# Patient Record
Sex: Female | Born: 1935 | Race: White | Hispanic: No | Smoking: Former smoker
Health system: Southern US, Community
[De-identification: ages and names within clinical notes are randomized; demographics above are authoritative.]

## PROBLEM LIST (undated history)

## (undated) DIAGNOSIS — B029 Zoster without complications: Secondary | ICD-10-CM

## (undated) DIAGNOSIS — M1990 Unspecified osteoarthritis, unspecified site: Secondary | ICD-10-CM

## (undated) DIAGNOSIS — J984 Other disorders of lung: Secondary | ICD-10-CM

## (undated) DIAGNOSIS — C50919 Malignant neoplasm of unspecified site of unspecified female breast: Secondary | ICD-10-CM

## (undated) DIAGNOSIS — S129XXA Fracture of neck, unspecified, initial encounter: Secondary | ICD-10-CM

## (undated) DIAGNOSIS — IMO0002 Reserved for concepts with insufficient information to code with codable children: Secondary | ICD-10-CM

## (undated) DIAGNOSIS — R351 Nocturia: Secondary | ICD-10-CM

## (undated) DIAGNOSIS — I10 Essential (primary) hypertension: Secondary | ICD-10-CM

## (undated) DIAGNOSIS — I739 Peripheral vascular disease, unspecified: Secondary | ICD-10-CM

## (undated) DIAGNOSIS — R32 Unspecified urinary incontinence: Secondary | ICD-10-CM

## (undated) DIAGNOSIS — J189 Pneumonia, unspecified organism: Secondary | ICD-10-CM

## (undated) DIAGNOSIS — I82402 Acute embolism and thrombosis of unspecified deep veins of left lower extremity: Secondary | ICD-10-CM

## (undated) DIAGNOSIS — R350 Frequency of micturition: Secondary | ICD-10-CM

## (undated) DIAGNOSIS — C189 Malignant neoplasm of colon, unspecified: Secondary | ICD-10-CM

## (undated) DIAGNOSIS — M8580 Other specified disorders of bone density and structure, unspecified site: Secondary | ICD-10-CM

## (undated) DIAGNOSIS — C73 Malignant neoplasm of thyroid gland: Secondary | ICD-10-CM

## (undated) DIAGNOSIS — E785 Hyperlipidemia, unspecified: Secondary | ICD-10-CM

## (undated) DIAGNOSIS — G629 Polyneuropathy, unspecified: Secondary | ICD-10-CM

## (undated) DIAGNOSIS — E039 Hypothyroidism, unspecified: Secondary | ICD-10-CM

## (undated) HISTORY — DX: Malignant neoplasm of thyroid gland: C73

## (undated) HISTORY — DX: Unspecified osteoarthritis, unspecified site: M19.90

## (undated) HISTORY — DX: Hyperlipidemia, unspecified: E78.5

## (undated) HISTORY — DX: Other specified disorders of bone density and structure, unspecified site: M85.80

## (undated) HISTORY — PX: OTHER SURGICAL HISTORY: SHX169

## (undated) HISTORY — PX: THYROIDECTOMY: SHX17

## (undated) HISTORY — DX: Malignant neoplasm of colon, unspecified: C18.9

## (undated) HISTORY — DX: Zoster without complications: B02.9

## (undated) HISTORY — DX: Polyneuropathy, unspecified: G62.9

## (undated) HISTORY — PX: BREAST ENHANCEMENT SURGERY: SHX7

## (undated) HISTORY — DX: Reserved for concepts with insufficient information to code with codable children: IMO0002

## (undated) HISTORY — DX: Acute embolism and thrombosis of unspecified deep veins of left lower extremity: I82.402

## (undated) HISTORY — PX: BREAST SURGERY: SHX581

## (undated) HISTORY — PX: CATARACT EXTRACTION: SUR2

---

## 1964-11-05 DIAGNOSIS — C73 Malignant neoplasm of thyroid gland: Secondary | ICD-10-CM

## 1964-11-05 HISTORY — DX: Malignant neoplasm of thyroid gland: C73

## 1966-11-05 HISTORY — PX: TOTAL ABDOMINAL HYSTERECTOMY W/ BILATERAL SALPINGOOPHORECTOMY: SHX83

## 1975-11-06 DIAGNOSIS — C189 Malignant neoplasm of colon, unspecified: Secondary | ICD-10-CM

## 1975-11-06 HISTORY — PX: COLON RESECTION: SHX5231

## 1975-11-06 HISTORY — DX: Malignant neoplasm of colon, unspecified: C18.9

## 1993-11-05 HISTORY — PX: CERVICAL LAMINECTOMY: SHX94

## 1998-03-07 ENCOUNTER — Ambulatory Visit (HOSPITAL_BASED_OUTPATIENT_CLINIC_OR_DEPARTMENT_OTHER): Admission: RE | Admit: 1998-03-07 | Discharge: 1998-03-07 | Payer: Self-pay | Admitting: General Surgery

## 1998-10-19 ENCOUNTER — Other Ambulatory Visit: Admission: RE | Admit: 1998-10-19 | Discharge: 1998-10-19 | Payer: Self-pay | Admitting: *Deleted

## 1998-11-03 ENCOUNTER — Ambulatory Visit (HOSPITAL_COMMUNITY): Admission: RE | Admit: 1998-11-03 | Discharge: 1998-11-03 | Payer: Self-pay | Admitting: *Deleted

## 1999-03-13 ENCOUNTER — Ambulatory Visit (HOSPITAL_COMMUNITY): Admission: RE | Admit: 1999-03-13 | Discharge: 1999-03-13 | Payer: Self-pay | Admitting: Gastroenterology

## 1999-05-25 ENCOUNTER — Other Ambulatory Visit: Admission: RE | Admit: 1999-05-25 | Discharge: 1999-05-25 | Payer: Self-pay

## 1999-06-28 ENCOUNTER — Ambulatory Visit (HOSPITAL_COMMUNITY): Admission: RE | Admit: 1999-06-28 | Discharge: 1999-06-28 | Payer: Self-pay | Admitting: *Deleted

## 1999-10-26 ENCOUNTER — Other Ambulatory Visit: Admission: RE | Admit: 1999-10-26 | Discharge: 1999-10-26 | Payer: Self-pay | Admitting: *Deleted

## 1999-11-16 ENCOUNTER — Ambulatory Visit (HOSPITAL_COMMUNITY): Admission: RE | Admit: 1999-11-16 | Discharge: 1999-11-16 | Payer: Self-pay | Admitting: *Deleted

## 2000-12-10 ENCOUNTER — Encounter: Payer: Self-pay | Admitting: Internal Medicine

## 2000-12-10 ENCOUNTER — Ambulatory Visit (HOSPITAL_COMMUNITY): Admission: RE | Admit: 2000-12-10 | Discharge: 2000-12-10 | Payer: Self-pay | Admitting: Internal Medicine

## 2001-04-01 ENCOUNTER — Ambulatory Visit (HOSPITAL_COMMUNITY): Admission: RE | Admit: 2001-04-01 | Discharge: 2001-04-01 | Payer: Self-pay | Admitting: Gastroenterology

## 2002-05-21 ENCOUNTER — Ambulatory Visit (HOSPITAL_COMMUNITY): Admission: RE | Admit: 2002-05-21 | Discharge: 2002-05-21 | Payer: Self-pay | Admitting: Internal Medicine

## 2002-05-21 ENCOUNTER — Encounter: Payer: Self-pay | Admitting: Internal Medicine

## 2004-04-13 ENCOUNTER — Ambulatory Visit (HOSPITAL_COMMUNITY): Admission: RE | Admit: 2004-04-13 | Discharge: 2004-04-13 | Payer: Self-pay | Admitting: Internal Medicine

## 2005-04-16 ENCOUNTER — Ambulatory Visit (HOSPITAL_COMMUNITY): Admission: RE | Admit: 2005-04-16 | Discharge: 2005-04-16 | Payer: Self-pay | Admitting: Internal Medicine

## 2005-04-23 ENCOUNTER — Encounter: Admission: RE | Admit: 2005-04-23 | Discharge: 2005-04-23 | Payer: Self-pay | Admitting: Internal Medicine

## 2006-04-25 ENCOUNTER — Ambulatory Visit (HOSPITAL_COMMUNITY): Admission: RE | Admit: 2006-04-25 | Discharge: 2006-04-25 | Payer: Self-pay | Admitting: Internal Medicine

## 2007-01-06 ENCOUNTER — Ambulatory Visit (HOSPITAL_COMMUNITY): Admission: RE | Admit: 2007-01-06 | Discharge: 2007-01-06 | Payer: Self-pay | Admitting: Internal Medicine

## 2009-10-20 ENCOUNTER — Emergency Department (HOSPITAL_COMMUNITY): Admission: EM | Admit: 2009-10-20 | Discharge: 2009-10-20 | Payer: Self-pay | Admitting: Emergency Medicine

## 2010-07-19 ENCOUNTER — Encounter: Admission: RE | Admit: 2010-07-19 | Discharge: 2010-07-19 | Payer: Self-pay | Admitting: Internal Medicine

## 2010-11-26 ENCOUNTER — Encounter: Payer: Self-pay | Admitting: Internal Medicine

## 2011-01-18 ENCOUNTER — Emergency Department (HOSPITAL_COMMUNITY): Payer: Medicare Other

## 2011-01-18 ENCOUNTER — Emergency Department (HOSPITAL_COMMUNITY)
Admission: EM | Admit: 2011-01-18 | Discharge: 2011-01-18 | Disposition: A | Discharge status: Home / Self Care | Payer: Medicare Other | Attending: Emergency Medicine | Admitting: Emergency Medicine

## 2011-01-18 DIAGNOSIS — M25559 Pain in unspecified hip: Secondary | ICD-10-CM | POA: Insufficient documentation

## 2011-01-18 DIAGNOSIS — S42209B Unspecified fracture of upper end of unspecified humerus, initial encounter for open fracture: Secondary | ICD-10-CM | POA: Insufficient documentation

## 2011-01-18 DIAGNOSIS — E860 Dehydration: Secondary | ICD-10-CM | POA: Insufficient documentation

## 2011-01-18 DIAGNOSIS — I10 Essential (primary) hypertension: Secondary | ICD-10-CM | POA: Insufficient documentation

## 2011-01-18 DIAGNOSIS — W010XXA Fall on same level from slipping, tripping and stumbling without subsequent striking against object, initial encounter: Secondary | ICD-10-CM | POA: Insufficient documentation

## 2011-01-18 DIAGNOSIS — S9000XA Contusion of unspecified ankle, initial encounter: Secondary | ICD-10-CM | POA: Insufficient documentation

## 2011-01-18 DIAGNOSIS — E7800 Pure hypercholesterolemia, unspecified: Secondary | ICD-10-CM | POA: Insufficient documentation

## 2011-01-18 DIAGNOSIS — Y92009 Unspecified place in unspecified non-institutional (private) residence as the place of occurrence of the external cause: Secondary | ICD-10-CM | POA: Insufficient documentation

## 2011-01-18 LAB — CK: Total CK: 79 U/L (ref 7–177)

## 2011-01-18 LAB — BASIC METABOLIC PANEL
CO2: 25 mEq/L (ref 19–32)
Calcium: 9 mg/dL (ref 8.4–10.5)
GFR calc Af Amer: 58 mL/min — ABNORMAL LOW (ref >60)
Glucose, Bld: 135 mg/dL — ABNORMAL HIGH (ref 70–99)

## 2011-01-18 LAB — DIFFERENTIAL
Eosinophils Absolute: 0 10*3/uL (ref 0.0–0.7)
Monocytes Relative: 13 % — ABNORMAL HIGH (ref 3–12)
Neutrophils Relative %: 69 % (ref 43–77)

## 2011-01-18 LAB — URINALYSIS, ROUTINE W REFLEX MICROSCOPIC
Hgb urine dipstick: NEGATIVE
Protein, ur: 30 mg/dL — AB
Specific Gravity, Urine: 1.025 (ref 1.005–1.030)

## 2011-01-18 LAB — URINE MICROSCOPIC-ADD ON

## 2011-01-18 LAB — CBC
MCH: 29.8 pg (ref 26.0–34.0)
MCV: 88.9 fL (ref 78.0–100.0)
Platelets: 324 10*3/uL (ref 150–400)

## 2011-03-23 NOTE — Procedures (Signed)
[place]  Patient:    Cassandra Walker, Cassandra Walker                      MRN: [id number] Proc. Date: 04/01/01 Adm. Date:  [id number] Attending:  Orland Mustard CC:         Marinus Maw, M.D.   Procedure Report  PROCEDURE PERFORMED:  Colonoscopy.  ENDOSCOPIST:  Llana Aliment. Randa Evens, M.D.  MEDICATIONS USED:  Fentanyl 40 mcg, Versed 4 mg IV.  INSTRUMENT:  Olympus pediatric video colonoscope.  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  She has also had adenomatous polyps.  She was due for a three-year follow-up next year but had a complete physical exam with stool positive for blood.  For this reason, colonoscopy was performed.  DESCRIPTION OF PROCEDURE:  The procedure had been explained to the patient and consent obtained.  With the patient in the left lateral decubitus position, the Olympus video colonoscope was inserted and advanced under direct visualization.  The patient had an anastomosis in the high rectum that was somewhat a side-to-side anastomosis and took some time to pass.  Once we were able pass this, we were able to advance easily to the cecum.  The ileocecal valve and appendiceal orifice were seen.  The scope was withdrawn.  The cecum, ascending colon, hepatic flexure, transverse colon, splenic flexure, descending and sigmoid colon were seen well upon withdrawal.  No polyps or other lesions were seen.  The anastomosis was normal.  The rectum was free of polyps or other lesions.  The patient tolerated the procedure well. Maintained on low flow oxygen and pulse oximeter throughout the procedure with no obvious problem.  ASSESSMENT: 1. Heme positive stool. 2. No evidence of further polyps.  PLAN:  Will recommend repeating in three years.  Will see back in the office in three months and will check her stool. DD:  04/01/01 TD:  04/01/01 Job:  [id number]

## 2011-06-28 ENCOUNTER — Other Ambulatory Visit: Payer: Self-pay | Admitting: Internal Medicine

## 2011-06-28 DIAGNOSIS — R9389 Abnormal findings on diagnostic imaging of other specified body structures: Secondary | ICD-10-CM

## 2011-07-02 ENCOUNTER — Ambulatory Visit
Admission: RE | Admit: 2011-07-02 | Discharge: 2011-07-02 | Disposition: A | Discharge status: Home / Self Care | Payer: Medicare Other | Source: Ambulatory Visit | Attending: Internal Medicine | Admitting: Internal Medicine

## 2011-07-02 DIAGNOSIS — R9389 Abnormal findings on diagnostic imaging of other specified body structures: Secondary | ICD-10-CM

## 2011-07-02 MED ORDER — IOHEXOL 300 MG/ML  SOLN: 75.0000 mL | Freq: Once | INTRAMUSCULAR | Status: AC | PRN

## 2011-07-23 ENCOUNTER — Encounter: Payer: Self-pay | Admitting: Pulmonary Disease

## 2011-07-24 ENCOUNTER — Encounter: Payer: Self-pay | Admitting: Pulmonary Disease

## 2011-07-24 ENCOUNTER — Ambulatory Visit (INDEPENDENT_AMBULATORY_CARE_PROVIDER_SITE_OTHER): Payer: Medicare Other | Admitting: Pulmonary Disease

## 2011-07-24 VITALS — BP 140/72 | HR 69 | Temp 98.0°F | Ht 65.5 in | Wt 161.6 lb

## 2011-07-24 DIAGNOSIS — R911 Solitary pulmonary nodule: Secondary | ICD-10-CM

## 2011-07-24 DIAGNOSIS — J984 Other disorders of lung: Secondary | ICD-10-CM

## 2011-07-24 DIAGNOSIS — R918 Other nonspecific abnormal finding of lung field: Secondary | ICD-10-CM | POA: Insufficient documentation

## 2011-07-24 NOTE — Assessment & Plan Note (Signed)
The patient has a groundglass density in the superior segment of the right lower lobe that has slightly enlarged from last year.  It is unclear whether this is an inflammatory focus, possible infection such as MAC, or whether this may be a slow-growing malignancy.  She did smoke one pack per day for 20 years.  At this point, I think she needs a PET scan for better evaluation, followed by bronchoscopy.  I have discussed all of this with the patient, and she is agreeable to the plan outlined.

## 2011-07-24 NOTE — Patient Instructions (Signed)
Will schedule for PET scan, and will call with the results After the PET, will arrange for bronchoscopy to further evaluate the abnormality in your lung.

## 2011-07-24 NOTE — Progress Notes (Signed)
  Subjective:    Patient ID: Cassandra Walker, female    DOB: Oct 10, 1932, 77 y.o.   MRN: [id number]  HPI The pt is a 77y/o female who I have been asked to see for an abnormal ct chest.  She had a ct chest in 2011 which showed an ill-defined area of groundglass opacity along the superior segment of the right lower lobe bronchus.  The patient had a bronchitis at that time, and once this resolved did not feel that she needed a followup.  She has had a followup scan this year, but it appears the area in question has grown slightly.  There are no other new findings.  The patient denies any cough or congestion at this time, no chest pain, no anorexia or weight loss, and no hemoptysis.  She has no history of TB exposure, and has had a negative PPD in the past.  She has never lived in [place] or [place].  She does have a history of colon cancer with resection in 1977, as well as a thyroidectomy for thyroid cancer in 1968.  She tells me there has been no evidence for recurrence over this time.  She also tells me that her health maintenance is completely up to date.   Review of Systems  Constitutional: Negative for fever and unexpected weight change.  HENT: Negative for ear pain, nosebleeds, congestion, sore throat, rhinorrhea, sneezing, trouble swallowing, dental problem, postnasal drip and sinus pressure.   Eyes: Negative for redness and itching.  Respiratory: Positive for shortness of breath. Negative for cough, chest tightness and wheezing.   Cardiovascular: Negative for palpitations and leg swelling.  Gastrointestinal: Negative for nausea and vomiting.  Genitourinary: Negative for dysuria.  Musculoskeletal: Negative for joint swelling.  Skin: Negative for rash.  Neurological: Negative for headaches.  Hematological: Does not bruise/bleed easily.  Psychiatric/Behavioral: Negative for dysphoric mood. The patient is not nervous/anxious.        Objective:   Physical Exam Constitutional:  Well  developed, no acute distress  HENT:  Nares patent without discharge  Oropharynx without exudate, palate and uvula are normal  Eyes:  Perrla, eomi, no scleral icterus  Neck:  No JVD, no TMG  Cardiovascular:  Normal rate, regular rhythm, no rubs or gallops.  No murmurs        Intact distal pulses  Pulmonary :  Normal breath sounds, no stridor or respiratory distress   No rales, rhonchi, or wheezing  Abdominal:  Soft, nondistended, bowel sounds present.  No tenderness noted.   Musculoskeletal:  No lower extremity edema noted.  Lymph Nodes:  No cervical lymphadenopathy noted  Skin:  No cyanosis noted  Neurologic:  Alert, appropriate, moves all 4 extremities without obvious deficit.         Assessment & Plan:

## 2011-07-30 ENCOUNTER — Encounter (HOSPITAL_COMMUNITY)
Admission: RE | Admit: 2011-07-30 | Discharge: 2011-07-30 | Disposition: A | Discharge status: Home / Self Care | Payer: Medicare Other | Source: Ambulatory Visit | Attending: Pulmonary Disease | Admitting: Pulmonary Disease

## 2011-07-30 DIAGNOSIS — R911 Solitary pulmonary nodule: Secondary | ICD-10-CM

## 2011-07-30 DIAGNOSIS — J984 Other disorders of lung: Secondary | ICD-10-CM | POA: Insufficient documentation

## 2011-07-30 MED ORDER — FLUDEOXYGLUCOSE F - 18 (FDG) INJECTION
18.3000 | Freq: Once | INTRAVENOUS | Status: AC | PRN
  Administered 2011-07-30: 18.3 via INTRAVENOUS

## 2011-08-07 ENCOUNTER — Telehealth: Payer: Self-pay | Admitting: Pulmonary Disease

## 2011-08-07 NOTE — Telephone Encounter (Signed)
Called and spoke with Cassandra Walker at [employer].  Pt scheduled for bronch 08/14/11 at 7:30 am.    Called and spoke with pt.  Pt aware of appt date and time of bronch, to arrive at 6:15am at [facility] outpt admitting, NPO after midnight, no aleve 1 day prior to procedure, and will need someone with her to drive her home.  Pt verbalized understanding and denied any questions.    Also mailed her the bronch paper with all this info in it.

## 2011-08-07 NOTE — Telephone Encounter (Signed)
Megan, please arrange bronch for this pt for next week.  Let me know. Thanks.

## 2011-08-14 ENCOUNTER — Other Ambulatory Visit (HOSPITAL_COMMUNITY): Payer: Self-pay | Admitting: Pulmonary Disease

## 2011-08-14 ENCOUNTER — Other Ambulatory Visit: Payer: Self-pay | Admitting: Pulmonary Disease

## 2011-08-14 ENCOUNTER — Ambulatory Visit (HOSPITAL_COMMUNITY)
Admission: RE | Admit: 2011-08-14 | Discharge: 2011-08-14 | Disposition: A | Discharge status: Home / Self Care | Payer: Medicare Other | Source: Ambulatory Visit | Attending: Pulmonary Disease | Admitting: Pulmonary Disease

## 2011-08-14 DIAGNOSIS — R9389 Abnormal findings on diagnostic imaging of other specified body structures: Secondary | ICD-10-CM

## 2011-08-14 DIAGNOSIS — R222 Localized swelling, mass and lump, trunk: Secondary | ICD-10-CM | POA: Insufficient documentation

## 2011-08-14 DIAGNOSIS — J984 Other disorders of lung: Secondary | ICD-10-CM | POA: Insufficient documentation

## 2011-08-15 ENCOUNTER — Other Ambulatory Visit: Payer: Self-pay | Admitting: Pulmonary Disease

## 2011-08-15 DIAGNOSIS — R911 Solitary pulmonary nodule: Secondary | ICD-10-CM

## 2011-08-16 LAB — CULTURE, RESPIRATORY W GRAM STAIN

## 2011-08-20 NOTE — Op Note (Signed)
  [name], [name] NO.:  [id number]  MEDICAL RECORD NO.:  [id number]  LOCATION:  RESP                         FACILITY:  [facility]  PHYSICIAN:  Barbaraann Share, MD,FCCPDATE OF BIRTH:  Aug 13, 1932  DATE OF PROCEDURE:  08/14/2011 DATE OF DISCHARGE:                              OPERATIVE REPORT   PROCEDURE:  Flexible fiberoptic bronchoscopy with video.  INDICATION FOR THE PROCEDURE:  Right lower lobe nodular mass and also right upper lobe abnormality of unknown origin.  OPERATOR:  Barbaraann Share, MD,FCCP  ANESTHESIA:  Versed 7 mg IV, Demerol 50 mg IV, topical 1% lidocaine on vocal cords and airways during the procedure.  DESCRIPTION:  After obtaining informed consent and under close cardiopulmonary monitoring, the above preop anesthesia was given.  The fiberoptic scope was passed to the right naris and into the posterior pharynx where there was no lesions or other abnormalities seen.  The vocal cords appeared to be within normal limits and moved bilaterally on phonation.  The scope was then passed into the trachea where it was examined along its entire length down to the level of the carina, all of which appeared normal.  The left tracheobronchial tree was examined to the subsegmental level with no endobronchial abnormality being found. Attention was then paid to the right tracheobronchial tree where the right upper lobe, right middle lobe, and right lower lobe bronchi all appeared patent and without abnormality.  Attention was then paid to the superior segment of the right lower lobe where bronchial brushes, bronchial alveolar lavage, as well as transbronchial biopsies were done with good material being obtained.  The scope was then passed into the right upper lobe bronchus and bronchial brushes were taken from the apical segment of the right upper lobe without complications.  The patient did well with the procedure with no complications noted.  Chest x-ray  was pending at time of dictation to rule out pneumothorax post transbronchial lung biopsies.     Barbaraann Share, MD,FCCP     [facility]/MEDQ  D:  08/14/2011  T:  08/14/2011  Job:  [id number]  Electronically Signed by Marcelyn Bruins MDFCCP on 08/20/2011 01:21:54 PM

## 2011-09-10 LAB — FUNGUS CULTURE W SMEAR

## 2012-01-17 ENCOUNTER — Other Ambulatory Visit (HOSPITAL_COMMUNITY): Payer: Self-pay | Admitting: Internal Medicine

## 2012-01-17 ENCOUNTER — Ambulatory Visit (HOSPITAL_COMMUNITY)
Admission: RE | Admit: 2012-01-17 | Discharge: 2012-01-17 | Disposition: A | Discharge status: Home / Self Care | Payer: Medicare Other | Source: Ambulatory Visit | Attending: Internal Medicine | Admitting: Internal Medicine

## 2012-01-17 DIAGNOSIS — M25559 Pain in unspecified hip: Secondary | ICD-10-CM

## 2012-01-17 DIAGNOSIS — M545 Low back pain, unspecified: Secondary | ICD-10-CM | POA: Insufficient documentation

## 2012-01-17 DIAGNOSIS — M2550 Pain in unspecified joint: Secondary | ICD-10-CM | POA: Insufficient documentation

## 2012-08-12 ENCOUNTER — Other Ambulatory Visit (HOSPITAL_COMMUNITY): Payer: Self-pay | Admitting: Internal Medicine

## 2012-08-12 ENCOUNTER — Ambulatory Visit (HOSPITAL_COMMUNITY)
Admission: RE | Admit: 2012-08-12 | Discharge: 2012-08-12 | Disposition: A | Discharge status: Home / Self Care | Payer: Medicare Other | Source: Ambulatory Visit | Attending: Internal Medicine | Admitting: Internal Medicine

## 2012-08-12 DIAGNOSIS — R071 Chest pain on breathing: Secondary | ICD-10-CM

## 2012-08-12 DIAGNOSIS — I10 Essential (primary) hypertension: Secondary | ICD-10-CM | POA: Insufficient documentation

## 2012-08-18 ENCOUNTER — Telehealth: Payer: Self-pay | Admitting: Pulmonary Disease

## 2012-08-18 NOTE — Telephone Encounter (Signed)
Called # listed and off is closed. Will call back in the AM

## 2012-08-19 NOTE — Telephone Encounter (Signed)
I spoke with Cassandra Walker and advised her we have not seen pt since 08/2011 after her bronch. Nothing further was needed

## 2012-10-16 ENCOUNTER — Ambulatory Visit
Admission: RE | Admit: 2012-10-16 | Discharge: 2012-10-16 | Disposition: A | Discharge status: Home / Self Care | Payer: Medicare Other | Source: Ambulatory Visit | Attending: Internal Medicine | Admitting: Internal Medicine

## 2012-10-16 ENCOUNTER — Other Ambulatory Visit: Payer: Self-pay | Admitting: Internal Medicine

## 2012-10-16 DIAGNOSIS — M542 Cervicalgia: Secondary | ICD-10-CM

## 2013-03-07 ENCOUNTER — Emergency Department (HOSPITAL_COMMUNITY): Payer: Medicare Other

## 2013-03-07 ENCOUNTER — Encounter (HOSPITAL_COMMUNITY): Payer: Self-pay | Admitting: Anesthesiology

## 2013-03-07 ENCOUNTER — Inpatient Hospital Stay (HOSPITAL_COMMUNITY)
Admission: EM | Admit: 2013-03-07 | Discharge: 2013-03-17 | DRG: 030 | Disposition: A | Discharge status: Rehabilitation Facility | Payer: Medicare Other | Attending: General Surgery | Admitting: General Surgery

## 2013-03-07 ENCOUNTER — Encounter (HOSPITAL_COMMUNITY): Admission: EM | Disposition: A | Discharge status: Rehabilitation Facility | Payer: Self-pay | Source: Home / Self Care

## 2013-03-07 ENCOUNTER — Emergency Department (HOSPITAL_COMMUNITY): Payer: Medicare Other | Admitting: Anesthesiology

## 2013-03-07 ENCOUNTER — Inpatient Hospital Stay (HOSPITAL_COMMUNITY): Payer: Medicare Other

## 2013-03-07 ENCOUNTER — Encounter (HOSPITAL_COMMUNITY): Payer: Self-pay

## 2013-03-07 DIAGNOSIS — G8254 Quadriplegia, C5-C7 incomplete: Secondary | ICD-10-CM | POA: Diagnosis present

## 2013-03-07 DIAGNOSIS — I10 Essential (primary) hypertension: Secondary | ICD-10-CM | POA: Diagnosis present

## 2013-03-07 DIAGNOSIS — Y999 Unspecified external cause status: Secondary | ICD-10-CM

## 2013-03-07 DIAGNOSIS — IMO0002 Reserved for concepts with insufficient information to code with codable children: Principal | ICD-10-CM | POA: Diagnosis present

## 2013-03-07 DIAGNOSIS — S129XXA Fracture of neck, unspecified, initial encounter: Secondary | ICD-10-CM

## 2013-03-07 DIAGNOSIS — S12500A Unspecified displaced fracture of sixth cervical vertebra, initial encounter for closed fracture: Secondary | ICD-10-CM

## 2013-03-07 DIAGNOSIS — E039 Hypothyroidism, unspecified: Secondary | ICD-10-CM | POA: Diagnosis present

## 2013-03-07 DIAGNOSIS — R339 Retention of urine, unspecified: Secondary | ICD-10-CM | POA: Diagnosis present

## 2013-03-07 DIAGNOSIS — S14109A Unspecified injury at unspecified level of cervical spinal cord, initial encounter: Secondary | ICD-10-CM

## 2013-03-07 DIAGNOSIS — E876 Hypokalemia: Secondary | ICD-10-CM | POA: Diagnosis present

## 2013-03-07 DIAGNOSIS — S14121A Central cord syndrome at C1 level of cervical spinal cord, initial encounter: Secondary | ICD-10-CM | POA: Diagnosis present

## 2013-03-07 HISTORY — PX: POSTERIOR CERVICAL FUSION/FORAMINOTOMY: SHX5038

## 2013-03-07 HISTORY — DX: Peripheral vascular disease, unspecified: I73.9

## 2013-03-07 LAB — COMPREHENSIVE METABOLIC PANEL
Albumin: 3.8 g/dL (ref 3.5–5.2)
Albumin: 3.2 g/dL — ABNORMAL LOW (ref 3.5–5.2)
Alkaline Phosphatase: 61 U/L (ref 39–117)
BUN: 29 mg/dL — ABNORMAL HIGH (ref 6–23)
BUN: 25 mg/dL — ABNORMAL HIGH (ref 6–23)
Calcium: 9.1 mg/dL (ref 8.4–10.5)
GFR calc Af Amer: 42 mL/min — ABNORMAL LOW (ref >90)
Glucose, Bld: 130 mg/dL — ABNORMAL HIGH (ref 70–99)
Potassium: 3.8 mEq/L (ref 3.5–5.1)
Sodium: 137 mEq/L (ref 135–145)
Total Protein: 6.9 g/dL (ref 6.0–8.3)
Total Protein: 6.2 g/dL (ref 6.0–8.3)

## 2013-03-07 LAB — CBC WITH DIFFERENTIAL/PLATELET
Basophils Absolute: 0 10*3/uL (ref 0.0–0.1)
Basophils Relative: 0 % (ref 0–1)
Eosinophils Absolute: 0 10*3/uL (ref 0.0–0.7)
HCT: 35.5 % — ABNORMAL LOW (ref 36.0–46.0)
Hemoglobin: 12.4 g/dL (ref 12.0–15.0)
MCH: 29.2 pg (ref 26.0–34.0)
MCHC: 34.9 g/dL (ref 30.0–36.0)
Monocytes Absolute: 1.4 10*3/uL — ABNORMAL HIGH (ref 0.1–1.0)
Neutro Abs: 16.6 10*3/uL — ABNORMAL HIGH (ref 1.7–7.7)
Neutrophils Relative %: 82 % — ABNORMAL HIGH (ref 43–77)
RDW: 14.4 % (ref 11.5–15.5)

## 2013-03-07 LAB — CBC
MCHC: 34.5 g/dL (ref 30.0–36.0)
RDW: 14.5 % (ref 11.5–15.5)

## 2013-03-07 LAB — MRSA PCR SCREENING: MRSA by PCR: NEGATIVE

## 2013-03-07 LAB — PROTIME-INR: Prothrombin Time: 13 seconds (ref 11.6–15.2)

## 2013-03-07 SURGERY — POSTERIOR CERVICAL FUSION/FORAMINOTOMY LEVEL 1
Anesthesia: General | Site: Neck | Wound class: Clean

## 2013-03-07 MED ORDER — ONDANSETRON HCL 4 MG/2ML IJ SOLN
4.0000 mg | Freq: Four times a day (QID) | INTRAMUSCULAR | Status: DC | PRN
  Administered 2013-03-07 – 2013-03-11 (×9): 4 mg via INTRAVENOUS
  Filled 2013-03-07 (×9): qty 2

## 2013-03-07 MED ORDER — SODIUM CHLORIDE 0.9 % IV SOLN
INTRAVENOUS | Status: AC
  Filled 2013-03-07: qty 500

## 2013-03-07 MED ORDER — PANTOPRAZOLE SODIUM 40 MG PO TBEC
40.0000 mg | DELAYED_RELEASE_TABLET | Freq: Every day | ORAL | Status: DC
  Administered 2013-03-11 – 2013-03-15 (×5): 40 mg via ORAL
  Filled 2013-03-07 (×5): qty 1

## 2013-03-07 MED ORDER — PANTOPRAZOLE SODIUM 40 MG IV SOLR
40.0000 mg | Freq: Every day | INTRAVENOUS | Status: DC
  Administered 2013-03-08 – 2013-03-10 (×3): 40 mg via INTRAVENOUS
  Filled 2013-03-07 (×6): qty 40

## 2013-03-07 MED ORDER — BIOTENE DRY MOUTH MT LIQD
15.0000 mL | Freq: Two times a day (BID) | OROMUCOSAL | Status: DC
  Administered 2013-03-08 – 2013-03-12 (×8): 15 mL via OROMUCOSAL

## 2013-03-07 MED ORDER — LIDOCAINE-EPINEPHRINE 1 %-1:100000 IJ SOLN
INTRAMUSCULAR | Status: DC | PRN
  Administered 2013-03-07: 10 mL via INTRADERMAL

## 2013-03-07 MED ORDER — POTASSIUM CHLORIDE IN NACL 20-0.9 MEQ/L-% IV SOLN
INTRAVENOUS | Status: DC
  Administered 2013-03-07 – 2013-03-10 (×5): via INTRAVENOUS
  Administered 2013-03-11: 1000 mL via INTRAVENOUS
  Administered 2013-03-12 – 2013-03-14 (×3): via INTRAVENOUS
  Filled 2013-03-07 (×14): qty 1000

## 2013-03-07 MED ORDER — LACTATED RINGERS IV SOLN
INTRAVENOUS | Status: DC | PRN
  Administered 2013-03-07 (×2): via INTRAVENOUS

## 2013-03-07 MED ORDER — EPHEDRINE SULFATE 50 MG/ML IJ SOLN
INTRAMUSCULAR | Status: DC | PRN
  Administered 2013-03-07: 10 mg via INTRAVENOUS

## 2013-03-07 MED ORDER — VECURONIUM BROMIDE 10 MG IV SOLR
INTRAVENOUS | Status: DC | PRN
  Administered 2013-03-07: 3 mg via INTRAVENOUS
  Administered 2013-03-07: 10 mg via INTRAVENOUS
  Administered 2013-03-07: 1 mg via INTRAVENOUS

## 2013-03-07 MED ORDER — HYDROMORPHONE HCL PF 1 MG/ML IJ SOLN
0.2500 mg | INTRAMUSCULAR | Status: DC | PRN
  Administered 2013-03-07 (×3): 0.5 mg via INTRAVENOUS

## 2013-03-07 MED ORDER — ONDANSETRON HCL 4 MG/2ML IJ SOLN
INTRAMUSCULAR | Status: DC | PRN
  Administered 2013-03-07: 4 mg via INTRAVENOUS

## 2013-03-07 MED ORDER — SUCCINYLCHOLINE CHLORIDE 20 MG/ML IJ SOLN
INTRAMUSCULAR | Status: DC | PRN
  Administered 2013-03-07: 100 mg via INTRAVENOUS

## 2013-03-07 MED ORDER — HYDROMORPHONE HCL PF 1 MG/ML IJ SOLN
INTRAMUSCULAR | Status: AC
  Filled 2013-03-07: qty 1

## 2013-03-07 MED ORDER — ONDANSETRON HCL 4 MG/2ML IJ SOLN: 4.0000 mg | Freq: Four times a day (QID) | INTRAMUSCULAR | Status: DC | PRN

## 2013-03-07 MED ORDER — SODIUM CHLORIDE 0.9 % IR SOLN
Status: DC | PRN
  Administered 2013-03-07: 18:00:00

## 2013-03-07 MED ORDER — LEVOTHYROXINE SODIUM 100 MCG IV SOLR
50.0000 ug | Freq: Every day | INTRAVENOUS | Status: DC
  Administered 2013-03-08 – 2013-03-09 (×2): 50 ug via INTRAVENOUS
  Filled 2013-03-07 (×4): qty 5

## 2013-03-07 MED ORDER — CEFAZOLIN SODIUM-DEXTROSE 2-3 GM-% IV SOLR
INTRAVENOUS | Status: AC
  Administered 2013-03-07: 2 g via INTRAVENOUS
  Filled 2013-03-07: qty 50

## 2013-03-07 MED ORDER — GLYCOPYRROLATE 0.2 MG/ML IJ SOLN
INTRAMUSCULAR | Status: DC | PRN
  Administered 2013-03-07: 0.6 mg via INTRAVENOUS

## 2013-03-07 MED ORDER — CEFAZOLIN SODIUM 1-5 GM-% IV SOLN
1.0000 g | Freq: Three times a day (TID) | INTRAVENOUS | Status: DC
  Administered 2013-03-07 – 2013-03-12 (×14): 1 g via INTRAVENOUS
  Filled 2013-03-07 (×16): qty 50

## 2013-03-07 MED ORDER — OXYCODONE HCL 5 MG PO TABS: 5.0000 mg | ORAL_TABLET | Freq: Once | ORAL | Status: DC | PRN

## 2013-03-07 MED ORDER — DEXAMETHASONE SODIUM PHOSPHATE 4 MG/ML IJ SOLN
INTRAMUSCULAR | Status: DC | PRN
  Administered 2013-03-07: 10 mg via INTRAVENOUS

## 2013-03-07 MED ORDER — LIDOCAINE HCL (CARDIAC) 20 MG/ML IV SOLN
INTRAVENOUS | Status: DC | PRN
  Administered 2013-03-07: 80 mg via INTRAVENOUS

## 2013-03-07 MED ORDER — 0.9 % SODIUM CHLORIDE (POUR BTL) OPTIME
TOPICAL | Status: DC | PRN
  Administered 2013-03-07: 1000 mL

## 2013-03-07 MED ORDER — THROMBIN 20000 UNITS EX KIT
PACK | CUTANEOUS | Status: DC | PRN
  Administered 2013-03-07: 18:00:00 via TOPICAL

## 2013-03-07 MED ORDER — ONDANSETRON HCL 4 MG PO TABS
4.0000 mg | ORAL_TABLET | Freq: Four times a day (QID) | ORAL | Status: DC | PRN
  Administered 2013-03-17: 4 mg via ORAL
  Filled 2013-03-07: qty 1

## 2013-03-07 MED ORDER — PNEUMOCOCCAL VAC POLYVALENT 25 MCG/0.5ML IJ INJ
0.5000 mL | INJECTION | INTRAMUSCULAR | Status: AC
  Filled 2013-03-07: qty 0.5

## 2013-03-07 MED ORDER — MORPHINE SULFATE 2 MG/ML IJ SOLN
1.0000 mg | INTRAMUSCULAR | Status: DC | PRN
  Administered 2013-03-09 – 2013-03-11 (×4): 1 mg via INTRAVENOUS
  Filled 2013-03-07 (×5): qty 1

## 2013-03-07 MED ORDER — MORPHINE SULFATE 4 MG/ML IJ SOLN
4.0000 mg | INTRAMUSCULAR | Status: DC | PRN
  Administered 2013-03-12 – 2013-03-13 (×6): 4 mg via INTRAVENOUS
  Filled 2013-03-07 (×6): qty 1

## 2013-03-07 MED ORDER — PROPOFOL 10 MG/ML IV BOLUS
INTRAVENOUS | Status: DC | PRN
  Administered 2013-03-07: 150 mg via INTRAVENOUS

## 2013-03-07 MED ORDER — THROMBIN 5000 UNITS EX SOLR
OROMUCOSAL | Status: DC | PRN
  Administered 2013-03-07: 19:00:00 via TOPICAL

## 2013-03-07 MED ORDER — MORPHINE SULFATE 2 MG/ML IJ SOLN
2.0000 mg | INTRAMUSCULAR | Status: DC | PRN
  Administered 2013-03-07 – 2013-03-14 (×9): 2 mg via INTRAVENOUS
  Filled 2013-03-07 (×8): qty 1

## 2013-03-07 MED ORDER — FENTANYL CITRATE 0.05 MG/ML IJ SOLN
INTRAMUSCULAR | Status: DC | PRN
  Administered 2013-03-07: 150 ug via INTRAVENOUS
  Administered 2013-03-07: 100 ug via INTRAVENOUS

## 2013-03-07 MED ORDER — DEXAMETHASONE SODIUM PHOSPHATE 4 MG/ML IJ SOLN
8.0000 mg | Freq: Four times a day (QID) | INTRAMUSCULAR | Status: DC
  Administered 2013-03-07 – 2013-03-08 (×2): 8 mg via INTRAVENOUS
  Filled 2013-03-07 (×4): qty 2

## 2013-03-07 MED ORDER — METOPROLOL TARTRATE 1 MG/ML IV SOLN
5.0000 mg | Freq: Three times a day (TID) | INTRAVENOUS | Status: DC
  Administered 2013-03-07 – 2013-03-11 (×10): 5 mg via INTRAVENOUS
  Filled 2013-03-07 (×18): qty 5

## 2013-03-07 MED ORDER — OXYCODONE HCL 5 MG/5ML PO SOLN: 5.0000 mg | Freq: Once | ORAL | Status: DC | PRN

## 2013-03-07 MED ORDER — BACITRACIN 50000 UNITS IM SOLR
INTRAMUSCULAR | Status: AC
  Filled 2013-03-07: qty 1

## 2013-03-07 MED ORDER — METOPROLOL TARTRATE 1 MG/ML IV SOLN
5.0000 mg | Freq: Four times a day (QID) | INTRAVENOUS | Status: DC | PRN
  Administered 2013-03-09: 5 mg via INTRAVENOUS
  Filled 2013-03-07: qty 5

## 2013-03-07 MED ORDER — DEXAMETHASONE SODIUM PHOSPHATE 10 MG/ML IJ SOLN
10.0000 mg | Freq: Once | INTRAMUSCULAR | Status: AC
  Administered 2013-03-07: 10 mg via INTRAVENOUS
  Filled 2013-03-07: qty 1

## 2013-03-07 MED ORDER — LACTATED RINGERS IV SOLN
INTRAVENOUS | Status: DC | PRN
  Administered 2013-03-07 (×2): via INTRAVENOUS

## 2013-03-07 MED ORDER — NEOSTIGMINE METHYLSULFATE 1 MG/ML IJ SOLN
INTRAMUSCULAR | Status: DC | PRN
  Administered 2013-03-07: 4 mg via INTRAVENOUS

## 2013-03-07 MED ORDER — HYDROMORPHONE HCL PF 1 MG/ML IJ SOLN
0.5000 mg | Freq: Once | INTRAMUSCULAR | Status: AC
  Administered 2013-03-07: 0.5 mg via INTRAVENOUS
  Filled 2013-03-07: qty 1

## 2013-03-07 SURGICAL SUPPLY — 77 items
ADH SKN CLS APL DERMABOND .7 (GAUZE/BANDAGES/DRESSINGS) ×1
ADH SKN CLS LQ APL DERMABOND (GAUZE/BANDAGES/DRESSINGS) ×1
APL SKNCLS STERI-STRIP NONHPOA (GAUZE/BANDAGES/DRESSINGS) ×1
APL SRG 60D 8 XTD TIP BNDBL (TIP) ×1
BAG DECANTER FOR FLEXI CONT (MISCELLANEOUS) ×2 IMPLANT
BENZOIN TINCTURE PRP APPL 2/3 (GAUZE/BANDAGES/DRESSINGS) ×2 IMPLANT
BLADE SURG 11 STRL SS (BLADE) IMPLANT
BLADE SURG ROTATE 9660 (MISCELLANEOUS) ×2 IMPLANT
BUR MATCHSTICK NEURO 3.0 LAGG (BURR) ×2 IMPLANT
CANISTER SUCTION 2500CC (MISCELLANEOUS) ×2 IMPLANT
CAP ELLIPSE LOCKING (Cap) ×16 IMPLANT
CLOTH BEACON ORANGE TIMEOUT ST (SAFETY) ×2 IMPLANT
CONT SPEC 4OZ CLIKSEAL STRL BL (MISCELLANEOUS) ×2 IMPLANT
DECANTER SPIKE VIAL GLASS SM (MISCELLANEOUS) ×2 IMPLANT
DERMABOND ADHESIVE PROPEN (GAUZE/BANDAGES/DRESSINGS) ×1
DERMABOND ADVANCED (GAUZE/BANDAGES/DRESSINGS) ×1
DERMABOND ADVANCED .7 DNX12 (GAUZE/BANDAGES/DRESSINGS) ×1 IMPLANT
DERMABOND ADVANCED .7 DNX6 (GAUZE/BANDAGES/DRESSINGS) IMPLANT
DRAPE C-ARM 42X72 X-RAY (DRAPES) ×4 IMPLANT
DRAPE LAPAROTOMY 100X72 PEDS (DRAPES) ×2 IMPLANT
DRAPE MICROSCOPE ZEISS OPMI (DRAPES) IMPLANT
DRAPE POUCH INSTRU U-SHP 10X18 (DRAPES) ×2 IMPLANT
DRAPE SURG 17X23 STRL (DRAPES) ×6 IMPLANT
DRSG OPSITE 4X5.5 SM (GAUZE/BANDAGES/DRESSINGS) ×4 IMPLANT
DRSG OPSITE POSTOP 4X6 (GAUZE/BANDAGES/DRESSINGS) ×2 IMPLANT
DURAPREP 26ML APPLICATOR (WOUND CARE) ×2 IMPLANT
DURASEAL APPLICATOR TIP (TIP) ×2 IMPLANT
DURASEAL SPINE SEALANT 3ML (MISCELLANEOUS) ×1 IMPLANT
ELECT REM PT RETURN 9FT ADLT (ELECTROSURGICAL) ×2
ELECTRODE REM PT RTRN 9FT ADLT (ELECTROSURGICAL) ×1 IMPLANT
EVACUATOR 1/8 PVC DRAIN (DRAIN) ×1 IMPLANT
EVACUATOR 3/16  PVC DRAIN (DRAIN)
EVACUATOR 3/16 PVC DRAIN (DRAIN) IMPLANT
GAUZE SPONGE 4X4 16PLY XRAY LF (GAUZE/BANDAGES/DRESSINGS) IMPLANT
GLOVE BIO SURGEON STRL SZ 6.5 (GLOVE) ×1 IMPLANT
GLOVE BIO SURGEON STRL SZ7 (GLOVE) ×4 IMPLANT
GLOVE BIO SURGEON STRL SZ8 (GLOVE) ×3 IMPLANT
GLOVE BIOGEL PI IND STRL 7.5 (GLOVE) IMPLANT
GLOVE BIOGEL PI INDICATOR 7.5 (GLOVE) ×1
GLOVE EXAM NITRILE LRG STRL (GLOVE) IMPLANT
GLOVE EXAM NITRILE MD LF STRL (GLOVE) ×1 IMPLANT
GLOVE EXAM NITRILE XL STR (GLOVE) IMPLANT
GLOVE EXAM NITRILE XS STR PU (GLOVE) IMPLANT
GLOVE INDICATOR 8.5 STRL (GLOVE) ×4 IMPLANT
GOWN BRE IMP SLV AUR LG STRL (GOWN DISPOSABLE) ×4 IMPLANT
GOWN BRE IMP SLV AUR XL STRL (GOWN DISPOSABLE) ×4 IMPLANT
GOWN STRL REIN 2XL LVL4 (GOWN DISPOSABLE) IMPLANT
KIT BASIN OR (CUSTOM PROCEDURE TRAY) ×2 IMPLANT
KIT ROOM TURNOVER OR (KITS) ×2 IMPLANT
MARKER SKIN DUAL TIP RULER LAB (MISCELLANEOUS) ×2 IMPLANT
NDL HYPO 25X1 1.5 SAFETY (NEEDLE) ×1 IMPLANT
NEEDLE HYPO 25X1 1.5 SAFETY (NEEDLE) ×2 IMPLANT
NEEDLE SPNL 20GX3.5 QUINCKE YW (NEEDLE) ×2 IMPLANT
NS IRRIG 1000ML POUR BTL (IV SOLUTION) ×2 IMPLANT
PACK LAMINECTOMY NEURO (CUSTOM PROCEDURE TRAY) ×2 IMPLANT
PAD ARMBOARD 7.5X6 YLW CONV (MISCELLANEOUS) ×6 IMPLANT
PIN MAYFIELD SKULL DISP (PIN) ×2 IMPLANT
PUTTY BONE DBX 2.5 MIS (Bone Implant) ×1 IMPLANT
ROD 3.5X240MM (Rod) ×1 IMPLANT
RUBBERBAND STERILE (MISCELLANEOUS) IMPLANT
SCREW 3.5X12MM (Screw) ×12 IMPLANT
SCREW 3.5X16 (Screw) ×4 IMPLANT
SPONGE GAUZE 4X4 12PLY (GAUZE/BANDAGES/DRESSINGS) ×2 IMPLANT
SPONGE LAP 4X18 X RAY DECT (DISPOSABLE) IMPLANT
SPONGE SURGIFOAM ABS GEL 100 (HEMOSTASIS) ×2 IMPLANT
STRIP CLOSURE SKIN 1/2X4 (GAUZE/BANDAGES/DRESSINGS) ×4 IMPLANT
SUT ETHILON 4 0 PS 2 18 (SUTURE) IMPLANT
SUT VIC AB 0 CT1 18XCR BRD8 (SUTURE) ×1 IMPLANT
SUT VIC AB 0 CT1 8-18 (SUTURE) ×4
SUT VIC AB 2-0 CT1 18 (SUTURE) ×2 IMPLANT
SUT VICRYL 4-0 PS2 18IN ABS (SUTURE) ×2 IMPLANT
SYR 20ML ECCENTRIC (SYRINGE) ×2 IMPLANT
TAPE STRIPS DRAPE STRL (GAUZE/BANDAGES/DRESSINGS) ×1 IMPLANT
TOWEL OR 17X24 6PK STRL BLUE (TOWEL DISPOSABLE) ×2 IMPLANT
TOWEL OR 17X26 10 PK STRL BLUE (TOWEL DISPOSABLE) ×2 IMPLANT
TRAY FOLEY CATH 14FRSI W/METER (CATHETERS) ×1 IMPLANT
WATER STERILE IRR 1000ML POUR (IV SOLUTION) ×2 IMPLANT

## 2013-03-07 NOTE — Anesthesia Postprocedure Evaluation (Signed)
Anesthesia Post Note  Patient: Cassandra Walker  Procedure(s) Performed: Procedure(s) (LRB): Cervical four -seven POSTERIOR CERVICAL FUSION (N/A)  Anesthesia type: General  Patient location: PACU  Post pain: Pain level controlled and Adequate analgesia  Post assessment: Post-op Vital signs reviewed, Patient's Cardiovascular Status Stable, Respiratory Function Stable, Patent Airway and Pain level controlled  Last Vitals:  Filed Vitals:   03/07/13 2214  BP:   Pulse:   Temp: 36.9 C  Resp:     Post vital signs: Reviewed and stable  Level of consciousness: awake, alert  and oriented  Complications: No apparent anesthesia complications

## 2013-03-07 NOTE — Anesthesia Preprocedure Evaluation (Addendum)
Anesthesia Evaluation  Patient identified by MRN, date of birth, ID band Patient awake    Reviewed: Allergy & Precautions, H&P , NPO status , Patient's Chart, lab work & pertinent test results, reviewed documented beta blocker date and time   Airway Mallampati: III  Neck ROM: limited  Mouth opening: Limited Mouth Opening Comment: In c-collar Dental  (+) Partial Upper, Partial Lower and Dental Advisory Given   Pulmonary          Cardiovascular hypertension, Pt. on medications and Pt. on home beta blockers     Neuro/Psych  Neuromuscular disease    GI/Hepatic GERD-  ,  Endo/Other    Renal/GU      Musculoskeletal  (+) Arthritis -, Osteoarthritis,    Abdominal   Peds  Hematology   Anesthesia Other Findings Atenolol taken this AM  Reproductive/Obstetrics                         Anesthesia Physical Anesthesia Plan  ASA: III and emergent  Anesthesia Plan: General   Post-op Pain Management:    Induction: Intravenous  Airway Management Planned: Oral ETT  Additional Equipment:   Intra-op Plan:   Post-operative Plan: Extubation in OR  Informed Consent: I have reviewed the patients History and Physical, chart, labs and discussed the procedure including the risks, benefits and alternatives for the proposed anesthesia with the patient or authorized representative who has indicated his/her understanding and acceptance.     Plan Discussed with: CRNA, Anesthesiologist and Surgeon  Anesthesia Plan Comments:         Anesthesia Quick Evaluation

## 2013-03-07 NOTE — ED Notes (Signed)
WUJ:[id number]<JY> Expected date:03/07/13<BR> Expected time:12:36 PM<BR> Means of arrival:Ambulance<BR> Comments:<BR> Riding lawn mow accident, shoulder pain
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
## 2013-03-07 NOTE — H&P (Signed)
Cassandra Walker is an 79 y.o. female.   Chief Complaint: Incomplete C7 quadriplegic HPI: Patient is a 79 year old female was on a riding lawnmower and was launched over the handlebars striking her head and neck initially had difficulty moving was brought to the emergency department was evaluated and noted be significantly weaker lower extremities as well as and patchy throughout her upper extremities CT scan of her neck revealed a fracture dislocation at C6-7 patient was sent for an MRI scanner and neurosurgery was consult. Currently the patient complains of severe neck pain which shocking sensations going up and down her spine with numbness in her hands and numbness and weakness in her legs she denies any significant headache or nausea or vomiting. She's having some shoulder pain hip pain but no abdominal pain.  Past Medical History  Diagnosis Date  . Osteoarthritis   . Hyperlipidemia   . GERD (gastroesophageal reflux disease)   . Peripheral neuropathy   . Osteopenia   . DDD (degenerative disc disease)   . Shingles   . Colon cancer 1977  . Thyroid cancer 1966    Past Surgical History  Procedure Laterality Date  . Thyroidectomy    . Total abdominal hysterectomy w/ bilateral salpingoophorectomy  1968  . Colon resection  1977  . Cervical laminectomy  1995  . Cataract extraction      bilateral  . Breast enhancement surgery      silicone    Family History  Problem Relation Age of Onset  . Lung cancer Father    Social History:  reports that she quit smoking about 40 years ago. She does not have any smokeless tobacco history on file. Her alcohol and drug histories are not on file.  Allergies:  Allergies  Allergen Reactions  . Codeine      (Not in a hospital admission)  Results for orders placed during the hospital encounter of 03/07/13 (from the past 48 hour(s))  CBC WITH DIFFERENTIAL     Status: Abnormal   Collection Time    03/07/13  2:21 PM      Result Value Range   WBC  20.2 (*) 4.0 - 10.5 K/uL   RBC 4.24  3.87 - 5.11 MIL/uL   Hemoglobin 12.4  12.0 - 15.0 g/dL   HCT 16.1 (*) 09.6 - 04.5 %   MCV 83.7  78.0 - 100.0 fL   MCH 29.2  26.0 - 34.0 pg   MCHC 34.9  30.0 - 36.0 g/dL   RDW 40.9  81.1 - 91.4 %   Platelets 278  150 - 400 K/uL   Neutrophils Relative 82 (*) 43 - 77 %   Lymphocytes Relative 11 (*) 12 - 46 %   Monocytes Relative 7  3 - 12 %   Eosinophils Relative 0  0 - 5 %   Basophils Relative 0  0 - 1 %   Neutro Abs 16.6 (*) 1.7 - 7.7 K/uL   Lymphs Abs 2.2  0.7 - 4.0 K/uL   Monocytes Absolute 1.4 (*) 0.1 - 1.0 K/uL   Eosinophils Absolute 0.0  0.0 - 0.7 K/uL   Basophils Absolute 0.0  0.0 - 0.1 K/uL   Smear Review MORPHOLOGY UNREMARKABLE    COMPREHENSIVE METABOLIC PANEL     Status: Abnormal   Collection Time    03/07/13  2:21 PM      Result Value Range   Sodium 137  135 - 145 mEq/L   Potassium 3.6  3.5 - 5.1 mEq/L  Chloride 100  96 - 112 mEq/L   CO2 25  19 - 32 mEq/L   Glucose, Bld 130 (*) 70 - 99 mg/dL   BUN 29 (*) 6 - 23 mg/dL   Creatinine, Ser 4.54 (*) 0.50 - 1.10 mg/dL   Calcium 9.1  8.4 - 09.8 mg/dL   Total Protein 6.9  6.0 - 8.3 g/dL   Albumin 3.8  3.5 - 5.2 g/dL   AST 28  0 - 37 U/L   ALT 18  0 - 35 U/L   Alkaline Phosphatase 70  39 - 117 U/L   Total Bilirubin 0.3  0.3 - 1.2 mg/dL   GFR calc non Af Amer 36 (*) >90 mL/min   GFR calc Af Amer 42 (*) >90 mL/min   Comment:            The eGFR has been calculated     using the CKD EPI equation.     This calculation has not been     validated in all clinical     situations.     eGFR's persistently     <90 mL/min signify     possible Chronic Kidney Disease.   Ct Head Wo Contrast  03/07/2013  *RADIOLOGY REPORT*  Clinical Data: Injury post fall  CT HEAD WITHOUT CONTRAST,CT CERVICAL SPINE WITHOUT CONTRAST  Technique:  Contiguous axial images were obtained from the base of the skull through the vertex without contrast.,Technique: Multidetector CT imaging of the cervical spine was  performed. Multiplanar CT image reconstructions were also generated.  Comparison: 10/16/12  Findings: No skull fracture is noted.  Stable cerebral atrophy. Stable periventricular and subcortical chronic white matter disease.  Ventricular size is stable from prior exam.  No acute infarction.  No mass lesion is noted on this unenhanced scan.  No intracranial hemorrhage, mass effect or midline shift.  IMPRESSION: No acute intracranial abnormality.  CT cervical spine without IV contrast  Comparison exam:  10/16/2012  Findings:  There is diffuse osteopenia.  Degenerative changes are noted C1-C2 articulation.  Cystic degenerative changes are noted within the C2 odontoid.  Again noted disc space flattening with large anterior osteophytes at C4-C5 level.  There is mild displaced fracture spinous process of C5 vertebral body.  There is a displaced oblique fracture of the C6 vertebral body.  Prevertebral soft tissue swelling is noted at this level.  There is about 6.5 mm separation with anterolisthesis of the superior anterior aspect of the C6 vertebral body.  This is best seen in sagittal image 24.  There is  stenosis of the spinal canal at this level.  No definite intraspinal hematoma. Correlation with MRI is recommended as clinically warranted. Anterior osteophytes and disc space flattening noted at C7T1 level.  There is no pneumothorax in visualized lung apices.  Bilateral apical scarring.  Impression: 1.There is mild displaced fracture spinous process of C5 vertebral body.  There is a displaced oblique fracture of the C6 vertebral body.  Prevertebral soft tissue swelling is noted at this level. There is about 6.5 mm separation with anterolisthesis of the superior anterior aspect of the C6 vertebral body.  This is best seen in sagittal image 24.  There is  stenosis of the spinal canal at this level.  No definite intraspinal hematoma.  Correlation with MRI is recommended as clinically warranted.  Anterior osteophytes and  disc space flattening noted at C7T1 level.  Critical findings discussed with Dr. Hyacinth Meeker.   Original Report Authenticated By: Natasha Mead, M.D.  Ct Cervical Spine Wo Contrast  03/07/2013  *RADIOLOGY REPORT*  Clinical Data: Injury post fall  CT HEAD WITHOUT CONTRAST,CT CERVICAL SPINE WITHOUT CONTRAST  Technique:  Contiguous axial images were obtained from the base of the skull through the vertex without contrast.,Technique: Multidetector CT imaging of the cervical spine was performed. Multiplanar CT image reconstructions were also generated.  Comparison: 10/16/12  Findings: No skull fracture is noted.  Stable cerebral atrophy. Stable periventricular and subcortical chronic white matter disease.  Ventricular size is stable from prior exam.  No acute infarction.  No mass lesion is noted on this unenhanced scan.  No intracranial hemorrhage, mass effect or midline shift.  IMPRESSION: No acute intracranial abnormality.  CT cervical spine without IV contrast  Comparison exam:  10/16/2012  Findings:  There is diffuse osteopenia.  Degenerative changes are noted C1-C2 articulation.  Cystic degenerative changes are noted within the C2 odontoid.  Again noted disc space flattening with large anterior osteophytes at C4-C5 level.  There is mild displaced fracture spinous process of C5 vertebral body.  There is a displaced oblique fracture of the C6 vertebral body.  Prevertebral soft tissue swelling is noted at this level.  There is about 6.5 mm separation with anterolisthesis of the superior anterior aspect of the C6 vertebral body.  This is best seen in sagittal image 24.  There is  stenosis of the spinal canal at this level.  No definite intraspinal hematoma. Correlation with MRI is recommended as clinically warranted. Anterior osteophytes and disc space flattening noted at C7T1 level.  There is no pneumothorax in visualized lung apices.  Bilateral apical scarring.  Impression: 1.There is mild displaced fracture spinous process  of C5 vertebral body.  There is a displaced oblique fracture of the C6 vertebral body.  Prevertebral soft tissue swelling is noted at this level. There is about 6.5 mm separation with anterolisthesis of the superior anterior aspect of the C6 vertebral body.  This is best seen in sagittal image 24.  There is  stenosis of the spinal canal at this level.  No definite intraspinal hematoma.  Correlation with MRI is recommended as clinically warranted.  Anterior osteophytes and disc space flattening noted at C7T1 level.  Critical findings discussed with Dr. Hyacinth Meeker.   Original Report Authenticated By: Natasha Mead, M.D.     Review of Systems  Constitutional: Negative.   HENT: Positive for neck pain.   Eyes: Negative.   Respiratory: Negative.   Cardiovascular: Negative.   Gastrointestinal: Negative.   Genitourinary: Negative.   Musculoskeletal: Positive for joint pain.  Skin: Negative.   Neurological: Positive for tingling, sensory change, focal weakness and headaches.  Endo/Heme/Allergies: Negative.   Psychiatric/Behavioral: Negative.     Blood pressure 165/68, pulse 75, temperature 97.6 F (36.4 C), temperature source Oral, resp. rate 10, SpO2 99.00%. Physical Exam  Constitutional: She is oriented to person, place, and time. She appears well-developed and well-nourished.  HENT:  Head: Normocephalic.  Neck: Normal range of motion.  Cardiovascular: Normal rate.   Respiratory: Effort normal.  GI: Soft.  Musculoskeletal: Normal range of motion.  Neurological: She is alert and oriented to person, place, and time. A sensory deficit is present. She exhibits abnormal muscle tone. GCS eye subscore is 4. GCS verbal subscore is 5. GCS motor subscore is 6.  Reflex Scores:      Tricep reflexes are 0 on the right side and 0 on the left side.      Bicep reflexes are 1+ on the right side  and 1+ on the left side.      Brachioradialis reflexes are 0 on the right side and 0 on the left side.      Patellar  reflexes are 0 on the right side and 0 on the left side.      Achilles reflexes are 0 on the right side and 0 on the left side. The patient is an incomplete C7 quadriplegic. ME strength on the right is 4+ out of 5 in her deltoids and biceps triceps is 4 minus out of 5 grip wrist flexion extension are all 4-4+ out of 5 in the left upper cavity deltoid and biceps are 4+ out of 5 triceps is 3/5 grip is 1-2/5 wrist flexion extension are also 1-2/5 lower Cordelia Pen strength is 0/5 in her lower extremities with some light touch but significant decreased sensation throughout her hands and her legs. Her neck is markedly tender to palpation  Skin: Skin is warm.     Assessment/Plan 79 year old female with a fracture sedation C6-7 with a 4050% subluxation with an incomplete spinal cord injury virtually complete motor loss of her lower extremities with some preserved sensation significantly weak left hand and distal upper extremities and right CT scan shows marked spinal cord compression from the subluxation. MRI scan is pending will transported emergently to tone OR after the MRI scan for cervical decompression and fusion would favor a posterior cervical decompression and fusion due to her malalignment will drill her facets driving a vector alignment pending the results of her MRI scan. I have extensively gone over the risks and benefits of the MRI scan transferred back and forth to the OR into the OR and the surgery with the patient's family they understand and agree to proceed forward.  Hlee Fringer P 03/07/2013, 3:55 PM

## 2013-03-07 NOTE — ED Notes (Signed)
C collar applied by MD Hyacinth Meeker

## 2013-03-07 NOTE — ED Notes (Signed)
Patient being prepared for surgery at [facility].

## 2013-03-07 NOTE — ED Notes (Signed)
Patient sent to [facility] from [facility] MRI. Face Sheet and [facility] Sheet printed. EMTALA form completed per MD Hyacinth Meeker.

## 2013-03-07 NOTE — ED Provider Notes (Signed)
History     CSN: [id number]  Arrival date & time 03/07/13  1252   First MD Initiated Contact with Patient 03/07/13 1259      Chief Complaint  Patient presents with  . Shoulder Pain    (Consider location/radiation/quality/duration/timing/severity/associated sxs/prior treatment) HPI Comments: 79 year old female who presents after riding on her riding lawnmower, fell over the front of the lawnmower landing on the back of her neck. This was acute in onset, the neck pain is persistent, severe, associated with weakness of her left upper extremity and right lower extremity. She is unable to ambulate, denies significant head injury or headache and has no loss of consciousness, blurred vision, nausea or vomiting. She required EMS transport, no immobilization given  Patient is a 80 y.o. female presenting with shoulder pain. The history is provided by the patient and the EMS personnel.  Shoulder Pain    Past Medical History  Diagnosis Date  . Osteoarthritis   . Hyperlipidemia   . GERD (gastroesophageal reflux disease)   . Peripheral neuropathy   . Osteopenia   . DDD (degenerative disc disease)   . Shingles   . Colon cancer 1977  . Thyroid cancer 1966    Past Surgical History  Procedure Laterality Date  . Thyroidectomy    . Total abdominal hysterectomy w/ bilateral salpingoophorectomy  1968  . Colon resection  1977  . Cervical laminectomy  1995  . Cataract extraction      bilateral  . Breast enhancement surgery      silicone    Family History  Problem Relation Age of Onset  . Lung cancer Father     History  Substance Use Topics  . Smoking status: Former Smoker -- 1.00 packs/day for 20 years    Quit date: 11/05/1972  . Smokeless tobacco: Not on file  . Alcohol Use: Not on file    OB History   Grav Para Term Preterm Abortions TAB SAB Ect Mult Living                  Review of Systems  All other systems reviewed and are negative.    Allergies  Codeine  Home  Medications   Current Outpatient Rx  Name  Route  Sig  Dispense  Refill  . ALPRAZolam (XANAX) 1 MG tablet   Oral   Take 1 mg by mouth at bedtime as needed for sleep.         Marland Kitchen atenolol (TENORMIN) 100 MG tablet   Oral   Take 100 mg by mouth daily.         Marland Kitchen atorvastatin (LIPITOR) 10 MG tablet   Oral   Take 5 mg by mouth daily.           Marland Kitchen levothyroxine (SYNTHROID, LEVOTHROID) 75 MCG tablet   Oral   Take 75 mcg by mouth daily.           Marland Kitchen lisinopril-hydrochlorothiazide (PRINZIDE,ZESTORETIC) 20-12.5 MG per tablet   Oral   Take 2 tablets by mouth daily.         . meloxicam (MOBIC) 15 MG tablet   Oral   Take 15 mg by mouth daily.         . Naproxen Sodium (ALEVE) 220 MG CAPS   Oral   Take by mouth as needed.             BP 165/68  Pulse 75  Temp(Src) 97.6 F (36.4 C) (Oral)  Resp 10  SpO2 99%  Physical  Exam  Nursing note and vitals reviewed. Constitutional: She appears well-developed and well-nourished.  Uncomfortable appearing  HENT:  Head: Normocephalic and atraumatic.  Mouth/Throat: Oropharynx is clear and moist. No oropharyngeal exudate.  no facial tenderness, deformity, malocclusion or hemotympanum.  no battle's sign or racoon eyes.   Eyes: Conjunctivae and EOM are normal. Pupils are equal, round, and reactive to light. Right eye exhibits no discharge. Left eye exhibits no discharge. No scleral icterus.  Neck: No JVD present. No thyromegaly present.  Cardiovascular: Normal rate, regular rhythm, normal heart sounds and intact distal pulses.  Exam reveals no gallop and no friction rub.   No murmur heard. Pulmonary/Chest: Effort normal and breath sounds normal. No respiratory distress. She has no wheezes. She has no rales.  Abdominal: Soft. Bowel sounds are normal. She exhibits no distension and no mass. There is no tenderness.  Musculoskeletal: She exhibits no edema and no tenderness.  No limb deformities, left upper extremity with missing index  finger related to prior trauma many years ago. No pain with range of motion of any of the major joints except for her left shoulder. She is able to internally and externally rotate but with abduction there is significant pain in the left shoulder. No obvious deformity, no tenderness over the clavicles, no tenderness of the pelvis, normal range of motion of the bilateral lower extremities with passive range of motion. Active range of motion limited, see neuro exam below  Lymphadenopathy:    She has no cervical adenopathy.  Neurological: She is alert. Coordination normal.  Speech is clear, memory is intact, cranial nerves III through XII appear intact, right upper extremity with normal strength and sensation to light-touch and pinprick, normal coordination. Left lower extremity with normal sensation to light touch and pinprick, able to straight leg raise, normal strength at the hips knees and ankles.  Left upper extremity with weakness of the elbow extensors, weakness of the grip, normal strength at the biceps. Right lower extremity with weakness of the right lower extremity at the ankle knee including extensors and flexors at the knee, not able to straight leg raise secondary to weakness. Sensation to light touch and pinprick preserved on the bilateral lower and upper extremities.  Skin: Skin is warm and dry. No rash noted. No erythema.  Psychiatric: She has a normal mood and affect. Her behavior is normal.    ED Course  Procedures (including critical care time)  Labs Reviewed  CBC WITH DIFFERENTIAL - Abnormal; Notable for the following:    WBC 20.2 (*)    HCT 35.5 (*)    Neutrophils Relative 82 (*)    Lymphocytes Relative 11 (*)    Neutro Abs 16.6 (*)    Monocytes Absolute 1.4 (*)    All other components within normal limits  COMPREHENSIVE METABOLIC PANEL - Abnormal; Notable for the following:    Glucose, Bld 130 (*)    BUN 29 (*)    Creatinine, Ser 1.36 (*)    GFR calc non Af Amer 36 (*)     GFR calc Af Amer 42 (*)    All other components within normal limits   Ct Head Wo Contrast  03/07/2013  *RADIOLOGY REPORT*  Clinical Data: Injury post fall  CT HEAD WITHOUT CONTRAST,CT CERVICAL SPINE WITHOUT CONTRAST  Technique:  Contiguous axial images were obtained from the base of the skull through the vertex without contrast.,Technique: Multidetector CT imaging of the cervical spine was performed. Multiplanar CT image reconstructions were also generated.  Comparison:  10/16/12  Findings: No skull fracture is noted.  Stable cerebral atrophy. Stable periventricular and subcortical chronic white matter disease.  Ventricular size is stable from prior exam.  No acute infarction.  No mass lesion is noted on this unenhanced scan.  No intracranial hemorrhage, mass effect or midline shift.  IMPRESSION: No acute intracranial abnormality.  CT cervical spine without IV contrast  Comparison exam:  10/16/2012  Findings:  There is diffuse osteopenia.  Degenerative changes are noted C1-C2 articulation.  Cystic degenerative changes are noted within the C2 odontoid.  Again noted disc space flattening with large anterior osteophytes at C4-C5 level.  There is mild displaced fracture spinous process of C5 vertebral body.  There is a displaced oblique fracture of the C6 vertebral body.  Prevertebral soft tissue swelling is noted at this level.  There is about 6.5 mm separation with anterolisthesis of the superior anterior aspect of the C6 vertebral body.  This is best seen in sagittal image 24.  There is  stenosis of the spinal canal at this level.  No definite intraspinal hematoma. Correlation with MRI is recommended as clinically warranted. Anterior osteophytes and disc space flattening noted at C7T1 level.  There is no pneumothorax in visualized lung apices.  Bilateral apical scarring.  Impression: 1.There is mild displaced fracture spinous process of C5 vertebral body.  There is a displaced oblique fracture of the C6  vertebral body.  Prevertebral soft tissue swelling is noted at this level. There is about 6.5 mm separation with anterolisthesis of the superior anterior aspect of the C6 vertebral body.  This is best seen in sagittal image 24.  There is  stenosis of the spinal canal at this level.  No definite intraspinal hematoma.  Correlation with MRI is recommended as clinically warranted.  Anterior osteophytes and disc space flattening noted at C7T1 level.  Critical findings discussed with Dr. Hyacinth Meeker.   Original Report Authenticated By: Natasha Mead, M.D.    Ct Cervical Spine Wo Contrast  03/07/2013  *RADIOLOGY REPORT*  Clinical Data: Injury post fall  CT HEAD WITHOUT CONTRAST,CT CERVICAL SPINE WITHOUT CONTRAST  Technique:  Contiguous axial images were obtained from the base of the skull through the vertex without contrast.,Technique: Multidetector CT imaging of the cervical spine was performed. Multiplanar CT image reconstructions were also generated.  Comparison: 10/16/12  Findings: No skull fracture is noted.  Stable cerebral atrophy. Stable periventricular and subcortical chronic white matter disease.  Ventricular size is stable from prior exam.  No acute infarction.  No mass lesion is noted on this unenhanced scan.  No intracranial hemorrhage, mass effect or midline shift.  IMPRESSION: No acute intracranial abnormality.  CT cervical spine without IV contrast  Comparison exam:  10/16/2012  Findings:  There is diffuse osteopenia.  Degenerative changes are noted C1-C2 articulation.  Cystic degenerative changes are noted within the C2 odontoid.  Again noted disc space flattening with large anterior osteophytes at C4-C5 level.  There is mild displaced fracture spinous process of C5 vertebral body.  There is a displaced oblique fracture of the C6 vertebral body.  Prevertebral soft tissue swelling is noted at this level.  There is about 6.5 mm separation with anterolisthesis of the superior anterior aspect of the C6 vertebral  body.  This is best seen in sagittal image 24.  There is  stenosis of the spinal canal at this level.  No definite intraspinal hematoma. Correlation with MRI is recommended as clinically warranted. Anterior osteophytes and disc space flattening noted at C7T1  level.  There is no pneumothorax in visualized lung apices.  Bilateral apical scarring.  Impression: 1.There is mild displaced fracture spinous process of C5 vertebral body.  There is a displaced oblique fracture of the C6 vertebral body.  Prevertebral soft tissue swelling is noted at this level. There is about 6.5 mm separation with anterolisthesis of the superior anterior aspect of the C6 vertebral body.  This is best seen in sagittal image 24.  There is  stenosis of the spinal canal at this level.  No definite intraspinal hematoma.  Correlation with MRI is recommended as clinically warranted.  Anterior osteophytes and disc space flattening noted at C7T1 level.  Critical findings discussed with Dr. Hyacinth Meeker.   Original Report Authenticated By: Natasha Mead, M.D.      1. C6 cervical fracture, closed, initial encounter   2. Cervical spinal cord injury, initial encounter       MDM  The patient has a significantly abnormal neurologic exam, this could all be answered by cervical fracture with spinal cord injury, CT scan ordered stat, labs, CT scan of the head, no other signs of trauma other than the left shoulder, plain films of the shoulder. Vital signs show mild hypertension, otherwise unremarkable., Pain medications ordered, cervical collar placed immediately on arrival, spinal precautions maintained.  The patient is at high risk for spinal cord injury given her osteopenia, likely flexion injury of the neck and focal neurologic deficits. Spinal precautions maintained, cervical collar maintained, no improvement in the patient's symptoms over the course of her workup but has developed hyperesthesia is of her left upper extremity. Her right lower extremity  remains extremely weak.  Discussed with Dr. Wynetta Emery after the CT scan showed spinal fracture of C6. Stat MRI ordered, Dr. Wynetta Emery will see in the emergency department, anticipate surgery.  Labs show significant leukocytosis, No anemia, CMP pending at this time.  Intravenous steroids ordered, pain medications ordered for pain control, n.p.o., critical care delivered for this significant and serious spinal cord injury.  CRITICAL CARE Performed by: Vida Roller   Total critical care time: 35  Critical care time was exclusive of separately billable procedures and treating other patients.  Critical care was necessary to treat or prevent imminent or life-threatening deterioration.  Critical care was time spent personally by me on the following activities: development of treatment plan with patient and/or surrogate as well as nursing, discussions with consultants, evaluation of patient's response to treatment, examination of patient, obtaining history from patient or surrogate, ordering and performing treatments and interventions, ordering and review of laboratory studies, ordering and review of radiographic studies, pulse oximetry and re-evaluation of patient's condition.  Meds given in ED:  Medications  HYDROmorphone (DILAUDID) injection 0.5 mg (0.5 mg Intravenous Given 03/07/13 1453)  dexamethasone (DECADRON) injection 10 mg (10 mg Intravenous Given 03/07/13 1502)          Vida Roller, MD 03/07/13 [id number]

## 2013-03-07 NOTE — Anesthesia Procedure Notes (Addendum)
Procedure Name: Intubation Date/Time: 03/07/2013 4:59 PM Performed by: Alanda Amass A Pre-anesthesia Checklist: Patient identified, Timeout performed, Emergency Drugs available, Suction available and Patient being monitored Patient Re-evaluated:Patient Re-evaluated prior to inductionOxygen Delivery Method: Circle system utilized Preoxygenation: Pre-oxygenation with 100% oxygen Intubation Type: IV induction and Rapid sequence Grade View: Grade I Tube type: Oral Tube size: 7.5 mm Number of attempts: 1 Airway Equipment and Method: Video-laryngoscopy Placement Confirmation: ETT inserted through vocal cords under direct vision,  breath sounds checked- equal and bilateral and positive ETCO2 Secured at: 21 cm Tube secured with: Tape Dental Injury: Teeth and Oropharynx as per pre-operative assessment  Comments: C-collar on for induction, intubation.  Head and neck neutral throughout. Laryngoscopy by Dr. Chaney Malling

## 2013-03-07 NOTE — Op Note (Signed)
Preoperative diagnosis: Fracture dislocation C6-7 with incomplete C7 quadriplegia  Postoperative diagnosis: Same  Procedure: Posterior cervical decompression C4-5, C5-6, C6-7  #2 open reduction spinal deformity C6-7  #3 lateral mass fixation C4, C5, C6 with pedicle screws in C7  #4 placement of a beam Hemovac drain  #5 posterior lateral fusion with autograft mixed with DBX from C4-C7  Surgeon: Jillyn Hidden Brylin Stopper  Anesthesia: Gen.  EBL: Minimal  History of present illness: Patient is a very pleasant 79 year old female who presents after sustaining an injury where she was thrown off the front of a riding lawnmower eyes she was brought by EMS and brought to the emergency noted to be quadriplegic with sensation in her lower extremities no minimal to no movement and weakness in her upper x-rays. Workup a CT scan subsequent MRI scan showed a fracture dislocation C6-7 with jumped facets bilaterally with diffuse facet joints at C5-C6 and C7-T1 by a level that was not fused at C4-5 but then fused at C3-4 and C2-3 so because C4-5 was a floating level I recommended carrying her fusion up to include C4. And because C7-T1 was also fused I felt like we could put pedicle screws and C7 now be strong enough inferior hole for the construct. The patient was emergently transferred over to [facility] cone and reamer was inserted through the OR induced under general anesthesia and positioned prone in neutral position in pins the back of her neck was shaved prepped and draped in routine sterile fashion a midline incision was made after infiltration of 10 cc lidocaine with epi and Bovie light cautery was used to take down the subcutaneous tissues and subperiosteal dissections care lamina of C4, C5, C6, C7, and T1. Extensive paraspinal and ligamentous disruption was immediate identified upon opening up the incision marked muscular hematoma was dissected free the C6 spinous processes at the fracture in the jumped facets at C6 and were  immediately identified sector after adequate exposure been achieved the facet complexes C7 were drilled down and then using a Kerrison initial laminotomy was begun on the super as of the C7 lamina and then the C6 lamina facet complex was then reduced overlaid top of it and drilled down facet joints at C7 restoring her anatomic alignment. At this point complete central decompression was performed with removal of the C6-C5 and part of the C4 spinous process and central decompression was completed during the initial workup with MRI scan was thought there may be some dorsal hematoma superior the fracture dislocation and so I extended the laminotomy up search for this however there was no hematoma in right to the fracture sedation he wasn't hematoma inferiorly retied degrees of this as I extended the laminotomy down to include C7 however there is no I removed all compressive hematoma there is a small rim of noncompressive hematoma that left behind T1 and distal. Radical foraminotomies were carried out in the C7 nerve root and it was a medially identified and a evulsion of the left C7 nerve root partial with a dural tear in the axilla of the C7 nerve root had been carried out by fracture dislocation. These foraminotomies are carried out decompress both C7 nerve roots the left C7 nerve root was markedly compressed prior to the foraminotomy. The pedicles were dissected free at C7. At this point is taken to screw placement using the inferomedial quadrant with a trajectory out the superolateral quadrant pilot holes were drilled and lateral masses at C4-5 and 6 these were then drilled cannulated probed tapped  the cortex and 612 mm lateral mass screws were inserted the lateral masses all screws excellent purchase then done my light apron and that would under fluoroscopy and direct visualization of the pedicles of C7 these pedicle screws were placed to 60 mm screws were inserted under direct visualization as well as with  fluoroscopic assistance the screws also excellent purchase then the wound scope see her get meticulous hemostasis was maintained aggressive decortication was carried out within the facet complexes at C4-5 C5-6 and C6-7 correction not C6-7 at the foraminotomies been achieved this is packed with Gelfoam but it is facet complexes packed autograft mixed with DBX and then this was also packed laterally along the lateral masses rods were then placed top tightness tightened down at all levels the construct was solid meticulous hemostasis was maintained a subdural seal was overlaid top of the axillary dural rent and then Gelfoam more DuraSeal and more Gelfoam and anemia displacing the contralateral side and the wounds closed in layers with her with interrupted Vicryl running 4 septic or benzoin Steri-Strips and Dermabond. Patient was then sent to recovery room and was being evaluated by the trauma service. At the end of case on it counts sponge counts were correct.

## 2013-03-07 NOTE — ED Notes (Signed)
Patient out cutting grass, got caught under swing fell off lawnmower and hit a tree root. C/O pain in left scapula area.

## 2013-03-07 NOTE — ED Notes (Signed)
Patient transported to MRI 

## 2013-03-07 NOTE — Transfer of Care (Signed)
Immediate Anesthesia Transfer of Care Note  Patient: Cassandra Walker  Procedure(s) Performed: Procedure(s): Cervical four -seven POSTERIOR CERVICAL FUSION (N/A)  Patient Location: PACU  Anesthesia Type:General  Level of Consciousness: awake, sedated and patient cooperative  Airway & Oxygen Therapy: Patient Spontanous Breathing and Patient connected to nasal cannula oxygen  Post-op Assessment: Report given to PACU RN and Post -op Vital signs reviewed and stable  Post vital signs: Reviewed and stable  Complications: No apparent anesthesia complications

## 2013-03-07 NOTE — ED Notes (Signed)
MRI called to ensure that patient be log rolled

## 2013-03-07 NOTE — ED Notes (Addendum)
Has pain in left side of neck that radiates to left arm. Unable to fully assess due to pain. Able to move from elbow down but unable to move shoulder

## 2013-03-08 ENCOUNTER — Inpatient Hospital Stay (HOSPITAL_COMMUNITY): Payer: Medicare Other

## 2013-03-08 LAB — CBC
Hemoglobin: 10.8 g/dL — ABNORMAL LOW (ref 12.0–15.0)
RBC: 3.77 MIL/uL — ABNORMAL LOW (ref 3.87–5.11)

## 2013-03-08 LAB — COMPREHENSIVE METABOLIC PANEL
ALT: 17 U/L (ref 0–35)
Alkaline Phosphatase: 58 U/L (ref 39–117)
CO2: 26 mEq/L (ref 19–32)
GFR calc Af Amer: 51 mL/min — ABNORMAL LOW (ref >90)
GFR calc non Af Amer: 44 mL/min — ABNORMAL LOW (ref >90)
Glucose, Bld: 191 mg/dL — ABNORMAL HIGH (ref 70–99)
Potassium: 4.1 mEq/L (ref 3.5–5.1)
Sodium: 140 mEq/L (ref 135–145)

## 2013-03-08 MED ORDER — DEXAMETHASONE SODIUM PHOSPHATE 4 MG/ML IJ SOLN
4.0000 mg | Freq: Four times a day (QID) | INTRAMUSCULAR | Status: AC
  Administered 2013-03-08 (×2): 4 mg via INTRAVENOUS

## 2013-03-08 MED ORDER — DIAZEPAM 5 MG/ML IJ SOLN
2.5000 mg | Freq: Four times a day (QID) | INTRAMUSCULAR | Status: DC | PRN
  Administered 2013-03-08 – 2013-03-09 (×2): 2.5 mg via INTRAVENOUS
  Filled 2013-03-08 (×2): qty 2

## 2013-03-08 NOTE — Progress Notes (Signed)
Patient ID: Cassandra Walker, female   DOB: 09/02/1932, 79 y.o.   MRN: [id number] 1 Day Post-Op  Subjective: Pt c/o left hip pain, abd a little sore, like gas pain, denies other complaints  Objective: Vital signs in last 24 hours: Temp:  [97.6 F (36.4 C)-98.5 F (36.9 C)] 98.1 F (36.7 C) (05/04 0800) Pulse Rate:  [63-85] 85 (05/04 1100) Resp:  [9-20] 13 (05/04 1100) BP: (134-182)/(48-121) 149/63 mmHg (05/04 1100) SpO2:  [93 %-100 %] 98 % (05/04 1100) Weight:  [166 lb 14.2 oz (75.7 kg)-171 lb 4.8 oz (77.7 kg)] 171 lb 4.8 oz (77.7 kg) (05/04 0402)    Intake/Output from previous day: 05/03 0701 - 05/04 0700 In: 3525 [I.V.:3475; IV Piggyback:50] Out: 1405 [Urine:955; Drains:150; Blood:300] Intake/Output this shift:    PE: Abd: soft, mildly tender, +BS Ext: moving right arm, left flaccid General: good spirits, NAD  Lab Results:   Recent Labs  03/07/13 2115 03/08/13 0447  WBC 12.3* 14.2*  HGB 11.1* 10.8*  HCT 32.2* 31.2*  PLT 208 236   BMET  Recent Labs  03/07/13 2115 03/08/13 0447  NA 137 140  K 3.8 4.1  CL 101 102  CO2 25 26  GLUCOSE 194* 191*  BUN 25* 25*  CREATININE 1.19* 1.16*  CALCIUM 8.5 8.5   PT/INR  Recent Labs  03/07/13 2115  LABPROT 13.0  INR 0.99   CMP     Component Value Date/Time   NA 140 03/08/2013 0447   K 4.1 03/08/2013 0447   CL 102 03/08/2013 0447   CO2 26 03/08/2013 0447   GLUCOSE 191* 03/08/2013 0447   BUN 25* 03/08/2013 0447   CREATININE 1.16* 03/08/2013 0447   CALCIUM 8.5 03/08/2013 0447   PROT 6.2 03/08/2013 0447   ALBUMIN 3.2* 03/08/2013 0447   AST 34 03/08/2013 0447   ALT 17 03/08/2013 0447   ALKPHOS 58 03/08/2013 0447   BILITOT 0.3 03/08/2013 0447   GFRNONAA 44* 03/08/2013 0447   GFRAA 51* 03/08/2013 0447   Lipase  No results found for this basename: lipase       Studies/Results: Dg Cervical Spine 2-3 Views  03/07/2013  *RADIOLOGY REPORT*  Clinical Data: Fracture.  Trauma.  CERVICAL SPINE - 2-3 VIEW,DG C-ARM GT 120 MIN  Fluoroscopic time:   24 seconds.  Comparison: MR same date.  Findings: Three intraoperative C-arm views submitted for review after surgery.  Posterior fusion and C4-C7.  C7 fracture is are poorly delineated secondary to patient's overlying shoulders.  Surgical sponges in place.  IMPRESSION: Fusion C4-C7. This can be evaluated on follow-up as present exam is limited by patient's shoulders.   Original Report Authenticated By: Lacy Duverney, M.D.    Ct Head Wo Contrast  03/07/2013  *RADIOLOGY REPORT*  Clinical Data: Injury post fall  CT HEAD WITHOUT CONTRAST,CT CERVICAL SPINE WITHOUT CONTRAST  Technique:  Contiguous axial images were obtained from the base of the skull through the vertex without contrast.,Technique: Multidetector CT imaging of the cervical spine was performed. Multiplanar CT image reconstructions were also generated.  Comparison: 10/16/12  Findings: No skull fracture is noted.  Stable cerebral atrophy. Stable periventricular and subcortical chronic Jabarie Pop matter disease.  Ventricular size is stable from prior exam.  No acute infarction.  No mass lesion is noted on this unenhanced scan.  No intracranial hemorrhage, mass effect or midline shift.  IMPRESSION: No acute intracranial abnormality.  CT cervical spine without IV contrast  Comparison exam:  10/16/2012  Findings:  There is diffuse osteopenia.  Degenerative changes are noted C1-C2 articulation.  Cystic degenerative changes are noted within the C2 odontoid.  Again noted disc space flattening with large anterior osteophytes at C4-C5 level.  There is mild displaced fracture spinous process of C5 vertebral body.  There is a displaced oblique fracture of the C6 vertebral body.  Prevertebral soft tissue swelling is noted at this level.  There is about 6.5 mm separation with anterolisthesis of the superior anterior aspect of the C6 vertebral body.  This is best seen in sagittal image 24.  There is  stenosis of the spinal canal at this level.  No definite intraspinal  hematoma. Correlation with MRI is recommended as clinically warranted. Anterior osteophytes and disc space flattening noted at C7T1 level.  There is no pneumothorax in visualized lung apices.  Bilateral apical scarring.  Impression: 1.There is mild displaced fracture spinous process of C5 vertebral body.  There is a displaced oblique fracture of the C6 vertebral body.  Prevertebral soft tissue swelling is noted at this level. There is about 6.5 mm separation with anterolisthesis of the superior anterior aspect of the C6 vertebral body.  This is best seen in sagittal image 24.  There is  stenosis of the spinal canal at this level.  No definite intraspinal hematoma.  Correlation with MRI is recommended as clinically warranted.  Anterior osteophytes and disc space flattening noted at C7T1 level.  Critical findings discussed with Dr. Hyacinth Meeker.   Original Report Authenticated By: Natasha Mead, M.D.    Ct Cervical Spine Wo Contrast  03/07/2013  *RADIOLOGY REPORT*  Clinical Data: Injury post fall  CT HEAD WITHOUT CONTRAST,CT CERVICAL SPINE WITHOUT CONTRAST  Technique:  Contiguous axial images were obtained from the base of the skull through the vertex without contrast.,Technique: Multidetector CT imaging of the cervical spine was performed. Multiplanar CT image reconstructions were also generated.  Comparison: 10/16/12  Findings: No skull fracture is noted.  Stable cerebral atrophy. Stable periventricular and subcortical chronic Faryal Marxen matter disease.  Ventricular size is stable from prior exam.  No acute infarction.  No mass lesion is noted on this unenhanced scan.  No intracranial hemorrhage, mass effect or midline shift.  IMPRESSION: No acute intracranial abnormality.  CT cervical spine without IV contrast  Comparison exam:  10/16/2012  Findings:  There is diffuse osteopenia.  Degenerative changes are noted C1-C2 articulation.  Cystic degenerative changes are noted within the C2 odontoid.  Again noted disc space flattening  with large anterior osteophytes at C4-C5 level.  There is mild displaced fracture spinous process of C5 vertebral body.  There is a displaced oblique fracture of the C6 vertebral body.  Prevertebral soft tissue swelling is noted at this level.  There is about 6.5 mm separation with anterolisthesis of the superior anterior aspect of the C6 vertebral body.  This is best seen in sagittal image 24.  There is  stenosis of the spinal canal at this level.  No definite intraspinal hematoma. Correlation with MRI is recommended as clinically warranted. Anterior osteophytes and disc space flattening noted at C7T1 level.  There is no pneumothorax in visualized lung apices.  Bilateral apical scarring.  Impression: 1.There is mild displaced fracture spinous process of C5 vertebral body.  There is a displaced oblique fracture of the C6 vertebral body.  Prevertebral soft tissue swelling is noted at this level. There is about 6.5 mm separation with anterolisthesis of the superior anterior aspect of the C6 vertebral body.  This is best seen in sagittal image 24.  There is  stenosis of the spinal canal at this level.  No definite intraspinal hematoma.  Correlation with MRI is recommended as clinically warranted.  Anterior osteophytes and disc space flattening noted at C7T1 level.  Critical findings discussed with Dr. Hyacinth Meeker.   Original Report Authenticated By: Natasha Mead, M.D.    Mr Cervical Spine Wo Contrast  03/07/2013  *RADIOLOGY REPORT*  Clinical Data: Fall from [employer].  Fracture.  MRI CERVICAL SPINE WITHOUT CONTRAST  Technique:  Multiplanar and multiecho pulse sequences of the cervical spine, to include the craniocervical junction and cervicothoracic junction, were obtained according to standard protocol without intravenous contrast.  Comparison: 03/07/2013 CT.  Findings: C2-C4 fusion.  C5-6 fusion.  Fusion of the posterior elements of C7-T1.  Anterior wedge compression fracture of the C7 vertebral body with 60% loss of  height.  Fracture extends towards the pedicle region and may extend into the right C7 pedicle.  Bilateral C6-7 facet fractures with jumped (anterior displaced) C6 facet with respect to the C7 facet.  Relative retrolisthesis of the compressed to C7 vertebral body secondary to the anterior slip of C6.  The cord is compressed.  Within the compressed cord there is increased signal suggesting edema.  Small amount of cord hemorrhage not excluded.  Circumferential epidural hematoma greater on the left extends from C1-T4.  This causes narrowing of the thecal sac and slight impression upon the cord.  Loss of visualized cerebrospinal fluid/cord narrowing most prominent C4 through C7 and greater on the left.  Flow voids within the vertebral arteries indicating patency.  Prevertebral hematoma extends from upper C5 to the lower T2 level.  Paraspinal edema/hemorrhage consistent with soft tissue injury extends from the occiput to the T3 level.  Interspinous ligament injury may be present.  Cystic changes within the dens.  This appears chronic.  Transverse ligament hypertrophy.  Baseline narrowed canal at the cervical medullary junction to which epidural hematoma further contributes.  IMPRESSION: Anterior wedge compression fracture of the C7 vertebral body with 60% loss of height.  Fracture extends towards the pedicle region and may extend into the right C7 pedicle.  Bilateral C6-7 facet fractures with jumped (anterior displaced) C6 facet with respect to the C7 facet.  Relative retrolisthesis of the compressed to C7 vertebral body secondary to the anterior slip of C6.  The cord is compressed.  Within the compressed cord there is increased signal suggesting edema.  Small amount of cord hemorrhage not excluded.  Circumferential epidural hematoma greater on the left extends from C1-T4.  This causes narrowing of the thecal sac and slight impression upon the cord.  Loss of visualized cerebrospinal fluid/cord narrowing most prominent C4  through C7 and greater on the left.  Prevertebral hematoma extends from upper C5 to the lower T2 level.  C2-C4 fusion.  C5-6 fusion.  Fusion of the posterior elements of C7-T1.  Paraspinal edema/hemorrhage consistent with soft tissue injury extends from the occiput to the T3 level.  Interspinous ligament injury may be present.  Cystic changes within the dens.  This appears chronic.  Transverse ligament hypertrophy.  Baseline narrowed canal at the cervical medullary junction to which epidural hematoma further contributes.  Images reviewed with Dr. Wynetta Emery 03/07/2013 for 4:05 p.m.   Original Report Authenticated By: Lacy Duverney, M.D.    Dg Chest Port 1 View  03/07/2013  *RADIOLOGY REPORT*  Clinical Data: Mower accident with cervical spine fracture and paraplegia.  PORTABLE CHEST - 1 VIEW  Comparison: 08/12/2012.  Findings: Stable borderline enlarged cardiac  silhouette.  Increased density at the left lateral lung base.  Increased prominence of the pulmonary vasculature and interstitial markings.  The previously suspected right upper lung nodule is not currently visualized. Bilateral calcified breast implant capsules are again noted. Cervical spine fixation hardware.  Thoracic spine degenerative changes.  IMPRESSION:  1.  Interval pneumonia, atelectasis and/or pleural fluid at the left lateral lung base. 2.  Interval pulmonary vascular congestion and mild interstitial pulmonary edema.   Original Report Authenticated By: Beckie Salts, M.D.    Dg C-arm Gt 120 Min  03/07/2013  *RADIOLOGY REPORT*  Clinical Data: Fracture.  Trauma.  CERVICAL SPINE - 2-3 VIEW,DG C-ARM GT 120 MIN  Fluoroscopic time:  24 seconds.  Comparison: MR same date.  Findings: Three intraoperative C-arm views submitted for review after surgery.  Posterior fusion and C4-C7.  C7 fracture is are poorly delineated secondary to patient's overlying shoulders.  Surgical sponges in place.  IMPRESSION: Fusion C4-C7. This can be evaluated on follow-up as present  exam is limited by patient's shoulders.   Original Report Authenticated By: Lacy Duverney, M.D.     Anti-infectives: Anti-infectives   Start     Dose/Rate Route Frequency Ordered Stop   03/07/13 2359  ceFAZolin (ANCEF) IVPB 1 g/50 mL premix     1 g 100 mL/hr over 30 Minutes Intravenous Every 8 hours 03/07/13 2245     03/07/13 1736  bacitracin 50,000 Units in sodium chloride irrigation 0.9 % 500 mL irrigation  Status:  Discontinued       As needed 03/07/13 1809 03/07/13 2026   03/07/13 1658  bacitracin 78295 UNITS injection    Comments:  AYDELETTE, JAMIE: cabinet override      03/07/13 1658 03/08/13 0514   03/07/13 1642  ceFAZolin (ANCEF) 2-3 GM-% IVPB SOLR    Comments:  FRIEDMAN, SCOTT: cabinet override      03/07/13 1642 03/07/13 1728       Assessment/Plan Fall mowing lawn C6-7 fracture dislocation with 40-50% subluxation: incomplete quadraplegic, should be able to start diet today, PT/OT ordered, left hip film due to pain  FEN: clears and advance as tolerated VTE: SCDs Dispo: will likely need rehab/snf placement   LOS: 1 day    Tikita Mabee 03/08/2013
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
## 2013-03-08 NOTE — Progress Notes (Signed)
Orthopedic Tech Progress Note Patient Details:  Cassandra Walker 1932-07-29 [id number]  Patient ID: Cassandra Walker, female   DOB: 11/10/1931, 79 y.o.   MRN: [id number]   Cassandra Walker 03/08/2013, 1:53 PM CALLED BIO-TECH.

## 2013-03-08 NOTE — Progress Notes (Signed)
Patient is stable.  Not interested in eating.  No nausea or vomiting.  Limited activity per NSurgery. This patient has been seen and I agree with the findings and treatment plan.  Marta Lamas. Gae Bon, MD, FACS [phone] (pager) [phone] (direct pager) Trauma Surgeon

## 2013-03-09 ENCOUNTER — Inpatient Hospital Stay (HOSPITAL_COMMUNITY): Payer: Medicare Other

## 2013-03-09 ENCOUNTER — Encounter (HOSPITAL_COMMUNITY): Payer: Self-pay | Admitting: Neurosurgery

## 2013-03-09 DIAGNOSIS — R7309 Other abnormal glucose: Secondary | ICD-10-CM

## 2013-03-09 LAB — GLUCOSE, CAPILLARY
Glucose-Capillary: 139 mg/dL — ABNORMAL HIGH (ref 70–99)
Glucose-Capillary: 122 mg/dL — ABNORMAL HIGH (ref 70–99)

## 2013-03-09 MED ORDER — INSULIN ASPART 100 UNIT/ML ~~LOC~~ SOLN
0.0000 [IU] | Freq: Three times a day (TID) | SUBCUTANEOUS | Status: DC
  Administered 2013-03-09 – 2013-03-10 (×4): 2 [IU] via SUBCUTANEOUS
  Administered 2013-03-10: 3 [IU] via SUBCUTANEOUS
  Administered 2013-03-11: 2 [IU] via SUBCUTANEOUS
  Administered 2013-03-11: 3 [IU] via SUBCUTANEOUS
  Administered 2013-03-12: 1 [IU] via SUBCUTANEOUS

## 2013-03-09 MED ORDER — TIZANIDINE HCL 4 MG PO TABS
4.0000 mg | ORAL_TABLET | Freq: Three times a day (TID) | ORAL | Status: DC | PRN
  Filled 2013-03-09: qty 1

## 2013-03-09 MED ORDER — INSULIN ASPART 100 UNIT/ML ~~LOC~~ SOLN: 0.0000 [IU] | Freq: Every day | SUBCUTANEOUS | Status: DC

## 2013-03-09 MED ORDER — OXYCODONE HCL 5 MG PO TABS
5.0000 mg | ORAL_TABLET | ORAL | Status: DC | PRN
  Administered 2013-03-09 – 2013-03-10 (×2): 5 mg via ORAL
  Administered 2013-03-11 (×2): 10 mg via ORAL
  Administered 2013-03-12: 5 mg via ORAL
  Administered 2013-03-12 – 2013-03-13 (×5): 10 mg via ORAL
  Administered 2013-03-14 (×3): 5 mg via ORAL
  Administered 2013-03-15 (×2): 10 mg via ORAL
  Administered 2013-03-15: 5 mg via ORAL
  Administered 2013-03-15 – 2013-03-16 (×3): 10 mg via ORAL
  Filled 2013-03-09 (×2): qty 2
  Filled 2013-03-09: qty 1
  Filled 2013-03-09: qty 2
  Filled 2013-03-09: qty 1
  Filled 2013-03-09 (×3): qty 2
  Filled 2013-03-09: qty 1
  Filled 2013-03-09 (×4): qty 2
  Filled 2013-03-09 (×2): qty 1
  Filled 2013-03-09: qty 2
  Filled 2013-03-09 (×2): qty 1
  Filled 2013-03-09 (×3): qty 2

## 2013-03-09 MED ORDER — LEVOTHYROXINE SODIUM 100 MCG IV SOLR
37.5000 ug | Freq: Every day | INTRAVENOUS | Status: DC
  Administered 2013-03-10 – 2013-03-12 (×3): 37.5 ug via INTRAVENOUS
  Filled 2013-03-09 (×4): qty 5
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
## 2013-03-09 NOTE — Progress Notes (Addendum)
Patient ID: Cassandra Walker, female   DOB: Sep 19, 1932, 79 y.o.   MRN: [id number] 2 Days Post-Op  Subjective: MM spasms LLE, some nausea yesterday  Objective: Vital signs in last 24 hours: Temp:  [98.6 F (37 C)-99.6 F (37.6 C)] 98.8 F (37.1 C) (05/05 0354) Pulse Rate:  [66-98] 90 (05/05 0700) Resp:  [9-22] 19 (05/05 0700) BP: (140-164)/(51-121) 157/69 mmHg (05/05 0700) SpO2:  [93 %-100 %] 98 % (05/05 0700) Weight:  [76.2 kg (167 lb 15.9 oz)] 76.2 kg (167 lb 15.9 oz) (05/05 0400) Last BM Date: 03/06/13  Intake/Output from previous day: 05/04 0701 - 05/05 0700 In: 2450 [P.O.:500; I.V.:1800; IV Piggyback:150] Out: 2020 [Urine:1695; Drains:325] Intake/Output this shift:    General appearance: alert and cooperative Neck: collar in place Resp: clear to auscultation bilaterally Chest wall: no tenderness Cardio: regular rate and rhythm GI: soft, NT, +BS, mild dist Neuro: RUE prox 4+/5, grip 3/5, LUE porx 4/5. grip 1/5, RLE prox and dist 4+/5. LLE prox 4/5, no dorsiflexion  Lab Results: CBC   Recent Labs  03/07/13 2115 03/08/13 0447  WBC 12.3* 14.2*  HGB 11.1* 10.8*  HCT 32.2* 31.2*  PLT 208 236   BMET  Recent Labs  03/07/13 2115 03/08/13 0447  NA 137 140  K 3.8 4.1  CL 101 102  CO2 25 26  GLUCOSE 194* 191*  BUN 25* 25*  CREATININE 1.19* 1.16*  CALCIUM 8.5 8.5   PT/INR  Recent Labs  03/07/13 2115  LABPROT 13.0  INR 0.99   ABG No results found for this basename: PHART, PCO2, PO2, HCO3,  in the last 72 hours  Studies/Results: Dg Cervical Spine 2-3 Views  03/07/2013  *RADIOLOGY REPORT*  Clinical Data: Fracture.  Trauma.  CERVICAL SPINE - 2-3 VIEW,DG C-ARM GT 120 MIN  Fluoroscopic time:  24 seconds.  Comparison: MR same date.  Findings: Three intraoperative C-arm views submitted for review after surgery.  Posterior fusion and C4-C7.  C7 fracture is are poorly delineated secondary to patient's overlying shoulders.  Surgical sponges in place.  IMPRESSION:  Fusion C4-C7. This can be evaluated on follow-up as present exam is limited by patient's shoulders.   Original Report Authenticated By: Lacy Duverney, M.D.    Ct Head Wo Contrast  03/07/2013  *RADIOLOGY REPORT*  Clinical Data: Injury post fall  CT HEAD WITHOUT CONTRAST,CT CERVICAL SPINE WITHOUT CONTRAST  Technique:  Contiguous axial images were obtained from the base of the skull through the vertex without contrast.,Technique: Multidetector CT imaging of the cervical spine was performed. Multiplanar CT image reconstructions were also generated.  Comparison: 10/16/12  Findings: No skull fracture is noted.  Stable cerebral atrophy. Stable periventricular and subcortical chronic white matter disease.  Ventricular size is stable from prior exam.  No acute infarction.  No mass lesion is noted on this unenhanced scan.  No intracranial hemorrhage, mass effect or midline shift.  IMPRESSION: No acute intracranial abnormality.  CT cervical spine without IV contrast  Comparison exam:  10/16/2012  Findings:  There is diffuse osteopenia.  Degenerative changes are noted C1-C2 articulation.  Cystic degenerative changes are noted within the C2 odontoid.  Again noted disc space flattening with large anterior osteophytes at C4-C5 level.  There is mild displaced fracture spinous process of C5 vertebral body.  There is a displaced oblique fracture of the C6 vertebral body.  Prevertebral soft tissue swelling is noted at this level.  There is about 6.5 mm separation with anterolisthesis of the superior anterior aspect of the  C6 vertebral body.  This is best seen in sagittal image 24.  There is  stenosis of the spinal canal at this level.  No definite intraspinal hematoma. Correlation with MRI is recommended as clinically warranted. Anterior osteophytes and disc space flattening noted at C7T1 level.  There is no pneumothorax in visualized lung apices.  Bilateral apical scarring.  Impression: 1.There is mild displaced fracture spinous  process of C5 vertebral body.  There is a displaced oblique fracture of the C6 vertebral body.  Prevertebral soft tissue swelling is noted at this level. There is about 6.5 mm separation with anterolisthesis of the superior anterior aspect of the C6 vertebral body.  This is best seen in sagittal image 24.  There is  stenosis of the spinal canal at this level.  No definite intraspinal hematoma.  Correlation with MRI is recommended as clinically warranted.  Anterior osteophytes and disc space flattening noted at C7T1 level.  Critical findings discussed with Dr. Hyacinth Meeker.   Original Report Authenticated By: Natasha Mead, M.D.    Ct Cervical Spine Wo Contrast  03/07/2013  *RADIOLOGY REPORT*  Clinical Data: Injury post fall  CT HEAD WITHOUT CONTRAST,CT CERVICAL SPINE WITHOUT CONTRAST  Technique:  Contiguous axial images were obtained from the base of the skull through the vertex without contrast.,Technique: Multidetector CT imaging of the cervical spine was performed. Multiplanar CT image reconstructions were also generated.  Comparison: 10/16/12  Findings: No skull fracture is noted.  Stable cerebral atrophy. Stable periventricular and subcortical chronic white matter disease.  Ventricular size is stable from prior exam.  No acute infarction.  No mass lesion is noted on this unenhanced scan.  No intracranial hemorrhage, mass effect or midline shift.  IMPRESSION: No acute intracranial abnormality.  CT cervical spine without IV contrast  Comparison exam:  10/16/2012  Findings:  There is diffuse osteopenia.  Degenerative changes are noted C1-C2 articulation.  Cystic degenerative changes are noted within the C2 odontoid.  Again noted disc space flattening with large anterior osteophytes at C4-C5 level.  There is mild displaced fracture spinous process of C5 vertebral body.  There is a displaced oblique fracture of the C6 vertebral body.  Prevertebral soft tissue swelling is noted at this level.  There is about 6.5 mm  separation with anterolisthesis of the superior anterior aspect of the C6 vertebral body.  This is best seen in sagittal image 24.  There is  stenosis of the spinal canal at this level.  No definite intraspinal hematoma. Correlation with MRI is recommended as clinically warranted. Anterior osteophytes and disc space flattening noted at C7T1 level.  There is no pneumothorax in visualized lung apices.  Bilateral apical scarring.  Impression: 1.There is mild displaced fracture spinous process of C5 vertebral body.  There is a displaced oblique fracture of the C6 vertebral body.  Prevertebral soft tissue swelling is noted at this level. There is about 6.5 mm separation with anterolisthesis of the superior anterior aspect of the C6 vertebral body.  This is best seen in sagittal image 24.  There is  stenosis of the spinal canal at this level.  No definite intraspinal hematoma.  Correlation with MRI is recommended as clinically warranted.  Anterior osteophytes and disc space flattening noted at C7T1 level.  Critical findings discussed with Dr. Hyacinth Meeker.   Original Report Authenticated By: Natasha Mead, M.D.    Mr Cervical Spine Wo Contrast  03/07/2013  *RADIOLOGY REPORT*  Clinical Data: Fall from [employer].  Fracture.  MRI CERVICAL SPINE WITHOUT  CONTRAST  Technique:  Multiplanar and multiecho pulse sequences of the cervical spine, to include the craniocervical junction and cervicothoracic junction, were obtained according to standard protocol without intravenous contrast.  Comparison: 03/07/2013 CT.  Findings: C2-C4 fusion.  C5-6 fusion.  Fusion of the posterior elements of C7-T1.  Anterior wedge compression fracture of the C7 vertebral body with 60% loss of height.  Fracture extends towards the pedicle region and may extend into the right C7 pedicle.  Bilateral C6-7 facet fractures with jumped (anterior displaced) C6 facet with respect to the C7 facet.  Relative retrolisthesis of the compressed to C7 vertebral body  secondary to the anterior slip of C6.  The cord is compressed.  Within the compressed cord there is increased signal suggesting edema.  Small amount of cord hemorrhage not excluded.  Circumferential epidural hematoma greater on the left extends from C1-T4.  This causes narrowing of the thecal sac and slight impression upon the cord.  Loss of visualized cerebrospinal fluid/cord narrowing most prominent C4 through C7 and greater on the left.  Flow voids within the vertebral arteries indicating patency.  Prevertebral hematoma extends from upper C5 to the lower T2 level.  Paraspinal edema/hemorrhage consistent with soft tissue injury extends from the occiput to the T3 level.  Interspinous ligament injury may be present.  Cystic changes within the dens.  This appears chronic.  Transverse ligament hypertrophy.  Baseline narrowed canal at the cervical medullary junction to which epidural hematoma further contributes.  IMPRESSION: Anterior wedge compression fracture of the C7 vertebral body with 60% loss of height.  Fracture extends towards the pedicle region and may extend into the right C7 pedicle.  Bilateral C6-7 facet fractures with jumped (anterior displaced) C6 facet with respect to the C7 facet.  Relative retrolisthesis of the compressed to C7 vertebral body secondary to the anterior slip of C6.  The cord is compressed.  Within the compressed cord there is increased signal suggesting edema.  Small amount of cord hemorrhage not excluded.  Circumferential epidural hematoma greater on the left extends from C1-T4.  This causes narrowing of the thecal sac and slight impression upon the cord.  Loss of visualized cerebrospinal fluid/cord narrowing most prominent C4 through C7 and greater on the left.  Prevertebral hematoma extends from upper C5 to the lower T2 level.  C2-C4 fusion.  C5-6 fusion.  Fusion of the posterior elements of C7-T1.  Paraspinal edema/hemorrhage consistent with soft tissue injury extends from the  occiput to the T3 level.  Interspinous ligament injury may be present.  Cystic changes within the dens.  This appears chronic.  Transverse ligament hypertrophy.  Baseline narrowed canal at the cervical medullary junction to which epidural hematoma further contributes.  Images reviewed with Dr. Wynetta Emery 03/07/2013 for 4:05 p.m.   Original Report Authenticated By: Lacy Duverney, M.D.    Dg Chest Port 1 View  03/07/2013  *RADIOLOGY REPORT*  Clinical Data: Mower accident with cervical spine fracture and paraplegia.  PORTABLE CHEST - 1 VIEW  Comparison: 08/12/2012.  Findings: Stable borderline enlarged cardiac silhouette.  Increased density at the left lateral lung base.  Increased prominence of the pulmonary vasculature and interstitial markings.  The previously suspected right upper lung nodule is not currently visualized. Bilateral calcified breast implant capsules are again noted. Cervical spine fixation hardware.  Thoracic spine degenerative changes.  IMPRESSION:  1.  Interval pneumonia, atelectasis and/or pleural fluid at the left lateral lung base. 2.  Interval pulmonary vascular congestion and mild interstitial pulmonary edema.  Original Report Authenticated By: Beckie Salts, M.D.    Dg Hip Portable 1 View Left  03/08/2013  *RADIOLOGY REPORT*  Clinical Data: Left hip pain  PORTABLE LEFT HIP - 1 VIEW  Comparison: 01/17/2012  Findings: Single portable view of the left hip submitted.  No gross fracture or subluxation.  Degenerative changes are noted with diffuse narrowing of joint space.  Superior acetabular spurring.  IMPRESSION: No gross fracture or subluxation.  Degenerative changes with diffuse narrowing of joint space.  Superior acetabular spurring.   Original Report Authenticated By: Natasha Mead, M.D.    Dg C-arm Gt 120 Min  03/07/2013  *RADIOLOGY REPORT*  Clinical Data: Fracture.  Trauma.  CERVICAL SPINE - 2-3 VIEW,DG C-ARM GT 120 MIN  Fluoroscopic time:  24 seconds.  Comparison: MR same date.  Findings: Three  intraoperative C-arm views submitted for review after surgery.  Posterior fusion and C4-C7.  C7 fracture is are poorly delineated secondary to patient's overlying shoulders.  Surgical sponges in place.  IMPRESSION: Fusion C4-C7. This can be evaluated on follow-up as present exam is limited by patient's shoulders.   Original Report Authenticated By: Lacy Duverney, M.D.     Anti-infectives: Anti-infectives   Start     Dose/Rate Route Frequency Ordered Stop   03/07/13 2359  ceFAZolin (ANCEF) IVPB 1 g/50 mL premix     1 g 100 mL/hr over 30 Minutes Intravenous Every 8 hours 03/07/13 2245     03/07/13 1736  bacitracin 50,000 Units in sodium chloride irrigation 0.9 % 500 mL irrigation  Status:  Discontinued       As needed 03/07/13 1809 03/07/13 2026   03/07/13 1658  bacitracin 16109 UNITS injection    Comments:  AYDELETTE, JAMIE: cabinet override      03/07/13 1658 03/08/13 0514   03/07/13 1642  ceFAZolin (ANCEF) 2-3 GM-% IVPB SOLR    Comments:  FRIEDMAN, SCOTT: cabinet override      03/07/13 1642 03/07/13 1728      Assessment/Plan: s/p Procedure(s): Cervical four -seven POSTERIOR CERVICAL FUSION Fall from lawnmower C5,6 fx with cord injury - S/P post fusion, collar, brace and mobilization per Dr. Wynetta Emery FEN - continue clears until nausea better VTE - PAS, check with NS re lovenox Hyperglycemia - start SSI  LOS: 2 days    Violeta Gelinas, MD, MPH, FACS Pager: [phone]  03/09/2013
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
## 2013-03-09 NOTE — Progress Notes (Signed)
UR completed 

## 2013-03-10 DIAGNOSIS — I10 Essential (primary) hypertension: Secondary | ICD-10-CM

## 2013-03-10 DIAGNOSIS — G8254 Quadriplegia, C5-C7 incomplete: Secondary | ICD-10-CM | POA: Diagnosis present

## 2013-03-10 LAB — BASIC METABOLIC PANEL
BUN: 24 mg/dL — ABNORMAL HIGH (ref 6–23)
GFR calc Af Amer: 74 mL/min — ABNORMAL LOW (ref >90)
GFR calc non Af Amer: 64 mL/min — ABNORMAL LOW (ref >90)
Potassium: 3.8 mEq/L (ref 3.5–5.1)
Sodium: 139 mEq/L (ref 135–145)

## 2013-03-10 LAB — GLUCOSE, CAPILLARY: Glucose-Capillary: 156 mg/dL — ABNORMAL HIGH (ref 70–99)

## 2013-03-10 MED ORDER — METOPROLOL TARTRATE 1 MG/ML IV SOLN
5.0000 mg | INTRAVENOUS | Status: DC | PRN
  Administered 2013-03-11 (×2): 5 mg via INTRAVENOUS
  Filled 2013-03-10 (×2): qty 5

## 2013-03-10 MED ORDER — IPRATROPIUM-ALBUTEROL 20-100 MCG/ACT IN AERS: 2.0000 | INHALATION_SPRAY | Freq: Four times a day (QID) | RESPIRATORY_TRACT | Status: DC | PRN

## 2013-03-10 MED ORDER — PROCHLORPERAZINE EDISYLATE 5 MG/ML IJ SOLN
10.0000 mg | Freq: Four times a day (QID) | INTRAMUSCULAR | Status: DC | PRN
  Administered 2013-03-10 – 2013-03-12 (×4): 10 mg via INTRAVENOUS
  Filled 2013-03-10 (×4): qty 2

## 2013-03-10 MED ORDER — CLONIDINE HCL 0.1 MG PO TABS
0.1000 mg | ORAL_TABLET | Freq: Every day | ORAL | Status: DC
  Administered 2013-03-10 – 2013-03-15 (×6): 0.1 mg via ORAL
  Filled 2013-03-10 (×7): qty 1

## 2013-03-10 MED ORDER — BOOST / RESOURCE BREEZE PO LIQD
1.0000 | Freq: Three times a day (TID) | ORAL | Status: DC
  Administered 2013-03-10 – 2013-03-17 (×13): 1 via ORAL
  Filled 2013-03-10 (×3): qty 1
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
## 2013-03-10 NOTE — Progress Notes (Signed)
Subjective: Patient reports Doing well spasms but otherwise pains well-controlled  Objective: Vital signs in last 24 hours: Temp:  [98.3 F (36.8 C)-99.4 F (37.4 C)] 99.4 F (37.4 C) (05/06 0406) Pulse Rate:  [67-91] 75 (05/06 0900) Resp:  [10-24] 18 (05/06 0900) BP: (157-181)/(61-83) 176/73 mmHg (05/06 0900) SpO2:  [96 %-99 %] 98 % (05/06 0900)  Intake/Output from previous day: 05/05 0701 - 05/06 0700 In: 1062.1 [P.O.:220; I.V.:692.1; IV Piggyback:150] Out: 1690 [Urine:1690] Intake/Output this shift: Total I/O In: 700 [I.V.:650; IV Piggyback:50] Out: 25 [Urine:25]  Neurologically stable from yesterday still shows significant signs of improvement the left looks to be function left hand and grip still very weak  Lab Results:  Recent Labs  03/07/13 2115 03/08/13 0447  WBC 12.3* 14.2*  HGB 11.1* 10.8*  HCT 32.2* 31.2*  PLT 208 236   BMET  Recent Labs  03/08/13 0447 03/10/13 0540  NA 140 139  K 4.1 3.8  CL 102 104  CO2 26 28  GLUCOSE 191* 139*  BUN 25* 24*  CREATININE 1.16* 0.85  CALCIUM 8.5 8.1*    Studies/Results: Dg Cervical Spine 1 View  03/09/2013  *RADIOLOGY REPORT*  Clinical Data: Cervical fracture  DG CERVICAL SPINE - 1 VIEW  Comparison: 03/07/2013  Findings: Two portable cross-table radiographs are submitted. Posterior fixation hardware C4 to C7 as previously seen.  The lower cervical spine and cervicothoracic junction are not well visualized.  IMPRESSION:  Stable postoperative changes, with limited visualization of the lower cervical spine.   Original Report Authenticated By: D. Andria Rhein, MD    Dg Cerv Spine 1v Clearing  03/09/2013  *RADIOLOGY REPORT*  Clinical Data: Posterior cervical fusion.  LIMITED CERVICAL SPINE FOR TRAUMA CLEARING - 1 VIEW  Comparison: Intraoperative views of the cervical spine earlier this same day.  Findings: Single lateral view of the cervical spine is provided. Image demonstrates posterior pedicle screws and stabilization  bars from C4-C7.  No fracture or subluxation is identified.  IMPRESSION: C4-7 posterior fusion.   Original Report Authenticated By: Holley Dexter, M.D.    Dg Hip Portable 1 View Left  03/08/2013  *RADIOLOGY REPORT*  Clinical Data: Left hip pain  PORTABLE LEFT HIP - 1 VIEW  Comparison: 01/17/2012  Findings: Single portable view of the left hip submitted.  No gross fracture or subluxation.  Degenerative changes are noted with diffuse narrowing of joint space.  Superior acetabular spurring.  IMPRESSION: No gross fracture or subluxation.  Degenerative changes with diffuse narrowing of joint space.  Superior acetabular spurring.   Original Report Authenticated By: Natasha Mead, M.D.     Assessment/Plan: Postop day 3 from cervical stabilization procedure patient in good neurologic recovery her x-ray shows stable alignment okay to slowly mobilize the patient in her brace  LOS: 3 days     Judi Jaffe P 03/10/2013, 10:07 AM

## 2013-03-11 DIAGNOSIS — IMO0002 Reserved for concepts with insufficient information to code with codable children: Secondary | ICD-10-CM

## 2013-03-11 LAB — BASIC METABOLIC PANEL WITH GFR
BUN: 22 mg/dL (ref 6–23)
CO2: 27 meq/L (ref 19–32)
Calcium: 7.8 mg/dL — ABNORMAL LOW (ref 8.4–10.5)
Chloride: 100 meq/L (ref 96–112)
Creatinine, Ser: 0.79 mg/dL (ref 0.50–1.10)
GFR calc Af Amer: >90 mL/min
GFR calc non Af Amer: 78 mL/min — ABNORMAL LOW
Glucose, Bld: 131 mg/dL — ABNORMAL HIGH (ref 70–99)
Potassium: 3.4 meq/L — ABNORMAL LOW (ref 3.5–5.1)
Sodium: 136 meq/L (ref 135–145)

## 2013-03-11 LAB — GLUCOSE, CAPILLARY

## 2013-03-11 MED ORDER — METOPROLOL TARTRATE 1 MG/ML IV SOLN
5.0000 mg | Freq: Three times a day (TID) | INTRAVENOUS | Status: DC
  Administered 2013-03-11 – 2013-03-12 (×2): 5 mg via INTRAVENOUS
  Filled 2013-03-11 (×3): qty 5

## 2013-03-11 MED ORDER — LISINOPRIL 20 MG PO TABS
20.0000 mg | ORAL_TABLET | Freq: Every day | ORAL | Status: DC
  Filled 2013-03-11: qty 1

## 2013-03-11 MED ORDER — HYDROCHLOROTHIAZIDE 25 MG PO TABS
25.0000 mg | ORAL_TABLET | Freq: Every day | ORAL | Status: DC
  Administered 2013-03-11 – 2013-03-17 (×7): 25 mg via ORAL
  Filled 2013-03-11 (×7): qty 1

## 2013-03-11 MED ORDER — LISINOPRIL 40 MG PO TABS
40.0000 mg | ORAL_TABLET | Freq: Every day | ORAL | Status: DC
  Administered 2013-03-11 – 2013-03-17 (×7): 40 mg via ORAL
  Filled 2013-03-11 (×7): qty 1

## 2013-03-11 MED ORDER — LISINOPRIL-HYDROCHLOROTHIAZIDE 20-12.5 MG PO TABS: 1.0000 | ORAL_TABLET | Freq: Every day | ORAL | Status: DC

## 2013-03-11 MED ORDER — HYDROCHLOROTHIAZIDE 12.5 MG PO CAPS
12.5000 mg | ORAL_CAPSULE | Freq: Every day | ORAL | Status: DC
  Filled 2013-03-11: qty 1

## 2013-03-11 NOTE — Progress Notes (Signed)
Not sure of the etiology of her nausea, but she is not getting lots of pain medication.  Will continue current management and try to start therapies.  This patient has been seen and I agree with the findings and treatment plan.  Marta Lamas. Gae Bon, MD, FACS [phone] (pager) [phone] (direct pager) Trauma Surgeon

## 2013-03-11 NOTE — Progress Notes (Signed)
Patient ID: Cassandra Walker, female   DOB: 1932/10/28, 79 y.o.   MRN: [id number]   LOS: 4 days   Subjective: Pt still feeling quite nauseated, antiemetics are helping.  Unsure of flatus, no bm.  States she was unable to void yesterday, in and out cath.  Denies headaches, vision changes, chest pains or palpitations.  Has not been out of bed yet.  Pain is under good control according to the patient.  Objective: Vital signs in last 24 hours: Temp:  [98.4 F (36.9 C)-99.7 F (37.6 C)] 98.7 F (37.1 C) (05/07 0802) Pulse Rate:  [61-97] 80 (05/07 0820) Resp:  [10-25] 15 (05/07 0820) BP: (159-202)/(56-154) 192/77 mmHg (05/07 0820) SpO2:  [96 %-99 %] 96 % (05/07 0820) Last BM Date: 03/06/13  I/O last 3 completed shifts: In: 1450 [I.V.:1250; IV Piggyback:200] Out: 3025 [Urine:3025] Total I/O In: 700 [I.V.:650; IV Piggyback:50] Out: -     Lab Results:  CBC No results found for this basename: WBC, HGB, HCT, PLT,  in the last 72 hours BMET  Recent Labs  03/10/13 0540 03/11/13 0430  NA 139 136  K 3.8 3.4*  CL 104 100  CO2 28 27  GLUCOSE 139* 131*  BUN 24* 22  CREATININE 0.85 0.79  CALCIUM 8.1* 7.8*    Imaging: Dg Cervical Spine 1 View  03/09/2013  *RADIOLOGY REPORT*  Clinical Data: Cervical fracture  DG CERVICAL SPINE - 1 VIEW  Comparison: 03/07/2013  Findings: Two portable cross-table radiographs are submitted. Posterior fixation hardware C4 to C7 as previously seen.  The lower cervical spine and cervicothoracic junction are not well visualized.  IMPRESSION:  Stable postoperative changes, with limited visualization of the lower cervical spine.   Original Report Authenticated By: D. Andria Rhein, MD    Dg Cerv Spine 1v Clearing  03/09/2013  *RADIOLOGY REPORT*  Clinical Data: Posterior cervical fusion.  LIMITED CERVICAL SPINE FOR TRAUMA CLEARING - 1 VIEW  Comparison: Intraoperative views of the cervical spine earlier this same day.  Findings: Single lateral view of the cervical spine  is provided. Image demonstrates posterior pedicle screws and stabilization bars from C4-C7.  No fracture or subluxation is identified.  IMPRESSION: C4-7 posterior fusion.   Original Report Authenticated By: Holley Dexter, M.D.    Physical exam: General appearance: alert and cooperative  Neck: cto  Resp: clear to auscultation bilaterally  Cardio: S1S2 regular rate and rhythm, no murmurs, gallops or rubs.  No edema.  +2 bilateral pedal pulses. GI: soft, active BS, NT.  No masses, hernias or organomegaly. Neuro: L grip 1/5, L proximals 3/5R grip and proximals 4/5, RLE prox and distal 4/5, LLE no dorsiflexion, proximal and distal 4/5   Patient Active Problem List   Diagnosis Date Noted  . Fall 03/10/2013  . Fracture of C5 vertebra, closed 03/10/2013  . C6 cervical fracture 03/10/2013  . Incomplete quadriplegia at C5-6 level 03/10/2013  . HTN (hypertension) 03/10/2013  . Hyperglycemia 03/10/2013  . Pulmonary nodule 07/24/2011   Assessment/Plan:  s/p Procedure(s):  Cervical four -seven POSTERIOR CERVICAL FUSION  Fall from lawnmower  C5,6 fx with cord injury - S/P post fusion, collar, brace and mobilization per Dr. Wynetta Emery  FEN - continue clears until nausea better. Good bowel sounds.  Continue with antiemetics.  VTE - PAS, lovenox POD 5 per Dr. Wynetta Emery.  PT/OT ordered, mobilize slowly. Hyperglycemia - SSI  HTN - remains elevated.  Resume lisinopril/hctz.  Repeat bmp in am Transfer to 4N when OK with NS   Jeannelle Wiens  Rupa Lagan, ANP-BC Pager: [phone] General Trauma PA Pager: [phone]   03/11/2013

## 2013-03-11 NOTE — Consult Note (Addendum)
Physical Medicine and Rehabilitation Consult  Reason for Consult: Incomplete quadriplegia Referring Physician:  Trauma MD.   HPI: Cassandra Walker is a 79 y.o. female was on a riding lawnmower, put it in drive instead of reverse and ended up hitting a swing set and was launched over the handlebars striking her head and neck.  Initially had difficulty moving was admitted on 03/07/13. She was  noted be significantly weaker lower extremities as well as and patchy weakness throughout her upper extremities. She  had complaints of severe neck pain with shocking sensations in back and numbness in BUE/BLE.  MRI C-spine revealed anterior wedge compression fracture of C7 vertebral body, bilateral C6-C7 facet fractures with jumped C6 facet, relative retrolisthesis of compressed C7 with cord compression and edema. She was taken to OR emergently for ORIF C6-C7 with mass fixation /fusion C4-C7 by Dr. Wynetta Emery and has been on bedrest. To be fitted for thoracic brace with cervical. She  has had improvement in strength but LE spasms noted. She has had problems with nausea and constipation therefore continues on clears. PT/OT evaluations pending.    Has not been out of bed yet. Has received cervical-thoracic orthosis Review of Systems  Constitutional: Negative.   HENT: Positive for neck pain.   Eyes: Negative.   Respiratory: Negative.   Cardiovascular: Negative.   Gastrointestinal: Negative.   Genitourinary: Negative.   Skin: Negative.   Neurological: Positive for tingling.  Endo/Heme/Allergies: Negative.   Psychiatric/Behavioral: Negative.    Past Medical History  Diagnosis Date  . Osteoarthritis   . Hyperlipidemia   . GERD (gastroesophageal reflux disease)   . Peripheral neuropathy   . Osteopenia   . DDD (degenerative disc disease)   . Shingles   . Colon cancer 1977  . Thyroid cancer 1966  . Peripheral vascular disease    Past Surgical History  Procedure Laterality Date  . Thyroidectomy    . Total  abdominal hysterectomy w/ bilateral salpingoophorectomy  1968  . Colon resection  1977  . Cervical laminectomy  1995  . Cataract extraction      bilateral  . Breast enhancement surgery      silicone  . Breast surgery    . Posterior cervical fusion/foraminotomy N/A 03/07/2013    Procedure: Cervical four -seven POSTERIOR CERVICAL FUSION;  Surgeon: Mariam Dollar, MD;  Location: [facility];  Service: Neurosurgery;  Laterality: N/A;   Family History  Problem Relation Age of Onset  . Lung cancer Father    Social History:  reports that she quit smoking about 40 years ago. She does not have any smokeless tobacco history on file. Her alcohol and drug histories are not on file.   Allergies  Allergen Reactions  . Codeine    Medications Prior to Admission  Medication Sig Dispense Refill  . ALPRAZolam (XANAX) 1 MG tablet Take 1 mg by mouth at bedtime as needed for sleep.      Marland Kitchen atenolol (TENORMIN) 100 MG tablet Take 100 mg by mouth daily.      Marland Kitchen atorvastatin (LIPITOR) 10 MG tablet Take 5 mg by mouth daily.        Marland Kitchen levothyroxine (SYNTHROID, LEVOTHROID) 75 MCG tablet Take 75 mcg by mouth daily.        Marland Kitchen lisinopril-hydrochlorothiazide (PRINZIDE,ZESTORETIC) 20-12.5 MG per tablet Take 2 tablets by mouth daily.      . meloxicam (MOBIC) 15 MG tablet Take 15 mg by mouth daily.      . Naproxen Sodium (ALEVE) 220 MG  CAPS Take by mouth as needed.          Home:    Functional History:   Functional Status:  Mobility:          ADL:    Cognition: Cognition Orientation Level: Oriented X4    Blood pressure 192/77, pulse 80, temperature 98.7 F (37.1 C), temperature source Oral, resp. rate 15, height 5\' 5"  (1.651 m), weight 76.2 kg (167 lb 15.9 oz), SpO2 96.00%. Physical Exam  Nursing note and vitals reviewed. Neuro: Absent sensation to pinprick in bilateral feet and bilateral hands with the exception of the right fifth digit. Intact to light touch and proprioception in both upper and lower  limbs Motor strength is 3 minus in the right deltoid, biceps, triceps, grip, hip flexor, knee extensors, ankle dorsiflexor plantar flexor 2 minus strength in the left deltoid, biceps, triceps, grip, hip flexor, knee extensors, ankle dorsiflexor and plantar flexor Lungs are clear to auscultation Heart regular rate and rhythm no rubs murmurs or extra sounds Abdomen mildly distended nontender to palpation hostile sounds Extremities no clubbing cyanosis or edema Neck with a cervical thoracic orthosis properly adjusted  Results for orders placed during the hospital encounter of 03/07/13 (from the past 24 hour(s))  GLUCOSE, CAPILLARY     Status: Abnormal   Collection Time    03/10/13 12:03 PM      Result Value Range   Glucose-Capillary 130 (*) 70 - 99 mg/dL  GLUCOSE, CAPILLARY     Status: Abnormal   Collection Time    03/10/13  4:54 PM      Result Value Range   Glucose-Capillary 138 (*) 70 - 99 mg/dL  GLUCOSE, CAPILLARY     Status: Abnormal   Collection Time    03/10/13  9:53 PM      Result Value Range   Glucose-Capillary 125 (*) 70 - 99 mg/dL  BASIC METABOLIC PANEL     Status: Abnormal   Collection Time    03/11/13  4:30 AM      Result Value Range   Sodium 136  135 - 145 mEq/L   Potassium 3.4 (*) 3.5 - 5.1 mEq/L   Chloride 100  96 - 112 mEq/L   CO2 27  19 - 32 mEq/L   Glucose, Bld 131 (*) 70 - 99 mg/dL   BUN 22  6 - 23 mg/dL   Creatinine, Ser 6.57  0.50 - 1.10 mg/dL   Calcium 7.8 (*) 8.4 - 10.5 mg/dL   GFR calc non Af Amer 78 (*) >90 mL/min   GFR calc Af Amer >90  >90 mL/min  GLUCOSE, CAPILLARY     Status: Abnormal   Collection Time    03/11/13  8:00 AM      Result Value Range   Glucose-Capillary 117 (*) 70 - 99 mg/dL   Dg Cervical Spine 1 View  03/09/2013  *RADIOLOGY REPORT*  Clinical Data: Cervical fracture  DG CERVICAL SPINE - 1 VIEW  Comparison: 03/07/2013  Findings: Two portable cross-table radiographs are submitted. Posterior fixation hardware C4 to C7 as previously  seen.  The lower cervical spine and cervicothoracic junction are not well visualized.  IMPRESSION:  Stable postoperative changes, with limited visualization of the lower cervical spine.   Original Report Authenticated By: D. Andria Rhein, MD    Dg Cerv Spine 1v Clearing  03/09/2013  *RADIOLOGY REPORT*  Clinical Data: Posterior cervical fusion.  LIMITED CERVICAL SPINE FOR TRAUMA CLEARING - 1 VIEW  Comparison: Intraoperative views of the cervical  spine earlier this same day.  Findings: Single lateral view of the cervical spine is provided. Image demonstrates posterior pedicle screws and stabilization bars from C4-C7.  No fracture or subluxation is identified.  IMPRESSION: C4-7 posterior fusion.   Original Report Authenticated By: Holley Dexter, M.D.     Assessment/Plan: Diagnosis: Incomplete tetraplegia secondary to spinal cord injury 1. Does the need for close, 24 hr/day medical supervision in concert with the patient's rehab needs make it unreasonable for this patient to be served in a less intensive setting? Potentially 2. Co-Morbidities requiring supervision/potential complications: Neurogenic bowel and bladder, hypertension, pain 3. Due to bladder management, bowel management, safety, skin/wound care, disease management, medication administration, pain management and patient education, does the patient require 24 hr/day rehab nursing? Potentially 4. Does the patient require coordinated care of a physician, rehab nurse, PT (1-2 hrs/day, 5 days/week) and OT (1-2 hrs/day, 5 days/week) to address physical and functional deficits in the context of the above medical diagnosis(es)? Potentially Addressing deficits in the following areas: balance, endurance, locomotion, strength, transferring, bowel/bladder control, bathing, dressing, feeding, grooming and toileting 5. Can the patient actively participate in an intensive therapy program of at least 3 hrs of therapy per day at least 5 days per week?  Potentially 6. The potential for patient to make measurable gains while on inpatient rehab is good 7. Anticipated functional outcomes upon discharge from inpatient rehab are Min assist wheelchair level mobility with PT, Min assist wheelchair level ADLs with OT, Not applicable with SLP. 8. Estimated rehab length of stay to reach the above functional goals is: 3 weeks 9. Does the patient have adequate social supports to accommodate these discharge functional goals? Potentially 10. Anticipated D/C setting: Home 11. Anticipated post D/C treatments: HH therapy 12. Overall Rehab/Functional Prognosis: good  RECOMMENDATIONS: This patient's condition is appropriate for continued rehabilitative care in the following setting: Will be appropriate for CIR when she is able to tolerate PT and OT out of bed in acute care Patient has agreed to participate in recommended program. Yes Note that insurance prior authorization may be required for reimbursement for recommended care.  Comment:    03/11/2013

## 2013-03-11 NOTE — Progress Notes (Signed)
Subjective: Patient reports Overall she's doing okay still has a spot her neck it hurts as they elevator sole take out very slowly still feels significant improvement in her arms and legs  Objective: Vital signs in last 24 hours: Temp:  [98.4 F (36.9 C)-99.7 F (37.6 C)] 98.7 F (37.1 C) (05/07 0802) Pulse Rate:  [61-97] 66 (05/07 0600) Resp:  [14-25] 22 (05/07 0600) BP: (159-189)/(56-154) 189/63 mmHg (05/07 0600) SpO2:  [96 %-99 %] 99 % (05/07 0600)  Intake/Output from previous day: 05/06 0701 - 05/07 0700 In: 1300 [I.V.:1150; IV Piggyback:150] Out: 2275 [Urine:2275] Intake/Output this shift:    Stable from yesterday incision clean and dry still significant lower extremity improvement from preop with persistent left grip weakness  Lab Results: No results found for this basename: WBC, HGB, HCT, PLT,  in the last 72 hours BMET  Recent Labs  03/10/13 0540 03/11/13 0430  NA 139 136  K 3.8 3.4*  CL 104 100  CO2 28 27  GLUCOSE 139* 131*  BUN 24* 22  CREATININE 0.85 0.79  CALCIUM 8.1* 7.8*    Studies/Results: Dg Cervical Spine 1 View  03/09/2013  *RADIOLOGY REPORT*  Clinical Data: Cervical fracture  DG CERVICAL SPINE - 1 VIEW  Comparison: 03/07/2013  Findings: Two portable cross-table radiographs are submitted. Posterior fixation hardware C4 to C7 as previously seen.  The lower cervical spine and cervicothoracic junction are not well visualized.  IMPRESSION:  Stable postoperative changes, with limited visualization of the lower cervical spine.   Original Report Authenticated By: D. Andria Rhein, MD    Dg Cerv Spine 1v Clearing  03/09/2013  *RADIOLOGY REPORT*  Clinical Data: Posterior cervical fusion.  LIMITED CERVICAL SPINE FOR TRAUMA CLEARING - 1 VIEW  Comparison: Intraoperative views of the cervical spine earlier this same day.  Findings: Single lateral view of the cervical spine is provided. Image demonstrates posterior pedicle screws and stabilization bars from C4-C7.  No  fracture or subluxation is identified.  IMPRESSION: C4-7 posterior fusion.   Original Report Authenticated By: Holley Dexter, M.D.     Assessment/Plan: Progress mobilization but take it slowly patient's having additional pain and she sitting up which is normal and expected however I want the patient's pain to be a guide, temperature I do not want to force her out of bed and she's feeling significant increased pain with mobilization.  LOS: 4 days     Treasure Ingrum P 03/11/2013, 8:20 AM

## 2013-03-12 DIAGNOSIS — E876 Hypokalemia: Secondary | ICD-10-CM

## 2013-03-12 LAB — CBC
Hemoglobin: 10.5 g/dL — ABNORMAL LOW (ref 12.0–15.0)
MCH: 28.2 pg (ref 26.0–34.0)
MCV: 81.2 fL (ref 78.0–100.0)
RBC: 3.72 MIL/uL — ABNORMAL LOW (ref 3.87–5.11)

## 2013-03-12 LAB — BASIC METABOLIC PANEL
CO2: 28 mEq/L (ref 19–32)
Calcium: 7.9 mg/dL — ABNORMAL LOW (ref 8.4–10.5)
Creatinine, Ser: 0.77 mg/dL (ref 0.50–1.10)
Glucose, Bld: 122 mg/dL — ABNORMAL HIGH (ref 70–99)

## 2013-03-12 LAB — GLUCOSE, CAPILLARY
Glucose-Capillary: 116 mg/dL — ABNORMAL HIGH (ref 70–99)
Glucose-Capillary: 131 mg/dL — ABNORMAL HIGH (ref 70–99)

## 2013-03-12 MED ORDER — POLYETHYLENE GLYCOL 3350 17 G PO PACK
17.0000 g | PACK | Freq: Two times a day (BID) | ORAL | Status: DC
  Administered 2013-03-12 – 2013-03-17 (×11): 17 g via ORAL
  Filled 2013-03-12 (×12): qty 1

## 2013-03-12 MED ORDER — BETHANECHOL CHLORIDE 25 MG PO TABS
25.0000 mg | ORAL_TABLET | Freq: Four times a day (QID) | ORAL | Status: DC
  Administered 2013-03-12 – 2013-03-16 (×20): 25 mg via ORAL
  Filled 2013-03-12 (×24): qty 1

## 2013-03-12 MED ORDER — ATENOLOL 100 MG PO TABS
100.0000 mg | ORAL_TABLET | Freq: Every day | ORAL | Status: DC
  Administered 2013-03-12 – 2013-03-17 (×6): 100 mg via ORAL
  Filled 2013-03-12 (×6): qty 1

## 2013-03-12 MED ORDER — LEVOTHYROXINE SODIUM 75 MCG PO TABS
75.0000 ug | ORAL_TABLET | Freq: Every day | ORAL | Status: DC
  Administered 2013-03-13 – 2013-03-17 (×5): 75 ug via ORAL
  Filled 2013-03-12 (×7): qty 1

## 2013-03-12 MED ORDER — POTASSIUM CHLORIDE CRYS ER 20 MEQ PO TBCR
40.0000 meq | EXTENDED_RELEASE_TABLET | Freq: Two times a day (BID) | ORAL | Status: AC
  Administered 2013-03-12 (×2): 40 meq via ORAL
  Filled 2013-03-12 (×2): qty 2

## 2013-03-12 MED ORDER — BISACODYL 10 MG RE SUPP: 10.0000 mg | Freq: Every day | RECTAL | Status: DC | PRN

## 2013-03-12 NOTE — Evaluation (Signed)
Occupational Therapy Evaluation Patient Details Name: Cassandra Walker MRN: [id number] DOB: Dec 28, 1931 Today's Date: 03/12/2013 Time: 9562-1308 OT Time Calculation (min): 25 min  OT Assessment / Plan / Recommendation Clinical Impression  79 yo female admitted C7 cord compression s/p fusion with Dr Wynetta Emery. pt now incomplete quadriplegic that could benefit from skilled OT acutely. Recommend CIR for d/c planning    OT Assessment  Patient needs continued OT Services    Follow Up Recommendations  CIR    Barriers to Discharge  (lives alone)    Geophysical data processor (measurements OT);Wheelchair cushion (measurements OT)    Recommendations for Other Services Rehab consult  Frequency  Min 3X/week    Precautions / Restrictions Precautions Precautions: Cervical;Back Required Braces or Orthoses: Other Brace/Splint Other Brace/Splint: cervical with thoracic back brace Restrictions Weight Bearing Restrictions: No   Pertinent Vitals/Pain 8 out 10 pain    ADL  Eating/Feeding: Set up Where Assessed - Eating/Feeding: Bed level Grooming: Wash/dry face;Set up Where Assessed - Grooming: Supported sitting Lower Body Dressing: +1 Total assistance Where Assessed - Lower Body Dressing: Supine, head of bed flat Transfers/Ambulation Related to ADLs: not appropriate at this time- did complete scoot transfer toward [facility] but not assisting with LOB  ADL Comments: Pt tolerated supine <>Sit eob with incr pain at EOB at this time to 8 out 10 premedicated. pt demonstrates BIL UE AROM at EOB and posterior LOB to the left side. Pt unable to support core    OT Diagnosis: Generalized weakness;Cognitive deficits;Acute pain;Paresis  OT Problem List: Decreased strength;Decreased range of motion;Decreased activity tolerance;Impaired balance (sitting and/or standing);Decreased coordination;Decreased cognition;Decreased safety awareness;Decreased knowledge of use of DME or AE;Decreased knowledge of  precautions;Pain;Impaired UE functional use;Obesity;Impaired sensation OT Treatment Interventions: Self-care/ADL training;DME and/or AE instruction;Neuromuscular education;Therapeutic exercise;Therapeutic activities;Cognitive remediation/compensation;Patient/family education;Balance training   OT Goals Acute Rehab OT Goals OT Goal Formulation: With patient Time For Goal Achievement: 03/26/13 Potential to Achieve Goals: Good ADL Goals Pt Will Perform Grooming: with set-up;Sitting, chair;with adaptive equipment ADL Goal: Grooming - Progress: Goal set today Pt Will Perform Upper Body Bathing: with set-up;Sit to stand in shower;Sitting, chair;with adaptive equipment ADL Goal: Upper Body Bathing - Progress: Goal set today Pt Will Perform Upper Body Dressing: with min assist;Sitting, chair;Supported ADL Goal: Location manager Dressing - Progress: Goal set today Pt Will Transfer to Toilet: with 2+ total assist;Stand pivot transfer;3-in-1 (30%R) ADL Goal: Toilet Transfer - Progress: Goal set today Pt Will Perform Toileting - Clothing Manipulation: with max assist;Sitting on 3-in-1 or toilet ADL Goal: Toileting - Clothing Manipulation - Progress: Goal set today Pt Will Perform Toileting - Hygiene: with max assist;Sitting on 3-in-1 or toilet ADL Goal: Toileting - Hygiene - Progress: Goal set today Miscellaneous OT Goals Miscellaneous OT Goal #1: Pt will complete bed mobility Mod (A) as precursor to adls OT Goal: Miscellaneous Goal #1 - Progress: Goal set today  Visit Information  Last OT Received On: 03/12/13 Assistance Needed: +2 PT/OT Co-Evaluation/Treatment: Yes    Subjective Data  Subjective: "several of them said they could help" Patient Stated Goal: to return home soon   Prior Functioning     Home Living Lives With: Alone Available Help at Discharge: Family Type of Home: House Home Access: Stairs to enter [employer] of Steps: 4 Entrance Stairs-Rails: None Home Layout:  One level Bathroom Shower/Tub: Tub/shower unit;Door Bathroom Toilet: Standard Home Adaptive Equipment: Walker - rolling Additional Comments: states family can potentially work to assist at home as needed  Prior Function Level of  Independence: Independent Able to Take Stairs?: Yes Driving: Yes Vocation: Retired Musician: No difficulties Dominant Hand: Right         Vision/Perception Vision - History Baseline Vision: Wears glasses all the time Patient Visual Report: No change from baseline   Cognition  Cognition Arousal/Alertness: Awake/alert Behavior During Therapy: WFL for tasks assessed/performed Overall Cognitive Status: Impaired/Different from baseline Area of Impairment: Memory;Awareness Memory: Decreased recall of precautions Awareness: Emergent General Comments: Pt states "oh I was hoping I was about ready to go home"- Pt is unable to tolerate sitting or standing but was hoping for d/c home soon.    Extremity/Trunk Assessment Right Upper Extremity Assessment RUE ROM/Strength/Tone: Deficits RUE ROM/Strength/Tone Deficits: MMT 3+ out 5  shoulder flexion WFL for precautions RUE Sensation: Deficits RUE Sensation Deficits: numbness RUE Coordination:  (gross grasp 3 out 5 to hold styfoam cup) Left Upper Extremity Assessment LUE ROM/Strength/Tone: Deficits;Due to precautions LUE ROM/Strength/Tone Deficits: MMT 3 out 5  shoulder flexion WFL for precautions LUE Sensation: Deficits LUE Sensation Deficits: numbness, missing 2nd digit from previous injury LUE Coordination: Deficits LUE Coordination Deficits: 2 out 5 grasp Right Lower Extremity Assessment RLE ROM/Strength/Tone: Deficits RLE ROM/Strength/Tone Deficits: AAROM WFL, strength hip flexion 3-/5, knee extension 3+/5, ankle dorsiflexion at least 3/5 RLE Coordination: Deficits RLE Coordination Deficits: absent to light touch Left Lower Extremity Assessment LLE ROM/Strength/Tone: Deficits LLE  ROM/Strength/Tone Deficits: AAROM WFL, stiff throughout; strength 1-2/5 throughout LLE Coordination: Deficits LLE Coordination Deficits: absent to light touch     Mobility Bed Mobility Bed Mobility: Rolling Left;Left Sidelying to Sit Rolling Left: 1: +2 Total assist;With rail Rolling Left: Patient Percentage: 20% Left Sidelying to Sit: 1: +2 Total assist Left Sidelying to Sit: Patient Percentage: 0% Details for Bed Mobility Assistance: assisted to reach for rail with right UE, assisted feet off bed and lifted heavy trunk wtih CTO in place in supine already Transfers Details for Transfer Assistance: scooting on edge of bed up to head of the bed with assist for leaning anteriorly and cues to push through feet (pt unable today,) to assist with scooting     Exercise General Exercises - Lower Extremity Ankle Circles/Pumps: AAROM;Both;5 reps;Supine Heel Slides: AAROM;Both;5 reps;Supine   Balance Balance Balance Assessed: Yes Static Sitting Balance Static Sitting - Balance Support: Feet supported;Bilateral upper extremity supported Static Sitting - Level of Assistance: 2: Max assist;4: Min assist Static Sitting - Comment/# of Minutes: max assist initially at edge of bed about 5 minute with assist to get UE's in weight bearing position and cues for anterior weight shift, unable to use abdominals to come forward with cues; after scooting up to head of bed and anterior weight shift able to sit with min assist.   End of Session OT - End of Session Activity Tolerance: Patient tolerated treatment well;Patient limited by pain Patient left: in bed;with call bell/phone within reach;with family/visitor present Nurse Communication: Mobility status;Precautions;Need for lift equipment  GO     Lucile Shutters 03/12/2013, 2:05 PM Pager: [phone]

## 2013-03-12 NOTE — Progress Notes (Signed)
Subjective: Patient reports She's doing better improved sensation improved movement less pain  Objective: Vital signs in last 24 hours: Temp:  [98.3 F (36.8 C)-99.3 F (37.4 C)] 99.3 F (37.4 C) (05/08 0400) Pulse Rate:  [62-91] 63 (05/08 0700) Resp:  [10-26] 20 (05/08 0700) BP: (162-202)/(58-104) 172/65 mmHg (05/08 0700) SpO2:  [96 %-99 %] 98 % (05/08 0700)  Intake/Output from previous day: 05/07 0701 - 05/08 0700 In: 2190 [P.O.:240; I.V.:1800; IV Piggyback:150] Out: 1575 [Urine:1575] Intake/Output this shift:    6 strength is stable with 4+ out of 5 right looks every 4 minus out of 5 left lower extremity reduction was also 4+ out of 5 left upper to me is weak in her grade 1-2/5  Lab Results:  Recent Labs  03/12/13 0344  WBC 14.5*  HGB 10.5*  HCT 30.2*  PLT 206   BMET  Recent Labs  03/11/13 0430 03/12/13 0344  NA 136 136  K 3.4* 3.1*  CL 100 98  CO2 27 28  GLUCOSE 131* 122*  BUN 22 24*  CREATININE 0.79 0.77  CALCIUM 7.8* 7.9*    Studies/Results: No results found.  Assessment/Plan: Continue with physical occupational therapy mobilization ad lib. however go slowly with regard to her pain if she feels significant increased pain with mobilization will either bedrest or move more slowly.  LOS: 5 days     Latrenda Irani P 03/12/2013, 7:33 AM

## 2013-03-12 NOTE — Progress Notes (Signed)
Rehab admissions - Evaluated for possible admission.  I met with patient.  I left booklets about inpatient rehab with her.  I then called the grandaughter, [name].  Grandaughter tells me that family will provide 24 hr supervision and care after a potential inpatient rehab stay.  I will need authorization from [facility] prior to an inpatient rehab stay.  Will await PT and OT tolerance and then will seek insurance authorization.  Call me for questions.  #[phone]

## 2013-03-12 NOTE — Progress Notes (Signed)
Patient ID: Cassandra Walker, female   DOB: 07/02/1932, 79 y.o.   MRN: [id number]   LOS: 5 days   Subjective: Pt states she has not had any further nausea since receiving compazine early this morning.  Diet advanced to full liquid.  Tolerating oral medication.  No bm +flatus.  Had urinary retention, foley was re-inserted.  Objective: Vital signs in last 24 hours: Temp:  [98.3 F (36.8 C)-99.3 F (37.4 C)] 98.7 F (37.1 C) (05/08 0825) Pulse Rate:  [62-91] 77 (05/08 0715) Resp:  [15-26] 21 (05/08 0715) BP: (162-197)/(58-104) 172/65 mmHg (05/08 0700) SpO2:  [96 %-99 %] 97 % (05/08 0715) Last BM Date: 03/06/13  I/O last 3 completed shifts: In: 2240 [P.O.:240; I.V.:1800; IV Piggyback:200] Out: 2475 [Urine:2475]   Antibiotics Given (last 72 hours)   Date/Time Action Medication Dose Rate   03/09/13 1550 Given   ceFAZolin (ANCEF) IVPB 1 g/50 mL premix 1 g 100 mL/hr   03/10/13 0026 Given   ceFAZolin (ANCEF) IVPB 1 g/50 mL premix 1 g 100 mL/hr   03/10/13 0752 Given   ceFAZolin (ANCEF) IVPB 1 g/50 mL premix 1 g 100 mL/hr   03/10/13 1542 Given   ceFAZolin (ANCEF) IVPB 1 g/50 mL premix 1 g 100 mL/hr   03/11/13 0003 Given   ceFAZolin (ANCEF) IVPB 1 g/50 mL premix 1 g 100 mL/hr   03/11/13 4098 Given   ceFAZolin (ANCEF) IVPB 1 g/50 mL premix 1 g 100 mL/hr   03/11/13 1521 Given   ceFAZolin (ANCEF) IVPB 1 g/50 mL premix 1 g 100 mL/hr   03/12/13 0038 Given   ceFAZolin (ANCEF) IVPB 1 g/50 mL premix 1 g 100 mL/hr   03/12/13 1191 Given   ceFAZolin (ANCEF) IVPB 1 g/50 mL premix 1 g 100 mL/hr      Lab Results:  CBC  Recent Labs  03/12/13 0344  WBC 14.5*  HGB 10.5*  HCT 30.2*  PLT 206   BMET  Recent Labs  03/11/13 0430 03/12/13 0344  NA 136 136  K 3.4* 3.1*  CL 100 98  CO2 27 28  GLUCOSE 131* 122*  BUN 22 24*  CREATININE 0.79 0.77  CALCIUM 7.8* 7.9*     Physical exam:  General appearance: alert and cooperative  Neck: cto  Resp: clear to auscultation bilaterally   Cardio: S1S2 regular rate and rhythm, no murmurs, gallops or rubs. No edema. +2 bilateral pedal pulses.  GI: soft, active BS, NT. No masses, hernias or organomegaly.  Neuro: L grip 0/5,R 4/5, BLE push and pulls are strong and equal   Patient Active Problem List   Diagnosis Date Noted  . Fall 03/10/2013  . Fracture of C5 vertebra, closed 03/10/2013  . C6 cervical fracture 03/10/2013  . Incomplete quadriplegia at C5-6 level 03/10/2013  . HTN (hypertension) 03/10/2013  . Hyperglycemia 03/10/2013  . Pulmonary nodule 07/24/2011    Assessment/Plan:  s/p Procedure(s):  Cervical four -seven POSTERIOR CERVICAL FUSION  Fall from lawnmower  C5,6 fx with cord injury - S/P post fusion, collar, brace and mobilization per Dr. Wynetta Emery.  According to nursing pt to remain in ICU for supervision per NUS until pt mobilizes. FEN -advance to full liquid.  IVF until intake is adequate. Nausea: etiology unclear.  +BS, abdomen is soft and non-tender so ileus or obstruction is unlikely.  Start miralax once daily.  PRN compazine. VTE - PAS, lovenox POD 5 per Dr. Wynetta Emery. PT/OT ordered, pt did not do much yesterday due to pain.  Hyperglycemia -  SSI  HTN - resume atenolol and stop iv metoprolol. Hypothyroidism: resume oral home medication Hypokalemia: supplement x2, check Mg and repeat BMP in AM Transfer to 4N when OK with NS DIspo: CIR evaluated 5/7 Urinary retention: continue with foley, then voiding trial.  Start urecholine.  Ashok Norris, ANP-BC Pager: [phone] General Trauma PA Pager: [phone]   03/12/2013
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
## 2013-03-12 NOTE — Progress Notes (Signed)
Patient examined and I agree with the assessment and plan Add urecholine for urinary retention Violeta Gelinas, MD, MPH, FACS Pager: [phone]  03/12/2013 10:47 AM

## 2013-03-12 NOTE — Progress Notes (Signed)
Rehab Admissions Coordinator Note:  Patient was screened by Clois Dupes for appropriateness for an Inpatient Acute Rehab Consult. Dr. Wynn Banker assessed pt 5/7 and Roderic Palau, RN will be following this pt.  [phone]  Clois Dupes 03/12/2013, 2:05 PM  I can be reached at [phone].

## 2013-03-13 DIAGNOSIS — Z7401 Bed confinement status: Secondary | ICD-10-CM

## 2013-03-13 DIAGNOSIS — R339 Retention of urine, unspecified: Secondary | ICD-10-CM

## 2013-03-13 LAB — GLUCOSE, CAPILLARY
Glucose-Capillary: 98 mg/dL (ref 70–99)
Glucose-Capillary: 105 mg/dL — ABNORMAL HIGH (ref 70–99)

## 2013-03-13 LAB — BASIC METABOLIC PANEL
BUN: 23 mg/dL (ref 6–23)
CO2: 30 mEq/L (ref 19–32)
Glucose, Bld: 106 mg/dL — ABNORMAL HIGH (ref 70–99)
Potassium: 4.2 mEq/L (ref 3.5–5.1)
Sodium: 136 mEq/L (ref 135–145)

## 2013-03-13 MED ORDER — DOCUSATE SODIUM 100 MG PO CAPS
100.0000 mg | ORAL_CAPSULE | Freq: Two times a day (BID) | ORAL | Status: DC
  Administered 2013-03-13 – 2013-03-17 (×9): 100 mg via ORAL
  Filled 2013-03-13 (×9): qty 1

## 2013-03-13 MED ORDER — BISACODYL 10 MG RE SUPP
10.0000 mg | Freq: Every day | RECTAL | Status: DC | PRN
  Filled 2013-03-13: qty 1

## 2013-03-13 NOTE — Clinical Social Work Note (Signed)
Clinical Social Worker continuing to follow patient for support and discharge planning needs.  CSW met with patient at bedside who is hopeful for inpatient rehab placement at discharge.  Patient states that her granddaughter is helping her make her discharge arrangements.  CSW inquired about current substance use.  Patient states that she has been sober for about 50 years after several years of alcoholism in her 89's.  Patient is very proud of her accomplishment and very willing to share her story.  SBIRT complete.  No resources needed at this time.  CSW remains available for support as needed.  Macario Golds, [place] [phone]

## 2013-03-13 NOTE — Progress Notes (Signed)
VASCULAR LAB PRELIMINARY  PRELIMINARY  PRELIMINARY  PRELIMINARY  Bilateral lower extremity venous duplex  completed.    Preliminary report:  Bilateral:  No evidence of DVT, superficial thrombosis, or Baker's Cyst.    Brittnay Pigman, RVT 03/13/2013, 3:40 PM

## 2013-03-13 NOTE — Progress Notes (Signed)
I wrote a separate note. Transferred to floor This patient has been seen and I agree with the findings and treatment plan.  Marta Lamas. Gae Bon, MD, FACS [phone] (pager) [phone] (direct pager) Trauma Surgeon
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
## 2013-03-13 NOTE — Progress Notes (Signed)
Trauma Service Note  Subjective: Patient is talkative and pleasant.  Still very weak.  Still has foley  Objective: Vital signs in last 24 hours: Temp:  [97.9 F (36.6 C)-98.7 F (37.1 C)] 98 F (36.7 C) (05/09 0351) Pulse Rate:  [52-73] 53 (05/09 0700) Resp:  [15-23] 15 (05/09 0700) BP: (122-188)/(40-82) 157/54 mmHg (05/09 0700) SpO2:  [92 %-100 %] 100 % (05/09 0700) Last BM Date: 03/06/13  Intake/Output from previous day: 05/08 0701 - 05/09 0700 In: 1520 [P.O.:320; I.V.:1200] Out: 1045 [Urine:1045] Intake/Output this shift:    General: No acute distress  Lungs: Clear  Abd: Benign  Extremities: No DVT signs or symptoms.  Will get surveillance DVT scans  Neuro: Intact, still significantly more weak on the left than the right.  Can squeeze on the right, and bend the knee on the right  Lab Results: CBC   Recent Labs  03/12/13 0344  WBC 14.5*  HGB 10.5*  HCT 30.2*  PLT 206   BMET  Recent Labs  03/12/13 0344 03/13/13 0340  NA 136 136  K 3.1* 4.2  CL 98 99  CO2 28 30  GLUCOSE 122* 106*  BUN 24* 23  CREATININE 0.77 0.77  CALCIUM 7.9* 7.6*   PT/INR No results found for this basename: LABPROT, INR,  in the last 72 hours ABG No results found for this basename: PHART, PCO2, PO2, HCO3,  in the last 72 hours  Studies/Results: No results found.  Anti-infectives: Anti-infectives   Start     Dose/Rate Route Frequency Ordered Stop   03/07/13 2359  ceFAZolin (ANCEF) IVPB 1 g/50 mL premix  Status:  Discontinued     1 g 100 mL/hr over 30 Minutes Intravenous Every 8 hours 03/07/13 2245 03/12/13 0929   03/07/13 1736  bacitracin 50,000 Units in sodium chloride irrigation 0.9 % 500 mL irrigation  Status:  Discontinued       As needed 03/07/13 1809 03/07/13 2026   03/07/13 1658  bacitracin 16109 UNITS injection    Comments:  AYDELETTE, JAMIE: cabinet override      03/07/13 1658 03/08/13 0514   03/07/13 1642  ceFAZolin (ANCEF) 2-3 GM-% IVPB SOLR    Comments:   FRIEDMAN, SCOTT: cabinet override      03/07/13 1642 03/07/13 1728      Assessment/Plan: s/p Procedure(s): Cervical four -seven POSTERIOR CERVICAL FUSION Advance diet Continue foley due to acute urinary retention and urinary output monitoring Duplex venous study to R/O DVT  LOS: 6 days   Marta Lamas. Gae Bon, MD, FACS [phone] Trauma Surgeon 03/13/2013
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
## 2013-03-14 LAB — GLUCOSE, CAPILLARY
Glucose-Capillary: 97 mg/dL (ref 70–99)
Glucose-Capillary: 120 mg/dL — ABNORMAL HIGH (ref 70–99)

## 2013-03-14 NOTE — Progress Notes (Signed)
7 Days Post-Op  Subjective: Mild pain No other complaints  Objective: Vital signs in last 24 hours: Temp:  [98 F (36.7 C)-98.4 F (36.9 C)] 98.4 F (36.9 C) (05/10 0209) Pulse Rate:  [55-61] 60 (05/10 0209) Resp:  [14-20] 18 (05/10 0552) BP: (116-167)/(50-76) 155/61 mmHg (05/10 0552) SpO2:  [97 %-100 %] 100 % (05/10 0552) Last BM Date: 03/06/13  Intake/Output from previous day: 05/09 0701 - 05/10 0700 In: 745 [P.O.:120; I.V.:625] Out: 352 [Urine:350; Stool:2] Intake/Output this shift: Total I/O In: 120 [P.O.:120] Out: -   Abdomen soft, NT Neuro unchanged  Lab Results:   Recent Labs  03/12/13 0344  WBC 14.5*  HGB 10.5*  HCT 30.2*  PLT 206   BMET  Recent Labs  03/12/13 0344 03/13/13 0340  NA 136 136  K 3.1* 4.2  CL 98 99  CO2 28 30  GLUCOSE 122* 106*  BUN 24* 23  CREATININE 0.77 0.77  CALCIUM 7.9* 7.6*   PT/INR No results found for this basename: LABPROT, INR,  in the last 72 hours ABG No results found for this basename: PHART, PCO2, PO2, HCO3,  in the last 72 hours  Studies/Results: No results found.  Anti-infectives: Anti-infectives   Start     Dose/Rate Route Frequency Ordered Stop   03/07/13 2359  ceFAZolin (ANCEF) IVPB 1 g/50 mL premix  Status:  Discontinued     1 g 100 mL/hr over 30 Minutes Intravenous Every 8 hours 03/07/13 2245 03/12/13 0929   03/07/13 1736  bacitracin 50,000 Units in sodium chloride irrigation 0.9 % 500 mL irrigation  Status:  Discontinued       As needed 03/07/13 1809 03/07/13 2026   03/07/13 1658  bacitracin 86578 UNITS injection    Comments:  AYDELETTE, JAMIE: cabinet override      03/07/13 1658 03/08/13 0514   03/07/13 1642  ceFAZolin (ANCEF) 2-3 GM-% IVPB SOLR    Comments:  FRIEDMAN, SCOTT: cabinet override      03/07/13 1642 03/07/13 1728      Assessment/Plan: s/p Procedure(s): Cervical four -seven POSTERIOR CERVICAL FUSION (N/A)  Continue foley for weakness Rehab soon  LOS: 7 days     Cassandra Walker A 03/14/2013

## 2013-03-14 NOTE — Progress Notes (Signed)
C/o  Pain -   Temp:  [98 F (36.7 C)-98.4 F (36.9 C)] 98.4 F (36.9 C) (05/10 0209) Pulse Rate:  [55-61] 60 (05/10 0209) Resp:  [14-20] 18 (05/10 0552) BP: (116-167)/(50-76) 155/61 mmHg (05/10 0552) SpO2:  [97 %-100 %] 100 % (05/10 0552) Moves all 4   Plan: Pt working with tx's  - await rehab - Dr Wynetta Emery will F/U monday

## 2013-03-15 LAB — GLUCOSE, CAPILLARY
Glucose-Capillary: 93 mg/dL (ref 70–99)
Glucose-Capillary: 109 mg/dL — ABNORMAL HIGH (ref 70–99)
Glucose-Capillary: 116 mg/dL — ABNORMAL HIGH (ref 70–99)

## 2013-03-15 MED ORDER — POLYETHYLENE GLYCOL 3350 17 G PO PACK: 17.0000 g | PACK | Freq: Every day | ORAL | Status: DC

## 2013-03-15 NOTE — Progress Notes (Signed)
8 Days Post-Op  Subjective: Alert and stable. Tol. Diet, no BM 2 days.  Objective: Vital signs in last 24 hours: Temp:  [98.1 F (36.7 C)-98.4 F (36.9 C)] 98.1 F (36.7 C) (05/11 0508) Pulse Rate:  [57-62] 57 (05/11 0508) Resp:  [18] 18 (05/11 0508) BP: (129-158)/(57-60) 149/58 mmHg (05/11 0508) SpO2:  [91 %-94 %] 93 % (05/11 0508) Last BM Date: 03/13/13  Intake/Output from previous day: 05/10 0701 - 05/11 0700 In: 720 [P.O.:720] Out: 1450 [Urine:1450] Intake/Output this shift:    General:  Alert. No distress. Cooperative Lungs: clear bilat. GI: abd soft, non tender Neuro:  Moves hands and feet  Lab Results:  Results for orders placed during the hospital encounter of 03/07/13 (from the past 24 hour(s))  GLUCOSE, CAPILLARY     Status: Abnormal   Collection Time    03/14/13 11:37 AM      Result Value Range   Glucose-Capillary 118 (*) 70 - 99 mg/dL  GLUCOSE, CAPILLARY     Status: Abnormal   Collection Time    03/14/13  4:20 PM      Result Value Range   Glucose-Capillary 120 (*) 70 - 99 mg/dL  GLUCOSE, CAPILLARY     Status: Abnormal   Collection Time    03/14/13  9:35 PM      Result Value Range   Glucose-Capillary 100 (*) 70 - 99 mg/dL   Comment 1 Notify RN     Comment 2 Documented in Chart    GLUCOSE, CAPILLARY     Status: None   Collection Time    03/15/13  6:50 AM      Result Value Range   Glucose-Capillary 93  70 - 99 mg/dL   Comment 1 Documented in Chart     Comment 2 Notify RN       Studies/Results: @RISRSLT24 @  . atenolol  100 mg Oral Daily  . bethanechol  25 mg Oral QID  . cloNIDine  0.1 mg Oral Daily  . docusate sodium  100 mg Oral BID  . feeding supplement  1 Container Oral TID BM  . hydrochlorothiazide  25 mg Oral Daily  . levothyroxine  75 mcg Oral QAC breakfast  . lisinopril  40 mg Oral Daily  . pantoprazole  40 mg Oral Daily  . polyethylene glycol  17 g Oral BID  . polyethylene glycol  17 g Oral Daily     Assessment/Plan: s/p  Procedure(s): Cervical four -seven POSTERIOR CERVICAL FUSION   Central cord syndrome. POD#8 post. Cervical fusion.  Plan  Rehab soon Constipation. Rx daily miralax. Continue foley for neurologic weakness.  @PROBHOSP @  LOS: 8 days    Cassandra Walker M. Derrell Lolling, M.D., [facility] General and Minimally invasive Surgery Breast and Colorectal Surgery Office:   [phone] Pager:   [phone]  03/15/2013  . .prob

## 2013-03-16 ENCOUNTER — Inpatient Hospital Stay (HOSPITAL_COMMUNITY): Payer: Medicare Other

## 2013-03-16 LAB — GLUCOSE, CAPILLARY

## 2013-03-16 MED ORDER — OXYCODONE HCL 5 MG PO TABS
5.0000 mg | ORAL_TABLET | ORAL | Status: DC | PRN
  Administered 2013-03-16: 10 mg via ORAL
  Filled 2013-03-16 (×2): qty 2

## 2013-03-16 MED ORDER — MORPHINE SULFATE 2 MG/ML IJ SOLN
2.0000 mg | INTRAMUSCULAR | Status: DC | PRN
  Filled 2013-03-16: qty 1

## 2013-03-16 MED ORDER — TRAMADOL HCL 50 MG PO TABS
100.0000 mg | ORAL_TABLET | Freq: Four times a day (QID) | ORAL | Status: DC
  Administered 2013-03-16 – 2013-03-17 (×6): 100 mg via ORAL
  Filled 2013-03-16 (×8): qty 2

## 2013-03-16 NOTE — Progress Notes (Signed)
Rehab admissions - I spoke with patient this morning.  She has not been out of bed in chair yet.  We will need to establish her tolerance for therapies before I can get authorization from [facility] for acute inpatient rehab admission.  I did speak with PT today and share my concerns.  Will see how she does with therapies today.  Call me for questions.  #[phone]

## 2013-03-16 NOTE — Progress Notes (Signed)
Rehab admissions - Did well with therapies today.  Able to sit up in chair 2 hrs per patient's nurse.  I will send all clinicals to [facility] and request admission to inpatient rehab.  Will await decision from [facility] about rehab.  Call me for questions.  #[phone]
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
## 2013-03-16 NOTE — Progress Notes (Signed)
Patient apparently not doing enough with PT/OT yet to demonstrate tolerance for Rehab.n  We have tried removing Foley before.  Patient has no sensation of urgency (at least she did not before).  Will reassess.  This patient has been seen and I agree with the findings and treatment plan.  Marta Lamas. Gae Bon, MD, FACS [phone] (pager) [phone] (direct pager) Trauma Surgeon

## 2013-03-16 NOTE — Progress Notes (Signed)
Occupational Therapy Treatment Patient Details Name: Cassandra Walker MRN: [id number] DOB: June 12, 1932 Today's Date: 03/16/2013 Time: 4098-1191 OT Time Calculation (min): 32 min  OT Assessment / Plan / Recommendation Comments on Treatment Session Pt progressed to OOB for meal and self feeding with Rt UE today. Pt able to hold spoon in right hand without (A(). Pt demonstrates gross grasp in Lt UE holding bread. Pt continued to eat once in sitting and consumed double the amount supine. Pt would greatly benefit from OOB for all meals daily     Follow Up Recommendations  CIR    Barriers to Discharge       Equipment Recommendations  Wheelchair (measurements OT);Wheelchair cushion (measurements OT)    Recommendations for Other Services Rehab consult  Frequency Min 3X/week   Plan Discharge plan remains appropriate    Precautions / Restrictions Precautions Precautions: Cervical Required Braces or Orthoses: Other Brace/Splint Other Brace/Splint: cervical with thoracic extension   Pertinent Vitals/Pain Pain in Rt UE with static sitting at glenohumeral joint    ADL  Eating/Feeding: Set up Where Assessed - Eating/Feeding: Chair (eating lunch self feeding Rt UE) Grooming: Wash/dry face;Set up Where Assessed - Grooming: Supported sitting (wiping mouth and blowing nose) Toilet Transfer: Simulated;+2 Total assistance Toilet Transfer: Patient Percentage: 40% Toilet Transfer Method: Stand pivot Acupuncturist: Raised toilet seat with arms (or 3-in-1 over toilet) Equipment Used: Gait belt Transfers/Ambulation Related to ADLs: Pt stand pivot to the right side with pad used at hips to help facilitation hip extension. Pt with total (A) to turn LT LE toward chair ADL Comments: Pt supine on arrival attempting to eat with sister assistance. Pt agreeable to OOB for meal. pt states "its okay I am about full anyway after x3 bites of mash potatoes and several bites of bread roll. pt completed bed  mobility log roll left side and pushing with Rt UE into sitting. pt with LOB in all direction stat sitting. pt needed constant tactile input for sitting balance    OT Diagnosis:    OT Problem List:   OT Treatment Interventions:     OT Goals Acute Rehab OT Goals OT Goal Formulation: With patient Time For Goal Achievement: 03/26/13 Potential to Achieve Goals: Good ADL Goals Pt Will Perform Grooming: with set-up;Sitting, chair;with adaptive equipment ADL Goal: Grooming - Progress: Progressing toward goals Pt Will Perform Upper Body Bathing: with set-up;Sit to stand in shower;Sitting, chair;with adaptive equipment ADL Goal: Upper Body Bathing - Progress: Progressing toward goals Pt Will Perform Upper Body Dressing: with min assist;Sitting, chair;Supported Engineer, water to Toilet: with 2+ total assist;Stand pivot transfer;3-in-1 (50%) ADL Goal: Statistician - Progress: Progressing toward goals Pt Will Perform Toileting - Clothing Manipulation: with max assist;Sitting on 3-in-1 or toilet Pt Will Perform Toileting - Hygiene: with max assist;Sitting on 3-in-1 or toilet Miscellaneous OT Goals Miscellaneous OT Goal #1: Pt will complete bed mobility Mod (A) as precursor to adls OT Goal: Miscellaneous Goal #1 - Progress: Progressing toward goals  Visit Information  Last OT Received On: 03/16/13 Assistance Needed: +2 PT/OT Co-Evaluation/Treatment: Yes    Subjective Data      Prior Functioning       Cognition  Cognition Arousal/Alertness: Awake/alert Behavior During Therapy: WFL for tasks assessed/performed Awareness: Emergent;Anticipatory    Mobility  Bed Mobility Bed Mobility: Supine to Sit;Sitting - Scoot to Edge of Bed Rolling Left: 3: Mod assist;With rail Left Sidelying to Sit: 2: Max assist Supine to Sit: 2: Max assist;HOB elevated Sitting -  Scoot to [place] of Bed: 2: Max assist Details for Bed Mobility Assistance: Pt pushing with Rt UE to help with mobility and needing  cues for hand placement to attempt to use BIL UE for supporting trunk static sitting Transfers Transfers: Sit to Stand;Stand to Sit Sit to Stand: 1: +2 Total assist;From elevated surface;From bed Sit to Stand: Patient Percentage: 30% Stand to Sit: 1: +2 Total assist;To elevated surface;To chair/3-in-1 Transfer via Lift Equipment: Maximove (Maximove pad placed behind pt for chair to bed) Details for Transfer Assistance: Rt Le demonstrates incr strength so pivot was completed to the right side. Pad was used at hips for hip extension and assistane to turn patient to the chair    Exercises  General Exercises - Lower Extremity Long Arc Quad: AROM;Right;AAROM;Left;5 reps;Seated;Other (comment) (resisted flexion to place foot back on floor) Other Exercises Other Exercises: pt sitting eob and able to kick Rt LE knee extension x 10 reps and lt LE with AAROM x 10 reps knee extension. Pt able to AROM knee flexion x10 reps rt le and AAROM Lt LE x10 reps with (max (A)   Balance Balance Balance Assessed: Yes Static Sitting Balance Static Sitting - Balance Support: Feet supported;No upper extremity supported Static Sitting - Level of Assistance: 3: Mod assist Static Sitting - Comment/# of Minutes: Pt with LOB in all directions however pt with strong lt lean partially due to hump in bed under right hip. Pt extending posteriorly with any LB mobility (knee extension) Pt needed max tactile input to attempt initiation of core muscles with initiation and no sustained activation at this time   End of Session OT - End of Session Activity Tolerance: Patient tolerated treatment well Patient left: in chair;with call bell/phone within reach;with family/visitor present Nurse Communication: Mobility status;Precautions  GO     Lucile Shutters 03/16/2013, 2:58 PM Pager: [phone]

## 2013-03-16 NOTE — Clinical Social Work Note (Signed)
Clinical Social Worker continuing to follow patient and family for support and discharge planning needs.  PT/OT continuing to recommend inpatient rehab - inpatient rehab has submitted for authorization and plan to admit patient once authorization is received.  Patient with family support at bedside and has no further social work concerns.  Clinical Social Worker will sign off for now as social work intervention is no longer needed. Please consult [place] again if new need arises.  Macario Golds, [place] [phone]

## 2013-03-16 NOTE — Progress Notes (Signed)
Subjective: Patient reports Overall she's feeling better neck pain is getting better she feels like her arms and legs her overall improving she says she waxes and wanes with the movement of her left leg.  Objective: Vital signs in last 24 hours: Temp:  [97.4 F (36.3 C)-98.2 F (36.8 C)] 98 F (36.7 C) (05/12 1400) Pulse Rate:  [60-65] 60 (05/12 1400) Resp:  [17-18] 17 (05/12 1400) BP: (110-154)/(52-65) 110/59 mmHg (05/12 1400) SpO2:  [96 %-98 %] 98 % (05/12 1400)  Intake/Output from previous day: 05/11 0701 - 05/12 0700 In: -  Out: 1300 [Urine:1300] Intake/Output this shift:    Upper extremities strength is stable for placenta 5 right 404 left except very weak handgrip at 1-2/5. Lower extremity strength proximally she is about 3/5 in her iliopsoas quads about 4 minus out of 5 again she says she waxes and wanes sometimes she moves better than others.  Lab Results: No results found for this basename: WBC, HGB, HCT, PLT,  in the last 72 hours BMET No results found for this basename: NA, K, CL, CO2, GLUCOSE, BUN, CREATININE, CALCIUM,  in the last 72 hours  Studies/Results: No results found.  Assessment/Plan: Will followup x-ray and continue to work on physical occupational therapy and rehabilitation placement   LOS: 9 days     Cassandra Walker P 03/16/2013, 4:34 PM
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
## 2013-03-17 ENCOUNTER — Inpatient Hospital Stay (HOSPITAL_COMMUNITY)
Admission: RE | Admit: 2013-03-17 | Discharge: 2013-04-09 | DRG: 945 | Disposition: A | Discharge status: Skilled Nursing Facility | Payer: Medicare Other | Source: Intra-hospital | Attending: Physical Medicine & Rehabilitation | Admitting: Physical Medicine & Rehabilitation

## 2013-03-17 DIAGNOSIS — Z87891 Personal history of nicotine dependence: Secondary | ICD-10-CM

## 2013-03-17 DIAGNOSIS — M720 Palmar fascial fibromatosis [Dupuytren]: Secondary | ICD-10-CM | POA: Diagnosis present

## 2013-03-17 DIAGNOSIS — K219 Gastro-esophageal reflux disease without esophagitis: Secondary | ICD-10-CM | POA: Diagnosis present

## 2013-03-17 DIAGNOSIS — E785 Hyperlipidemia, unspecified: Secondary | ICD-10-CM | POA: Diagnosis present

## 2013-03-17 DIAGNOSIS — Z981 Arthrodesis status: Secondary | ICD-10-CM

## 2013-03-17 DIAGNOSIS — N179 Acute kidney failure, unspecified: Secondary | ICD-10-CM | POA: Diagnosis not present

## 2013-03-17 DIAGNOSIS — G8250 Quadriplegia, unspecified: Secondary | ICD-10-CM | POA: Diagnosis present

## 2013-03-17 DIAGNOSIS — S14155A Other incomplete lesion at C5 level of cervical spinal cord, initial encounter: Secondary | ICD-10-CM | POA: Diagnosis present

## 2013-03-17 DIAGNOSIS — H832X9 Labyrinthine dysfunction, unspecified ear: Secondary | ICD-10-CM | POA: Clinically undetermined

## 2013-03-17 DIAGNOSIS — S12600S Unspecified displaced fracture of seventh cervical vertebra, sequela: Secondary | ICD-10-CM

## 2013-03-17 DIAGNOSIS — R339 Retention of urine, unspecified: Secondary | ICD-10-CM | POA: Diagnosis present

## 2013-03-17 DIAGNOSIS — N390 Urinary tract infection, site not specified: Secondary | ICD-10-CM | POA: Diagnosis not present

## 2013-03-17 DIAGNOSIS — M1990 Unspecified osteoarthritis, unspecified site: Secondary | ICD-10-CM | POA: Diagnosis present

## 2013-03-17 DIAGNOSIS — R599 Enlarged lymph nodes, unspecified: Secondary | ICD-10-CM | POA: Diagnosis present

## 2013-03-17 DIAGNOSIS — G609 Hereditary and idiopathic neuropathy, unspecified: Secondary | ICD-10-CM | POA: Diagnosis present

## 2013-03-17 DIAGNOSIS — N319 Neuromuscular dysfunction of bladder, unspecified: Secondary | ICD-10-CM | POA: Diagnosis present

## 2013-03-17 DIAGNOSIS — I10 Essential (primary) hypertension: Secondary | ICD-10-CM | POA: Diagnosis present

## 2013-03-17 DIAGNOSIS — Z5189 Encounter for other specified aftercare: Secondary | ICD-10-CM | POA: Diagnosis present

## 2013-03-17 DIAGNOSIS — IMO0002 Reserved for concepts with insufficient information to code with codable children: Secondary | ICD-10-CM

## 2013-03-17 DIAGNOSIS — M899 Disorder of bone, unspecified: Secondary | ICD-10-CM | POA: Diagnosis present

## 2013-03-17 DIAGNOSIS — K592 Neurogenic bowel, not elsewhere classified: Secondary | ICD-10-CM | POA: Diagnosis present

## 2013-03-17 DIAGNOSIS — W19XXXA Unspecified fall, initial encounter: Secondary | ICD-10-CM

## 2013-03-17 DIAGNOSIS — G8254 Quadriplegia, C5-C7 incomplete: Secondary | ICD-10-CM | POA: Diagnosis present

## 2013-03-17 DIAGNOSIS — H8190 Unspecified disorder of vestibular function, unspecified ear: Secondary | ICD-10-CM | POA: Clinically undetermined

## 2013-03-17 DIAGNOSIS — B9689 Other specified bacterial agents as the cause of diseases classified elsewhere: Secondary | ICD-10-CM | POA: Diagnosis not present

## 2013-03-17 DIAGNOSIS — M949 Disorder of cartilage, unspecified: Secondary | ICD-10-CM | POA: Diagnosis present

## 2013-03-17 DIAGNOSIS — D72829 Elevated white blood cell count, unspecified: Secondary | ICD-10-CM | POA: Diagnosis present

## 2013-03-17 LAB — GLUCOSE, CAPILLARY: Glucose-Capillary: 117 mg/dL — ABNORMAL HIGH (ref 70–99)

## 2013-03-17 MED ORDER — BETHANECHOL CHLORIDE 25 MG PO TABS
25.0000 mg | ORAL_TABLET | Freq: Three times a day (TID) | ORAL | Status: DC
  Administered 2013-03-17 (×2): 25 mg via ORAL
  Filled 2013-03-17 (×3): qty 1

## 2013-03-17 MED ORDER — ENOXAPARIN SODIUM 40 MG/0.4ML ~~LOC~~ SOLN
40.0000 mg | SUBCUTANEOUS | Status: DC
  Administered 2013-03-18 – 2013-04-08 (×22): 40 mg via SUBCUTANEOUS
  Filled 2013-03-17 (×25): qty 0.4

## 2013-03-17 MED ORDER — PROCHLORPERAZINE MALEATE 5 MG PO TABS
5.0000 mg | ORAL_TABLET | Freq: Four times a day (QID) | ORAL | Status: DC | PRN
  Administered 2013-04-02 – 2013-04-06 (×2): 5 mg via ORAL
  Filled 2013-03-17 (×2): qty 2

## 2013-03-17 MED ORDER — POLYETHYLENE GLYCOL 3350 17 G PO PACK
17.0000 g | PACK | Freq: Two times a day (BID) | ORAL | Status: DC
  Administered 2013-03-17 – 2013-03-23 (×12): 17 g via ORAL
  Filled 2013-03-17 (×17): qty 1

## 2013-03-17 MED ORDER — ALPRAZOLAM 0.5 MG PO TABS: 0.5000 mg | ORAL_TABLET | Freq: Every evening | ORAL | Status: DC | PRN

## 2013-03-17 MED ORDER — ATENOLOL 100 MG PO TABS
100.0000 mg | ORAL_TABLET | Freq: Every day | ORAL | Status: DC
  Administered 2013-03-18 – 2013-03-20 (×3): 100 mg via ORAL
  Filled 2013-03-17 (×5): qty 1

## 2013-03-17 MED ORDER — ENOXAPARIN SODIUM 40 MG/0.4ML ~~LOC~~ SOLN
40.0000 mg | SUBCUTANEOUS | Status: DC
  Administered 2013-03-17: 40 mg via SUBCUTANEOUS
  Filled 2013-03-17 (×2): qty 0.4

## 2013-03-17 MED ORDER — BISACODYL 10 MG RE SUPP
10.0000 mg | Freq: Once | RECTAL | Status: AC
  Administered 2013-03-17: 10 mg via RECTAL

## 2013-03-17 MED ORDER — BISACODYL 10 MG RE SUPP: 10.0000 mg | Freq: Every day | RECTAL | Status: DC | PRN

## 2013-03-17 MED ORDER — GABAPENTIN 100 MG PO CAPS
100.0000 mg | ORAL_CAPSULE | Freq: Every day | ORAL | Status: DC
  Administered 2013-03-17 – 2013-03-18 (×2): 100 mg via ORAL
  Filled 2013-03-17 (×3): qty 1

## 2013-03-17 MED ORDER — PROCHLORPERAZINE 25 MG RE SUPP
12.5000 mg | Freq: Four times a day (QID) | RECTAL | Status: DC | PRN
  Filled 2013-03-17: qty 1

## 2013-03-17 MED ORDER — HYDROCHLOROTHIAZIDE 25 MG PO TABS
25.0000 mg | ORAL_TABLET | Freq: Every day | ORAL | Status: DC
  Administered 2013-03-18 – 2013-03-31 (×13): 25 mg via ORAL
  Filled 2013-03-17 (×16): qty 1

## 2013-03-17 MED ORDER — MELOXICAM 7.5 MG PO TABS
7.5000 mg | ORAL_TABLET | Freq: Every day | ORAL | Status: DC
  Administered 2013-03-18 – 2013-03-24 (×7): 7.5 mg via ORAL
  Filled 2013-03-17 (×10): qty 1

## 2013-03-17 MED ORDER — TRAMADOL HCL 50 MG PO TABS
50.0000 mg | ORAL_TABLET | Freq: Four times a day (QID) | ORAL | Status: DC
  Administered 2013-03-18 – 2013-04-09 (×83): 50 mg via ORAL
  Filled 2013-03-17 (×85): qty 1

## 2013-03-17 MED ORDER — FLEET ENEMA 7-19 GM/118ML RE ENEM: 1.0000 | ENEMA | Freq: Once | RECTAL | Status: AC | PRN

## 2013-03-17 MED ORDER — ACETAMINOPHEN 325 MG PO TABS: 325.0000 mg | ORAL_TABLET | ORAL | Status: DC | PRN

## 2013-03-17 MED ORDER — OXYCODONE HCL 5 MG PO TABS
10.0000 mg | ORAL_TABLET | ORAL | Status: DC | PRN
  Administered 2013-03-18 – 2013-03-24 (×3): 10 mg via ORAL
  Filled 2013-03-17 (×4): qty 2

## 2013-03-17 MED ORDER — LISINOPRIL 40 MG PO TABS
40.0000 mg | ORAL_TABLET | Freq: Every day | ORAL | Status: DC
Start: 2013-03-18 — End: 2013-04-09
  Administered 2013-03-18 – 2013-04-07 (×20): 40 mg via ORAL
  Filled 2013-03-17 (×26): qty 1

## 2013-03-17 MED ORDER — IPRATROPIUM-ALBUTEROL 20-100 MCG/ACT IN AERS: 2.0000 | INHALATION_SPRAY | Freq: Four times a day (QID) | RESPIRATORY_TRACT | Status: DC | PRN

## 2013-03-17 MED ORDER — METHOCARBAMOL 500 MG PO TABS
500.0000 mg | ORAL_TABLET | Freq: Four times a day (QID) | ORAL | Status: DC | PRN
  Administered 2013-03-20 – 2013-03-27 (×3): 500 mg via ORAL
  Filled 2013-03-17 (×3): qty 1

## 2013-03-17 MED ORDER — HYDROCODONE-ACETAMINOPHEN 5-325 MG PO TABS
0.5000 | ORAL_TABLET | ORAL | Status: DC | PRN
  Administered 2013-03-17: 2 via ORAL
  Filled 2013-03-17: qty 2

## 2013-03-17 MED ORDER — LEVOTHYROXINE SODIUM 75 MCG PO TABS
75.0000 ug | ORAL_TABLET | Freq: Every day | ORAL | Status: DC
  Administered 2013-03-18 – 2013-04-09 (×23): 75 ug via ORAL
  Filled 2013-03-17 (×24): qty 1

## 2013-03-17 MED ORDER — GUAIFENESIN-DM 100-10 MG/5ML PO SYRP
5.0000 mL | ORAL_SOLUTION | Freq: Four times a day (QID) | ORAL | Status: DC | PRN
  Administered 2013-03-20 – 2013-03-22 (×3): 10 mL via ORAL
  Filled 2013-03-17 (×3): qty 10

## 2013-03-17 MED ORDER — PROCHLORPERAZINE EDISYLATE 5 MG/ML IJ SOLN
5.0000 mg | Freq: Four times a day (QID) | INTRAMUSCULAR | Status: DC | PRN
  Filled 2013-03-17: qty 2

## 2013-03-17 MED ORDER — ALUM & MAG HYDROXIDE-SIMETH 200-200-20 MG/5ML PO SUSP: 30.0000 mL | ORAL | Status: DC | PRN

## 2013-03-17 MED ORDER — BETHANECHOL CHLORIDE 25 MG PO TABS
25.0000 mg | ORAL_TABLET | Freq: Three times a day (TID) | ORAL | Status: DC
  Administered 2013-03-17 – 2013-03-18 (×4): 25 mg via ORAL
  Filled 2013-03-17 (×8): qty 1

## 2013-03-17 NOTE — Progress Notes (Signed)
Patient received at 1800 from 4N30. Patient alert and oriented no complaint of pain . Patient oriented to room and call bell system . Aspen collar intact . Skin intact . Continue with plan of care .                Cleotilde Neer

## 2013-03-17 NOTE — Progress Notes (Signed)
Patient ID: Cassandra Walker, female   DOB: 1932/02/13, 79 y.o.   MRN: [id number] The patient seems to be convalescing well neurologically she is stable. Her x-ray looks like stable alignment. Continue to mobilize with physical outpatient therapy agree with rehabilitation placement. I will be away for the remainder of this week if she still the hospital I will see her on Monday please feel free to reconsult one of my neurosurgical partners as needed.

## 2013-03-17 NOTE — Progress Notes (Signed)
Rehab admissions - I have approval from insurance carrier for acute inpatient rehab admission.  Bed available and can admit to inpatient rehab today.  Call me for questions.  #[phone] 

## 2013-03-17 NOTE — PMR Pre-admission (Signed)
PMR Admission Coordinator Pre-Admission Assessment  Patient: Cassandra Walker is an 79 y.o., female MRN: [id number] DOB: 1932-10-31 Height: 5\' 5"  (165.1 cm) Weight: 76.2 kg (167 lb 15.9 oz)              Insurance Information HMO: Yes    PPO:       PCP:       IPA:       80/20:       OTHER: Group # [id number] PRIMARY: A[employer] Medicare Complete      Policy#: [id number]      Subscriber: Justine Null CM Name: Bertram Denver      Phone#: [phone]     Fax#:   Pre-Cert#: [id number]      Employer: Retired Benefits:  Phone #: [phone]     Name:  Woodward Ku. Date: 11/06/11     Deduct: $0      Out of Pocket Max: $4900(met$82.19)      Life Max: None CIR: $295 days 1-5, $0 days 6+      SNF: $25 days 1-20, $152 days 21-49, $0 days 50-100 Outpatient: w/medical necessity     Co-Pay: $45/visit Home Health: 100%      Co-Pay: none DME: 80%     Co-Pay: 20% Providers: in network   Emergency Contact Information Contact Information   Name Relation Home Work Mobile   [place] Grandaughter [phone]     Henrine, Hayter   [phone]   Shelby Mattocks [phone]       Current Medical History  Patient Admitting Diagnosis: Incomplete tetraplegia secondary to spinal cord injury   History of Present Illness: A 79 y.o. female was on a riding lawnmower, put it in drive instead of reverse and ended up hitting a swing set and was launched over the handlebars striking her head and neck. Initially had difficulty moving was admitted on 03/07/13. She was noted be significantly weaker lower extremities as well as and patchy weakness throughout her upper extremities. She had complaints of severe neck pain with shocking sensations in back and numbness in BUE/BLE. MRI C-spine revealed anterior wedge compression fracture of C7 vertebral body, bilateral C6-C7 facet fractures with jumped C6 facet, relative retrolisthesis of compressed C7 with cord compression and edema. She was taken to OR emergently for ORIF C6-C7  with mass fixation /fusion C4-C7 by Dr. Wynetta Emery and has been on bedrest. Was fitted for thoracic brace with cervical. She has had improvement in strength but limited by LE weakness with pain. Follow up pain films with normal alignment. Continue to require intermittent in and out caths. Has been up in chair 05/12 with therapy and again today.  Tolerating up in chair and is feeding self.   Past Medical History  Past Medical History  Diagnosis Date  . Osteoarthritis   . Hyperlipidemia   . GERD (gastroesophageal reflux disease)   . Peripheral neuropathy   . Osteopenia   . DDD (degenerative disc disease)   . Shingles   . Colon cancer 1977  . Thyroid cancer 1966  . Peripheral vascular disease     Family History  family history includes Lung cancer in her father.  Prior Rehab/Hospitalizations:  Had outpatient therapy after neck fusion several yrs ago.   Current Medications  Current facility-administered medications:atenolol (TENORMIN) tablet 100 mg, 100 mg, Oral, Daily, Emina Riebock, NP, 100 mg at 03/17/13 1100;  bethanechol (URECHOLINE) tablet 25 mg, 25 mg, Oral, TID, Freeman Caldron, PA-C, 25 mg at 03/17/13 1100;  bisacodyl (DULCOLAX) suppository 10  mg, 10 mg, Rectal, Daily PRN, Cherylynn Ridges, MD;  docusate sodium (COLACE) capsule 100 mg, 100 mg, Oral, BID, Cherylynn Ridges, MD, 100 mg at 03/17/13 1058 enoxaparin (LOVENOX) injection 40 mg, 40 mg, Subcutaneous, Q24H, Freeman Caldron, PA-C;  feeding supplement (RESOURCE BREEZE) liquid 1 Container, 1 Container, Oral, TID BM, Liz Malady, MD, 1 Container at 03/16/13 2106;  hydrochlorothiazide (HYDRODIURIL) tablet 25 mg, 25 mg, Oral, Daily, Emina Riebock, NP, 25 mg at 03/17/13 1059 HYDROcodone-acetaminophen (NORCO/VICODIN) 5-325 MG per tablet 0.5-2 tablet, 0.5-2 tablet, Oral, Q4H PRN, Freeman Caldron, PA-C;  Ipratropium-Albuterol (COMBIVENT) respimat 2 puff, 2 puff, Inhalation, Q6H PRN, Liz Malady, MD;  levothyroxine (SYNTHROID,  LEVOTHROID) tablet 75 mcg, 75 mcg, Oral, QAC breakfast, Emina Riebock, NP, 75 mcg at 03/17/13 0857 lisinopril (PRINIVIL,ZESTRIL) tablet 40 mg, 40 mg, Oral, Daily, Emina Riebock, NP, 40 mg at 03/17/13 1100;  morphine 2 MG/ML injection 2 mg, 2 mg, Intravenous, Q4H PRN, Freeman Caldron, PA-C;  ondansetron [facility]) injection 4 mg, 4 mg, Intravenous, Q6H PRN, Wilmon Arms. Tsuei, MD, 4 mg at 03/11/13 0805;  ondansetron (ZOFRAN) tablet 4 mg, 4 mg, Oral, Q6H PRN, Wilmon Arms. Tsuei, MD polyethylene glycol (MIRALAX / GLYCOLAX) packet 17 g, 17 g, Oral, BID, Emina Riebock, NP, 17 g at 03/17/13 1059;  prochlorperazine (COMPAZINE) injection 10 mg, 10 mg, Intravenous, Q6H PRN, Liz Malady, MD, 10 mg at 03/12/13 1507;  tiZANidine (ZANAFLEX) tablet 4 mg, 4 mg, Oral, Q8H PRN, Liz Malady, MD;  traMADol Janean Sark) tablet 100 mg, 100 mg, Oral, Q6H, Freeman Caldron, PA-C, 100 mg at 03/17/13 0609  Patients Current Diet: General  Precautions / Restrictions Precautions Precautions: Cervical Precautions/Special Needs: Other (Spinal precautions.) Other Brace/Splint: cervical with thoracic extension Restrictions Weight Bearing Restrictions:  (left hemiparesis)   Prior Activity Level Community (5-7x/wk): Went out about every day.  Home Assistive Devices / Equipment Home Assistive Devices/Equipment: None Home Adaptive Equipment: Walker - rolling  Prior Functional Level Prior Function Level of Independence: Independent Able to Take Stairs?: Yes Driving: Yes Vocation: Retired  Current Functional Level Cognition  Arousal/Alertness: Awake/alert Overall Cognitive Status: Within Functional Limits for tasks assessed Memory: Decreased recall of precautions Orientation Level: Oriented to person;Oriented to place;Oriented to situation;Disoriented to time General Comments: Pt states "oh I was hoping I was about ready to go home"- Pt is unable to tolerate sitting or standing but was hoping for d/c home soon.     Extremity Assessment (includes Sensation/Coordination)  RUE ROM/Strength/Tone: Deficits RUE ROM/Strength/Tone Deficits: MMT 3+ out 5  shoulder flexion WFL for precautions RUE Sensation: Deficits RUE Sensation Deficits: numbness RUE Coordination:  (gross grasp 3 out 5 to hold styfoam cup)  RLE ROM/Strength/Tone: Deficits RLE ROM/Strength/Tone Deficits: AAROM WFL, strength hip flexion 3-/5, knee extension 3+/5, ankle dorsiflexion at least 3/5 RLE Coordination: Deficits RLE Coordination Deficits: absent to light touch    ADLs  Eating/Feeding: Set up Where Assessed - Eating/Feeding: Chair (eating lunch self feeding Rt UE) Grooming: Wash/dry face;Set up Where Assessed - Grooming: Supported sitting (wiping mouth and blowing nose) Lower Body Dressing: +1 Total assistance Where Assessed - Lower Body Dressing: Supine, head of bed flat Toilet Transfer: Simulated;+2 Total assistance Toilet Transfer: Patient Percentage: 40% Toilet Transfer Method: Stand pivot Toilet Transfer Equipment: Raised toilet seat with arms (or 3-in-1 over toilet) Equipment Used: Gait belt Transfers/Ambulation Related to ADLs: Pt stand pivot to the right side with pad used at hips to help facilitation hip extension. Pt with total (A)  to turn LT LE toward chair ADL Comments: Pt supine on arrival attempting to eat with sister assistance. Pt agreeable to OOB for meal. pt states "its okay I am about full anyway after x3 bites of mash potatoes and several bites of bread roll. pt completed bed mobility log roll left side and pushing with Rt UE into sitting. pt with LOB in all direction stat sitting. pt needed constant tactile input for sitting balance    Mobility  Bed Mobility: Rolling Left;Left Sidelying to Sit;Sitting - Scoot to [place] of Bed Rolling Right: With rail;2: Max assist Rolling Left: 3: Mod assist Rolling Left: Patient Percentage: 20% Left Sidelying to Sit: 2: Max assist;With rails;HOB flat Left Sidelying to Sit:  Patient Percentage: 30% Supine to Sit: 2: Max assist;HOB elevated Sitting - Scoot to Edge of Bed: 2: Max assist Sit to Sidelying Left: HOB flat;With rail;2: Max assist Scooting to [facility]: 2: Max assist    Transfers  Transfers: Stand Pivot Transfers Sit to Stand: 1: +2 Total assist;From elevated surface;From bed Sit to Stand: Patient Percentage: 30% Stand to Sit: 1: +2 Total assist;To elevated surface;To chair/3-in-1 Stand Pivot Transfers: 1: +2 Total assist;From elevated surface Stand Pivot Transfers: Patient Percentage: 20% Lateral/Scoot Transfers: 2: Max assist Lateral Transfers: Patient Percentage: 10% Transfer via Lift Equipment:  (Maximove pad placed behind pt for chair to bed)    Ambulation / Gait / Stairs / Producer, television/film/video Sitting - Balance Support: Feet supported;Bilateral upper extremity supported Static Sitting - Level of Assistance: 4: Min assist;2: Max assist Static Sitting - Comment/# of Minutes: EOB ~7 minutes; initially max assist with lean to left and posterior; pt's sense of midline/neutral is off as she felt she was leaning to her Rt; pt with trace to no abdominals to bring herself forward when loses her balance posteriorly; + trunk extensors to raise her torso from a flexed position; able to assist with Rt vs Lt weight shift with vc and manual assist    Special needs/care consideration BiPAP/CPAP No CPM No Continuous Drip IV 0.9% NS with KCL 20 meq/L 50 ml/hr Dialysis No        Life Vest No Oxygen No Special Bed No Trach Size No Wound Vac (area) No     Skin NO                               Bowel mgmt: No BM in the last 3-4 days Bladder mgmt: In and out catheterizations Diabetic mgmt NO    Previous Home Environment Living Arrangements: Alone Lives With: Alone Available Help at Discharge: Family Type of Home: House Home Layout: One level Home Access: Stairs to enter Entrance Stairs-Rails:  None Entrance Stairs-Number of Steps: 4 Bathroom Shower/Tub: Tub/shower unit;Door [employer] Toilet: Standard Home Care Services: No Additional Comments: states family can potentially work to assist at home as needed   Discharge Living Setting Plans for Discharge Living Setting: Patient's home;Alone;House (Will get caregivers for patient.) Type of Home at Discharge: House Discharge Home Layout: One level Discharge Home Access: Stairs to enter (Family looking at having a ramp built.) Entrance Stairs-Number of Steps: 4 Do you have any problems obtaining your medications?: No  Social/Family/Support Systems Patient Roles: Parent (Has 2 sons and 2 daughters.) Contact Information: Abigail Miyamoto - Granddaughter and POA Anticipated Caregiver: Family and hired caregivers Anticipated Industrial/product designer Information: [name] [phone]  Ability/Limitations of Caregiver: Family all work but will get assistance for patient. Caregiver Availability: Other (Comment) (Family will procure 24 hr care for patient.) Discharge Plan Discussed with Primary Caregiver: Yes Is Caregiver In Agreement with Plan?: Yes Does Caregiver/Family have Issues with Lodging/Transportation while Pt is in Rehab?: No   Goals/Additional Needs Patient/Family Goal for Rehab: PT/OT min A W/C level goals, no ST needs Expected length of stay: 3 weeks Cultural Considerations: None Dietary Needs: Regular diet with thin liquids Equipment Needs: TBD Pt/Family Agrees to Admission and willing to participate: Yes Program Orientation Provided & Reviewed with Pt/Caregiver Including Roles  & Responsibilities: Yes   Decrease burden of Care through IP rehab admission: Decrease number of caregivers, Bowel and bladder program and Patient/family education   Possible need for SNF placement upon discharge:  Yes.  Family working out procuring caregiver support for time of discharge.   Patient Condition: This patient's medical and functional  status has changed since the consult dated: 03/11/13 in which the Rehabilitation Physician determined and documented that the patient's condition is appropriate for intensive rehabilitative care in an inpatient rehabilitation facility. See "History of Present Illness" (above) for medical update. Functional changes are:  Currently requiring total assist +2 contributing 20-30%.  Tolerating up in chair for 2 hrs at a time.  Feeding self with her right hand.  Patient's medical and functional status update has been discussed with the Rehabilitation physician and patient remains appropriate for inpatient rehabilitation. Will admit to inpatient rehab today.  Preadmission Screen Completed By:  Trish Mage, 03/17/2013 12:36 PM ______________________________________________________________________   Discussed status with Dr. Riley Kill on05/13/14 at 1254 and received telephone approval for admission today.  Admission Coordinator:  Trish Mage, time1254/Date05/13/14

## 2013-03-17 NOTE — Interval H&P Note (Signed)
Cassandra Walker was admitted today to Inpatient Rehabilitation with the diagnosis of C6-7 fxs with myelopathy/ incomplete SCI.  The patient's history has been reviewed, patient examined, and there is no change in status.  Patient continues to be appropriate for intensive inpatient rehabilitation.  I have reviewed the patient's chart and labs.  Questions were answered to the patient's satisfaction.  SWARTZ,ZACHARY T 03/17/2013, 9:45 PM

## 2013-03-17 NOTE — H&P (View-Only) (Signed)
Physical Medicine and Rehabilitation Admission H&P    Chief Complaint  Patient presents with  . Incomplete tetraplegia due to spinal cord injury.   : HPI: Cassandra Walker is a 79 y.o. female was on a riding lawnmower, put it in drive instead of reverse and ended up hitting a swing set and was launched over the handlebars striking her head and neck. Initially had difficulty moving was admitted on 03/07/13. She was noted be significantly weaker lower extremities as well as and patchy weakness throughout her upper extremities. She had complaints of severe neck pain with shocking sensations in back and numbness in BUE/BLE. MRI C-spine revealed anterior wedge compression fracture of C7 vertebral body, bilateral C6-C7 facet fractures with jumped C6 facet, relative retrolisthesis of compressed C7 with cord compression and edema. She was taken to OR emergently for ORIF C6-C7 with mass fixation /fusion C4-C7 by Dr. Cram and has been on bedrest. Was fitted for thoracic brace with cervical. She has had improvement in strength but limited by LE weakness with pain. Follow up pain films with normal alignment.  Continue to require intermittent in and out caths.     Review of Systems  HENT: Positive for neck pain.   Eyes: Negative for blurred vision and double vision.  Respiratory: Negative for cough, shortness of breath and wheezing.   Cardiovascular: Negative for chest pain and palpitations.  Gastrointestinal: Positive for constipation. Negative for heartburn and nausea.  Genitourinary: Positive for urgency and frequency.  Musculoskeletal: Positive for back pain (gets ESI by Dr. Newton) and joint pain (bilateral groin pain).  Neurological: Positive for dizziness (occasionally PTA.), tingling (Left arm/left leg), sensory change, speech change, weakness and headaches (daily PTA.).   Past Medical History  Diagnosis Date  . Osteoarthritis   . Hyperlipidemia   . GERD (gastroesophageal reflux disease)   .  Peripheral neuropathy   . Osteopenia   . DDD (degenerative disc disease)   . Shingles   . Colon cancer 1977  . Thyroid cancer 1966  . Peripheral vascular disease    Past Surgical History  Procedure Laterality Date  . Thyroidectomy    . Total abdominal hysterectomy w/ bilateral salpingoophorectomy  1968  . Colon resection  1977  . Cervical laminectomy  1995  . Cataract extraction      bilateral  . Breast enhancement surgery      silicone  . Breast surgery    . Posterior cervical fusion/foraminotomy N/A 03/07/2013    Procedure: Cervical four -seven POSTERIOR CERVICAL FUSION;  Surgeon: Gary P Cram, MD;  Location: [facility];  Service: Neurosurgery;  Laterality: N/A;   Family History  Problem Relation Age of Onset  . Lung cancer Father    Social History:  reports that she quit smoking about 40 years ago. She does not have any smokeless tobacco history on file. Her alcohol and drug histories are not on file.   Allergies  Allergen Reactions  . Codeine    Medications Prior to Admission  Medication Sig Dispense Refill  . ALPRAZolam (XANAX) 1 MG tablet Take 1 mg by mouth at bedtime as needed for sleep.      . atenolol (TENORMIN) 100 MG tablet Take 100 mg by mouth daily.      . atorvastatin (LIPITOR) 10 MG tablet Take 5 mg by mouth daily.        . levothyroxine (SYNTHROID, LEVOTHROID) 75 MCG tablet Take 75 mcg by mouth daily.        . lisinopril-hydrochlorothiazide (PRINZIDE,ZESTORETIC)   20-12.5 MG per tablet Take 2 tablets by mouth daily.      . meloxicam (MOBIC) 15 MG tablet Take 15 mg by mouth daily.      . Naproxen Sodium (ALEVE) 220 MG CAPS Take by mouth as needed.          Home: Home Living Lives With: Alone Available Help at Discharge: Family Type of Home: House Home Access: Stairs to enter Entrance Stairs-Number of Steps: 4 Entrance Stairs-Rails: None Home Layout: One level Bathroom Shower/Tub: Tub/shower unit;Door Bathroom Toilet: Standard Home Adaptive Equipment:  Walker - rolling Additional Comments: states family can potentially work to assist at home as needed    Functional History: Prior Function Able to Take Stairs?: Yes Driving: Yes Vocation: Retired  Functional Status:  Mobility: Bed Mobility Bed Mobility: Supine to Sit;Sitting - Scoot to Edge of Bed Rolling Right: With rail;2: Max assist Rolling Left: 3: Mod assist;With rail Rolling Left: Patient Percentage: 20% Left Sidelying to Sit: 2: Max assist Left Sidelying to Sit: Patient Percentage: 30% Supine to Sit: 2: Max assist;HOB elevated Sitting - Scoot to Edge of Bed: 2: Max assist Sit to Sidelying Left: HOB flat;With rail;2: Max assist Scooting to HOB: 2: Max assist Transfers Transfers: Stand Pivot Transfers Sit to Stand: 1: +2 Total assist;From elevated surface;From bed Sit to Stand: Patient Percentage: 30% Stand to Sit: 1: +2 Total assist;To elevated surface;To chair/3-in-1 Stand Pivot Transfers: 1: +2 Total assist;From elevated surface Stand Pivot Transfers: Patient Percentage: 10% Lateral/Scoot Transfers: 1: +2 Total assist Lateral Transfers: Patient Percentage: 10% Transfer via Lift Equipment: Maximove (Maximove pad placed behind pt for chair to bed)      ADL: ADL Eating/Feeding: Set up Where Assessed - Eating/Feeding: Chair (eating lunch self feeding Rt UE) Grooming: Wash/dry face;Set up Where Assessed - Grooming: Supported sitting (wiping mouth and blowing nose) Lower Body Dressing: +1 Total assistance Where Assessed - Lower Body Dressing: Supine, head of bed flat Toilet Transfer: Simulated;+2 Total assistance Toilet Transfer Method: Stand pivot Toilet Transfer Equipment: Raised toilet seat with arms (or 3-in-1 over toilet) Equipment Used: Gait belt Transfers/Ambulation Related to ADLs: Pt stand pivot to the right side with pad used at hips to help facilitation hip extension. Pt with total (A) to turn LT LE toward chair ADL Comments: Pt supine on arrival  attempting to eat with sister assistance. Pt agreeable to OOB for meal. pt states "its okay I am about full anyway after x3 bites of mash potatoes and several bites of bread roll. pt completed bed mobility log roll left side and pushing with Rt UE into sitting. pt with LOB in all direction stat sitting. pt needed constant tactile input for sitting balance  Cognition: Cognition Overall Cognitive Status: Impaired/Different from baseline Arousal/Alertness: Awake/alert Orientation Level: Oriented to person;Oriented to place;Oriented to situation;Disoriented to time Cognition Arousal/Alertness: Awake/alert Behavior During Therapy: WFL for tasks assessed/performed Overall Cognitive Status: Impaired/Different from baseline Area of Impairment: Memory;Awareness Memory: Decreased recall of precautions Awareness: Emergent;Anticipatory General Comments: Pt states "oh I was hoping I was about ready to go home"- Pt is unable to tolerate sitting or standing but was hoping for d/c home soon.  Physical Exam: Blood pressure 114/54, pulse 56, temperature 97.9 F (36.6 C), temperature source Oral, resp. rate 17, height 5' 5" (1.651 m), weight 76.2 kg (167 lb 15.9 oz), SpO2 95.00%.  Physical Exam  Nursing note and vitals reviewed. Constitutional: She is oriented to person, place, and time. She appears well-developed and well-nourished.  HENT:  Head:   Normocephalic and atraumatic.  Right Ear: External ear normal.  Left Ear: External ear normal.  Eyes: Conjunctivae and EOM are normal. Pupils are equal, round, and reactive to light. Right eye exhibits no discharge. Left eye exhibits no discharge. No scleral icterus.  Neck: Normal range of motion. No JVD present. No tracheal deviation present. No thyromegaly present.  Cardiovascular: Normal rate and regular rhythm.   Pulmonary/Chest: Effort normal and breath sounds normal. No respiratory distress. She has no wheezes.  Abdominal: Soft. She exhibits no  distension. Bowel sounds are decreased. There is no tenderness.  Musculoskeletal: She exhibits tenderness. She exhibits no edema.  Bilateral hands with Dupuytren's contractures. Missing 2nd finger of left hand. Pain along neck and into right trapezius.   Lymphadenopathy:    She has no cervical adenopathy.  Neurological: She is alert and oriented to person, place, and time.  Hypersensitivity left shin and left forearm. DTRs diminished. Decreased LT to above chest. RUE is 4/5. RLE is 3/5 HF, 3+ KE, 4/5 Ankle. LUE is 2+ deltoid, bicep, tricep, grip and wrist 3 to 3+. LLE is 1-2/5 prox to distal.   Psychiatric: She has a normal mood and affect. Her behavior is normal. Judgment and thought content normal.    Results for orders placed during the hospital encounter of 03/07/13 (from the past 48 hour(s))  GLUCOSE, CAPILLARY     Status: Abnormal   Collection Time    03/15/13  2:53 PM      Result Value Range   Glucose-Capillary 109 (*) 70 - 99 mg/dL  GLUCOSE, CAPILLARY     Status: Abnormal   Collection Time    03/15/13  6:47 PM      Result Value Range   Glucose-Capillary 116 (*) 70 - 99 mg/dL  GLUCOSE, CAPILLARY     Status: Abnormal   Collection Time    03/15/13 10:09 PM      Result Value Range   Glucose-Capillary 115 (*) 70 - 99 mg/dL   Comment 1 Documented in Chart     Comment 2 Notify RN    GLUCOSE, CAPILLARY     Status: Abnormal   Collection Time    03/16/13  6:37 AM      Result Value Range   Glucose-Capillary 102 (*) 70 - 99 mg/dL   Comment 1 Documented in Chart     Comment 2 Notify RN    GLUCOSE, CAPILLARY     Status: Abnormal   Collection Time    03/16/13  9:50 PM      Result Value Range   Glucose-Capillary 159 (*) 70 - 99 mg/dL   Comment 1 Documented in Chart     Comment 2 Notify RN    GLUCOSE, CAPILLARY     Status: Abnormal   Collection Time    03/17/13  6:41 AM      Result Value Range   Glucose-Capillary 117 (*) 70 - 99 mg/dL   Comment 1 Documented in Chart     Comment  2 Notify RN     Dg Cerv Spine 1v Clearing  03/16/2013  *RADIOLOGY REPORT*  Clinical Data: C6-7 fracture.  LIMITED CERVICAL SPINE FOR TRAUMA CLEARING - 1 VIEW  Comparison: 03/09/2013  Findings: Changes of posterior fusion from C4-C7.  It is difficult to visualize the lower cervical spine due to overlying shoulders despite the swimmer's view.  There appears to be normal alignment.  IMPRESSION: Despite this swimmer's view, the lower cervical spine remains difficult to visualize.  There does appear   to be normal alignment. Otherwise, fine bony detail is obscured due to overlying shoulders.   Original Report Authenticated By: Kevin Dover, M.D.     Post Admission Physician Evaluation: 1. Functional deficits secondary  to c6-7 fx's with cord compression and myelopathy s/p decompression and C4-7 fusion. 2. Patient is admitted to receive collaborative, interdisciplinary care between the physiatrist, rehab nursing staff, and therapy team. 3. Patient's level of medical complexity and substantial therapy needs in context of that medical necessity cannot be provided at a lesser intensity of care such as a SNF. 4. Patient has experienced substantial functional loss from his/her baseline which was documented above under the "Functional History" and "Functional Status" headings.  Judging by the patient's diagnosis, physical exam, and functional history, the patient has potential for functional progress which will result in measurable gains while on inpatient rehab.  These gains will be of substantial and practical use upon discharge  in facilitating mobility and self-care at the household level. 5. Physiatrist will provide 24 hour management of medical needs as well as oversight of the therapy plan/treatment and provide guidance as appropriate regarding the interaction of the two. 6. 24 hour rehab nursing will assist with bladder management, bowel management, safety, skin/wound care, disease management, medication  administration, pain management and patient education  and help integrate therapy concepts, techniques,education, etc. 7. PT will assess and treat for/with: Lower extremity strength, range of motion, stamina, balance, functional mobility, safety, adaptive techniques and equipment, NMR, pain mgt, orthotic use and NS precautions.   Goals are: min assist to supervision. 8. OT will assess and treat for/with: ADL's, functional mobility, safety, upper extremity strength, adaptive techniques and equipment, NMR, pain mgt, orthotic use and NS precautions.   Goals are: mod I to min+ assist. 9. SLP will assess and treat for/with: n/a.  Goals are: n/a. 10. Case Management and Social Worker will assess and treat for psychological issues and discharge planning. 11. Team conference will be held weekly to assess progress toward goals and to determine barriers to discharge. 12. Patient will receive at least 3 hours of therapy per day at least 5 days per week. 13. ELOS: 3 weeks      Prognosis:  excellent   Medical Problem List and Plan: 1. DVT Prophylaxis/Anticoagulation: Pharmaceutical: Lovenox 2. Pain Management:  Pain remains a limiting factor. Tramadol added yesterday and oxycodone dose increased today. Family reports intermittent disorientation--?likely medication related.  Will monitor and titrate as indicated. Resume mobic for chronic pain. Add low dose neurontin for neuropathic pain.  3. Mood:  No signs of distress. Reports generalized pain but attempting to deal with this. Will have LCSW follow for evaluation. Team to provide ego support.  4. Neuropsych: This patient is capable of making decisions on her own behalf. 5. Neurogenic bowel and bladder: continue to check post void residual. Started on urecholine tid today to help with bladder function. Will check UA/UCS.  Will need adjustment of toileting program--repeat suppository today.  Set bowel program.  6. HTN: monitor with bid checks. Continue Lisinopril,  tenormin and HCTZ. 7. Leucocytosis: will check UA. Monitor for fevers. Encourage IS.   Cederic Mozley T. Dalan Cowger, MD, FAAPMR    03/17/2013   

## 2013-03-17 NOTE — Discharge Summary (Signed)
Agree with summary. 

## 2013-03-17 NOTE — Progress Notes (Signed)
Patient ID: Cassandra Walker, female   DOB: 1932-05-12, 79 y.o.   MRN: [id number]   LOS: 10 days   Subjective: No new c/o. Pain better controlled. Able to void.   Objective: Vital signs in last 24 hours: Temp:  [97.4 F (36.3 C)-98.5 F (36.9 C)] 97.7 F (36.5 C) (05/13 0600) Pulse Rate:  [56-65] 57 (05/13 0600) Resp:  [17-18] 18 (05/13 0600) BP: (106-148)/(52-62) 106/52 mmHg (05/13 0600) SpO2:  [96 %-98 %] 97 % (05/13 0600) Last BM Date: 03/14/13   Physical Exam General appearance: alert and no distress Resp: clear to auscultation bilaterally Cardio: regular rate and rhythm GI: normal findings: bowel sounds normal and soft, non-tender   Assessment/Plan: Fall from lawnmower  C5,6 fx with cord injury s/p fusion - CTO  HTN - Home meds Hypothyroidism: stable  Urinary retention: Wean urecholine FEN - Change prn narcotic to Norco  VTE - SCD's, start Lovenox Dispo -- Awaiting CIR, medically stable for discharge     Freeman Caldron, PA-C Pager: [phone] General Trauma PA Pager: [phone]   03/17/2013

## 2013-03-17 NOTE — Progress Notes (Signed)
Pt. D/c to CIR, report called and give to RN. Pt and family made award.   Thane Edu, RN

## 2013-03-17 NOTE — Progress Notes (Signed)
Physical Therapy Treatment Patient Details Name: LELANIA BIA MRN: [id number] DOB: 1932/04/14 Today's Date: 03/17/2013 Time: 4098-1191 PT Time Calculation (min): 24 min  PT Assessment / Plan / Recommendation Comments on Treatment Session  79 y.o. female admitted [facility] fall from riding lawn mower with resultant C7 anterior weidge compression fracture, C6/C7 facet fractures, and cord compromise. She underwent ORIF C6, C7 and post fusion C4-7. Pt continues to make slow progress with LLE stronger yet again today. Would like to try sliding board transfer, however this is difficult with chairs/BSC that do not have drop-arms. ? appropriate for [facility] next visit    Follow Up Recommendations  CIR     Does the patient have the potential to tolerate intense rehabilitation     Barriers to Discharge        Equipment Recommendations  Wheelchair (measurements PT);Wheelchair cushion (measurements PT)    Recommendations for Other Services    Frequency Min 4X/week   Plan Discharge plan remains appropriate;Frequency remains appropriate    Precautions / Restrictions Precautions Precautions: Cervical Required Braces or Orthoses: Other Brace/Splint Other Brace/Splint: cervical with thoracic extension   Pertinent Vitals/Pain 0/10 at rest; 8/10 neck pain after sitting EOB    Mobility  Bed Mobility Bed Mobility: Rolling Left;Left Sidelying to Sit;Sitting - Scoot to [place] of Bed Rolling Left: 3: Mod assist Left Sidelying to Sit: 2: Max assist;With rails;HOB flat Sitting - Scoot to [place] of Bed: 2: Max assist Details for Bed Mobility Assistance: Pt able to recall sequencing for rolling, however still needing significant assist to overcome gravity to initiate rolling; able to kick bil LEs off EOB in sidelying with gravity eliminated assist Transfers Transfers: Stand Pivot Transfers Sit to Stand: 1: +2 Total assist;From elevated surface;From bed Sit to Stand: Patient Percentage: 30% Stand Pivot  Transfers: 1: +2 Total assist;From elevated surface Stand Pivot Transfers: Patient Percentage: 20% Lateral/Scoot Transfers: 2: Max Banker via Lift Equipment:  (Maximove pad placed behind pt for chair to bed) Details for Transfer Assistance: educated pt on head/hip relationship with regards to transfers and utilized during lateral scoot to her Left on mattress (to reach more level part of mattress) and again with posterior scoot into recliner. Better effort with trunk extensors to hold trunk erect during sit to stand and pivot. Pt unable to unweight RLE during pivot and used PT's foot to pivot her foot.     Exercises General Exercises - Lower Extremity Ankle Circles/Pumps: AAROM;Both;5 reps;Other (comment) (?trace DF on lt, +PF; ) Long Arc Quad: AROM;Right;AAROM;Left;5 reps;Seated;Other (comment) (resisted flexion to place foot back on floor) Heel Slides: AAROM;Both;5 reps;Supine;Other (comment) (assisted flexion; resisted extension with incr strength LLE )        PT Goals Acute Rehab PT Goals Pt will Roll Supine to Left Side: with min assist;with rail PT Goal: Rolling Supine to Left Side - Progress: Progressing toward goal Pt will go Supine/Side to Sit: with mod assist PT Goal: Supine/Side to Sit - Progress: Progressing toward goal Pt will Sit at [facility] of Bed: with supervision;1-2 min;with bilateral upper extremity support PT Goal: Sit at Edge Of Bed - Progress: Progressing toward goal Pt will Transfer Bed to Chair/Chair to Bed: with +2 total assist PT Transfer Goal: Bed to Chair/Chair to Bed - Progress: Met  Visit Information  Last PT Received On: 03/17/13 Assistance Needed: +2 PT/OT Co-Evaluation/Treatment: Yes    Subjective Data  Subjective: Reported she sat up for 2 hours in recliner after therapy yesterday Patient Stated Goal: To  go to rehab   Cognition  Cognition Arousal/Alertness: Awake/alert Behavior During Therapy: WFL for tasks assessed/performed Overall  Cognitive Status: Within Functional Limits for tasks assessed    Balance  Balance Balance Assessed: Yes Static Sitting Balance Static Sitting - Balance Support: Feet supported;Bilateral upper extremity supported Static Sitting - Level of Assistance: 4: Min assist;2: Max assist Static Sitting - Comment/# of Minutes: EOB ~7 minutes; initially max assist with lean to left and posterior; pt's sense of midline/neutral is off as she felt she was leaning to her Rt; pt with trace to no abdominals to bring herself forward when loses her balance posteriorly; + trunk extensors to raise her torso from a flexed position; able to assist with Rt vs Lt weight shift with vc and manual assist  End of Session PT - End of Session Equipment Utilized During Treatment: Gait belt;Cervical collar (with thoracic extension) Activity Tolerance: Patient tolerated treatment well Patient left: in chair;with family/visitor present;with call bell/phone within reach Nurse Communication: Mobility status;Need for lift equipment   GP     Kaylie Ritter 03/17/2013, 11:49 AM Pager [phone]

## 2013-03-17 NOTE — Progress Notes (Signed)
UR completed 
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
## 2013-03-17 NOTE — Progress Notes (Signed)
Patient seen and examined.  Agree with PA's note.  
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
## 2013-03-18 ENCOUNTER — Inpatient Hospital Stay (HOSPITAL_COMMUNITY): Payer: Medicare Other | Admitting: *Deleted

## 2013-03-18 ENCOUNTER — Institutional Professional Consult (permissible substitution): Payer: Medicare Other | Admitting: Pulmonary Disease

## 2013-03-18 ENCOUNTER — Inpatient Hospital Stay (HOSPITAL_COMMUNITY): Payer: Medicare Other

## 2013-03-18 ENCOUNTER — Inpatient Hospital Stay (HOSPITAL_COMMUNITY): Payer: Medicare Other | Admitting: Occupational Therapy

## 2013-03-18 DIAGNOSIS — W19XXXA Unspecified fall, initial encounter: Secondary | ICD-10-CM

## 2013-03-18 DIAGNOSIS — N319 Neuromuscular dysfunction of bladder, unspecified: Secondary | ICD-10-CM

## 2013-03-18 DIAGNOSIS — IMO0002 Reserved for concepts with insufficient information to code with codable children: Secondary | ICD-10-CM

## 2013-03-18 DIAGNOSIS — K592 Neurogenic bowel, not elsewhere classified: Secondary | ICD-10-CM

## 2013-03-18 LAB — CBC WITH DIFFERENTIAL/PLATELET
Basophils Absolute: 0.1 10*3/uL (ref 0.0–0.1)
Basophils Relative: 0 % (ref 0–1)
Eosinophils Absolute: 0.4 10*3/uL (ref 0.0–0.7)
MCH: 28.3 pg (ref 26.0–34.0)
MCHC: 33.1 g/dL (ref 30.0–36.0)
Neutro Abs: 10.8 10*3/uL — ABNORMAL HIGH (ref 1.7–7.7)
Neutrophils Relative %: 69 % (ref 43–77)
Platelets: 346 10*3/uL (ref 150–400)
RDW: 14 % (ref 11.5–15.5)

## 2013-03-18 LAB — URINALYSIS, ROUTINE W REFLEX MICROSCOPIC
Glucose, UA: NEGATIVE mg/dL
Ketones, ur: NEGATIVE mg/dL
pH: 5.5 (ref 5.0–8.0)

## 2013-03-18 LAB — COMPREHENSIVE METABOLIC PANEL
ALT: 14 U/L (ref 0–35)
AST: 15 U/L (ref 0–37)
Albumin: 2.3 g/dL — ABNORMAL LOW (ref 3.5–5.2)
Alkaline Phosphatase: 82 U/L (ref 39–117)
BUN: 29 mg/dL — ABNORMAL HIGH (ref 6–23)
Potassium: 3.8 mEq/L (ref 3.5–5.1)
Sodium: 136 mEq/L (ref 135–145)
Total Protein: 5.7 g/dL — ABNORMAL LOW (ref 6.0–8.3)

## 2013-03-18 LAB — URINE MICROSCOPIC-ADD ON

## 2013-03-18 MED ORDER — OXYCODONE HCL 5 MG PO TABS
10.0000 mg | ORAL_TABLET | Freq: Two times a day (BID) | ORAL | Status: DC
  Administered 2013-03-19 – 2013-04-08 (×36): 10 mg via ORAL
  Filled 2013-03-18 (×10): qty 2
  Filled 2013-03-18: qty 1
  Filled 2013-03-18 (×17): qty 2
  Filled 2013-03-18: qty 1
  Filled 2013-03-18 (×10): qty 2

## 2013-03-18 NOTE — Progress Notes (Signed)
Patient now alert and oriented x4.  Will continue to monitor.

## 2013-03-18 NOTE — Progress Notes (Signed)
Patient's family stated that patient was confused upon returning from radiology.  She did not know what room she was in or why she was here.  Patient had been alert and oriented previously.  Family is questioning if medications may have caused confusion.  Deatra Ina, PA notified of patient's episode of confusion.  No new orders received at this time; will continue to monitor.

## 2013-03-18 NOTE — Progress Notes (Addendum)
Physical Therapy Session Note  Patient Details  Name: Cassandra Walker MRN: [id number] Date of Birth: [birth year]  Today's Date: 03/18/2013 Time: 1335-1400 Time Calculation (min): 25 min Pt missed 35 min of session due to pain and fatigue.  Pt and family in the room as therapist arrive and pt back in bed. Pt declined OOB or supine therex due to increased pain and requesting to rest. Asked therapist to come back later. Granddaughter and granddaughter's mother in law asked questions about therapy and education provided on rehab schedule/expectations, pt's current state of assist with physical mobility, goals for home, and education on transfer techniques we are using at this time/sitting balance/strength. Came back closer to end of session and pt agreeable to try supine LE therex with AAROM L > R for heel slides, ankle pumps, dorsiflexion stretch, SAQ and hip abduction x 10 reps each bilaterally. Encouraged pt to work on sitting EOB or get OOB for next session with OT.     Discussed pt's low activity tolerance and increased pain with PA and received verbal order to decrease pt's therapy from 4 hours per day to 3 hours per day until tolerance increases.  Therapy Documentation Precautions:  Precautions Precautions: Cervical Precaution Comments: need to clarify orders with MD if pt has back precautions Required Braces or Orthoses: Other Brace/Splint Other Brace/Splint: Cervical hard collar with thoracic brace component Restrictions Weight Bearing Restrictions: No   Pain: Reports significant pain in her neck and generalized fatigue - RN just had given pt pain medication.  See FIM for current functional status  Therapy/Group: Individual Therapy  [name] [facility] 03/18/2013, 1:25 PM

## 2013-03-18 NOTE — Progress Notes (Signed)
Patient information reviewed and entered into eRehab system by Alfonza Toft, RN, CRRN, PPS Coordinator.  Information including medical coding and functional independence measure will be reviewed and updated through discharge.     Per nursing patient was given "Data Collection Information Summary for Patients in Inpatient Rehabilitation Facilities with attached "Privacy Act Statement-Health Care Records" upon admission.  

## 2013-03-18 NOTE — Progress Notes (Signed)
Subjective/Complaints: Had a sense her bowels needed to move this morning before she emptied. Slept fairly well. ;pain under fair control A 12 point review of systems has been performed and if not noted above is otherwise negative.   Objective: Vital Signs: Blood pressure 127/74, pulse 59, temperature 97.9 F (36.6 C), temperature source Oral, resp. rate 20, height 5' 5.5" (1.664 m), weight 75.433 kg (166 lb 4.8 oz), SpO2 90.00%. Dg Cerv Spine 1v Clearing  03/16/2013   *RADIOLOGY REPORT*  Clinical Data: C6-7 fracture.  LIMITED CERVICAL SPINE FOR TRAUMA CLEARING - 1 VIEW  Comparison: 03/09/2013  Findings: Changes of posterior fusion from C4-C7.  It is difficult to visualize the lower cervical spine due to overlying shoulders despite the swimmer's view.  There appears to be normal alignment.  IMPRESSION: Despite this swimmer's view, the lower cervical spine remains difficult to visualize.  There does appear to be normal alignment. Otherwise, fine bony detail is obscured due to overlying shoulders.   Original Report Authenticated By: Charlett Nose, M.D.   No results found for this basename: WBC, HGB, HCT, PLT,  in the last 72 hours No results found for this basename: NA, K, CL, CO, GLUCOSE, BUN, CREATININE, CALCIUM,  in the last 72 hours CBG (last 3)   Recent Labs  03/16/13 0637 03/16/13 2150 03/17/13 0641  GLUCAP 102* 159* 117*    Wt Readings from Last 3 Encounters:  03/17/13 75.433 kg (166 lb 4.8 oz)  03/09/13 76.2 kg (167 lb 15.9 oz)  03/09/13 76.2 kg (167 lb 15.9 oz)    Physical Exam:  Constitutional: She is oriented to person, place, and time. She appears well-developed and well-nourished.  HENT:  Head: Normocephalic and atraumatic.  Right Ear: External ear normal.  Left Ear: External ear normal.  Eyes: Conjunctivae and EOM are normal. Pupils are equal, round, and reactive to light. Right eye exhibits no discharge. Left eye exhibits no discharge. No scleral icterus.  Neck: Normal  range of motion. No JVD present. No tracheal deviation present. No thyromegaly present.  Cardiovascular: Normal rate and regular rhythm.  Pulmonary/Chest: Effort normal and breath sounds normal. No respiratory distress. She has no wheezes.  Abdominal: Soft. She exhibits no distension. Bowel sounds are decreased. There is no tenderness.  Musculoskeletal: She exhibits tenderness. She exhibits no edema.  Bilateral hands with Dupuytren's contractures. Missing 2nd finger of left hand. Pain along neck and into right trapezius.  Lymphadenopathy:  She has no cervical adenopathy.  Neurological: She is alert and oriented to person, place, and time.  Hypersensitivity left shin and left forearm. DTRs diminished. Decreased LT to above chest. .MOTOR TESTING:           Muscle  Right Left  Deltoid  4- 4 Triceps 2+ 2 Biceps  4 4 Wrist ext tr 0 HI  Tr-1 0 HF  2+ tr KE  3- Tr-1 ADF  3-4 1 APF  3-4 1   .  Psychiatric: She has a normal mood and affect. Her behavior is normal. Judgment and thought content normal.   Assessment/Plan: 1. 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 Physiatrist is providing close team supervision and 24 hour management of active medical problems listed below. Physiatrist and rehab team continue to assess barriers to discharge/monitor patient progress toward functional and medical goals. FIM:  Comprehension Comprehension Mode: Auditory Comprehension: 5-Understands complex 90% of the time/Cues < 10% of the time  Expression Expression Mode: Verbal Expression: 5-Expresses basic 90% of the time/requires cueing < 10% of the time.  Social Interaction Social Interaction: 5-Interacts appropriately 90% of the time - Needs monitoring or encouragement for participation or interaction.       Medical Problem List and Plan:  1.  DVT Prophylaxis/Anticoagulation: Pharmaceutical: Lovenox  2. Pain Management: Pain remains a limiting factor. Tramadol added yesterday and oxycodone dose increased today.  Marland Kitchen Resumed mobic for chronic pain. Added low dose neurontin for neuropathic pain. Follow for pain relief and mental status. 3. Mood: No signs of distress. Reports generalized pain but attempting to deal with this. Will have LCSW follow for evaluation. Team to provide ego support.  4. Neuropsych: This patient is capable of making decisions on her own behalf.  5. Neurogenic bowel and bladder: continue to check post void residual. Started on urecholine tid today to help with bladder function. Pending UA/UCS. Bowel and bladder program for now until we see improvement 6. HTN: monitor with bid checks. Continue Lisinopril, tenormin and HCTZ.  7. Leucocytosis: will check UA. Monitor for fevers. Encourage IS   LOS (Days) 1 A FACE TO FACE EVALUATION WAS PERFORMED  Shermar Friedland T 03/18/2013 7:43 AM

## 2013-03-18 NOTE — Evaluation (Addendum)
Physical Therapy Assessment and Plan  Patient Details  Name: Cassandra Walker MRN: [id number] Date of Birth: 08-12-1932  PT Diagnosis: Abnormal posture, Difficulty walking, Impaired sensation, Muscle weakness and Quadriplegia Rehab Potential: Good ELOS: 3-3.5 weeks   Today's Date: 03/18/2013 Time: 0800-0855 Time Calculation (min): 55 min  Problem List:  Patient Active Problem List   Diagnosis Date Noted  . Urinary retention 03/17/2013  . Fall 03/10/2013  . Fracture of C5 vertebra, closed 03/10/2013  . C6 cervical fracture 03/10/2013  . Incomplete quadriplegia at C5-6 level 03/10/2013  . HTN (hypertension) 03/10/2013  . Hyperglycemia 03/10/2013  . Pulmonary nodule 07/24/2011    Past Medical History:  Past Medical History  Diagnosis Date  . Osteoarthritis   . Hyperlipidemia   . GERD (gastroesophageal reflux disease)   . Peripheral neuropathy   . Osteopenia   . DDD (degenerative disc disease)   . Shingles   . Colon cancer 1977  . Thyroid cancer 1966  . Peripheral vascular disease    Past Surgical History:  Past Surgical History  Procedure Laterality Date  . Thyroidectomy    . Total abdominal hysterectomy w/ bilateral salpingoophorectomy  1968  . Colon resection  1977  . Cervical laminectomy  1995  . Cataract extraction      bilateral  . Breast enhancement surgery      silicone  . Breast surgery    . Posterior cervical fusion/foraminotomy N/A 03/07/2013    Procedure: Cervical four -seven POSTERIOR CERVICAL FUSION;  Surgeon: Mariam Dollar, MD;  Location: [facility];  Service: Neurosurgery;  Laterality: N/A;    Assessment & Plan Clinical Impression: Patient is a 79 y.o. year old female who was on a riding lawnmower, put it in drive instead of reverse and ended up hitting a swing set and was launched over the handlebars striking her head and neck. Initially had difficulty moving was admitted on 03/07/13. She was noted be significantly weaker lower extremities as well as  and patchy weakness throughout her upper extremities. She had complaints of severe neck pain with shocking sensations in back and numbness in BUE/BLE. MRI C-spine revealed anterior wedge compression fracture of C7 vertebral body, bilateral C6-C7 facet fractures with jumped C6 facet, relative retrolisthesis of compressed C7 with cord compression and edema. She was taken to OR emergently for ORIF C6-C7 with mass fixation /fusion C4-C7 by Dr. Wynetta Emery and has been on bedrest. Was fitted for thoracic brace with cervical. She has had improvement in strength but limited by LE weakness with pain. Follow up pain films with normal alignment. Continue to require intermittent in and out caths. Patient transferred to CIR on 03/17/2013 .   Patient currently requires max A to +2 assist with mobility secondary to muscle weakness, muscle joint tightness and muscle paralysis, decreased cardiorespiratoy endurance, decreased coordination and decreased sitting balance, decreased standing balance, decreased postural control and decreased balance strategies.  Prior to hospitalization, patient was independent  with mobility and lived with Alone in a House.  Home access is 4Stairs to enter without railings.  Patient will benefit from skilled PT intervention to maximize safe functional mobility, minimize fall risk and decrease caregiver burden for planned discharge home with intermittent assist.  Anticipate patient will benefit from follow up [facility] at discharge.  PT Assessment Rehab Potential: Good Barriers to Discharge: Inaccessible home environment;Decreased caregiver support (Likely recommend ramp for home entry) PT Plan PT Intensity: Minimum of 1-2 x/day ,45 to 90 minutes PT Frequency: 5 out of 7 days  PT Duration Estimated Length of Stay: 3-3.5 weeks PT Treatment/Interventions: Ambulation/gait training;Balance/vestibular training;Community reintegration;Discharge planning;Disease management/prevention;DME/adaptive equipment  instruction;Functional mobility training;Neuromuscular re-education;Pain management;Patient/family education;Psychosocial support;Skin care/wound management;Splinting/orthotics;Stair training;Therapeutic Activities;Therapeutic Exercise;UE/LE Strength taining/ROM;UE/LE Coordination activities;Wheelchair propulsion/positioning PT Recommendation Follow Up Recommendations: Home health PT;24 hour supervision/assistance Patient destination: Home Equipment Recommended: Wheelchair (measurements);Wheelchair cushion (measurements) (other equipment TBD based on pt progress)  Skilled Therapeutic Intervention Individual treatment initiated with focus on bed mobility techniques using bed rails to assist, sitting balance EOB for postural control and balance retraining to work on pt feeding herself breakfast (min to mod A needed to maintain balance), introduction of slide board transfer to w/c and proper positioning in w/c, and w/c propulsion with UE's and using RLE (difficulty with L hand due to decreased intrinsic control - will benefit from theraband around RIM to help with grip but pt able to adapt with assist).   PT Evaluation Precautions/Restrictions Precautions Precautions: Cervical Precaution Comments: need to clarify orders with MD if pt has back precautions Required Braces or Orthoses: Other Brace/Splint Other Brace/Splint: Cervical hard collar with thoracic brace component  Pain  Discomfort in neck with mobility; repositioned as needed. Home Living/Prior Functioning Home Living Lives With: Alone Available Help at Discharge: Available PRN/intermittently;Family Type of Home: House Home Access: Stairs to enter [employer] of Steps: 4 Entrance Stairs-Rails: None Home Layout: One level Home Adaptive Equipment: Walker - rolling Additional Comments: Pt reports family would be available intermittently for assist only Prior Function Level of Independence: Independent with basic  ADLs;Independent with homemaking with ambulation;Independent with transfers Able to Take Stairs?: Yes Driving: Yes Vision/Perception  Perception Perception: Within Functional Limits  Cognition Comments: Pt appears a little bit slow with processing and new information, but generally appears cognitively [facility] Sensation Sensation Light Touch: Appears Intact (reports occasional numbess in LE's) Coordination Gross Motor Movements are Fluid and Coordinated: No Motor  Motor Motor: Tetraplegia;Abnormal postural alignment and control   Trunk/Postural Assessment  Cervical Assessment Cervical Assessment: Exceptions to [facility] (cervical collar; NT) Thoracic Assessment Thoracic Assessment: Exceptions to [facility] (TLSO brace; NT) Lumbar Assessment Lumbar Assessment: Exceptions to [facility] (posterior pelvic tilt; posterior-lateral lean in sitting) Postural Control Postural Control: Deficits on evaluation Trunk Control: decreased in sitting without back support  Balance Balance Balance Assessed: Yes Static Sitting Balance Static Sitting - Level of Assistance: 4: Min assist Dynamic Sitting Balance Dynamic Sitting - Level of Assistance: 3: Mod assist;2: Max assist Extremity Assessment      RLE Assessment RLE Assessment: Exceptions to [facility] (grossly 3/5; proximal weakness > distally) LLE Assessment LLE Assessment: Exceptions to [facility] (2-/5 hip; 3-/5 knee extension; limited ankle dorsiflexion)  FIM:  FIM - Bed/Chair Transfer Bed/Chair Transfer Assistive Devices: HOB elevated;Bed rails;Sliding board Bed/Chair Transfer: 2: Sit > Supine: Max A (lifting assist/Pt. 25-49%);2: Supine > Sit: Max A (lifting assist/Pt. 25-49%);1: Bed > Chair or W/C: Total A (helper does all/Pt. < 25%);1: Two helpers (second person to stabilize chair/assist with board) FIM - Locomotion: Wheelchair Locomotion: Wheelchair: 1: Travels less than 50 ft with moderate assistance (Pt: 50 - 74%)   Refer to Care Plan for Long Term  Goals  Recommendations for other services: None  Discharge Criteria: Patient will be discharged from PT if patient refuses treatment 3 consecutive times without medical reason, if treatment goals not met, if there is a change in medical status, if patient makes no progress towards goals or if patient is discharged from hospital.  The above assessment, treatment plan, treatment alternatives and goals were discussed and mutually  agreed upon: by patient  Tedd Sias 03/18/2013, 9:17 AM

## 2013-03-18 NOTE — Evaluation (Signed)
Occupational Therapy Assessment and Plan & Session Notes  Patient Details  Name: Cassandra Walker MRN: [id number] Date of Birth: 11-13-1931  OT Diagnosis: abnormal posture, acute pain, muscle weakness (generalized) and quadriparesis at level C6-C7 Rehab Potential: Rehab Potential: Good ELOS: 21-25 days   Today's Date: 03/18/2013  ASSESSMENT AND PLAN  Problem List:  Patient Active Problem List   Diagnosis Date Noted  . Urinary retention 03/17/2013  . Fall 03/10/2013  . Fracture of C5 vertebra, closed 03/10/2013  . C6 cervical fracture 03/10/2013  . Incomplete quadriplegia at C5-6 level 03/10/2013  . HTN (hypertension) 03/10/2013  . Hyperglycemia 03/10/2013  . Pulmonary nodule 07/24/2011    Past Medical History:  Past Medical History  Diagnosis Date  . Osteoarthritis   . Hyperlipidemia   . GERD (gastroesophageal reflux disease)   . Peripheral neuropathy   . Osteopenia   . DDD (degenerative disc disease)   . Shingles   . Colon cancer 1977  . Thyroid cancer 1966  . Peripheral vascular disease    Past Surgical History:  Past Surgical History  Procedure Laterality Date  . Thyroidectomy    . Total abdominal hysterectomy w/ bilateral salpingoophorectomy  1968  . Colon resection  1977  . Cervical laminectomy  1995  . Cataract extraction      bilateral  . Breast enhancement surgery      silicone  . Breast surgery    . Posterior cervical fusion/foraminotomy N/A 03/07/2013    Procedure: Cervical four -seven POSTERIOR CERVICAL FUSION;  Surgeon: Mariam Dollar, MD;  Location: [facility];  Service: Neurosurgery;  Laterality: N/A;    Clinical Impression: Cassandra Walker is a 79 y.o. female was on a riding lawnmower, put it in drive instead of reverse and ended up hitting a swing set and was launched over the handlebars striking her head and neck. Initially had difficulty moving was admitted on 03/07/13. She was noted be significantly weaker lower extremities as well as and patchy  weakness throughout her upper extremities. She had complaints of severe neck pain with shocking sensations in back and numbness in BUE/BLE. MRI C-spine revealed anterior wedge compression fracture of C7 vertebral body, bilateral C6-C7 facet fractures with jumped C6 facet, relative retrolisthesis of compressed C7 with cord compression and edema. She was taken to OR emergently for ORIF C6-C7 with mass fixation /fusion C4-C7 by Dr. Wynetta Emery and has been on bedrest. Was fitted for thoracic brace with cervical. She has had improvement in strength but limited by LE weakness with pain. Follow up pain films with normal alignment. Continue to require intermittent in and out caths.   Patient transferred to CIR on 03/17/2013 .    Patient currently requires total with basic self-care skills and IADL secondary to muscle weakness, muscle joint tightness and muscle paralysis, decreased coordination and decreased motor planning and decreased sitting balance, decreased postural control and decreased balance strategies.  Prior to hospitalization, patient could complete ADLs and IADLs independently.   Patient will benefit from skilled intervention to increase independence with basic self-care skills prior to discharge home with family assisting.  Anticipate patient will require min-mod assist and follow up home health.  OT - End of Session Activity Tolerance: Tolerates 10 - 20 min activity with multiple rests Endurance Deficit: Yes OT Assessment Rehab Potential: Good Barriers to Discharge: Decreased caregiver support Barriers to Discharge Comments: PTA patient lived alone, she states she has some family that can assist at d/c; Goals set for an overall min-mod assist  level OT Plan OT Intensity: Minimum of 1-2 x/day, 45 to 90 minutes OT Frequency: 5 out of 7 days OT Duration/Estimated Length of Stay: 21-25 days OT Treatment/Interventions: Metallurgist training;Community reintegration;Discharge planning;Disease  mangement/prevention;DME/adaptive equipment instruction;Cognitive remediation/compensation;Functional mobility training;Functional electrical stimulation;Neuromuscular re-education;Pain management;Patient/family education;Psychosocial support;Self Care/advanced ADL retraining;Skin care/wound managment;Splinting/orthotics;Therapeutic Activities;Therapeutic Exercise;UE/LE Strength taining/ROM;UE/LE Coordination activities;Wheelchair propulsion/positioning;Visual/perceptual remediation/compensation OT Recommendation Patient destination: Home Follow Up Recommendations: Home health OT Equipment Recommended: 3 in 1 bedside comode;Tub/shower bench   Precautions/Restrictions  Trunk & Cervical Brace Fall Risk  General Chart Reviewed: Yes Family/Caregiver Present: No  Pain Pain Assessment Pain Assessment: 0-10 Pain Score:   7 Pain Type: Acute pain;Surgical pain Pain Location: Neck Pain Orientation: Posterior Pain Descriptors: Aching Pain Intervention(s): RN made aware  Home Living/Prior Functioning Home Living Lives With: Alone Available Help at Discharge: Available PRN/intermittently;Family Type of Home: House Home Access: Stairs to enter [employer] of Steps: 4 Entrance Stairs-Rails: None Home Layout: One level Bathroom Shower/Tub: Tub/shower unit;Door Bathroom Toilet: Standard Bathroom Accessibility:  (patient unsure) Home Adaptive Equipment: Walker - rolling Additional Comments: Pt reports family would be available intermittently for assist only IADL History Homemaking Responsibilities: Yes Meal Prep Responsibility: Primary Laundry Responsibility: Primary Cleaning Responsibility: Primary Bill Paying/Finance Responsibility: Primary Shopping Responsibility: Primary Child Care Responsibility: No Current License: Yes Mode of Transportation: Car Occupation: Retired Type of Occupation: Retired from [employer] Prior Function Level of Independence: Independent with  basic ADLs;Independent with homemaking with ambulation;Independent with transfers Able to Take Stairs?: Yes Driving: Yes Leisure: Hobbies-yes (Comment) Comments: spend time with grandchildren, shopping, garden, etc..  ADL - See FIM  Vision/Perception  Vision - History Baseline Vision: Wears glasses all the time Patient Visual Report: Other (comment) (pt reports her vision has gotten worse since accident;) Vision - Assessment Vision Assessment: Vision impaired - to be further tested in functional context Perception Perception: Within Functional Limits Praxis Praxis: Intact   Cognition Overall Cognitive Status: Within Functional Limits for tasks assessed Arousal/Alertness: Awake/alert Orientation Level: Oriented X4 Memory: Appears intact Awareness: Appears intact Problem Solving: Appears intact Safety/Judgment: Appears intact  Sensation Sensation Light Touch: Appears Intact (BUEs) Coordination Gross Motor Movements are Fluid and Coordinated: No (left weaker than right) Fine Motor Movements are Fluid and Coordinated: No (left weaker than right)  Motor - See Evaluation Navigator   Trunk/Postural Assessment - See Evaluation Navigator   Balance- See Evaluation Navigator   Extremity/Trunk Assessment RUE Assessment RUE Assessment: Exceptions to [facility] RUE AROM (degrees) RUE Overall AROM Comments: decreased overall RUE AROM RUE PROM (degrees) RUE Overall PROM Comments: PROM is [facility] RUE Strength Right Shoulder Flexion: 3+/5 Right Elbow Flexion: 4/5 Right Elbow Extension: 3/5 Right Forearm Pronation: 4/5 Right Forearm Supination: 4/5 Right Wrist Flexion: 3/5 Right Wrist Extension: 3/5 Gross Grasp: Impaired LUE Assessment LUE Assessment: Exceptions to WFL LUE AROM (degrees) LUE Overall AROM Comments: decreased overall AROM throuhgout LUE LUE PROM (degrees) LUE Overall PROM Comments: PROM is WFL, left shoulders ROM is decreased secondary to pre-morbid condition (patient  fractured shoulder ~4 years ago) LUE Strength Left Shoulder Flexion: 3/5 Left Elbow Flexion: 4/5 Left Elbow Extension: 3/5 Left Forearm Pronation: 3+/5 Left Forearm Supination: 3+/5 Left Wrist Flexion: 3+/5 Left Wrist Extension: 3+/5 Gross Grasp: Impaired  FIM:  FIM - Eating Eating Activity: 0: Activity did not occur FIM - Grooming Grooming Steps: Wash, rinse, dry hands;Oral care, brush teeth, clean dentures Grooming: 2: Patient completes 1 of 4 or 2 of 5 steps FIM - Bathing Bathing: 0: Activity did not occur FIM -  Upper Body Dressing/Undressing Upper body dressing/undressing: 0: Wears gown/pajamas-no public clothing FIM - Lower Body Dressing/Undressing Lower body dressing/undressing: 0: Wears Oceanographer FIM - Banker Devices: HOB elevated;Bed rails;Sliding board Bed/Chair Transfer: 1: Two helpers FIM - Archivist Transfers: 0-Activity did not occur FIM - IT consultant Transfers: 0-Activity did not occur or was simulated   Refer to Care Plan for Long Term Goals  Recommendations for other services: None  Discharge Criteria: Patient will be discharged from OT if patient refuses treatment 3 consecutive times without medical reason, if treatment goals not met, if there is a change in medical status, if patient makes no progress towards goals or if patient is discharged from hospital.  The above assessment, treatment plan, treatment alternatives and goals were discussed and mutually agreed upon: by patient  ---------------------------------------------------------------------------------------------------------------------------  SESSION NOTES  Session #1 [id number] - 60 Minutes Individual Therapy Patient with 7/10 complaints of pain in neck; RN aware Initial 1:1 occupational therapy evaluation completed. Focused skilled intervention on functional use of bilateral UEs, grooming tasks  seated in w/c at sink, w/c -> edge of bed slide board transfer with total assist X2, bed mobility, and overall activity tolerance/endurance. Patient with huge incontinent bowel movement. At end of session, left patient supine in bed in chair position with call bell & phone within reach.   Session #2 [id number] - 50 Minutes Patient missed 10 minutes of skilled therapy secondary to fatigue Individual Therapy No complaints of pain Patient found supine in bed. Patient required mod encouragement in order to participate in therapy session. Patient engaged in bed mobility with max assist. Then focused skilled intervention on static & dynamic sitting balance/tolerance/endurance, functional use of bilateral UEs, shoulder retraction exercises, knee flexion/extension exercises, and overall activity tolerance/endurance. Patient able to maintain dynamic sitting balance with min-mod assist. Also focused on patient re-gaining her balance/finding her balance. Patient able to sit edge of bed ~30 minutes without any rest breaks. At end of session, left patient supine in bed with nursing present for in/out cath.   Azir Muzyka 03/18/2013, 11:58 AM

## 2013-03-19 ENCOUNTER — Inpatient Hospital Stay (HOSPITAL_COMMUNITY): Payer: Medicare Other

## 2013-03-19 ENCOUNTER — Inpatient Hospital Stay (HOSPITAL_COMMUNITY): Payer: Medicare Other | Admitting: Occupational Therapy

## 2013-03-19 ENCOUNTER — Inpatient Hospital Stay (HOSPITAL_COMMUNITY): Payer: Medicare Other | Admitting: *Deleted

## 2013-03-19 ENCOUNTER — Ambulatory Visit (HOSPITAL_COMMUNITY): Payer: Medicare Other | Admitting: *Deleted

## 2013-03-19 LAB — GLUCOSE, CAPILLARY

## 2013-03-19 LAB — CBC
Hemoglobin: 11.8 g/dL — ABNORMAL LOW (ref 12.0–15.0)
RBC: 4.18 MIL/uL (ref 3.87–5.11)

## 2013-03-19 LAB — BASIC METABOLIC PANEL
CO2: 30 mEq/L (ref 19–32)
Glucose, Bld: 105 mg/dL — ABNORMAL HIGH (ref 70–99)
Potassium: 3.7 mEq/L (ref 3.5–5.1)
Sodium: 135 mEq/L (ref 135–145)

## 2013-03-19 MED ORDER — BISACODYL 10 MG RE SUPP
10.0000 mg | Freq: Every day | RECTAL | Status: DC
Start: 2013-03-19 — End: 2013-04-09
  Administered 2013-03-20 – 2013-04-09 (×21): 10 mg via RECTAL
  Filled 2013-03-19 (×20): qty 1

## 2013-03-19 MED ORDER — ENSURE COMPLETE PO LIQD
120.0000 mL | Freq: Four times a day (QID) | ORAL | Status: DC
  Administered 2013-03-19 – 2013-03-23 (×13): 120 mL via ORAL
  Administered 2013-03-23: 237 mL via ORAL
  Administered 2013-03-23: 13:00:00 via ORAL
  Administered 2013-03-23 – 2013-03-24 (×2): 237 mL via ORAL
  Administered 2013-03-24: 120 mL via ORAL
  Administered 2013-03-24: 237 mL via ORAL
  Administered 2013-03-24: 14:00:00 via ORAL
  Administered 2013-03-25 – 2013-04-09 (×48): 120 mL via ORAL

## 2013-03-19 MED ORDER — BETHANECHOL CHLORIDE 25 MG PO TABS
12.5000 mg | ORAL_TABLET | Freq: Three times a day (TID) | ORAL | Status: DC
  Administered 2013-03-19 – 2013-03-22 (×11): 12.5 mg via ORAL
  Administered 2013-03-23: 16:00:00 via ORAL
  Administered 2013-03-23: 12.5 mg via ORAL
  Administered 2013-03-23: 09:00:00 via ORAL
  Administered 2013-03-24 (×2): 12.5 mg via ORAL
  Administered 2013-03-24: 23:00:00 via ORAL
  Administered 2013-03-25 – 2013-03-27 (×7): 12.5 mg via ORAL
  Filled 2013-03-19 (×27): qty 0.5

## 2013-03-19 MED ORDER — BISACODYL 10 MG RE SUPP: 10.0000 mg | Freq: Every day | RECTAL | Status: DC

## 2013-03-19 MED ORDER — GABAPENTIN 100 MG PO CAPS
100.0000 mg | ORAL_CAPSULE | Freq: Two times a day (BID) | ORAL | Status: DC
  Administered 2013-03-19 – 2013-03-27 (×17): 100 mg via ORAL
  Filled 2013-03-19 (×20): qty 1

## 2013-03-19 NOTE — Progress Notes (Signed)
Physical Therapy Session Note  Patient Details  Name: Cassandra Walker MRN: [id number] Date of Birth: 1932-05-07  Today's Date: 03/19/2013 Time: 4098-1191 Time Calculation (min): 60 min  Short Term Goals: Week 1:  PT Short Term Goal 1 (Week 1): Pt will be able to go from propped on elbow <-> sit with mod A to aid with bed mobility and slideboard placement PT Short Term Goal 2 (Week 1): Pt will be able to perform level surface slideboard transfer with max A of 1 PT Short Term Goal 3 (Week 1): Pt will be able to maintain dynamic sitting balance during functional task with up to mod A  PT Short Term Goal 4 (Week 1): Pt will be able to propel w/c x 50' with min A  Skilled Therapeutic Interventions/Progress Updates:   Pt needing encouragement to get OOB stating she is too tired, but with encouragement participated. Initially focused on bed mobility for supine to sit, sitting balance EOB and lateral lean to position slideboard with +2 assist for transfer into w/c. Neuro re-ed in standing frame for postural control , WB through LE's, and upright posture/extension x 2 repetitions; complained of pain in back with standing due to forward lean/kyphotic posture. Manual facilitation and verbal cues needed to reposition. Therapist applied theraband to L rim of w/c to aid with w/c propulsion and pt able to self propel with BUE back to room with mostly S (min A for turn to the R).   Therapy Documentation Precautions:  Precautions Precautions: Cervical Precaution Comments: need to clarify orders with MD if pt has back precautions Required Braces or Orthoses: Other Brace/Splint Other Brace/Splint: Cervical hard collar with thoracic brace component Restrictions Weight Bearing Restrictions: No   Pain:  c/o pain in wrist (R) and back - premedicated.   See FIM for current functional status  Therapy/Group: Individual Therapy and Co-Treatment with Recreational Therapy  [name] [facility] 03/19/2013, 2:55  PM

## 2013-03-19 NOTE — Progress Notes (Signed)
Occupational Therapy Session Note  Patient Details  Name: Cassandra Walker MRN: [id number] Date of Birth: [birth year]  Today's Date: 03/19/2013 Time: 0930-1025 Time Calculation (min): 55 min  Short Term Goals: Week 1:  OT Short Term Goal 1 (Week 1): Patient will hold toothpaste, unscrew toothpaste lid, and put toothpaste onto toothbrush independently OT Short Term Goal 2 (Week 1): Patient will wash hands and wash face holding wash cloth with set-up assist OT Short Term Goal 3 (Week 1): Patient will maintain dynamic sitting balance, for at least 10 minutes, edge of bed with minimal assistance in order to work on ADL retraining OT Short Term Goal 4 (Week 1): BUE HEP will be introduced to patient  OT Short Term Goal 5 (Week 1): Patient will perform self-feeding task with minimal assistance using AE prn  Skilled Therapeutic Interventions/Progress Updates:  Patient found supine in bed with complaints of overall soreness in BUEs, no pain medication needed at this time. Engaged patient in ADL retraining at bed level. Supine for UB/LB bathing and LB dressing. Patient then engaged in bed mobility and sat edge of bed for UB dressing. While seated edge of bed, also focused on dynamic sitting balanace/tolerance/endurance. Patient then transferred edge of bed -> w/c using slide board with total assist X2. Once in w/c worked with patient on re-positioning in w/c and donned bilateral leg rests. From there patient sat at sink for grooming tasks of washing hands, washing face, brushing teeth, and combing hair. During grooming tasks, also focused on functional use of bilateral UEs. At end of session, left patient seated in w/c beside bed with call bell & phone within reach.   Precautions:  Precautions Precautions: Cervical Precaution Comments: need to clarify orders with MD if pt has back precautions Required Braces or Orthoses: Other Brace/Splint Other Brace/Splint: Cervical hard collar with thoracic brace  component Restrictions Weight Bearing Restrictions: No  See FIM for current functional status  Therapy/Group: Individual Therapy  Maxamillian Tienda 03/19/2013, 10:29 AM

## 2013-03-19 NOTE — Progress Notes (Signed)
Physical Therapy Session Note  Patient Details  Name: Cassandra Walker MRN: [id number] Date of Birth: 07/28/1932  Today's Date: 03/19/2013 Time: 4098-1191 Time Calculation (min): 28 min  Short Term Goals: Week 1:  PT Short Term Goal 1 (Week 1): Pt will be able to go from propped on elbow <-> sit with mod A to aid with bed mobility and slideboard placement PT Short Term Goal 2 (Week 1): Pt will be able to perform level surface slideboard transfer with max A of 1 PT Short Term Goal 3 (Week 1): Pt will be able to maintain dynamic sitting balance during functional task with up to mod A  PT Short Term Goal 4 (Week 1): Pt will be able to propel w/c x 50' with min A  Skilled Therapeutic Interventions/Progress Updates:   Session focused on neuro re-ed, strengthening, and stretching/ROM to bilateral LE's for increased functional strength and ROM to aid with overall mobility including ankle pumps, heel slides, hip abduction, SAQ and contract relax stretching to heel cords and hamstrings. Handout given for general strengthening program for family to assist pt during down time.   Therapy Documentation Precautions:  Precautions Precautions: Cervical Precaution Comments: need to clarify orders with MD if pt has back precautions Required Braces or Orthoses: Other Brace/Splint Other Brace/Splint: Cervical hard collar with thoracic brace component Restrictions Weight Bearing Restrictions: No  Pain:  c.o of wrist pain (initially points to R wrist and when asked again a few minutes later reports it's the L).  See FIM for current functional status  Therapy/Group: Individual Therapy  [name] [facility] 03/19/2013, 9:02 AM

## 2013-03-19 NOTE — Progress Notes (Signed)
Subjective/Complaints: "i don't feel good." ---cannot elucidate. Pain 5/5--in no distress A 12 point review of systems has been performed and if not noted above is otherwise negative.   Objective: Vital Signs: Blood pressure 128/60, pulse 71, temperature 97.7 F (36.5 C), temperature source Oral, resp. rate 18, height 5' 5.5" (1.664 m), weight 75.433 kg (166 lb 4.8 oz), SpO2 98.00%. Dg Abd 1 View  03/18/2013   *RADIOLOGY REPORT*  Clinical Data: Diarrhea.  ABDOMEN - 1 VIEW  Comparison: Radiographs dated 01/17/2012  Findings: There is a moderate amount of air in the nondistended large and small bowel.  No appreciable stool in the bowel.  No visible free air or free fluid on these radiographs.  No acute osseous abnormality.  IMPRESSION: No acute abnormality.   Original Report Authenticated By: Francene Boyers, M.D.    Recent Labs  03/18/13 0725 03/19/13 0615  WBC 15.6* 13.8*  HGB 10.6* 11.8*  HCT 32.0* 35.8*  PLT 346 353    Recent Labs  03/18/13 0725 03/19/13 0615  NA 136 135  K 3.8 3.7  CL 96 94*  GLUCOSE 112* 105*  BUN 29* 25*  CREATININE 1.10 0.93  CALCIUM 8.3* 8.7   CBG (last 3)   Recent Labs  03/16/13 2150 03/17/13 0641  GLUCAP 159* 117*    Wt Readings from Last 3 Encounters:  03/17/13 75.433 kg (166 lb 4.8 oz)  03/09/13 76.2 kg (167 lb 15.9 oz)  03/09/13 76.2 kg (167 lb 15.9 oz)    Physical Exam:  Constitutional: She is oriented to person, place, and time. She appears well-developed and well-nourished.  HENT:  Head: Normocephalic and atraumatic.  Right Ear: External ear normal.  Left Ear: External ear normal.  Eyes: Conjunctivae and EOM are normal. Pupils are equal, round, and reactive to light. Right eye exhibits no discharge. Left eye exhibits no discharge. No scleral icterus.  Neck: Normal range of motion. No JVD present. No tracheal deviation present. No thyromegaly present.  Cardiovascular: Normal rate and regular rhythm.  Pulmonary/Chest: Effort  normal and breath sounds normal. No respiratory distress. She has no wheezes.  Abdominal: Soft. She exhibits no distension. Bowel sounds are decreased. There is no tenderness.  Musculoskeletal: She exhibits tenderness. She exhibits no edema.  Bilateral hands with Dupuytren's contractures. Missing 2nd finger of left hand. Pain along neck and into right trapezius.  Lymphadenopathy:  She has no cervical adenopathy.  Neurological: She is alert and oriented to person, place, and time.  Hypersensitivity left shin and left forearm. DTRs diminished. Decreased LT to above chest. .MOTOR TESTING:           Muscle  Right Left  Deltoid  4- 4 Triceps 2+ 2 Biceps  4 4 Wrist ext tr 0 HI  Tr-1 0 HF  2+ tr KE  3- Tr-1 ADF  3-4 1 APF  3-4 1   .  Psychiatric: She has a normal mood and affect. Her behavior is normal. Judgment and thought content normal.   Assessment/Plan: 1. 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 Physiatrist is providing close team supervision and 24 hour management of active medical problems listed below. Physiatrist and rehab team continue to assess barriers to discharge/monitor patient progress toward functional and medical goals. FIM: FIM - Bathing Bathing: 0: Activity did not occur  FIM - Upper Body Dressing/Undressing Upper body dressing/undressing: 0: Wears gown/pajamas-no public clothing FIM -  Lower Body Dressing/Undressing Lower body dressing/undressing: 0: Wears Oceanographer     FIM - Archivist Transfers: 0-Activity did not occur  FIM - Banker Devices: HOB elevated;Bed rails;Sliding board Bed/Chair Transfer: 1: Two helpers  FIM - Locomotion: Wheelchair Locomotion: Wheelchair: 1: Travels less than 50 ft with moderate assistance (Pt: 50 - 74%) FIM - Locomotion:  Ambulation Locomotion: Ambulation: 0: Activity did not occur (unsafe to attempt at eval)  Comprehension Comprehension Mode: Auditory Comprehension: 5-Understands basic 90% of the time/requires cueing < 10% of the time  Expression Expression Mode: Verbal Expression: 5-Expresses basic 90% of the time/requires cueing < 10% of the time.  Social Interaction Social Interaction: 5-Interacts appropriately 90% of the time - Needs monitoring or encouragement for participation or interaction.  Problem Solving Problem Solving: 4-Solves basic 75 - 89% of the time/requires cueing 10 - 24% of the time  Memory Memory: 4-Recognizes or recalls 75 - 89% of the time/requires cueing 10 - 24% of the time Medical Problem List and Plan:  1. DVT Prophylaxis/Anticoagulation: Pharmaceutical: Lovenox  2. Pain Management: Pain remains a limiting factor. Tramadol added yesterday and oxycodone dose increased today.  Marland Kitchen Resumed mobic for chronic pain.   low dose neurontin for neuropathic pain. Scheduled meds with therapies 3. Mood: No signs of distress. Reports generalized pain but attempting to deal with this. Will have LCSW follow for evaluation. Team to provide ego support.  4. Neuropsych: This patient is capable of making decisions on her own behalf.  5. Neurogenic bowel and bladder: continue to check post void residual. Started on urecholine tid today to help with bladder function. Bowel and bladder program for now until we see improvement 6. HTN: monitor with bid checks. Continue Lisinopril, tenormin and HCTZ.  7. Leucocytosis: UA equivocal, culture pending Encourage IS   LOS (Days) 2 A FACE TO FACE EVALUATION WAS PERFORMED  Tyreik Delahoussaye T 03/19/2013 8:15 AM

## 2013-03-19 NOTE — Progress Notes (Signed)
Occupational Therapy Note  Patient Details  Name: KATIYA FIKE MRN: [id number] Date of Birth: 06-26-1932 Today's Date: 03/19/2013  Time: 1300-1330 Pt c/o pain in right lateral wrist (unrated) with supination; RN aware Individual therapy  Pt in bed upon arrival but agreeable to sitting EOB for BUE activites.  Pt required min/mod A for sitting unsupported EOB while engaging in reaching tasks for small pegs to place on peg board.  Pt c/o increased right shoulder with activity.  Pt returned to supine in bed with HOB elevated to continue BUE activities including grasping resistive clothes pins.  Pt stated the pain in shoulder decreased when supported but the pain in her wrist persisted.   Lavone Neri [facility] 03/19/2013, 2:52 PM

## 2013-03-19 NOTE — Plan of Care (Signed)
Overall Plan of Care [facility]) Patient Details Name: Cassandra Walker MRN: [id number] DOB: [birth year]  Diagnosis:  C6-7 fx's with SCI  Co-morbidities: htn, neurogenic bowel/bladder, chronic pain, htn  Functional Problem List  Patient demonstrates impairments in the following areas: Balance, Bladder, Bowel, Edema, Endurance, Medication Management, Motor, Pain, Safety, Sensory  and Skin Integrity  Basic ADL's: eating, grooming, bathing, dressing and toileting Advanced ADL's: simple meal preparation  Transfers:  bed mobility, bed to chair, toilet, tub/shower, car and furniture Locomotion:  ambulation, wheelchair mobility and stairs  Additional Impairments:  Leisure Awareness  Anticipated Outcomes Item Anticipated Outcome  Eating/Swallowing    Basic self-care  Min-Mod Assist  Tolieting  Min-Mod Assist  Bowel/Bladder  Continent with min asist  Transfers  Min A basic; mod A car transfer  Locomotion  S w/c mobility; total A for gait  Communication    Cognition    Pain  Less than 2/10 with min assist  Safety/Judgment    Other     Therapy Plan: PT Intensity: Minimum of 1-2 x/day ,45 to 90 minutes PT Frequency: 5 out of 7 days PT Duration Estimated Length of Stay: 3-3.5 weeks OT Intensity: Minimum of 1-2 x/day, 45 to 90 minutes OT Frequency: 5 out of 7 days OT Duration/Estimated Length of Stay: 21-25 days      Team Interventions: Item RN PT OT SLP SW TR Other  Self Care/Advanced ADL Retraining  x x      Neuromuscular Re-Education  x x      Therapeutic Activities  x x      UE/LE Strength Training/ROM  x x      UE/LE Coordination Activities  x x      Visual/Perceptual Remediation/Compensation         DME/Adaptive Equipment Instruction  x x      Therapeutic Exercise  x x      Balance/Vestibular Training  x x      Patient/Family Education X x x      Cognitive Remediation/Compensation         Functional Mobility Training  x x      Ambulation/Gait Training  x x       Stair Training  x       Wheelchair Propulsion/Positioning  x x      Functional Tourist information centre manager Reintegration  x x      Dysphagia/Aspiration Film/video editor         Bladder Management         Bowel Management         Disease Management/Prevention X x x      Pain Management X x x      Medication Management X        Skin Care/Wound Management X x x      Splinting/Orthotics  x x      Discharge Planning  x x      Psychosocial Support  x x                             Team Discharge Planning: Destination: PT-Home ,OT- Home , SLP-  Projected Follow-up: PT-Home health PT;24 hour supervision/assistance, OT-  Home health OT, SLP-  Projected Equipment Needs: PT-Wheelchair (measurements);Wheelchair cushion (measurements) (other equipment TBD based on pt progress), OT- 3 in 1 bedside comode;Tub/shower bench, SLP-  Patient/family involved  in discharge planning: PT- Patient,  OT-Patient, SLP-   MD ELOS: 3 to 3.5 weeks Medical Rehab Prognosis:  Excellent Assessment: The patient has been admitted for CIR therapies. The team will be addressing, functional mobility, strength, stamina, balance, safety, adaptive techniques/equipment, self-care, bowel and bladder mgt, patient and caregiver education, NMR, pain mgt, orthotics, skin care, . Goals have been set at min to mod assist with basic mobility and self-care.Ranelle Oyster, MD, FAAPMR     See Team Conference Notes for weekly updates to the plan of care

## 2013-03-20 ENCOUNTER — Inpatient Hospital Stay (HOSPITAL_COMMUNITY): Payer: Medicare Other | Admitting: Occupational Therapy

## 2013-03-20 ENCOUNTER — Inpatient Hospital Stay (HOSPITAL_COMMUNITY): Payer: Medicare Other

## 2013-03-20 DIAGNOSIS — K592 Neurogenic bowel, not elsewhere classified: Secondary | ICD-10-CM

## 2013-03-20 DIAGNOSIS — IMO0002 Reserved for concepts with insufficient information to code with codable children: Secondary | ICD-10-CM

## 2013-03-20 DIAGNOSIS — W19XXXA Unspecified fall, initial encounter: Secondary | ICD-10-CM

## 2013-03-20 DIAGNOSIS — N319 Neuromuscular dysfunction of bladder, unspecified: Secondary | ICD-10-CM

## 2013-03-20 MED ORDER — CIPROFLOXACIN HCL 250 MG PO TABS
250.0000 mg | ORAL_TABLET | Freq: Two times a day (BID) | ORAL | Status: AC
  Administered 2013-03-20 – 2013-03-27 (×15): 250 mg via ORAL
  Filled 2013-03-20 (×17): qty 1

## 2013-03-20 NOTE — Progress Notes (Signed)
Occupational Therapy Session Note  Patient Details  Name: Cassandra Walker MRN: [id number] Date of Birth: [birth year]  Today's Date: 03/20/2013 Time: 1300-1400 Time Calculation (min): 60 min  Short Term Goals: Week 1:  OT Short Term Goal 1 (Week 1): Patient will hold toothpaste, unscrew toothpaste lid, and put toothpaste onto toothbrush independently OT Short Term Goal 2 (Week 1): Patient will wash hands and wash face holding wash cloth with set-up assist OT Short Term Goal 3 (Week 1): Patient will maintain dynamic sitting balance, for at least 10 minutes, edge of bed with minimal assistance in order to work on ADL retraining OT Short Term Goal 4 (Week 1): BUE HEP will be introduced to patient  OT Short Term Goal 5 (Week 1): Patient will perform self-feeding task with minimal assistance using AE prn  Skilled Therapeutic Interventions/Progress Updates:  Patient found seated in w/c upon entering room. Patient propelled self from room -> therapy gym with occasional minimal assistance from therapist. Once in gym, patient transferred onto mat using slide board with max assist X2 (2 people for safety). While seated on mat focused skilled intervention on dynamic sitting balance/tolerance/endurance, trigger point -> bilateral shoulders, shoulder retraction for shoulder strengthening, functional use of bilateral hands, and overall activity tolerance/endurance. During activities, while patient was sitting more upright, she had complaints of not being able to swallow/catch her breath; notified RN. ?Brace? Causing patient these problems? Patient transferred back to w/c with max assist X2 using slide board. Patient propelled self -> nurses station, then therapist the rest of the way. Left patient seated in w/c beside bed with call bell & phone within reach.   Precautions:  Precautions Precautions: Cervical Precaution Comments:   Required Braces or Orthoses: Other Brace/Splint Other Brace/Splint: Cervical hard  collar with thoracic brace component Restrictions Weight Bearing Restrictions: No  See FIM for current functional status  Therapy/Group: Individual Therapy  Oleda Borski 03/20/2013, 2:15 PM

## 2013-03-20 NOTE — Progress Notes (Signed)
Physical Therapy Session Note  Patient Details  Name: Cassandra Walker MRN: [id number] Date of Birth: 09/01/1932  Today's Date: 03/20/2013 Time: 1030-1130 Time Calculation (min): 60 min  Short Term Goals: Week 1:  PT Short Term Goal 1 (Week 1): Pt will be able to go from propped on elbow <-> sit with mod A to aid with bed mobility and slideboard placement PT Short Term Goal 2 (Week 1): Pt will be able to perform level surface slideboard transfer with max A of 1 PT Short Term Goal 3 (Week 1): Pt will be able to maintain dynamic sitting balance during functional task with up to mod A  PT Short Term Goal 4 (Week 1): Pt will be able to propel w/c x 50' with min A  Skilled Therapeutic Interventions/Progress Updates:    Session focused on functional bed mobility, sitting balance EOB, slide board transfer OOB to w/c (+2 assist), w/c propulsion and positioning in w/c. Pt with significant posterior lean and difficulty maintaining balance as fatigued requiring up to max A. During transfers and attempts at repositioning in the w/c, pt tends to push posterior and required max verbal cues and up to mod/max A to maintain anterior lean for positioning. Seated w/c activity to reach for rings to place on pole with focus on anterior weightshifts and maintaining balance.   Therapy Documentation Precautions:  Precautions Precautions: Cervical Precaution Comments:   Required Braces or Orthoses: Other Brace/Splint Other Brace/Splint: Cervical hard collar with thoracic brace component Restrictions Weight Bearing Restrictions: No  Pain: Pain Assessment Pain Assessment: 0-10 Pain Score:   7 Pain Type: Acute pain Pain Location: Shoulder Pain Descriptors: Sharp Pain Frequency: Intermittent Pain Onset: Gradual Patients Stated Pain Goal: 2 Pain Intervention(s): Medication (See eMAR)  See FIM for current functional status  Therapy/Group: Individual Therapy  [name] [place] 03/20/2013, 12:11 PM

## 2013-03-20 NOTE — Progress Notes (Signed)
[facility] Individual Statement of Services  Patient Name:  Cassandra Walker  Date:  03/20/2013  Welcome to the [facility]patient [facility].  Our goal is to provide you with an individualized program based on your diagnosis and situation, designed to meet your specific needs.  With this comprehensive rehabilitation program, you will be expected to participate in at least 3 hours of rehabilitation therapies Monday-Friday, with modified therapy programming on the weekends.  Your rehabilitation program will include the following services:  Physical Therapy (PT), Occupational Therapy (OT), 24 hour per day rehabilitation nursing, Therapeutic Recreaction (TR), Case Management (Social Worker), Rehabilitation Medicine, Nutrition Services and Pharmacy Services  Weekly team conferences will be held on Tuesdays to discuss your progress.  Your Social Worker will talk with you frequently to get your input and to update you on team discussions.  Team conferences with you and your family in attendance may also be held.  Expected length of stay: 3 weeks Overall anticipated outcome: minimal assistance  Depending on your progress and recovery, your program may change. Your Social Worker will coordinate services and will keep you informed of any changes. Your Social Worker's name and contact numbers are listed  below.  The following services may also be recommended but are not provided by the [facility]patient [facility]:   Driving Evaluations  Home Health Rehabiltiation Services  Outpatient Rehabilitatation Servives    Arrangements will be made to provide these services after discharge if needed.  Arrangements include referral to agencies that provide these services.  Your insurance has been verified to be:  [place] Your primary doctor is:  Dr. Elisabeth Most  Pertinent information will be shared with your doctor and your insurance company.  Social Worker:  [place], [place]  [phone]  Information discussed with and copy given to patient by: Amada Jupiter, 03/20/2013, 10:13 AM

## 2013-03-20 NOTE — Progress Notes (Signed)
Social Work Assessment and Plan Social Work Assessment and Plan  Patient Details  Name: Cassandra Walker MRN: [id number] Date of Birth: 1932-10-30  Today's Date: 03/20/2013  Problem List:  Patient Active Problem List   Diagnosis Date Noted  . C7 cervical fracture 03/18/2013  . Urinary retention 03/17/2013  . Fall 03/10/2013  . Fracture of C5 vertebra, closed 03/10/2013  . C6 cervical fracture 03/10/2013  . Incomplete quadriplegia at C5-6 level 03/10/2013  . HTN (hypertension) 03/10/2013  . Hyperglycemia 03/10/2013  . Pulmonary nodule 07/24/2011   Past Medical History:  Past Medical History  Diagnosis Date  . Osteoarthritis   . Hyperlipidemia   . GERD (gastroesophageal reflux disease)   . Peripheral neuropathy   . Osteopenia   . DDD (degenerative disc disease)   . Shingles   . Colon cancer 1977  . Thyroid cancer 1966  . Peripheral vascular disease    Past Surgical History:  Past Surgical History  Procedure Laterality Date  . Thyroidectomy    . Total abdominal hysterectomy w/ bilateral salpingoophorectomy  1968  . Colon resection  1977  . Cervical laminectomy  1995  . Cataract extraction      bilateral  . Breast enhancement surgery      silicone  . Breast surgery    . Posterior cervical fusion/foraminotomy N/A 03/07/2013    Procedure: Cervical four -seven POSTERIOR CERVICAL FUSION;  Surgeon: Mariam Dollar, MD;  Location: [facility];  Service: Neurosurgery;  Laterality: N/A;   Social History:  reports that she quit smoking about 40 years ago. She does not have any smokeless tobacco history on file. Her alcohol and drug histories are not on file.  Family / Support Systems Marital Status: Widow/Widower How Long?: 6 yrs Patient Roles: Parent (Has 2 sons and 2 daughters.) Children: daughter, Orie Fisherman;  son, Yesenia Locurto @ [phone] Other Supports: grandaughter, Abigail Miyamoto @ [phone] Anticipated Caregiver: Family and hired caregivers Ability/Limitations  of Caregiver: Family all work but will get assistance for patient. Caregiver Availability: Other (Comment) (Family will procure 24 hr care for patient.) Family Dynamics: grandaughter and others are all very supportive.  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  Admits frustration with limitations but "...already surprised by how much I can do already."  Very postive attitude.  Denies any s/s of depression or anxiety - will monitor. Recent Psychosocial Issues: None Pyschiatric History: none Substance Abuse History: none  Patient / Family Perceptions, Expectations & Goals Pt/Family understanding of illness & functional limitations: Pt and family with basic understanding of pt's SCI and functional limitations/ need for CIR Premorbid pt/family roles/activities: pt very active PTA - no assistance needed from family "for anything" Anticipated changes in roles/activities/participation: may need 24/7 assistance dependent on recovery. Pt/family expectations/goals: Pt states, "I just want to be able to get as good as I can"  [employer]: None Premorbid Home Care/DME Agencies: None Transportation available at discharge: yes  Discharge Planning Living Arrangements: Alone Support Systems: Children;Other relatives;Church/faith community Type of Residence: Private residence Insurance Resources: [employer] Financial Resources: Patent examiner Screen  Referred: No Living Expenses: Own Money Management: Patient Do you have any problems obtaining your medications?: No Home Management: pt PTA Patient/Family Preliminary Plans: Pt plans to return to her own home with family sharing in providing 24/7 assistance Barriers to Discharge: Steps Social Work Anticipated Follow Up Needs: HH/OP Expected length of stay: 3 weeks  Clinical Impression Very pleasant, motivated woman here with grandaughter at bedside and very supportive.  Family able to provide any assistance needed.  No significant emotional distress noted - will monitor.  Phoenyx Melka 03/20/2013, 10:12 AM

## 2013-03-20 NOTE — Progress Notes (Signed)
Occupational Therapy Session Note  Patient Details  Name: Cassandra Walker MRN: [id number] Date of Birth: 1932/07/29  Today's Date: 03/20/2013 Time: 0800-0900 Time Calculation (min): 60 min  Short Term Goals: Week 1:  OT Short Term Goal 1 (Week 1): Patient will hold toothpaste, unscrew toothpaste lid, and put toothpaste onto toothbrush independently OT Short Term Goal 2 (Week 1): Patient will wash hands and wash face holding wash cloth with set-up assist OT Short Term Goal 3 (Week 1): Patient will maintain dynamic sitting balance, for at least 10 minutes, edge of bed with minimal assistance in order to work on ADL retraining OT Short Term Goal 4 (Week 1): BUE HEP will be introduced to patient  OT Short Term Goal 5 (Week 1): Patient will perform self-feeding task with minimal assistance using AE prn  Skilled Therapeutic Interventions/Progress Updates:    Pt in bed eating breakfast upon arrival.  Initial portion of session focused on self feeding without use of adaptive utensils and grooming tasks at bed level.  Pt stated that eating breakfast tired her out.  Pt declined bathing and dressing this morning and requested that nursing assist her preferring a female.  Pt engaged in BUE exercises for shoulder and elbow strength and FM tasks to increase manipulation skills.  Pt stated that with activity her right shoulder became uncomfortable.  Kinesio Tape applied to alleviate pain and patient repositioned in bed.  Pt requires assistance with repositioning with HOB elevated.  Therapy Documentation Precautions:  Precautions Precautions: Cervical Precaution Comments: need to clarify orders with MD if pt has back precautions Required Braces or Orthoses: Other Brace/Splint Other Brace/Splint: Cervical hard collar with thoracic brace component Restrictions Weight Bearing Restrictions: No   Pain: Pain Assessment Pain Assessment: No/denies pain  See FIM for current functional status  Therapy/Group:  Individual Therapy  Rich Brave 03/20/2013, 9:04 AM

## 2013-03-20 NOTE — Progress Notes (Signed)
Subjective/Complaints: Feels fatigued. No distress. Pain under reasonable control A 12 point review of systems has been performed and if not noted above is otherwise negative.   Objective: Vital Signs: Blood pressure 108/67, pulse 62, temperature 97.9 F (36.6 C), temperature source Oral, resp. rate 19, height 5' 5.5" (1.664 m), weight 75.433 kg (166 lb 4.8 oz), SpO2 95.00%. Dg Abd 1 View  03/18/2013   *RADIOLOGY REPORT*  Clinical Data: Diarrhea.  ABDOMEN - 1 VIEW  Comparison: Radiographs dated 01/17/2012  Findings: There is a moderate amount of air in the nondistended large and small bowel.  No appreciable stool in the bowel.  No visible free air or free fluid on these radiographs.  No acute osseous abnormality.  IMPRESSION: No acute abnormality.   Original Report Authenticated By: Francene Boyers, M.D.    Recent Labs  03/18/13 0725 03/19/13 0615  WBC 15.6* 13.8*  HGB 10.6* 11.8*  HCT 32.0* 35.8*  PLT 346 353    Recent Labs  03/18/13 0725 03/19/13 0615  NA 136 135  K 3.8 3.7  CL 96 94*  GLUCOSE 112* 105*  BUN 29* 25*  CREATININE 1.10 0.93  CALCIUM 8.3* 8.7   CBG (last 3)   Recent Labs  03/19/13 1619  GLUCAP 111*    Wt Readings from Last 3 Encounters:  03/17/13 75.433 kg (166 lb 4.8 oz)  03/09/13 76.2 kg (167 lb 15.9 oz)  03/09/13 76.2 kg (167 lb 15.9 oz)    Physical Exam:  Constitutional: She is oriented to person, place, and time. She appears well-developed and well-nourished.  HENT:  Head: Normocephalic and atraumatic.  Right Ear: External ear normal.  Left Ear: External ear normal.  Eyes: Conjunctivae and EOM are normal. Pupils are equal, round, and reactive to light. Right eye exhibits no discharge. Left eye exhibits no discharge. No scleral icterus.  Neck: Normal range of motion. No JVD present. No tracheal deviation present. No thyromegaly present.  Cardiovascular: Normal rate and regular rhythm.  Pulmonary/Chest: Effort normal and breath sounds normal.  No respiratory distress. She has no wheezes.  Abdominal: Soft. She exhibits no distension. Bowel sounds are decreased. There is no tenderness.  Musculoskeletal: She exhibits tenderness. She exhibits no edema.  Bilateral hands with Dupuytren's contractures. Missing 2nd finger of left hand. Pain along neck and into right trapezius.  Lymphadenopathy:  She has no cervical adenopathy.  Neurological: She is alert and oriented to person, place, and time.  Hypersensitivity left shin and left forearm. DTRs diminished. Decreased LT to above chest. MOTOR TESTING:           Muscle  Right Left  Deltoid  4- 4 Triceps 2+ 2 Biceps  4 4 Wrist ext tr 0 HI  Tr-1 0 HF  2+ tr KE  3- Tr-1 ADF  3-4 1 APF  3-4 1   .  Psychiatric: She has a normal mood and affect. Her behavior is normal. Judgment and thought content normal.   Assessment/Plan: 1. 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 Physiatrist is providing close team supervision and 24 hour management of active medical problems listed below. Physiatrist and rehab team continue to assess barriers to discharge/monitor patient progress toward functional and medical goals. FIM: FIM - Bathing Bathing: 1: Total-Patient completes 0-2 of 10 parts or less than 25%  FIM - Upper Body Dressing/Undressing Upper body dressing/undressing: 1: Total-Patient completed less than 25%  of tasks FIM - Lower Body Dressing/Undressing Lower body dressing/undressing: 1: Total-Patient completed less than 25% of tasks  FIM - Toileting Toileting: 0: Activity did not occur  FIM - Archivist Transfers: 0-Activity did not occur  FIM - Banker Devices: Sliding board;Bed rails;HOB elevated;Arm rests Bed/Chair Transfer: 3: Supine > Sit: Mod A (lifting assist/Pt. 50-74%/lift 2 legs;1: Two  helpers  FIM - Locomotion: Wheelchair Locomotion: Wheelchair: 2: Travels 50 - 149 ft with minimal assistance (Pt.>75%) FIM - Locomotion: Ambulation Locomotion: Ambulation: 0: Activity did not occur  Comprehension Comprehension Mode: Auditory Comprehension: 4-Understands basic 75 - 89% of the time/requires cueing 10 - 24% of the time  Expression Expression Mode: Verbal Expression: 5-Expresses basic 90% of the time/requires cueing < 10% of the time.  Social Interaction Social Interaction: 5-Interacts appropriately 90% of the time - Needs monitoring or encouragement for participation or interaction.  Problem Solving Problem Solving: 4-Solves basic 75 - 89% of the time/requires cueing 10 - 24% of the time  Memory Memory: 4-Recognizes or recalls 75 - 89% of the time/requires cueing 10 - 24% of the time Medical Problem List and Plan:  1. DVT Prophylaxis/Anticoagulation: Pharmaceutical: Lovenox  2. Pain Management: Pain remains a limiting factor. Tramadol added yesterday and oxycodone dose increased today.  Marland Kitchen Resumed mobic for chronic pain.   low dose neurontin for neuropathic pain. Scheduled meds with therapies 3. Mood: No signs of distress. Reports generalized pain but attempting to deal with this. egosupport 4. Neuropsych: This patient is capable of making decisions on her own behalf.  5. Neurogenic bowel and bladder: continue to check post void residual. Started on urecholine tid  to help with bladder function. Bowel and bladder program for now until we see improvement 6. HTN: monitor with bid checks. Continue Lisinopril, tenormin and HCTZ.  7. Leucocytosis: UCX with 100k GNR---begin cipro   LOS (Days) 3 A FACE TO FACE EVALUATION WAS PERFORMED  SWARTZ,ZACHARY T 03/20/2013 7:53 AM

## 2013-03-21 ENCOUNTER — Inpatient Hospital Stay (HOSPITAL_COMMUNITY): Payer: Medicare Other | Admitting: *Deleted

## 2013-03-21 ENCOUNTER — Inpatient Hospital Stay (HOSPITAL_COMMUNITY): Payer: Medicare Other | Admitting: Physical Therapy

## 2013-03-21 LAB — URINE CULTURE: Colony Count: >=100000

## 2013-03-21 MED ORDER — ATENOLOL 50 MG PO TABS
50.0000 mg | ORAL_TABLET | Freq: Every day | ORAL | Status: AC
  Administered 2013-03-21: 50 mg via ORAL
  Filled 2013-03-21: qty 1

## 2013-03-21 MED ORDER — ATENOLOL 100 MG PO TABS
100.0000 mg | ORAL_TABLET | Freq: Every day | ORAL | Status: DC
  Administered 2013-03-22 – 2013-04-08 (×17): 100 mg via ORAL
  Filled 2013-03-21 (×20): qty 1

## 2013-03-21 NOTE — Progress Notes (Signed)
Occupational Therapy Session Note  Patient Details  Name: Cassandra Walker MRN: [id number] Date of Birth: 03/02/1932  Today's Date: 03/21/2013 Time:  -   0900-1000  (60 min)  1st session                  1400-1500  (60 min)   2nd session    Short Term Goals: Week 1:  OT Short Term Goal 1 (Week 1): Patient will hold toothpaste, unscrew toothpaste lid, and put toothpaste onto toothbrush independently OT Short Term Goal 2 (Week 1): Patient will wash hands and wash face holding wash cloth with set-up assist OT Short Term Goal 3 (Week 1): Patient will maintain dynamic sitting balance, for at least 10 minutes, edge of bed with minimal assistance in order to work on ADL retraining OT Short Term Goal 4 (Week 1): BUE HEP will be introduced to patient  OT Short Term Goal 5 (Week 1): Patient will perform self-feeding task with minimal assistance using AE prn  Skilled Therapeutic Interventions/Progress Updates:     1st session:   Pt. Lying in bed on bedpan upone OT arrival.  Addressed bed mobility, sitting EOB, transfers, self feeding.  Pt. Rolled to right with max assist and to left with mod assist.  Pt. Able to clean periarea but not thouroughly.  Performeds sidelying to sit with max assist with instructional cues to push up with elbow and hand.  Pt. Sat on EOB for 3 minutes with minimal assist to supervision with dstatic balance and maximum assist with dynamic.  Transferred from bed to wc with total assist +1.  Needed assist to laterally lean and scoot hips backwards.  Pt. Positioned in wc and set up for breakfast.  Pt. Fed self sandwich and drank from cup with minimal assist.      2nd session   1400-1500  (60 min):  Addressed BUE AROM, LUE stronger than RUE.  Did exercises in bed due to pr fatigue.  Pt reports she has been worn out all day and has not had any energy at all.  Performed exercises in supine.  Pt performed 3 sets of 10 repetitions of shoulder flex/ext/abd/ add.  Noted decreased strength in  triceps.  Provided resistance to  LUE in bicep/tricep area.  Exercises to right hand with grip, pinch and release.  Sat on EOB for 5 miniutes with minimal assist  And addressed static balance using BUE for support. Returned to sidelying and positioned on side.      Therapy Documentation Precautions:  Precautions Precautions: Cervical Precaution Comments:   Required Braces or Orthoses: Other Brace/Splint Other Brace/Splint: Cervical hard collar with thoracic brace component Restrictions Weight Bearing Restrictions: No General:   Pain: Pain Assessment Pain Score:   6/10 right UE tingling 1st session                       5/10 Right UE tingling 2nd session         See FIM for current functional status  Therapy/Group: Individual Therapy  Humberto Seals 03/21/2013, 9:32 AM

## 2013-03-21 NOTE — Progress Notes (Signed)
Physical Therapy Session Note  Patient Details  Name: Cassandra Walker MRN: [id number] Date of Birth: 08/27/1932  Today's Date: 03/21/2013 Time: 10:02-11:00 ( ) and 13:00-13:45 ( )   Short Term Goals: Week 1:  PT Short Term Goal 1 (Week 1): Pt will be able to go from propped on elbow <-> sit with mod A to aid with bed mobility and slideboard placement PT Short Term Goal 2 (Week 1): Pt will be able to perform level surface slideboard transfer with max A of 1 PT Short Term Goal 3 (Week 1): Pt will be able to maintain dynamic sitting balance during functional task with up to mod A  PT Short Term Goal 4 (Week 1): Pt will be able to propel w/c x 50' with min A  Skilled Therapeutic Interventions/Progress Updates:   1:2 Tx focused on therex for LE strenghtening and WC mobilty Pt just finished with OT, up in [facility] with some R arm tingling/pain, waiting on pain meds.  Seated therex for LE strengthenign including each of the following bil 2x10. Pt needing AAROM with all LLE flexion motions, but able to perform active PF and hip/knee ext. Encouragement provided to do as much in limited ROM as possible, esp on L. Pt pleased with extension strength on L.  - marching, LAQ, ankel PF/DF, manually resisted hip/knee ext  WC in controlled environment with obstacles and Min A for tight turns. Pt educated on efficiency for turning to L when able due to UE strength favoring.   2:2 Pt very fatigued, resting in bed, stating this is the worst day she's had here, in regards to pain and fatigue. But agreeable to bedside tx.  Supine therex all bil 2x10 with AAROM on L prn.: ankle pumps (resisted PF on L), SAQ, quad sets, glute sets, heel slides, SLR,hip ADD squeeze, and hip ABD/ADD. Cues provided for technique, rest breaks needed 5x84min throughout.  Performed R/L rolling with mod A and cues for efficiency and technique. Supine>R side>sit with Max A for LLE and trunk.   Therapy Documentation Precautions:   Precautions Precautions: Cervical Precaution Comments:   Required Braces or Orthoses: Other Brace/Splint Other Brace/Splint: Cervical hard collar with thoracic brace component Restrictions Weight Bearing Restrictions: No  Pain: AM and PM  6/10 - nursing aware and coming with pain med, modified tx prn'   See FIM for current functional status  Therapy/Group: Individual Therapy  Clydene Laming, PT, DPT  03/21/2013, 10:14 AM

## 2013-03-21 NOTE — Progress Notes (Signed)
Subjective/Complaints: Denies new complaints A 12 point review of systems has been performed and if not noted above is otherwise negative.   Objective: Vital Signs: Blood pressure 113/68, pulse 63, temperature 98 F (36.7 C), temperature source Oral, resp. rate 18, height 5' 5.5" (1.664 m), weight 75.433 kg (166 lb 4.8 oz), SpO2 94.00%. No results found.  Recent Labs  03/19/13 0615  WBC 13.8*  HGB 11.8*  HCT 35.8*  PLT 353    Recent Labs  03/19/13 0615  NA 135  K 3.7  CL 94*  GLUCOSE 105*  BUN 25*  CREATININE 0.93  CALCIUM 8.7   CBG (last 3)   Recent Labs  03/19/13 1619  GLUCAP 111*    Wt Readings from Last 3 Encounters:  03/17/13 75.433 kg (166 lb 4.8 oz)  03/09/13 76.2 kg (167 lb 15.9 oz)  03/09/13 76.2 kg (167 lb 15.9 oz)    Physical Exam:  Constitutional: She is oriented to person, place, and time. She appears well-developed and well-nourished.  HENT:  Head: Normocephalic and atraumatic.  Right Ear: External ear normal.  Left Ear: External ear normal.  Eyes: Conjunctivae and EOM are normal. Pupils are equal, round, and reactive to light. Right eye exhibits no discharge. Left eye exhibits no discharge. No scleral icterus.  Neck: Normal range of motion. No JVD present. No tracheal deviation present. No thyromegaly present.  Cardiovascular: Normal rate and regular rhythm.  Pulmonary/Chest: Effort normal and breath sounds normal. No respiratory distress. She has no wheezes.  Abdominal: Soft. She exhibits no distension. Bowel sounds are decreased. There is no tenderness.  Musculoskeletal: She exhibits tenderness. She exhibits no edema.  Bilateral hands with Dupuytren's contractures. Missing 2nd finger of left hand. Pain along neck and into right trapezius.  Lymphadenopathy:  She has no cervical adenopathy.  Neurological: She is alert and oriented to person, place, and time.  Hypersensitivity left shin and left forearm. DTRs diminished. Decreased LT to above  chest. MOTOR TESTING:           Muscle  Right Left  Deltoid  4- 4 Triceps 2+ 2 Biceps  4 4 Wrist ext tr 0 HI  Tr-1 0 HF  2+ tr KE  3- Tr-1 ADF  3-4 1 APF  3-4 1   .  Psychiatric: She has a normal mood and affect. Her behavior is normal. Judgment and thought content normal.   Assessment/Plan: 1. 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 Physiatrist is providing close team supervision and 24 hour management of active medical problems listed below. Physiatrist and rehab team continue to assess barriers to discharge/monitor patient progress toward functional and medical goals. FIM: FIM - Bathing Bathing: 1: Total-Patient completes 0-2 of 10 parts or less than 25%  FIM - Upper Body Dressing/Undressing Upper body dressing/undressing: 1: Total-Patient completed less than 25% of tasks FIM - Lower Body Dressing/Undressing Lower body dressing/undressing: 1: Total-Patient completed less than 25% of tasks  FIM - Toileting Toileting: 0: Activity did not occur  FIM - Archivist Transfers: 0-Activity did not occur  FIM - Banker Devices: Sliding board;Bed rails;HOB elevated;Arm rests Bed/Chair Transfer: 2: Supine > Sit: Max A (lifting assist/Pt. 25-49%);1: Two helpers  FIM - Locomotion: Wheelchair Locomotion: Wheelchair: 2: Travels 50 - 149 ft with minimal assistance (Pt.>75%) FIM - Locomotion: Ambulation Locomotion: Ambulation: 0: Activity did not occur  Comprehension  Comprehension Mode: Auditory Comprehension: 4-Understands basic 75 - 89% of the time/requires cueing 10 - 24% of the time  Expression Expression Mode: Verbal Expression: 5-Expresses basic 90% of the time/requires cueing < 10% of the time.  Social Interaction Social Interaction: 5-Interacts appropriately 90% of the time - Needs monitoring  or encouragement for participation or interaction.  Problem Solving Problem Solving: 4-Solves basic 75 - 89% of the time/requires cueing 10 - 24% of the time  Memory Memory: 4-Recognizes or recalls 75 - 89% of the time/requires cueing 10 - 24% of the time Medical Problem List and Plan:  1. DVT Prophylaxis/Anticoagulation: Pharmaceutical: Lovenox  2. Pain Management: Pain remains a limiting factor. Tramadol added yesterday and oxycodone dose increased today.  Marland Kitchen Resumed mobic for chronic pain.   low dose neurontin for neuropathic pain. Scheduled meds with therapies 3. Mood: No signs of distress. Reports generalized pain but attempting to deal with this. egosupport 4. Neuropsych: This patient is capable of making decisions on her own behalf.  5. Neurogenic bowel and bladder: continue to check post void residual. Started on urecholine tid  to help with bladder function. Bowel and bladder program for now until we see improvement  -rx UTI 6. HTN: monitor with bid checks. Continue Lisinopril, tenormin and HCTZ.  7. Leucocytosis: UCX with 100k GNR---begin cipro---culture and sens still pending   LOS (Days) 4 A FACE TO FACE EVALUATION WAS PERFORMED  Meyli Boice T 03/21/2013 8:02 AM

## 2013-03-22 ENCOUNTER — Inpatient Hospital Stay (HOSPITAL_COMMUNITY): Payer: Medicare Other | Admitting: *Deleted

## 2013-03-22 LAB — GLUCOSE, CAPILLARY: Glucose-Capillary: 145 mg/dL — ABNORMAL HIGH (ref 70–99)

## 2013-03-22 NOTE — Progress Notes (Signed)
Occupational Therapy Note  Patient Details  Name: Cassandra Walker MRN: [id number] Date of Birth: [birth year] Today's Date: 03/22/2013  Time:  0930-1015  (45 min) Pain:  5/10   Right UE Individual session  Pt. Lying in bed upon OT arrival.  Addressed bed mobility.  Rolled to right with mod assist and to left with max assist and cues to engage hips and shoulders.  Pt. Washed upper thighs with minimal assist. Pt. Slid BLE's off bed with minimal assist.   Sat  EOB with minimal to supervision with BUE support.   She bathed UB with max assist and was positioned back in bed on side due to Bm>  Called nursing to complete peri care.    Humberto Seals 03/22/2013, 9:50 AM

## 2013-03-22 NOTE — Progress Notes (Signed)
No void during the night. I & O cath at 0030=550 and at 0645=500. Supp. Given this AM without results thus far. Patient with congested cough, developed a "few" days ago per patient. Scattered rhonchi throughout. Instructed on using I. S. Bilateral heels red and elevated off bed with pillows. Alfredo Martinez A

## 2013-03-22 NOTE — Progress Notes (Signed)
Subjective/Complaints: Occasional cough. Pain under control. Slept fairly well A 12 point review of systems has been performed and if not noted above is otherwise negative.   Objective: Vital Signs: Blood pressure 106/73, pulse 69, temperature 97.9 F (36.6 C), temperature source Oral, resp. rate 17, height 5' 5.5" (1.664 m), weight 75.433 kg (166 lb 4.8 oz), SpO2 95.00%. No results found. No results found for this basename: WBC, HGB, HCT, PLT,  in the last 72 hours No results found for this basename: NA, K, CL, CO, GLUCOSE, BUN, CREATININE, CALCIUM,  in the last 72 hours CBG (last 3)   Recent Labs  03/19/13 1619  GLUCAP 111*    Wt Readings from Last 3 Encounters:  03/17/13 75.433 kg (166 lb 4.8 oz)  03/09/13 76.2 kg (167 lb 15.9 oz)  03/09/13 76.2 kg (167 lb 15.9 oz)    Physical Exam:  Constitutional: She is oriented to person, place, and time. She appears well-developed and well-nourished.  HENT:  Head: Normocephalic and atraumatic.  Right Ear: External ear normal.  Left Ear: External ear normal.  Eyes: Conjunctivae and EOM are normal. Pupils are equal, round, and reactive to light. Right eye exhibits no discharge. Left eye exhibits no discharge. No scleral icterus.  Neck: Normal range of motion. No JVD present. No tracheal deviation present. No thyromegaly present.  Cardiovascular: Normal rate and regular rhythm.  Pulmonary/Chest: Effort normal and breath sounds normal. No respiratory distress. She has no wheezes.  Abdominal: Soft. She exhibits no distension. Bowel sounds are decreased. There is no tenderness.  Musculoskeletal: She exhibits tenderness. She exhibits no edema.  Bilateral hands with Dupuytren's contractures. Missing 2nd finger of left hand. Pain along neck and into right trapezius.  Lymphadenopathy:  She has no cervical adenopathy.  Neurological: She is alert and oriented to person, place, and time.  Hypersensitivity left shin and left forearm. DTRs  diminished. Decreased LT to above chest. MOTOR TESTING:           Muscle  Right Left  Deltoid  4- 4 Triceps 2+ 2 Biceps  4 4 Wrist ext tr 0 HI  Tr-1 0 HF  2+ tr KE  3- Tr-1 ADF  3-4 1 APF  3-4 1   .  Psychiatric: She has a normal mood and affect. Her behavior is normal. Judgment and thought content normal.   Assessment/Plan: 1. 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 Physiatrist is providing close team supervision and 24 hour management of active medical problems listed below. Physiatrist and rehab team continue to assess barriers to discharge/monitor patient progress toward functional and medical goals. FIM: FIM - Bathing Bathing Steps Patient Completed: Chest;Abdomen Bathing: 1: Total-Patient completes 0-2 of 10 parts or less than 25%  FIM - Upper Body Dressing/Undressing Upper body dressing/undressing: 1: Total-Patient completed less than 25% of tasks FIM - Lower Body Dressing/Undressing Lower body dressing/undressing: 1: Total-Patient completed less than 25% of tasks  FIM - Toileting Toileting: 1: Total-Patient completed zero steps, helper did all 3  FIM - Archivist Transfers: 0-Activity did not occur  FIM - Banker Devices: Sliding board Bed/Chair Transfer: 2: Supine > Sit: Max A (lifting assist/Pt. 25-49%);2: Sit > Supine: Max A (lifting assist/Pt. 25-49%)  FIM - Locomotion: Wheelchair Locomotion: Wheelchair: 2: Travels 50 - 149 ft with minimal assistance (Pt.>75%) FIM - Locomotion: Ambulation Locomotion: Ambulation: 0: Activity did  not occur  Comprehension Comprehension Mode: Auditory Comprehension: 4-Understands basic 75 - 89% of the time/requires cueing 10 - 24% of the time  Expression Expression Mode: Verbal Expression: 5-Expresses basic 90% of the time/requires cueing < 10%  of the time.  Social Interaction Social Interaction: 5-Interacts appropriately 90% of the time - Needs monitoring or encouragement for participation or interaction.  Problem Solving Problem Solving: 4-Solves basic 75 - 89% of the time/requires cueing 10 - 24% of the time  Memory Memory: 4-Recognizes or recalls 75 - 89% of the time/requires cueing 10 - 24% of the time Medical Problem List and Plan:  1. DVT Prophylaxis/Anticoagulation: Pharmaceutical: Lovenox  2. Pain Management: Pain remains a limiting factor. Tramadol added yesterday and oxycodone dose increased today.  Marland Kitchen Resumed mobic for chronic pain.   low dose neurontin for neuropathic pain. Scheduled meds with therapies 3. Mood: No signs of distress. Reports generalized pain but attempting to deal with this. egosupport 4. Neuropsych: This patient is capable of making decisions on her own behalf.  5. Neurogenic bowel and bladder: continue to check post void residual. Started on urecholine tid  to help with bladder function. Bowel and bladder program for now until we see improvement  -rx UTI 6. HTN: monitor with bid checks. Continue Lisinopril, tenormin and HCTZ.  7. Leucocytosis: UCX with 100k citrobacter, sensitive to cipro 8. Cough-  -robitussin, IS, monitor for now  LOS (Days) 5 A FACE TO FACE EVALUATION WAS PERFORMED  Cassandra Walker T 03/22/2013 8:02 AM

## 2013-03-23 ENCOUNTER — Inpatient Hospital Stay (HOSPITAL_COMMUNITY): Payer: Medicare Other

## 2013-03-23 ENCOUNTER — Inpatient Hospital Stay (HOSPITAL_COMMUNITY): Payer: Medicare Other | Admitting: Occupational Therapy

## 2013-03-23 DIAGNOSIS — N319 Neuromuscular dysfunction of bladder, unspecified: Secondary | ICD-10-CM

## 2013-03-23 DIAGNOSIS — W19XXXA Unspecified fall, initial encounter: Secondary | ICD-10-CM

## 2013-03-23 DIAGNOSIS — K592 Neurogenic bowel, not elsewhere classified: Secondary | ICD-10-CM

## 2013-03-23 DIAGNOSIS — IMO0002 Reserved for concepts with insufficient information to code with codable children: Secondary | ICD-10-CM

## 2013-03-23 MED ORDER — LIDOCAINE HCL 2 % EX GEL
CUTANEOUS | Status: DC | PRN
  Filled 2013-03-23: qty 5

## 2013-03-23 NOTE — Progress Notes (Signed)
Subjective/Complaints: No specific complaints. Slept well. Pain controlled at present A 12 point review of systems has been performed and if not noted above is otherwise negative.   Objective: Vital Signs: Blood pressure 111/64, pulse 63, temperature 98 F (36.7 C), temperature source Oral, resp. rate 18, height 5' 5.5" (1.664 m), weight 75.433 kg (166 lb 4.8 oz), SpO2 96.00%. No results found. No results found for this basename: WBC, HGB, HCT, PLT,  in the last 72 hours No results found for this basename: NA, K, CL, CO, GLUCOSE, BUN, CREATININE, CALCIUM,  in the last 72 hours CBG (last 3)   Recent Labs  03/22/13 2035  GLUCAP 145*    Wt Readings from Last 3 Encounters:  03/17/13 75.433 kg (166 lb 4.8 oz)  03/09/13 76.2 kg (167 lb 15.9 oz)  03/09/13 76.2 kg (167 lb 15.9 oz)    Physical Exam:  Constitutional: She is oriented to person, place, and time. She appears well-developed and well-nourished.  HENT:  Head: Normocephalic and atraumatic.  Right Ear: External ear normal.  Left Ear: External ear normal.  Eyes: Conjunctivae and EOM are normal. Pupils are equal, round, and reactive to light. Right eye exhibits no discharge. Left eye exhibits no discharge. No scleral icterus.  Neck: Normal range of motion. No JVD present. No tracheal deviation present. No thyromegaly present.  Cardiovascular: Normal rate and regular rhythm.  Pulmonary/Chest: Effort normal and breath sounds normal. No respiratory distress. She has no wheezes.  Abdominal: Soft. She exhibits no distension. Bowel sounds are decreased. There is no tenderness.  Musculoskeletal: She exhibits tenderness. She exhibits no edema.  Bilateral hands with Dupuytren's contractures. Missing 2nd finger of left hand. Pain along neck and into right trapezius.  Lymphadenopathy:  She has no cervical adenopathy.  Neurological: She is alert and oriented to person, place, and time.  Hypersensitivity left shin and left forearm. DTRs  diminished. Decreased LT to above chest. MOTOR TESTING:           Muscle  Right Left  Deltoid  4- 4 Triceps 2+ 2 Biceps  4 4 Wrist ext 1 0 HI  Tr-1 0 HF  2+ tr KE  3- Tr-1 ADF  3-4 1 APF  3-4 1   .  Psychiatric: She has a normal mood and affect. Her behavior is normal. Judgment and thought content normal.   Assessment/Plan: 1. 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 Physiatrist is providing close team supervision and 24 hour management of active medical problems listed below. Physiatrist and rehab team continue to assess barriers to discharge/monitor patient progress toward functional and medical goals. FIM: FIM - Bathing Bathing Steps Patient Completed: Chest;Abdomen Bathing: 1: Total-Patient completes 0-2 of 10 parts or less than 25%  FIM - Upper Body Dressing/Undressing Upper body dressing/undressing: 1: Total-Patient completed less than 25% of tasks FIM - Lower Body Dressing/Undressing Lower body dressing/undressing: 1: Total-Patient completed less than 25% of tasks  FIM - Toileting Toileting: 1: Total-Patient completed zero steps, helper did all 3  FIM - Archivist Transfers: 0-Activity did not occur  FIM - Banker Devices: Sliding board Bed/Chair Transfer: 2: Supine > Sit: Max A (lifting assist/Pt. 25-49%);2: Sit > Supine: Max A (lifting assist/Pt. 25-49%)  FIM - Locomotion: Wheelchair Locomotion: Wheelchair: 2: Travels 50 - 149 ft with minimal assistance (Pt.>75%) FIM - Locomotion: Ambulation Locomotion: Ambulation: 0: Activity  did not occur  Comprehension Comprehension Mode: Auditory Comprehension: 4-Understands basic 75 - 89% of the time/requires cueing 10 - 24% of the time  Expression Expression Mode: Verbal Expression: 5-Expresses basic needs/ideas: With no assist  Social  Interaction Social Interaction: 5-Interacts appropriately 90% of the time - Needs monitoring or encouragement for participation or interaction.  Problem Solving Problem Solving: 4-Solves basic 75 - 89% of the time/requires cueing 10 - 24% of the time  Memory Memory: 4-Recognizes or recalls 75 - 89% of the time/requires cueing 10 - 24% of the time Medical Problem List and Plan:  1. DVT Prophylaxis/Anticoagulation: Pharmaceutical: Lovenox  2. Pain Management: tramadol, oxy IR, mobic. continue  low dose neurontin for neuropathic pain. Scheduled meds with therapies 3. Mood: No signs of distress. Reports generalized pain but attempting to deal with this. egosupport 4. Neuropsych: This patient is capable of making decisions on her own behalf.  5. Neurogenic bowel and bladder: requiring I and O caths at times.   -continue urecholine  -rx UTI  -am bowel routine 6. HTN: monitor with bid checks. Continue Lisinopril, tenormin and HCTZ.  7. Leucocytosis: UCX with 100k citrobacter, sensitive to cipro 8. Cough-  -robitussin, IS, monitor for now  LOS (Days) 6 A FACE TO FACE EVALUATION WAS PERFORMED  Cassandra Walker T 03/23/2013 8:03 AM

## 2013-03-23 NOTE — Progress Notes (Signed)
Physical Therapy Session Note  Patient Details  Name: Cassandra Walker MRN: [id number] Date of Birth: Feb 15, 1932  Today's Date: 03/23/2013 Time: 1020-1115 Time Calculation (min): 55 min  Short Term Goals: Week 1:  PT Short Term Goal 1 (Week 1): Pt will be able to go from propped on elbow <-> sit with mod A to aid with bed mobility and slideboard placement PT Short Term Goal 2 (Week 1): Pt will be able to perform level surface slideboard transfer with max A of 1 PT Short Term Goal 3 (Week 1): Pt will be able to maintain dynamic sitting balance during functional task with up to mod A  PT Short Term Goal 4 (Week 1): Pt will be able to propel w/c x 50' with min A  Skilled Therapeutic Interventions/Progress Updates:   Session started a few min late due to nursing care (in/out cathing). Pt bridging in supine to assist with pulling pants up and assist to stabilize LLE. Supine to sit with HOB elevated with emphasis on pt doing as much as possible (mod A overall). Focused on functional transfers with slide board with cues to maintain anterior weightshift, using UE's and LE's to help with transfer, and head/hips relationship. Sitting edge of mat worked on static and dynamic sitting balance, body awareness in space, and propped on elbow <-> sitting for transitional movements and to aid with transfers. Required from steady A/close S up to max A for balance activities. W/c propulsion back to pt room with extra time and cues for endurance and UE strengthening.  Therapy Documentation Precautions:  Precautions Precautions: Cervical Precaution Comments:   Required Braces or Orthoses: Other Brace/Splint Other Brace/Splint: Cervical hard collar with thoracic brace component Restrictions Weight Bearing Restrictions: No  Pain:  Premedicated for generalized pain in shoulder and neck.  See FIM for current functional status  Therapy/Group: Individual Therapy  [name] [facility] 03/23/2013, 12:06 PM

## 2013-03-23 NOTE — Progress Notes (Signed)
Physical Therapy Session Note  Patient Details  Name: Cassandra Walker MRN: [id number] Date of Birth: [birth year]  Today's Date: 03/23/2013 Time: 1300-1330 Time Calculation (min): 30 min  Short Term Goals: Week 1:  PT Short Term Goal 1 (Week 1): Pt will be able to go from propped on elbow <-> sit with mod A to aid with bed mobility and slideboard placement PT Short Term Goal 2 (Week 1): Pt will be able to perform level surface slideboard transfer with max A of 1 PT Short Term Goal 3 (Week 1): Pt will be able to maintain dynamic sitting balance during functional task with up to mod A  PT Short Term Goal 4 (Week 1): Pt will be able to propel w/c x 50' with min A  Skilled Therapeutic Interventions/Progress Updates:    Pt with need to have BM; focused session on transfer to [facility] from w/c using slideboard with total A+2 with focus on safe technique, sitting balance while sitting on commode, and lateral leans for clothing management. Nurse tech and RN present to finish transfer back to bed and clothing change.   Therapy Documentation Precautions:  Precautions Precautions: Cervical Precaution Comments:   Required Braces or Orthoses: Other Brace/Splint Other Brace/Splint: Cervical hard collar with thoracic brace component Restrictions Weight Bearing Restrictions: No  Pain: Denies pain.  See FIM for current functional status  Therapy/Group: Individual Therapy  [name] [facility] 03/23/2013, 2:11 PM

## 2013-03-23 NOTE — Evaluation (Signed)
Recreational Therapy Assessment and Plan  Patient Details  Name: Cassandra Walker MRN: [id number] Date of Birth: January 12, 1932 Today's Date: 03/23/2013  Rehab Potential: Good ELOS: 3 weeks   Assessment Clinical Impression: Problem List:  Patient Active Problem List    Diagnosis  Date Noted   .  Urinary retention  03/17/2013   .  Fall  03/10/2013   .  Fracture of C5 vertebra, closed  03/10/2013   .  C6 cervical fracture  03/10/2013   .  Incomplete quadriplegia at C5-6 level  03/10/2013   .  HTN (hypertension)  03/10/2013   .  Hyperglycemia  03/10/2013   .  Pulmonary nodule  07/24/2011    Past Medical History:  Past Medical History   Diagnosis  Date   .  Osteoarthritis    .  Hyperlipidemia    .  GERD (gastroesophageal reflux disease)    .  Peripheral neuropathy    .  Osteopenia    .  DDD (degenerative disc disease)    .  Shingles    .  Colon cancer  1977   .  Thyroid cancer  1966   .  Peripheral vascular disease     Past Surgical History:  Past Surgical History   Procedure  Laterality  Date   .  Thyroidectomy     .  Total abdominal hysterectomy w/ bilateral salpingoophorectomy   1968   .  Colon resection   1977   .  Cervical laminectomy   1995   .  Cataract extraction       bilateral   .  Breast enhancement surgery       silicone   .  Breast surgery     .  Posterior cervical fusion/foraminotomy  N/A  03/07/2013     Procedure: Cervical four -seven POSTERIOR CERVICAL FUSION; Surgeon: Mariam Dollar, MD; Location: [facility]; Service: Neurosurgery; Laterality: N/A;    Clinical Impression: Cassandra Walker is a 79 y.o. female was on a riding lawnmower, put it in drive instead of reverse and ended up hitting a swing set and was launched over the handlebars striking her head and neck. Initially had difficulty moving was admitted on 03/07/13. She was noted be significantly weaker lower extremities as well as and patchy weakness throughout her upper extremities. She had complaints of  severe neck pain with shocking sensations in back and numbness in BUE/BLE. MRI C-spine revealed anterior wedge compression fracture of C7 vertebral body, bilateral C6-C7 facet fractures with jumped C6 facet, relative retrolisthesis of compressed C7 with cord compression and edema. She was taken to OR emergently for ORIF C6-C7 with mass fixation /fusion C4-C7 by Dr. Wynetta Emery and has been on bedrest. Was fitted for thoracic brace with cervical. She has had improvement in strength but limited by LE weakness with pain. Follow up pain films with normal alignment. Continue to require intermittent in and out caths.  Patient transferred to CIR on 03/17/2013.  Pt presents with decreased activity tolerance, decreased functional mobility, decreased balance, decreased functional use of BUE's, decreased coordination Limiting pt's independence with leisure/community pursuits.  Leisure History/Participation Premorbid leisure interest/current participation: Ashby Dawes - Flower gardening;Nature - Vegetable gardening;Crafts - Other (Comment);Community - Press photographer - Physicist, medical Expression Interests: Music (Comment) Other Leisure Interests: Television;Reading;Cooking/Baking;Housework Leisure Participation Style: Alone Biochemist, clinical Resources: Good-identify 3 post discharge leisure resources Psychosocial / Spiritual Spiritual Interests: Church;Womens'Men's Groups Patient agreeable to Pet Therapy: Yes Social interaction - Mood/Behavior: Cooperative Film/video editor  for Education?: Yes Recreational Therapy Orientation Orientation -Reviewed with patient: Available activity resources Strengths/Weaknesses Patient Strengths/Abilities: Willingness to participate Patient weaknesses: Physical limitations  Plan Rec Therapy Plan Is patient appropriate for Therapeutic Recreation?: Yes Rehab Potential: Good Treatment times per week: Min 2 times per week >20 minutes Estimated Length of  Stay: 3 weeks TR Treatment/Interventions: Adaptive equipment instruction;1:1 session;Balance/vestibular training;Community reintegration;Functional mobility training;Patient/family education;Recreation/leisure participation;Therapeutic activities;UE/LE Coordination activities;Therapeutic exercise;Wheelchair propulsion/positioning  Recommendations for other services: None  Discharge Criteria: Patient will be discharged from TR if patient refuses treatment 3 consecutive times without medical reason.  If treatment goals not met, if there is a change in medical status, if patient makes no progress towards goals or if patient is discharged from hospital.  The above assessment, treatment plan, treatment alternatives and goals were discussed and mutually agreed upon: by patient  Abb Gobert 03/23/2013, 11:40 AM

## 2013-03-23 NOTE — Progress Notes (Signed)
Occupational Therapy Session Notes  Patient Details  Name: Cassandra Walker MRN: [id number] Date of Birth: 10-19-1932  Today's Date: 03/23/2013  Short Term Goals: Week 1:  OT Short Term Goal 1 (Week 1): Patient will hold toothpaste, unscrew toothpaste lid, and put toothpaste onto toothbrush independently OT Short Term Goal 2 (Week 1): Patient will wash hands and wash face holding wash cloth with set-up assist OT Short Term Goal 3 (Week 1): Patient will maintain dynamic sitting balance, for at least 10 minutes, edge of bed with minimal assistance in order to work on ADL retraining OT Short Term Goal 4 (Week 1): BUE HEP will be introduced to patient  OT Short Term Goal 5 (Week 1): Patient will perform self-feeding task with minimal assistance using AE prn  Skilled Therapeutic Interventions/Progress Updates:   Session #1 [id number] - 50 Minutes Patient missed 10 minutes secondary to nursing time Individual Therapy No complaints of pain Upon entering room, patient found on bed pan with RN present; missed 10 minutes. Therapist re-entered room at 0840 and engaged patient in LB dressing supine in bed. Patient then engaged in bed mobility and sat edge of bed with maximal assistance. From there, patient transferred edge of bed -> w/c with max assist X2 using slide board. Once in w/c, patient sat at sink for UB bathing & dressing and grooming tasks. Focused skilled intervention on bed mobility, slide board transfer, teach back with slide board transfers, w/c positioning, UB/LB dressing, UB bathing, grooming tasks, functional use of bilateral UEs/hands, and overall activity tolerance/endurance. At end of session, left patient seated in w/c beside bed with call bell & phone within reach.   Session #2 1400-1430 - 30 Minutes Individual Therapy No complaints of pain Patient found supine in bed upon entering room. Therapist assisted patient with bed mobility, patient then transferred edge of bed -> drop arm  BSC with max assist X2 using slide board. Discussed importance of BSC transfers. Patient then transferred back to edge of bed using slide board. Edge of bed -> w/c slide board transfer performed next. Therapist then assisted with re-positioning patient in w/c and left patient seated in w/c with call bell & phone within reach. At this time, patient is unable to direct her own care.   Precautions:  Precautions Precautions: Cervical Precaution Comments:   Required Braces or Orthoses: Other Brace/Splint Other Brace/Splint: Cervical hard collar with thoracic brace component Restrictions Weight Bearing Restrictions: No  See FIM for current functional status  [name] 03/23/2013, 7:30 AM

## 2013-03-24 ENCOUNTER — Inpatient Hospital Stay (HOSPITAL_COMMUNITY): Payer: Medicare Other

## 2013-03-24 ENCOUNTER — Inpatient Hospital Stay (HOSPITAL_COMMUNITY): Payer: Medicare Other | Admitting: Occupational Therapy

## 2013-03-24 ENCOUNTER — Encounter (HOSPITAL_COMMUNITY): Payer: Medicare Other | Admitting: Occupational Therapy

## 2013-03-24 LAB — CREATININE, SERUM
GFR calc Af Amer: 51 mL/min — ABNORMAL LOW (ref >90)
GFR calc non Af Amer: 44 mL/min — ABNORMAL LOW (ref >90)

## 2013-03-24 MED ORDER — GUAIFENESIN ER 600 MG PO TB12
600.0000 mg | ORAL_TABLET | Freq: Two times a day (BID) | ORAL | Status: DC
  Administered 2013-03-24 – 2013-04-09 (×33): 600 mg via ORAL
  Filled 2013-03-24 (×35): qty 1

## 2013-03-24 MED ORDER — SACCHAROMYCES BOULARDII 250 MG PO CAPS
250.0000 mg | ORAL_CAPSULE | Freq: Two times a day (BID) | ORAL | Status: DC
  Administered 2013-03-24 – 2013-04-09 (×33): 250 mg via ORAL
  Filled 2013-03-24 (×35): qty 1

## 2013-03-24 NOTE — Progress Notes (Signed)
Occupational Therapy Session Notes   Patient Details  Name: Cassandra Walker MRN: [id number] Date of Birth: 1931-12-08  Today's Date: 03/24/2013  Short Term Goals: Week 1:  OT Short Term Goal 1 (Week 1): Patient will hold toothpaste, unscrew toothpaste lid, and put toothpaste onto toothbrush independently OT Short Term Goal 2 (Week 1): Patient will wash hands and wash face holding wash cloth with set-up assist OT Short Term Goal 3 (Week 1): Patient will maintain dynamic sitting balance, for at least 10 minutes, edge of bed with minimal assistance in order to work on ADL retraining OT Short Term Goal 4 (Week 1): BUE HEP will be introduced to patient  OT Short Term Goal 5 (Week 1): Patient will perform self-feeding task with minimal assistance using AE prn  Skilled Therapeutic Interventions/Progress Updates:   Session #1 0830-1000 - 90 Minutes Individual Therapy No complaints of pain Patient found supine in bed. Re-positioned patient in bed for self-feeding activity. Administered blue mug to help increase independence with patient managing cup and encouraged patient to use red foam in order to build up handle on utensil. Patient required occasional hand-over-hand assistance; noticed some ataxic movements during self-feeding. RN then administered medications, during medication management therapist worked with patient on managing cup for drinking. After self feeding, patient engaged in bed mobility for LB bathing & dressing. Afterwards, patient sat edge of bed for UB bathing & dressing. After bathing and dressing activity, patient performed slide board transfer with total assist X2 from edge of bed -> w/c. Therapist assisted patient with re-positioning in w/c and w/c management (adjusting leg rests), then patient sat at sink for grooming tasks. At end of session, left patient seated in w/c beside bed with call bell & phone within reach.   Session #2 [id number] - 45 Minutes Individual Therapy No  complaints of pain Patient found seated in w/c. Therapist propelled patient from room -> therapy gym. Once in gym, patient transferred w/c -> edge of mat with total assist X2 using slide board. While seated edge of mat focused skilled intervention on dynamic sitting balance/tolerance/endurance, functional use of bilateral UEs, shoulder retraction, trigger point -> traps, BUE HEP, and overall activity tolerance/endurance. Administered tan theraputty for HEP as well as wrist weights for patient to hold in hands. Patient transferred back into w/c at end of session using slide board with total assist X2. Therapist propelled patient back to room and left patient seated beside bed in w/c with call bell & phone within reach.   Precautions:  Precautions Precautions: Cervical Precaution Comments:   Required Braces or Orthoses: Other Brace/Splint Other Brace/Splint: Cervical hard collar with thoracic brace component Restrictions Weight Bearing Restrictions: No  See FIM for current functional status  Shenee Wignall 03/24/2013, 7:25 AM

## 2013-03-24 NOTE — Plan of Care (Signed)
Problem: SCI BOWEL ELIMINATION Goal: RH STG MANAGE BOWEL WITH ASSISTANCE STG Manage Bowel with min Assistance.  Outcome: Not Progressing Max assist  Problem: RH SKIN INTEGRITY Goal: RH STG SKIN FREE OF INFECTION/BREAKDOWN Skin Free of infection/ breakdown with Mod I .  Outcome: Not Progressing Max assist Goal: RH STG MAINTAIN SKIN INTEGRITY WITH ASSISTANCE STG Maintain Skin Integrity With mod I Assistance.  Outcome: Not Progressing Max assist

## 2013-03-24 NOTE — Progress Notes (Signed)
Physical Therapy Session Note  Patient Details  Name: Cassandra Walker MRN: [id number] Date of Birth: May 17, 1932  Today's Date: 03/24/2013 Time: 1030-1128 Time Calculation (min): 58 min  Short Term Goals: Week 1:  PT Short Term Goal 1 (Week 1): Pt will be able to go from propped on elbow <-> sit with mod A to aid with bed mobility and slideboard placement PT Short Term Goal 2 (Week 1): Pt will be able to perform level surface slideboard transfer with max A of 1 PT Short Term Goal 3 (Week 1): Pt will be able to maintain dynamic sitting balance during functional task with up to mod A  PT Short Term Goal 4 (Week 1): Pt will be able to propel w/c x 50' with min A  Skilled Therapeutic Interventions/Progress Updates:    Pt up in w/c; focused on w/c propulsion to therapy gym with up to min A needed (due to not having theraband on L rim to increase grip strength due to w/c changed due to size). Transfers to/from mat with up to max A and second person to stabilize and help with scooting back on the mat or w/c using slideboard and cues for weightshift anterior and use of UE's/LEs to aid with transfer. Neuro re-ed for trunk control seated edge of mat to find static balance and progressed to dynamic activity to reach for rings to promote anterior weightshift and to work on balance with min A at times mod/max A when LOB posterior occurs. Pt reports need to have BM; transferred with total A +2 back to w/c and returned to room. Transferred with +2 assist onto [facility], but pt stated she had already gone. Transferred back to bed from [facility] for easier clothing management/hygiene with emphasis on pt rolling with mod A. Then transferred back to w/c with max A +2 using slideboard into w/c.  Therapy Documentation Precautions:  Precautions Precautions: Cervical Precaution Comments:   Required Braces or Orthoses: Other Brace/Splint Other Brace/Splint: Cervical hard collar with thoracic brace component Restrictions Weight  Bearing Restrictions: No  Pain:  Reports pain is better today - premedicated.  See FIM for current functional status  Therapy/Group: Individual Therapy  [name] [employer] 03/24/2013, 12:18 PM

## 2013-03-24 NOTE — Progress Notes (Addendum)
Subjective/Complaints: Semi-productive cough, no fever or chills.  A 12 point review of systems has been performed and if not noted above is otherwise negative.   Objective: Vital Signs: Blood pressure 119/65, pulse 62, temperature 98.2 F (36.8 C), temperature source Oral, resp. rate 18, height 5' 5.5" (1.664 m), weight 75.433 kg (166 lb 4.8 oz), SpO2 95.00%. No results found. No results found for this basename: WBC, HGB, HCT, PLT,  in the last 72 hours  Recent Labs  03/24/13 0605  CREATININE 1.15*   CBG (last 3)   Recent Labs  03/22/13 2035  GLUCAP 145*    Wt Readings from Last 3 Encounters:  03/17/13 75.433 kg (166 lb 4.8 oz)  03/09/13 76.2 kg (167 lb 15.9 oz)  03/09/13 76.2 kg (167 lb 15.9 oz)    Physical Exam:  Constitutional: She is oriented to person, place, and time. She appears well-developed and well-nourished.  HENT:  Head: Normocephalic and atraumatic.  Right Ear: External ear normal.  Left Ear: External ear normal.  Eyes: Conjunctivae and EOM are normal. Pupils are equal, round, and reactive to light. Right eye exhibits no discharge. Left eye exhibits no discharge. No scleral icterus.  Neck: Normal range of motion. No JVD present. No tracheal deviation present. No thyromegaly present.  Cardiovascular: Normal rate and regular rhythm.  Pulmonary/Chest: Effort normal and breath sounds normal. No respiratory distress. She has no wheezes.  Abdominal: Soft. She exhibits no distension. Bowel sounds are decreased. There is no tenderness.  Musculoskeletal: She exhibits tenderness. She exhibits no edema.  Bilateral hands with Dupuytren's contractures. Missing 2nd finger of left hand. Pain along neck and into right trapezius.  Lymphadenopathy:  She has no cervical adenopathy.  Neurological: She is alert and oriented to person, place, and time.  Hypersensitivity left shin and left forearm. DTRs diminished. Decreased LT to above chest. MOTOR TESTING:            Muscle  Right Left  Deltoid  4- 4 Triceps 2+ 2 Biceps  4 4 Wrist ext 1+ 1+ HI  Tr-1 0 HF  2+ tr KE  3- Tr-1 ADF  3-4 1 APF  3-4 1   .  Psychiatric: She has a normal mood and affect. Her behavior is normal. Judgment and thought content normal.   Assessment/Plan: 1. 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 Physiatrist is providing close team supervision and 24 hour management of active medical problems listed below. Physiatrist and rehab team continue to assess barriers to discharge/monitor patient progress toward functional and medical goals. FIM: FIM - Bathing Bathing Steps Patient Completed: Chest;Abdomen Bathing: 1: Total-Patient completes 0-2 of 10 parts or less than 25%  FIM - Upper Body Dressing/Undressing Upper body dressing/undressing steps patient completed: Thread/unthread right sleeve of pullover shirt/dresss;Thread/unthread left sleeve of pullover shirt/dress Upper body dressing/undressing: 3: Mod-Patient completed 50-74% of tasks FIM - Lower Body Dressing/Undressing Lower body dressing/undressing: 1: Two helpers  FIM - Toileting Toileting: 1: Total-Patient completed zero steps, helper did all 3  FIM - Diplomatic Services operational officer Devices: Sliding board;Bedside commode Toilet Transfers: 0-Activity did not occur  FIM - Banker Devices: Sliding board;Bed rails;HOB elevated Bed/Chair Transfer: 3: Supine > Sit: Mod A (lifting assist/Pt. 50-74%/lift 2 legs;3: Sit > Supine: Mod A (lifting assist/Pt. 50-74%/lift 2 legs);3: Bed > Chair or W/C: Mod A (lift or lower assist);3: Chair or  W/C > Bed: Mod A (lift or lower assist)  FIM - Locomotion: Wheelchair Locomotion: Wheelchair: 2: Travels 50 - 149 ft with supervision, cueing or coaxing FIM - Locomotion: Ambulation Locomotion: Ambulation: 0:  Activity did not occur  Comprehension Comprehension Mode: Auditory Comprehension: 4-Understands basic 75 - 89% of the time/requires cueing 10 - 24% of the time  Expression Expression Mode: Verbal Expression: 3-Expresses basic 50 - 74% of the time/requires cueing 25 - 50% of the time. Needs to repeat parts of sentences.  Social Interaction Social Interaction: 5-Interacts appropriately 90% of the time - Needs monitoring or encouragement for participation or interaction.  Problem Solving Problem Solving: 3-Solves basic 50 - 74% of the time/requires cueing 25 - 49% of the time  Memory Memory: 4-Recognizes or recalls 75 - 89% of the time/requires cueing 10 - 24% of the time Medical Problem List and Plan:  1. DVT Prophylaxis/Anticoagulation: Pharmaceutical: Lovenox  2. Pain Management: tramadol, oxy IR, mobic. continue  low dose neurontin for neuropathic pain. Scheduled meds with therapies 3. Mood: No signs of distress. Reports generalized pain but attempting to deal with this. egosupport 4. Neuropsych: This patient is capable of making decisions on her own behalf.  5. Neurogenic bowel and bladder: requiring I and O caths    -continue urecholine-(requiring caths anyway)  -rx UTI  -am bowel routine, dc miralax 6. HTN: monitor with bid checks. Continue Lisinopril, tenormin and HCTZ.  7. Leucocytosis: UCX with 100k citrobacter, sensitive to cipro 8. Cough-  -scheduled mucinex, IS, monitor for now  LOS (Days) 7 A FACE TO FACE EVALUATION WAS PERFORMED  Waymond Meador T 03/24/2013 7:34 AM

## 2013-03-25 ENCOUNTER — Inpatient Hospital Stay (HOSPITAL_COMMUNITY): Payer: Medicare Other

## 2013-03-25 ENCOUNTER — Inpatient Hospital Stay (HOSPITAL_COMMUNITY): Payer: Medicare Other | Admitting: Occupational Therapy

## 2013-03-25 MED ORDER — MELOXICAM 15 MG PO TABS
15.0000 mg | ORAL_TABLET | Freq: Every day | ORAL | Status: DC
  Administered 2013-03-25 – 2013-04-03 (×10): 15 mg via ORAL
  Filled 2013-03-25 (×12): qty 1

## 2013-03-25 NOTE — Progress Notes (Signed)
Physical Therapy Weekly Progress Note  Patient Details  Name: Cassandra Walker MRN: [id number] Date of Birth: 05-31-1932  Today's Date: 03/25/2013  Patient has met 3 of 4 short term goals. Pt has made minimal and very slow gains since admission to [facility]. Pain and fatigue continue to limit pt's participation with therapies. For this reason, pt is on modified schedule of 3 hours of therapy per day instead of 4 hours per day. Pt continues to require total A +2 for basic transfers using slideboard and min to mod A for static sitting balance. Pt is max A for repositioning in w/c. Unclear what physical assist will be available at home for d/c and needs to be clarified with family as pt is unreliable in report. At this time, may recommend SNF from CIR for further therapy unless pt makes significant progress/functional gains. Tentative d/c set for 6/6 with goals at overall min to mod A w/c level.     Patient continues to demonstrate the following deficits: quadriplegia, decreased balance, decreased activity tolerance, impaired memory/carryover of new information, decreased strength, decreased functional mobility and therefore will continue to benefit from skilled PT intervention to enhance overall performance with activity tolerance, balance, postural control, ability to compensate for deficits and functional use of  right upper extremity, right lower extremity, left upper extremity and left lower extremity.  Patient slowly progressing toward long term goals..  Continue plan of care.  PT Short Term Goals Week 1:  PT Short Term Goal 1 (Week 1): Pt will be able to go from propped on elbow <-> sit with mod A to aid with bed mobility and slideboard placement PT Short Term Goal 1 - Progress (Week 1): Met PT Short Term Goal 2 (Week 1): Pt will be able to perform level surface slideboard transfer with max A of 1 PT Short Term Goal 2 - Progress (Week 1): Progressing toward goal PT Short Term Goal 3 (Week 1): Pt will  be able to maintain dynamic sitting balance during functional task with up to mod A  PT Short Term Goal 3 - Progress (Week 1): Met PT Short Term Goal 4 (Week 1): Pt will be able to propel w/c x 50' with min A PT Short Term Goal 4 - Progress (Week 1): Met Week 2:  PT Short Term Goal 1 (Week 2): Pt will be able to complete basic level slide board transfer bed <-> w/c with max  A of 1 PT Short Term Goal 2 (Week 2): Pt will be able to reposition in w/c with min A (scoot hip back after transfer) PT Short Term Goal 3 (Week 2): Pt will be able to demonstrate dynamic sitting balance for functional tasks with min A  Skilled Therapeutic Interventions/Progress Updates:  Ambulation/gait training;Balance/vestibular training;Community reintegration;Discharge planning;Disease management/prevention;DME/adaptive equipment instruction;Functional mobility training;Neuromuscular re-education;Pain management;Patient/family education;Psychosocial support;Skin care/wound management;Splinting/orthotics;Stair training;Therapeutic Activities;Therapeutic Exercise;UE/LE Strength taining/ROM;UE/LE Coordination activities;Wheelchair propulsion/positioning;Cognitive remediation/compensation   Therapy Documentation Precautions:  Precautions Precautions: Cervical Precaution Comments:   Required Braces or Orthoses: Other Brace/Splint Other Brace/Splint: Cervical hard collar with thoracic brace component Restrictions Weight Bearing Restrictions: No  See FIM for current functional status    Tedd Sias 03/25/2013, 4:31 PM

## 2013-03-25 NOTE — Progress Notes (Signed)
Inpatient RehabilitationTeam Conference and Plan of Care Update Date: 03/24/2013   Time: 2:37 PM    Patient Name: Cassandra Walker      Medical Record Number: [id number]  Date of Birth: 27-Dec-1931 Sex: Female         Room/Bed: 4006/4006-01 Payor Info: Payor: Advertising copywriter MEDICARE / Plan: AARP MEDICARE COMPLETE / Product Type: *No Product type* /    Admitting Diagnosis: traumatic SCI  Admit Date/Time:  03/17/2013  6:07 PM Admission Comments: No comment available   Primary Diagnosis:  C7 cervical fracture Principal Problem: C7 cervical fracture  Patient Active Problem List   Diagnosis Date Noted  . C7 cervical fracture 03/18/2013  . Urinary retention 03/17/2013  . Fall 03/10/2013  . Fracture of C5 vertebra, closed 03/10/2013  . C6 cervical fracture 03/10/2013  . Incomplete quadriplegia at C5-6 level 03/10/2013  . HTN (hypertension) 03/10/2013  . Hyperglycemia 03/10/2013  . Pulmonary nodule 07/24/2011    Expected Discharge Date:  04/10/13  Team Members Present:  Dr. Riley Kill, Dossie Der SW, Melanee Spry RN, Carmie End RN, Feliberto Gottron ST, [name] PT, Edwin Cap OT, Mackie Pai Supervisor, Bayard Hugger Director, Tora Duck PPS Coordinator.       Current Status/Progress Goal Weekly Team Focus  Medical   cervical 6-7 fx with central cord syndrome. neurogenic bowlel and bladder  increase upper ext strength. stabilze medicaly for dc  bowel program, skin care, pain mgt   Bowel/Bladder   incontinent bowel dulcolax suppository given every am at 0600 no void requires in and out caths every 8 hours   max asssist   educate family on bowel and bladder management    Swallow/Nutrition/ Hydration             ADL's   overall total assist -> total assist X2 for ADL and transfers; set-up-> min assist for grooming tasks, min assist for self-feeding  min-mod assist except mod I for self-feeding & grooming tasks  ADL retraining, bed mobility, functional transfers, functional use of  BUEs, overall activity tolerance/endurance   Mobility   mod/max for bed mobility; max to total A for slideboard transfers, s/min A w/c propulsion  min A w/c level overall  transfers, sitting balance, funcitonal strengthening, activity tolerance   Communication             Safety/Cognition/ Behavioral Observations            Pain   scheduled oxycodone 10mg  po and ultram 50 mg po scheduled   less than 3  monitor effectiveness of pain meds   Skin   bruising to extremities   no new breakdown   monitor skin around c- collar and extremities      *See Care Plan and progress notes for long and short-term goals.  Barriers to Discharge: profound neurological deficits    Possible Resolutions to Barriers:  family ed, NMR    Discharge Planning/Teaching Needs:  Home with family to provide 24 hr care.      Team Discussion:  Min A-Mod A goals  Hands not improving  Need to begin family ed  Revisions to Treatment Plan:  Some downgrading of goals   Continued Need for Acute Rehabilitation Level of Care: The patient requires daily medical management by a physician with specialized training in physical medicine and rehabilitation for the following conditions: Daily direction of a multidisciplinary physical rehabilitation program to ensure safe treatment while eliciting the highest outcome that is of practical value to the patient.: Yes Daily medical  management of patient stability for increased activity during participation in an intensive rehabilitation regime.: Yes Daily analysis of laboratory values and/or radiology reports with any subsequent need for medication adjustment of medical intervention for : Neurological problems;Post surgical problems;Other  Brock Ra 03/25/2013, 4:38 PM

## 2013-03-25 NOTE — Progress Notes (Signed)
Physical Therapy Note  Patient Details  Name: Cassandra Walker MRN: [id number] Date of Birth: 1932-03-30 Today's Date: 03/25/2013  Pt missed 60 min of skilled PT due to refusal. Pt reports being cold, tired, and just "not feeling well." Encouraged pt to do therapy in the room or asked if there was anything she felt up to trying, but pt declined. Left in bed with all needs in place.    [name] [facility] 03/25/2013, 2:06 PM

## 2013-03-25 NOTE — Progress Notes (Deleted)
Occupational Therapy Note  Patient Details  Name: Cassandra Walker MRN: [id number] Date of Birth: [birth year] Today's Date: 03/25/2013  Time: 10:30-11am ( .) Pt seen for 1:1 OT session focusing on dynamic sitting balance and FMC and strength of R hand. With pt sitting in chair, pt reached in all planes to pick up rings including on floor. Verbal cues for pt to open up all digits to grasp and release rings. Worked on strengthening and [facility] activities with R hand and super soft (beige) thera-putty. Showed pt and dtr several activities and exercises to complete with R hand with pt returning demo. Dtr mentioned pt sometimes is exhibiting R inattention during eating. Recommended placing food tray off to R, so pt is forced to look R and also to use some verbal cueing. During session, pt using 1 word answers such as "yeah," "maybe." Pt answering questions appropriately during session. No pain reported.    Tilda Samudio Hessie Diener 03/25/2013, 11:50 AM

## 2013-03-25 NOTE — Progress Notes (Signed)
Subjective/Complaints: Woke up with left shoulder hurting. Didn't bother her yesterday A 12 point review of systems has been performed and if not noted above is otherwise negative.   Objective: Vital Signs: Blood pressure 94/54, pulse 69, temperature 98.3 F (36.8 C), temperature source Oral, resp. rate 18, height 5' 5.5" (1.664 m), weight 73.9 kg (162 lb 14.7 oz), SpO2 95.00%. No results found. No results found for this basename: WBC, HGB, HCT, PLT,  in the last 72 hours  Recent Labs  03/24/13 0605  CREATININE 1.15*   CBG (last 3)   Recent Labs  03/22/13 2035  GLUCAP 145*    Wt Readings from Last 3 Encounters:  03/24/13 73.9 kg (162 lb 14.7 oz)  03/09/13 76.2 kg (167 lb 15.9 oz)  03/09/13 76.2 kg (167 lb 15.9 oz)    Physical Exam:  Constitutional: She is oriented to person, place, and time. She appears well-developed and well-nourished.  HENT:  Head: Normocephalic and atraumatic.  Right Ear: External ear normal.  Left Ear: External ear normal.  Eyes: Conjunctivae and EOM are normal. Pupils are equal, round, and reactive to light. Right eye exhibits no discharge. Left eye exhibits no discharge. No scleral icterus.  Neck: Normal range of motion. No JVD present. No tracheal deviation present. No thyromegaly present.  Cardiovascular: Normal rate and regular rhythm.  Pulmonary/Chest: Effort normal and breath sounds normal. No respiratory distress. She has no wheezes.  Abdominal: Soft. She exhibits no distension. Bowel sounds are decreased. There is no tenderness.  Musculoskeletal: She exhibits tenderness. She exhibits no edema. Left shoulder tenderness fairly non-specific. More tender with ext rotation than internal. Pain along subacromial space perhaps a little more than other areas. Bilateral hands with Dupuytren's contractures. Missing 2nd finger of left hand. Pain along neck and into right trapezius.  Lymphadenopathy:  She has no cervical adenopathy.  Neurological: She  is alert and oriented to person, place, and time.  Hypersensitivity left shin and left forearm. DTRs diminished. Decreased LT to above chest. MOTOR TESTING:           Muscle  Right Left  Deltoid  4- 4 Triceps 2+ 2 Biceps  4 4 Wrist ext 1+ 1+ HI  Tr-1 0 HF  2+ tr KE  3- Tr-1 ADF  3-4 1 APF  3-4 1   .  Psychiatric: She has a normal mood and affect. Her behavior is normal. Judgment and thought content normal.   Assessment/Plan: 1. 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 Physiatrist is providing close team supervision and 24 hour management of active medical problems listed below. Physiatrist and rehab team continue to assess barriers to discharge/monitor patient progress toward functional and medical goals. FIM: FIM - Bathing Bathing Steps Patient Completed: Chest;Right Arm;Left Arm Bathing: 2: Max-Patient completes 3-4 14f 10 parts or 25-49% (with min assist for sitting balance edge of bed)  FIM - Upper Body Dressing/Undressing Upper body dressing/undressing steps patient completed: Thread/unthread right sleeve of pullover shirt/dresss;Thread/unthread left sleeve of pullover shirt/dress Upper body dressing/undressing: 3: Mod-Patient completed 50-74% of tasks FIM - Lower Body Dressing/Undressing Lower body dressing/undressing: 1: Two helpers  FIM - Toileting Toileting: 0: Activity did not occur  FIM - Diplomatic Services operational officer Devices: Sliding board;Bedside commode Toilet Transfers: 0-Activity did not occur  FIM - Banker Devices: Sliding board;Bed rails;HOB elevated Bed/Chair Transfer: 3: Supine > Sit:  Mod A (lifting assist/Pt. 50-74%/lift 2 legs;2: Sit > Supine: Max A (lifting assist/Pt. 25-49%);2: Supine > Sit: Max A (lifting assist/Pt. 25-49%);2: Bed > Chair or W/C: Max A (lift and lower  assist);2: Chair or W/C > Bed: Max A (lift and lower assist)  FIM - Locomotion: Wheelchair Locomotion: Wheelchair: 2: Travels 50 - 149 ft with minimal assistance (Pt.>75%) FIM - Locomotion: Ambulation Locomotion: Ambulation: 0: Activity did not occur  Comprehension Comprehension Mode: Auditory Comprehension: 5-Understands basic 90% of the time/requires cueing < 10% of the time  Expression Expression Mode: Verbal Expression: 5-Expresses basic needs/ideas: With no assist  Social Interaction Social Interaction: 6-Interacts appropriately with others with medication or extra time (anti-anxiety, antidepressant).  Problem Solving Problem Solving: 3-Solves basic 50 - 74% of the time/requires cueing 25 - 49% of the time  Memory Memory: 5-Requires cues to use assistive device Medical Problem List and Plan:  1. DVT Prophylaxis/Anticoagulation: Pharmaceutical: Lovenox  2. Pain Management: tramadol, oxy IR, mobic. continue  low dose neurontin for neuropathic pain. Scheduled meds with therapies  -ice, increased mobic for shoulder pain, (mild bursitis) 3. Mood: No signs of distress. Reports generalized pain but attempting to deal with this. egosupport 4. Neuropsych: This patient is capable of making decisions on her own behalf.  5. Neurogenic bowel and bladder: requiring I and O caths    -continue urecholine-(requiring caths anyway)  -rx UTI  -am bowel routine, off miralax 6. HTN: monitor with bid checks. Continue Lisinopril, tenormin and HCTZ.  7. Leucocytosis: UCX with 100k citrobacter, sensitive to cipro 8. Cough-  -scheduled mucinex, IS, monitor for now  LOS (Days) 8 A FACE TO FACE EVALUATION WAS PERFORMED  Shadman Tozzi T 03/25/2013 7:35 AM

## 2013-03-25 NOTE — Progress Notes (Signed)
Social Work Patient ID: Cassandra Walker, female   DOB: 03-Dec-1931, 79 y.o.   MRN: [id number] Spoke with pt and granddaughter-Cassandra Walker to inform team conference goals-min/mod level and discharge 6/6.  Family is discussing all of their options And trying to see what needs to be done at pt's home.  They would like a home evaluation to see if pt's home is feasible to have her come home in a wheelchair. Informed her would let team know of their request.  Has asked for agency list for hired assistance and will pursue.  Question also if pt would be better in a NH for a short time then Return home.  Will ask team to discuss with granddaugther and see if home eval feasible and talk through the concerns of family.  Family to discuss what they can do to assist pt.

## 2013-03-25 NOTE — Progress Notes (Signed)
Occupational Therapy Session Notes  Patient Details  Name: [name] MRN: [id number] Date of Birth: 15-Mar-1932  Today's Date: 03/25/2013  Short Term Goals: Week 1:  OT Short Term Goal 1 (Week 1): Patient will hold toothpaste, unscrew toothpaste lid, and put toothpaste onto toothbrush independently OT Short Term Goal 2 (Week 1): Patient will wash hands and wash face holding wash cloth with set-up assist OT Short Term Goal 3 (Week 1): Patient will maintain dynamic sitting balance, for at least 10 minutes, edge of bed with minimal assistance in order to work on ADL retraining OT Short Term Goal 4 (Week 1): BUE HEP will be introduced to patient  OT Short Term Goal 5 (Week 1): Patient will perform self-feeding task with minimal assistance using AE prn  Skilled Therapeutic Interventions/Progress Updates:   Session #1 0830-1000 - 90 Minutes Individual Therapy Patient with 7/10 complaints of pain, RN made aware Upon entering room, patient found supine in bed. Engaged patient in LB bathing while supine in bed. Therapist introduced long handled sponge to increase independence with LB bathing. From there patient stated she needed to use bathroom and requested a bed pan. Therapist recommended BSC and patient performed edge of bed -> drop arm BSC transfer using slide board and with total assist X2. After BM, patient performed lateral leans using bed to lean on prn for peri care, donning of brief, and donning of pants. During lateral leans, also focused on weight bearing through bilateral shoulders. From there, patient transferred [facility] back to edge of bed using slide board, then edge of bed -> w/c using slide board. After multiple transfers, patient fatigued and requested a rest break. Patient sat at sink in w/c for UB bathing & dressing and grooming tasks. Plan to build up long handled sponge and hair comb. At end of session, left patient seated in w/c beside bed with call bell & phone within reach.    Session #2 1130-1200 - 30 Minutes Individual Therapy No complaints of pain Patient found seated in w/c with complaints of fatigue and being tired. Patient transferred w/c -> edge of bed. Initially, idea was to have patient sit edge of bed to work on dynamic sitting balance/tolerance/endurance however patient to tired and needed to lay supine. Re-positioned patient in bed with +2 assist. Patient then sat with [facility] raised for bilateral UE exercises using theraputty and weighted wrist weights (patient held weights in hands). Patient with poor carryover regarding exercises taught yesterday; however patient extremely fatigue during session. Encouraged patient to engage in self-feeding using built up utensils. At end of session, left patient supine in bed with call bell & phone within reach.   Precautions:  Precautions Precautions: Cervical Precaution Comments:   Required Braces or Orthoses: Other Brace/Splint Other Brace/Splint: Cervical hard collar with thoracic brace component Restrictions Weight Bearing Restrictions: No  See FIM for current functional status  Cassandra Walker 03/25/2013, 7:28 AM

## 2013-03-26 ENCOUNTER — Inpatient Hospital Stay (HOSPITAL_COMMUNITY): Payer: Medicare Other | Admitting: Occupational Therapy

## 2013-03-26 ENCOUNTER — Inpatient Hospital Stay (HOSPITAL_COMMUNITY): Payer: Medicare Other

## 2013-03-26 NOTE — Progress Notes (Signed)
Physical Therapy Session Note  Patient Details  Name: Cassandra Walker MRN: [id number] Date of Birth: January 17, 1932  Today's Date: 03/26/2013 Time: 4098-1191 Time Calculation (min): 60 min  Short Term Goals: Week 2:  PT Short Term Goal 1 (Week 2): Pt will be able to complete basic level slide board transfer bed <-> w/c with max  A of 1 PT Short Term Goal 2 (Week 2): Pt will be able to reposition in w/c with min A (scoot hip back after transfer) PT Short Term Goal 3 (Week 2): Pt will be able to demonstrate dynamic sitting balance for functional tasks with min A  Skilled Therapeutic Interventions/Progress Updates:    Session focused on bed mobility, sitting balance, functional transfers with slide board, and neuro re-ed for trunk control and postural control seated edge of mat for reaching activity. Pt requiring min to max A for dynamic sitting balance tending to lose balance posterior and unable to correct without assistance. During transfers, emphasis on pt maintaining anterior weightshift for safety and to aid with transfer mod to max A on down hill surface and total A otherwise. Theraband added to L rim for increased grip to aid with w/c propulsion and propelled back to room with S; min A needed for turning around in room to reposition w/c.   Therapy Documentation Precautions:  Precautions Precautions: Cervical;Fall Precaution Comments:   Required Braces or Orthoses: Other Brace/Splint Other Brace/Splint: Cervical hard collar with thoracic brace component Restrictions Weight Bearing Restrictions: No  Pain:  Denies pain.  See FIM for current functional status  Therapy/Group: Individual Therapy  [name] [facility] 03/26/2013, 12:04 PM

## 2013-03-26 NOTE — Progress Notes (Signed)
Subjective/Complaints: Left shoulder a little better today. Still sore at times A 12 point review of systems has been performed and if not noted above is otherwise negative.   Objective: Vital Signs: Blood pressure 110/63, pulse 72, temperature 98.3 F (36.8 C), temperature source Oral, resp. rate 17, height 5' 5.5" (1.664 m), weight 73.9 kg (162 lb 14.7 oz), SpO2 96.00%. No results found. No results found for this basename: WBC, HGB, HCT, PLT,  in the last 72 hours  Recent Labs  03/24/13 0605  CREATININE 1.15*   CBG (last 3)   Recent Labs  03/25/13 1635  GLUCAP 107*    Wt Readings from Last 3 Encounters:  03/24/13 73.9 kg (162 lb 14.7 oz)  03/09/13 76.2 kg (167 lb 15.9 oz)  03/09/13 76.2 kg (167 lb 15.9 oz)    Physical Exam:  Constitutional: She is oriented to person, place, and time. She appears well-developed and well-nourished.  HENT:  Head: Normocephalic and atraumatic.  Right Ear: External ear normal.  Left Ear: External ear normal.  Eyes: Conjunctivae and EOM are normal. Pupils are equal, round, and reactive to light. Right eye exhibits no discharge. Left eye exhibits no discharge. No scleral icterus.  Neck: Normal range of motion. No JVD present. No tracheal deviation present. No thyromegaly present.  Cardiovascular: Normal rate and regular rhythm.  Pulmonary/Chest: Effort normal and breath sounds normal. No respiratory distress. She has no wheezes.  Abdominal: Soft. She exhibits no distension. Bowel sounds are decreased. There is no tenderness.  Musculoskeletal: She exhibits tenderness. She exhibits no edema. Left shoulder tenderness fairly non-specific. More tender with ext rotation than internal. Pain along subacromial space perhaps a little more than other areas. Bilateral hands with Dupuytren's contractures. Missing 2nd finger of left hand. Pain along neck and into right trapezius.  Lymphadenopathy:  She has no cervical adenopathy.  Neurological: She is  alert and oriented to person, place, and time.  Hypersensitivity left shin and left forearm. DTRs diminished. Decreased LT to above chest. MOTOR TESTING:           Muscle  Right Left  Deltoid  4- 4 Triceps 2+ 2 Biceps  4 4 Wrist ext 1+ 1+ HI  Tr-1 0 HF  2+ tr KE  3- Tr-1 ADF  3-4 1 APF  3-4 1   .  Psychiatric: She has a normal mood and affect. Her behavior is normal. Judgment and thought content normal.   Assessment/Plan: 1. 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 Physiatrist is providing close team supervision and 24 hour management of active medical problems listed below. Physiatrist and rehab team continue to assess barriers to discharge/monitor patient progress toward functional and medical goals. FIM: FIM - Bathing Bathing Steps Patient Completed: Chest;Right Arm;Left Arm;Abdomen;Right upper leg;Left upper leg Bathing: 3: Mod-Patient completes 5-7 60f 10 parts or 50-74%  FIM - Upper Body Dressing/Undressing Upper body dressing/undressing steps patient completed: Thread/unthread right sleeve of pullover shirt/dresss Upper body dressing/undressing: 2: Max-Patient completed 25-49% of tasks FIM - Lower Body Dressing/Undressing Lower body dressing/undressing: 1: Two helpers (Lat leans seated on drop arm bedside commode)  FIM - Hotel manager Devices: Grab bar or rail for support Toileting: 1: Two helpers (lat leans on [facility])  FIM - Diplomatic Services operational officer Devices: Human resources officer Transfers: 1-Two helpers  FIM - Banker Devices: Sliding board;Bed rails  Bed/Chair Transfer: 1: Two helpers (2 helpers for sit->supine)  FIM - Locomotion: Printmaker: Wheelchair: 2: Travels 50 - 149 ft with minimal assistance (Pt.>75%) FIM - Locomotion:  Ambulation Locomotion: Ambulation: 0: Activity did not occur  Comprehension Comprehension Mode: Auditory Comprehension: 5-Understands complex 90% of the time/Cues < 10% of the time  Expression Expression Mode: Verbal Expression: 5-Expresses complex 90% of the time/cues < 10% of the time  Social Interaction Social Interaction: 5-Interacts appropriately 90% of the time - Needs monitoring or encouragement for participation or interaction.  Problem Solving Problem Solving: 4-Solves basic 75 - 89% of the time/requires cueing 10 - 24% of the time  Memory Memory: 5-Recognizes or recalls 90% of the time/requires cueing < 10% of the time Medical Problem List and Plan:  1. DVT Prophylaxis/Anticoagulation: Pharmaceutical: Lovenox  2. Pain Management: tramadol, oxy IR, mobic. continue  low dose neurontin for neuropathic pain. Scheduled meds with therapies  -ice, increased mobic for shoulder pain, (mild bursitis) 3. Mood: No signs of distress. Reports generalized pain but attempting to deal with this. egosupport 4. Neuropsych: This patient is capable of making decisions on her own behalf.  5. Neurogenic bowel and bladder: requiring I and O caths    -continue urecholine-(requiring caths anyway)  -rx UTI  -working on am bowel routine, off miralax 6. HTN: monitor with bid checks. Continue Lisinopril, tenormin and HCTZ.  7. Leucocytosis: UCX with 100k citrobacter, sensitive to cipro 8. Cough-  -scheduled mucinex, IS, monitor for now--perhaps a little better today  LOS (Days) 9 A FACE TO FACE EVALUATION WAS PERFORMED  Stepheny Canal T 03/26/2013 7:59 AM

## 2013-03-26 NOTE — Progress Notes (Signed)
Physical Therapy Note  Patient Details  Name: Cassandra Walker MRN: [id number] Date of Birth: 16-Feb-1932 Today's Date: 03/26/2013  Pt missed 60 min of skilled PT (scheduled at 1pm) due to refusal due to pt reporting not feeling well, cold, and tired. Pt's sister present and stated that pt had not eaten any lunch due to not feeling good and having diarrhea earlier. Pt unable to recall what time (before or after lunch) this occurred or when she returned back to bed. (This therapist finished session with her at 11:15 and pt was up in w/c at that point). Pt declined any sort of mobility in the bed or attempts to sit up. RN notified of pt's complaints. Also asked for soft touch call bell to be placed in pt room due to decreased functional use of hands bilaterally.  Checked back with pt at 1430 to see if she was willing to participate after having some time to rest, but pt declined. She states she still does not feel well and would prefer to sleep. All needs in place.  [name] [facility] 03/26/2013, 2:31 PM

## 2013-03-26 NOTE — Progress Notes (Signed)
Occupational Therapy Session Note & Weekly Progress Note  Patient Details  Name: Cassandra Walker MRN: [id number] Date of Birth: [birth year]  Today's Date: 03/26/2013  SESSION NOTE Time: 0830-0930 Time Calculation (min): 60 min Individual Therapy No complaints of pain Patient found supine in bed eating breakfast with HOB raised. Patient eating with fingers, therapist encouraged patient to use fork and patient willing. Patient able to request red foam for built-up utensil with questioning ques from therapist, patient also used blue mug to increase independence with drinking/managing cup. After eating, engaged patient in bed mobility and re-positioning for BADL at bed level. During LB bathing patient with request to use bathroom. Therapist assisted patient with supine -> sit (max assist). Patient then transferred edge of bed -> BSC using slide board (with pillow case as barrier) with total assist X2. Patient with no accident and able to control bowel movement. After toileting, patient performed lateral leans for therapist to donn brief and pants. Afterwards, patient transferred -> edge of bed and requested to lay down for awhile prior to next therapy session. Left patient supine in bed with call bell & phone within reach.   ----------------------------------------------------------------------------------------------------------------------  WEEKLY PROGRESS NOTE  Patient has met 5 of 5 short term goals.  Patient making slow progress on CIR. Patient continues to require total assist X2 for all functional transfers using slide board. Have been working on patient self-directing care prn for BADLs, self feeding, grooming tasks, and transfers. Patient currently uses built up foam on utensils and toothbrush in order to increase independence with self-feeding and grooming tasks. No family has been present for education. Recommending patient discharge -> SNF for additional therapy prior to discharging home.    Patient continues to demonstrate the following deficits: decreased functional use of bilateral UEs, decreased dynamic sitting balance/tolerance/endurance, decreased independence with BADLs, decreased independence with functional transfers, decreased independence with functional mobility, decreased independence with self-feeding & grooming tasks. Therefore, patient will continue to benefit from skilled OT intervention to enhance overall performance with BADL, iADL and Reduce care partner burden.  Patient progressing toward long term goals..  Continue plan of care.  OT Short Term Goals Week 1:  OT Short Term Goal 1 (Week 1): Patient will hold toothpaste, unscrew toothpaste lid, and put toothpaste onto toothbrush independently OT Short Term Goal 1 - Progress (Week 1): Met OT Short Term Goal 2 (Week 1): Patient will wash hands and wash face holding wash cloth with set-up assist OT Short Term Goal 2 - Progress (Week 1): Met OT Short Term Goal 3 (Week 1): Patient will maintain dynamic sitting balance, for at least 10 minutes, edge of bed with minimal assistance in order to work on ADL retraining OT Short Term Goal 3 - Progress (Week 1): Met OT Short Term Goal 4 (Week 1): BUE HEP will be introduced to patient  OT Short Term Goal 4 - Progress (Week 1): Met OT Short Term Goal 5 (Week 1): Patient will perform self-feeding task with minimal assistance using AE prn OT Short Term Goal 5 - Progress (Week 1): Met  Week 2:  OT Short Term Goal 1 (Week 2): Patient will perform [facility] transfer using slide board with total assistX1 OT Short Term Goal 2 (Week 2): Patient will consistenly be set-up assist for self-feeding tasks using built up utensils prn OT Short Term Goal 3 (Week 2): Patient will complete UB dressing with moderate assistance OT Short Term Goal 4 (Week 2): Patient will recall HEP using theraputty with min verbal  cues OT Short Term Goal 5 (Week 2): Tub/shower transfer using tub transfer bench will be  attempted with patient  Skilled Therapeutic Interventions/Progress Updates:  Balance/vestibular training;Community reintegration;Discharge planning;Disease mangement/prevention;DME/adaptive equipment instruction;Cognitive remediation/compensation;Functional mobility training;Functional electrical stimulation;Neuromuscular re-education;Pain management;Patient/family education;Psychosocial support;Self Care/advanced ADL retraining;Skin care/wound managment;Splinting/orthotics;Therapeutic Activities;Therapeutic Exercise;UE/LE Strength taining/ROM;UE/LE Coordination activities;Wheelchair propulsion/positioning;Visual/perceptual remediation/compensation   Precautions:  Precautions Precautions: Cervical;Fall Precaution Comments:   Required Braces or Orthoses: Other Brace/Splint Other Brace/Splint: Cervical hard collar with thoracic brace component Restrictions Weight Bearing Restrictions: No  See FIM for current functional status  Aby Gessel 03/26/2013, 10:00 AM

## 2013-03-27 ENCOUNTER — Inpatient Hospital Stay (HOSPITAL_COMMUNITY): Payer: Medicare Other | Admitting: Occupational Therapy

## 2013-03-27 ENCOUNTER — Inpatient Hospital Stay (HOSPITAL_COMMUNITY): Payer: Medicare Other

## 2013-03-27 DIAGNOSIS — IMO0002 Reserved for concepts with insufficient information to code with codable children: Secondary | ICD-10-CM

## 2013-03-27 DIAGNOSIS — W19XXXA Unspecified fall, initial encounter: Secondary | ICD-10-CM

## 2013-03-27 DIAGNOSIS — K592 Neurogenic bowel, not elsewhere classified: Secondary | ICD-10-CM

## 2013-03-27 DIAGNOSIS — N319 Neuromuscular dysfunction of bladder, unspecified: Secondary | ICD-10-CM

## 2013-03-27 MED ORDER — GABAPENTIN 100 MG PO CAPS
100.0000 mg | ORAL_CAPSULE | Freq: Three times a day (TID) | ORAL | Status: DC
  Administered 2013-03-27 – 2013-04-03 (×21): 100 mg via ORAL
  Filled 2013-03-27 (×24): qty 1

## 2013-03-27 NOTE — Progress Notes (Signed)
Subjective/Complaints: Right shoulder was sore last night. Not bothering her now.   A 12 point review of systems has been performed and if not noted above is otherwise negative.   Objective: Vital Signs: Blood pressure 136/68, pulse 74, temperature 98.6 F (37 C), temperature source Oral, resp. rate 18, height 5' 5.5" (1.664 m), weight 73.9 kg (162 lb 14.7 oz), SpO2 97.00%. No results found. No results found for this basename: WBC, HGB, HCT, PLT,  in the last 72 hours No results found for this basename: NA, K, CL, CO, GLUCOSE, BUN, CREATININE, CALCIUM,  in the last 72 hours CBG (last 3)   Recent Labs  03/25/13 1635  GLUCAP 107*    Wt Readings from Last 3 Encounters:  03/24/13 73.9 kg (162 lb 14.7 oz)  03/09/13 76.2 kg (167 lb 15.9 oz)  03/09/13 76.2 kg (167 lb 15.9 oz)    Physical Exam:  Constitutional: She is oriented to person, place, and time. She appears well-developed and well-nourished.  HENT:  Head: Normocephalic and atraumatic.  Right Ear: External ear normal.  Left Ear: External ear normal.  Eyes: Conjunctivae and EOM are normal. Pupils are equal, round, and reactive to light. Right eye exhibits no discharge. Left eye exhibits no discharge. No scleral icterus.  Neck: Normal range of motion. No JVD present. No tracheal deviation present. No thyromegaly present.  Cardiovascular: Normal rate and regular rhythm.  Pulmonary/Chest: Effort normal and breath sounds normal. No respiratory distress. She has no wheezes.  Abdominal: Soft. She exhibits no distension. Bowel sounds are decreased. There is no tenderness.  Musculoskeletal: She exhibits tenderness. She exhibits no edema. Left shoulder tenderness fairly non-specific. More tender with ext rotation than internal. Pain along subacromial space perhaps a little more than other areas. Bilateral hands with Dupuytren's contractures. Missing 2nd finger of left hand.  No consistent provocation of pain with manip of  shoulder Lymphadenopathy: She has no cervical adenopathy.  Neurological: She is alert and oriented to person, place, and time.  DTRs diminished. Decreased LT to above chest. MOTOR TESTING:           Muscle  Right Left  Deltoid  4- 4 Triceps 2+ 2 Biceps  4 4 Wrist ext 1+ 1+ HI  Tr-1 0 HF  2+ tr KE  3- Tr-1 ADF  3-4 1 APF  3-4 1   Psychiatric: She has a normal mood and affect. Her behavior is normal. Judgment and thought content normal.   Assessment/Plan: 1. 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 Physiatrist is providing close team supervision and 24 hour management of active medical problems listed below. Physiatrist and rehab team continue to assess barriers to discharge/monitor patient progress toward functional and medical goals. FIM: FIM - Bathing Bathing Steps Patient Completed: Chest;Right Arm;Left Arm;Abdomen;Right upper leg;Left upper leg Bathing: 0: Activity did not occur  FIM - Upper Body Dressing/Undressing Upper body dressing/undressing steps patient completed: Thread/unthread right sleeve of pullover shirt/dresss Upper body dressing/undressing: 0: Activity did not occur FIM - Lower Body Dressing/Undressing Lower body dressing/undressing: 1: Two helpers  FIM - Hotel manager Devices: Grab bar or rail for support Toileting: 1: Two helpers  FIM - Diplomatic Services operational officer Devices: Human resources officer Transfers: 1-Two helpers  FIM - Banker Devices: Sliding board;Bed rails;HOB elevated Bed/Chair Transfer: 2: Supine > Sit: Max A (lifting assist/Pt. 25-49%);1: Two  helpers  FIM - Locomotion: Printmaker: Wheelchair: 2: Travels 50 - 149 ft with supervision, cueing or coaxing FIM - Locomotion: Ambulation Locomotion: Ambulation: 0: Activity  did not occur  Comprehension Comprehension Mode: Auditory Comprehension: 5-Understands basic 90% of the time/requires cueing < 10% of the time  Expression Expression Mode: Verbal Expression: 5-Expresses basic 90% of the time/requires cueing < 10% of the time.  Social Interaction Social Interaction: 6-Interacts appropriately with others with medication or extra time (anti-anxiety, antidepressant).  Problem Solving Problem Solving: 4-Solves basic 75 - 89% of the time/requires cueing 10 - 24% of the time  Memory Memory: 5-Recognizes or recalls 90% of the time/requires cueing < 10% of the time Medical Problem List and Plan:  1. DVT Prophylaxis/Anticoagulation: Pharmaceutical: Lovenox  2. Pain Management: tramadol, oxy IR, mobic. continue  low dose neurontin for neuropathic pain. Scheduled meds with therapies  -ice, increased mobic for shoulder pain (now bilateral)--probably radicular- increased neurontin slightly 3. Mood: No signs of distress. Reports generalized pain but attempting to deal with this. egosupport 4. Neuropsych: This patient is capable of making decisions on her own behalf.  5. Neurogenic bowel and bladder: requiring I and O caths    -continue urecholine-(requiring caths anyway)  -rx UTI  -working on am bowel routine, off miralax 6. HTN: monitor with bid checks. Continue Lisinopril, tenormin and HCTZ.  7. Leucocytosis: UCX with 100k citrobacter, sensitive to cipro 8. Cough-  -scheduled mucinex, IS, monitor for now--perhaps a little better today  LOS (Days) 10 A FACE TO FACE EVALUATION WAS PERFORMED  Jaylaa Gallion T 03/27/2013 8:30 AM

## 2013-03-27 NOTE — Progress Notes (Signed)
Physical Therapy Session Note  Patient Details  Name: Cassandra Walker MRN: [id number] Date of Birth: 11/14/1931  Today's Date: 03/27/2013 Time: 4098-1191 Time Calculation (min): 55 min  Short Term Goals: Week 2:  PT Short Term Goal 1 (Week 2): Pt will be able to complete basic level slide board transfer bed <-> w/c with max  A of 1 PT Short Term Goal 2 (Week 2): Pt will be able to reposition in w/c with min A (scoot hip back after transfer) PT Short Term Goal 3 (Week 2): Pt will be able to demonstrate dynamic sitting balance for functional tasks with min A  Skilled Therapeutic Interventions/Progress Updates:    Pt had received medication for pain around 1015 per [name] (granddaughter). Pt still states she isn't feeling well and not up to doing therapy: "Can I just rest for today and try again tomorrow?" With encouragement, pt agreeable to work on supine therex for functional LE strengthening including active assisted heel slides, ankle pumps, and hip abduction x 12 reps BLE. Discussion with pt during activity on interests of shopping and suggested getting OOB to go down to the gift shop and go outside to get fresh air (pt very active outdoors prior to admission), and agreeable to this idea. Transferred OOB with slideboard with max A +2 into w/c and went downstairs to gift shop with therapists and granddaughter. Pt propelled w/c in gift shop with S/min A on carpeted surface navigating obstacles and went outdoors for emotional/mood support. Pt states she feels a little better being up and outside, but continued to be flat for most of session. Encouragement from staff and granddaughter to eat lunch; left up in w/c with family and visitor.   Therapy Documentation Precautions:  Precautions Precautions: Cervical;Fall Precaution Comments:   Required Braces or Orthoses: Other Brace/Splint Other Brace/Splint: Cervical hard collar with thoracic brace component Restrictions Weight Bearing Restrictions:  No Pain: Pain Assessment Pain Assessment: 0-10 Pain Score:   5 Faces Pain Scale: Hurts little more Pain Type: Acute pain Pain Location: Arm Pain Orientation: Right Pain Radiating Towards: neck Pain Descriptors: Burning;Sharp;Nagging Pain Frequency: Intermittent Pain Onset: Gradual Patients Stated Pain Goal: 2 Pain Intervention(s): Medication (See eMAR) Multiple Pain Sites: No  See FIM for current functional status  Therapy/Group: Individual Therapy  [name] [facility] 03/27/2013, 11:47 AM

## 2013-03-27 NOTE — Progress Notes (Signed)
Occupational Therapy Session Note  Patient Details  Name: Cassandra Walker MRN: [id number] Date of Birth: 03/30/1932  Today's Date: 03/27/2013 Time: 09:05-09:45 (40 mins) Pt missed 20 mins of skilled OT secondary to fatigue & refusal. Individual Therapy  Session #1:  Upon entering room, pt seated on [facility] with nursing staff present.  Pt and nursing staff reported that pt was not feeling well today. Pt w/ complaints of bilat shld pain, RN administered pain meds. When finished w/ toileting, pt performed lat leans to allow OT to perform peri care and donning of diaper.  After toileting, OT performed UB/LB bathing and encouraged pt to participate but pt refused.  Pt also did not participate in UB dressing while seated on BSC secondary to complaints of fatigue.  Pt then transferred from BSC->bed using sliding board (total A x2).  Pt rolled from side <> side to assist in LB dressing process.  Pt encouraged to complete hand/finger ex's throughout day and encouraged fluids by providing Ensure drink. After tx session, pt supine in bed in "chair position" with soft call bell within reach.  Session #2: Time: 15:20-15:50 (30 mins) Individual Therapy Pt with no c/o pain. Upon entering room, pt supine in bed asleep. Pt from supine->EOB with max assist. Pt then transferred from bed->W/C using sliding board (2 person transfer). Pt stated she already completed grooming task and wheeled down to rehab gym by OT. Skilled intervention focused on FM coordination, eye-hand coordination, dynamic sitting balance, and trunk control/endurance/strength. Pt engaged in picking up medium-sized cylindrical blocks and reaching to place them into pegboard using bilat hands. Next, pt engaged in picking up clothes pins working on grasp & release. At end of tx session, pt's grand-daughter wheeled pt back to room.  Short Term Goals: Week 1:  OT Short Term Goal 1 (Week 1): Patient will hold toothpaste, unscrew toothpaste lid, and put  toothpaste onto toothbrush independently OT Short Term Goal 1 - Progress (Week 1): Met OT Short Term Goal 2 (Week 1): Patient will wash hands and wash face holding wash cloth with set-up assist OT Short Term Goal 2 - Progress (Week 1): Met OT Short Term Goal 3 (Week 1): Patient will maintain dynamic sitting balance, for at least 10 minutes, edge of bed with minimal assistance in order to work on ADL retraining OT Short Term Goal 3 - Progress (Week 1): Met OT Short Term Goal 4 (Week 1): BUE HEP will be introduced to patient  OT Short Term Goal 4 - Progress (Week 1): Met OT Short Term Goal 5 (Week 1): Patient will perform self-feeding task with minimal assistance using AE prn OT Short Term Goal 5 - Progress (Week 1): Met Week 2:  OT Short Term Goal 1 (Week 2): Patient will perform [facility] transfer using slide board with total assistX1 OT Short Term Goal 2 (Week 2): Patient will consistenly be set-up assist for self-feeding tasks using built up utensils prn OT Short Term Goal 3 (Week 2): Patient will complete UB dressing with moderate assistance OT Short Term Goal 4 (Week 2): Patient will recall HEP using theraputty with min verbal cues OT Short Term Goal 5 (Week 2): Tub/shower transfer using tub transfer bench will be attempted with patient  Skilled Therapeutic Interventions/Progress Updates:  Individual Therapy  Therapy Documentation Precautions:  Precautions Precautions: Cervical;Fall Precaution Comments:   Required Braces or Orthoses: Other Brace/Splint Other Brace/Splint: Cervical hard collar with thoracic brace component Restrictions Weight Bearing Restrictions: No  See FIM for current functional status  Therapy/Group: Individual Therapy  Roderic Palau 03/27/2013, 3:59 PM

## 2013-03-27 NOTE — Progress Notes (Signed)
Pt vomited of emesis. Observed by this Clinical research associate and charge nurse Charisse March, Charity fundraiser. Emesis appeared to be brown in color similar to pt chocolate ensure she has been drinking with morning meal and at lunch time. Plan to hold pt next schedule dose of ensure at 16:00. Pt is resting in bed with head elevated, bed in lowest position, siderails up and call bell in reach.

## 2013-03-27 NOTE — Progress Notes (Signed)
Note reviewed and accurately reflects treatment session.   

## 2013-03-27 NOTE — Progress Notes (Signed)
Physical Therapy Session Note  Patient Details  Name: Cassandra Walker MRN: [id number] Date of Birth: June 03, 1932  Today's Date: 03/27/2013 Time: 4098-1191 Time Calculation (min): 20 min (Missed 10 min due to on [facility] for toileting needs)  Short Term Goals: Week 2:  PT Short Term Goal 1 (Week 2): Pt will be able to complete basic level slide board transfer bed <-> w/c with max  A of 1 PT Short Term Goal 2 (Week 2): Pt will be able to reposition in w/c with min A (scoot hip back after transfer) PT Short Term Goal 3 (Week 2): Pt will be able to demonstrate dynamic sitting balance for functional tasks with min A  Skilled Therapeutic Interventions/Progress Updates:    Upon therapist entering the room, pt states she really doesn't feel well again today. She is unsure if she was able to sleep last night and unable to expand on what she feels. Pt seems flat and quiet today. Reports need to have BM; max A for supine to sit and total A +2 to transfer to [facility]. Able to maintain sitting balance while on BSC with S when propped with pillow behind back for support. Pt's granddaughter arrived and discussion and education with her in regards to how therapy is going and recommendations at this time for home. Granddaughter [name]) states that it depends how much care pt requires, but they are open to SNF option for further rehab before d/c home. Missed time due to toileting needs and RN present in room and aware of pt's increased pain, lethargy, and change in affect today.  Therapy Documentation Precautions:  Precautions Precautions: Cervical;Fall Precaution Comments:   Required Braces or Orthoses: Other Brace/Splint Other Brace/Splint: Cervical hard collar with thoracic brace component Restrictions Weight Bearing Restrictions: No  Pain: Reports pain in R shoulder initially and later states it is both shoulders.  See FIM for current functional status  Therapy/Group: Individual Therapy  [name]  [facility] 03/27/2013, 11:43 AM

## 2013-03-27 NOTE — Progress Notes (Signed)
Followed up with family on daughter's concerns regarding need of narcotics. Patient having a bad morning due to pain. Pain regimen, progress, B/B issues as well as fatigue in evening due to endurance issues discussed with granddaughter. She wants patient to have pain relief and is agreeable to current program. Ego support provided and family encouraged to take patient outside and bring food from home to help with overall mood.

## 2013-03-28 ENCOUNTER — Inpatient Hospital Stay (HOSPITAL_COMMUNITY): Payer: Medicare Other | Admitting: *Deleted

## 2013-03-28 DIAGNOSIS — I10 Essential (primary) hypertension: Secondary | ICD-10-CM

## 2013-03-28 DIAGNOSIS — G8254 Quadriplegia, C5-C7 incomplete: Secondary | ICD-10-CM

## 2013-03-28 NOTE — Progress Notes (Signed)
Patient ID: Cassandra Walker, female   DOB: 1932/03/05, 79 y.o.   MRN: [id number]  Subjective/Complaints:  80/85.  79 year old patient status post C7 cervical fracture. History of UTI hypertension and urinary retention. States no bowel movements in 4 days. HEENT-Aspen collar in place; chest clear anterolaterally; cardiovascular normal heart sounds no tachycardia; abdomen soft does not appear distended bowel sounds are active; extremities trace peripheral edema  BP Readings from Last 3 Encounters:  03/28/13 130/72  03/17/13 110/60  03/17/13 110/60   Impression-status post C7 cervical fracture with incomplete quadriplegia Hypertension well controlled   Objective: Vital Signs: Blood pressure 130/72, pulse 66, temperature 97.8 F (36.6 C), temperature source Oral, resp. rate 16, height 5' 5.5" (1.664 m), weight 73.6 kg (162 lb 4.1 oz), SpO2 95.00%. No results found. No results found for this basename: WBC, HGB, HCT, PLT,  in the last 72 hours No results found for this basename: NA, K, CL, CO, GLUCOSE, BUN, CREATININE, CALCIUM,  in the last 72 hours CBG (last 3)   Recent Labs  03/25/13 1635  GLUCAP 107*    Wt Readings from Last 3 Encounters:  03/28/13 73.6 kg (162 lb 4.1 oz)  03/09/13 76.2 kg (167 lb 15.9 oz)  03/09/13 76.2 kg (167 lb 15.9 oz)    Physical Exam:  Constitutional: She is oriented to person, place, and time. She appears well-developed and well-nourished.  HENT:  Head: Normocephalic and atraumatic.  Right Ear: External ear normal.  Left Ear: External ear normal.  Eyes: Conjunctivae and EOM are normal. Pupils are equal, round, and reactive to light. Right eye exhibits no discharge. Left eye exhibits no discharge. No scleral icterus.  Neck: Normal range of motion. No JVD present. No tracheal deviation present. No thyromegaly present.  Cardiovascular: Normal rate and regular rhythm.  Pulmonary/Chest: Effort normal and breath sounds normal. No respiratory distress. She has  no wheezes.  Abdominal: Soft. She exhibits no distension. Bowel sounds are decreased. There is no tenderness.  Musculoskeletal: She exhibits tenderness. She exhibits no edema. Left shoulder tenderness fairly non-specific. More tender with ext rotation than internal. Pain along subacromial space perhaps a little more than other areas. Bilateral hands with Dupuytren's contractures. Missing 2nd finger of left hand.  No consistent provocation of pain with manip of shoulder Lymphadenopathy: She has no cervical adenopathy.  Neurological: She is alert and oriented to person, place, and time.  DTRs diminished. Decreased LT to above chest. MOTOR TESTING:           Muscle  Right Left  Deltoid  4- 4 Triceps 2+ 2 Biceps  4 4 Wrist ext 1+ 1+ HI  Tr-1 0 HF  2+ tr KE  3- Tr-1 ADF  3-4 1 APF  3-4 1   Psychiatric: She has a normal mood and affect. Her behavior is normal. Judgment and thought content normal.   Assessment/Plan: 1. 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 Physiatrist is providing close team supervision and 24 hour management of active medical problems listed below. Physiatrist and rehab team continue to assess barriers to discharge/monitor patient progress toward functional and medical goals. FIM: FIM - Bathing Bathing Steps Patient Completed: Chest;Right Arm;Left Arm;Abdomen;Right upper leg;Left upper leg Bathing: 0: Activity did not occur  FIM - Upper Body Dressing/Undressing Upper body dressing/undressing steps patient completed: Thread/unthread right sleeve of pullover shirt/dresss Upper body dressing/undressing: 0: Activity did not occur FIM -  Lower Body Dressing/Undressing Lower body dressing/undressing: 0: Activity did not occur  FIM - Hotel manager Devices: Grab bar or rail for support Toileting: 0: Activity did not occur  FIM -  Diplomatic Services operational officer Devices: Human resources officer Transfers: 0-Activity did not occur  FIM - Banker Devices: Sliding board;Arm rests;Bed rails Bed/Chair Transfer: 1: Two helpers;2: Supine > Sit: Max A (lifting assist/Pt. 25-49%)  FIM - Locomotion: Wheelchair Locomotion: Wheelchair: 2: Travels 50 - 149 ft with supervision, cueing or coaxing FIM - Locomotion: Ambulation Locomotion: Ambulation: 0: Activity did not occur  Comprehension Comprehension Mode: Auditory Comprehension: 5-Understands complex 90% of the time/Cues < 10% of the time  Expression Expression Mode: Verbal Expression: 5-Expresses complex 90% of the time/cues < 10% of the time  Social Interaction Social Interaction: 6-Interacts appropriately with others with medication or extra time (anti-anxiety, antidepressant).  Problem Solving Problem Solving: 5-Solves complex 90% of the time/cues < 10% of the time  Memory Memory: 6-More than reasonable amt of time Medical Problem List and Plan:  1. DVT Prophylaxis/Anticoagulation: Pharmaceutical: Lovenox  2. Pain Management: tramadol, oxy IR, mobic. continue  low dose neurontin for neuropathic pain. Scheduled meds with therapies  -ice, increased mobic for shoulder pain (now bilateral)--probably radicular- increased neurontin slightly 3. Mood: No signs of distress. Reports generalized pain but attempting to deal with this. egosupport 4. Neuropsych: This patient is capable of making decisions on her own behalf.  5. Neurogenic bowel and bladder: requiring I and O caths    -continue urecholine-(requiring caths anyway)  -rx UTI  -working on am bowel routine, off miralax 6. HTN: monitor with bid checks. Continue Lisinopril, tenormin and HCTZ.  7. Leucocytosis: UCX with 100k citrobacter, sensitive to cipro 8. Cough-  -scheduled mucinex, IS, monitor for now--perhaps a little better today  LOS (Days)  11 A FACE TO FACE EVALUATION WAS PERFORMED  Rogelia Boga 03/28/2013 8:02 AM

## 2013-03-28 NOTE — Progress Notes (Signed)
Physical Therapy Note  Patient Details  Name: JENY NIELD MRN: [id number] Date of Birth: [birth year] Today's Date: 03/28/2013  1100-1155 (55 minutes) individual Pain: 6/10 neck/upper back/ nurse notified / meds given Other:Intermittent c/o dizziness with position changes (BP in sitting 125/69) Focus of treatment: Bed mobility training; transfer training; therapeutic activities focused on trunk control in sitting; therapeutic exercise focused on bilateral LE strengthening Treatment: pt in bed upon arrival; pt rolled from side to side to donn slacks with max assist; transfers total assist +1 with sliding board; sitting - pt able to maintain midline sitting without UE support and  with mod vcs close SBA  but unable to correct loss of balance in any direction; sitting performing small trunk circles clockwise/counterclockwise with close SBA to min assist; sit to supine mod/max assist; supine to side to sit max assist LEs/trunk with tactile cues for use of UEs to assist; Therapeutic exercise X 15 - AA bilateral Hip flexion/extension  using therapy ball in supine , ankle pumps, hip abduction. Pt returned to room with pressure sensitive call device in place.   Gae Bihl,JIM 03/28/2013, 11:29 AM

## 2013-03-29 ENCOUNTER — Inpatient Hospital Stay (HOSPITAL_COMMUNITY): Payer: Medicare Other | Admitting: *Deleted

## 2013-03-29 NOTE — Progress Notes (Signed)
No void, at 2350 bladder scan=278. At 0200 I & O cath=600. At 0630 bladder scan=258. Cassandra Walker

## 2013-03-29 NOTE — Progress Notes (Signed)
Patient ID: Cassandra Walker, female   DOB: 1932/07/29, 79 y.o.   MRN: [id number]   Patient ID: Cassandra Walker, female   DOB: September 06, 1932, 79 y.o.   MRN: [id number]  Subjective/Complaints:  70/33.  79 year old patient status post C7 cervical fracture. History of UTI hypertension and urinary retention. Doing well without concerns or complaints  HEENT-Aspen collar in place; chest clear anterolaterally; cardiovascular normal heart sounds no tachycardia; abdomen soft does not appear distended bowel sounds are active; extremities trace peripheral edema  BP Readings from Last 3 Encounters:  03/29/13 110/69  03/17/13 110/60  03/17/13 110/60   Impression-status post C7 cervical fracture with incomplete quadriplegia Hypertension well controlled   Plan- continue Lovenox DVT prophylaxis  Objective: Vital Signs: Blood pressure 110/69, pulse 68, temperature 98.2 F (36.8 C), temperature source Oral, resp. rate 18, height 5' 5.5" (1.664 m), weight 73.6 kg (162 lb 4.1 oz), SpO2 93.00%. No results found. No results found for this basename: WBC, HGB, HCT, PLT,  in the last 72 hours No results found for this basename: NA, K, CL, CO, GLUCOSE, BUN, CREATININE, CALCIUM,  in the last 72 hours CBG (last 3)  No results found for this basename: GLUCAP,  in the last 72 hours  Wt Readings from Last 3 Encounters:  03/28/13 73.6 kg (162 lb 4.1 oz)  03/09/13 76.2 kg (167 lb 15.9 oz)  03/09/13 76.2 kg (167 lb 15.9 oz)    Physical Exam:  Constitutional: She is oriented to person, place, and time. She appears well-developed and well-nourished.  HENT:  Head: Normocephalic and atraumatic.  Right Ear: External ear normal.  Left Ear: External ear normal.  Eyes: Conjunctivae and EOM are normal. Pupils are equal, round, and reactive to light. Right eye exhibits no discharge. Left eye exhibits no discharge. No scleral icterus.  Neck: Normal range of motion. No JVD present. No tracheal deviation present. No thyromegaly  present.  Cardiovascular: Normal rate and regular rhythm.  Pulmonary/Chest: Effort normal and breath sounds normal. No respiratory distress. She has no wheezes.  Abdominal: Soft. She exhibits no distension. Bowel sounds are decreased. There is no tenderness.  Musculoskeletal: She exhibits tenderness. She exhibits no edema. Left shoulder tenderness fairly non-specific. More tender with ext rotation than internal. Pain along subacromial space perhaps a little more than other areas. Bilateral hands with Dupuytren's contractures. Missing 2nd finger of left hand.  No consistent provocation of pain with manip of shoulder Lymphadenopathy: She has no cervical adenopathy.  Neurological: She is alert and oriented to person, place, and time.  DTRs diminished. Decreased LT to above chest. MOTOR TESTING:           Muscle  Right Left  Deltoid  4- 4 Triceps 2+ 2 Biceps  4 4 Wrist ext 1+ 1+ HI  Tr-1 0 HF  2+ tr KE  3- Tr-1 ADF  3-4 1 APF  3-4 1   Psychiatric: She has a normal mood and affect. Her behavior is normal. Judgment and thought content normal.   Assessment/Plan: 1. 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 Physiatrist is providing close team supervision and 24 hour management of active medical problems listed below. Physiatrist and rehab team continue to assess barriers to discharge/monitor patient progress toward functional and medical goals. FIM: FIM - Bathing Bathing Steps Patient Completed: Chest;Right Arm;Left Arm;Abdomen;Right upper leg;Left upper leg Bathing: 0: Activity did not occur  FIM - Upper Body Dressing/Undressing Upper body dressing/undressing steps patient completed: Thread/unthread right sleeve of pullover shirt/dresss Upper body dressing/undressing: 0: Activity did not occur FIM - Lower Body Dressing/Undressing Lower body dressing/undressing: 0:  Activity did not occur  FIM - Hotel manager Devices: Grab bar or rail for support Toileting: 0: Activity did not occur  FIM - Diplomatic Services operational officer Devices: Human resources officer Transfers: 0-Activity did not occur  FIM - Architectural technologist Transfer: 2: Chair or W/C > Bed: Max A (lift and lower assist)  FIM - Locomotion: Wheelchair Locomotion: Wheelchair: 2: Travels 50 - 149 ft with supervision, cueing or coaxing FIM - Locomotion: Ambulation Locomotion: Ambulation: 0: Activity did not occur  Comprehension Comprehension Mode: Auditory Comprehension: 5-Understands complex 90% of the time/Cues < 10% of the time  Expression Expression Mode: Verbal Expression: 5-Expresses complex 90% of the time/cues < 10% of the time  Social Interaction Social Interaction: 6-Interacts appropriately with others with medication or extra time (anti-anxiety, antidepressant).  Problem Solving Problem Solving: 5-Solves complex 90% of the time/cues < 10% of the time  Memory Memory: 6-More than reasonable amt of time Medical Problem List and Plan:  1. DVT Prophylaxis/Anticoagulation: Pharmaceutical: Lovenox  2. Pain Management: tramadol, oxy IR, mobic. continue  low dose neurontin for neuropathic pain. Scheduled meds with therapies  -ice, increased mobic for shoulder pain (now bilateral)--probably radicular- increased neurontin slightly 3. Mood: No signs of distress. Reports generalized pain but attempting to deal with this. egosupport 4. Neuropsych: This patient is capable of making decisions on her own behalf.  5. Neurogenic bowel and bladder: requiring I and O caths    -continue urecholine-(requiring caths anyway)  -rx UTI  -working on am bowel routine, off miralax 6. HTN: monitor with bid checks. Continue Lisinopril, tenormin and HCTZ.  7. Leucocytosis: UCX with 100k citrobacter,  sensitive to cipro 8. Cough-  -scheduled mucinex, IS, monitor for now--perhaps a little better today  LOS (Days) 12 A FACE TO FACE EVALUATION WAS PERFORMED  Rogelia Boga 03/29/2013 7:55 AM

## 2013-03-29 NOTE — Progress Notes (Addendum)
Occupational Therapy Note  Patient Details  Name: Cassandra Walker MRN: [id number] Date of Birth: August 16, 1932 Today's Date: 03/29/2013  Time:  0730-0830  (60 min) Individual session Pain:  3/10 from sitting on bedpan; relieved once off the bedpan.    Pt. Sitting on bedpan upon OT arrival.  Pt. Complained that bedpan was hurting her bottom.  Pt. Unable to have BM so transferred to [facility].  Engaged in therapeutic activities, transfers to [facility], sitting balance EOB with UE support, pressure relief, lateral leaning, scooting.  Pt transferred to [facility] with total assist +2.  Washed face, arms.  Donned shirt with minimal assist.  Transferred back to bed and placed in supine with call bell on chest.    Humberto Seals 03/29/2013, 8:32 AM

## 2013-03-30 ENCOUNTER — Inpatient Hospital Stay (HOSPITAL_COMMUNITY): Payer: Medicare Other

## 2013-03-30 ENCOUNTER — Inpatient Hospital Stay (HOSPITAL_COMMUNITY): Payer: Medicare Other | Admitting: Occupational Therapy

## 2013-03-30 NOTE — Progress Notes (Signed)
No void, I & O cath=228ml, urine amber. Patient reports feeling no urge to void. Likes K-pad to upper back/shoulder area. Needs moderate assistance with bed mobility. Alfredo Martinez A

## 2013-03-30 NOTE — Progress Notes (Signed)
Physical Therapy Session Note  Patient Details  Name: Cassandra Walker MRN: [id number] Date of Birth: July 23, 1932  Today's Date: 03/30/2013 Time: 1330-1400 (Full treatment time 1300-1400) Time Calculation (min): 30 min  Short Term Goals: Week 2:  PT Short Term Goal 1 (Week 2): Pt will be able to complete basic level slide board transfer bed <-> w/c with max  A of 1 PT Short Term Goal 2 (Week 2): Pt will be able to reposition in w/c with min A (scoot hip back after transfer) PT Short Term Goal 3 (Week 2): Pt will be able to demonstrate dynamic sitting balance for functional tasks with min A  Skilled Therapeutic Interventions/Progress Updates:    Co-treatment with OT with focus on functional slide board transfers, dynamic sitting balance to work on functional use of UE's and weightshifts forward/backwards, and sit to squat and progressed to lateral scoot/squats to aid with transfers and functional strengthening. Pt able to maintain dynamic balance with min A and transfers max A with second person to stabilize w/c or assist with slideboard positioning.   Therapy Documentation Precautions:  Precautions Precautions: Cervical;Fall Precaution Comments:   Required Braces or Orthoses: Other Brace/Splint Other Brace/Splint: Cervical hard collar with thoracic brace component Restrictions Weight Bearing Restrictions: No  Pain: Pain Assessment Pain Score: 0-No pain  See FIM for current functional status  Therapy/Group: Individual Therapy and Co-Treatment with OT  [name] [facility] 03/30/2013, 2:53 PM

## 2013-03-30 NOTE — Progress Notes (Signed)
Occupational Therapy Session Note  Patient Details  Name: Cassandra Walker MRN: [id number] Date of Birth: 06-24-1932  Today's Date: 03/30/2013 Time: 0830-0930 Time Calculation (min): 60 min  Short Term Goals: Week 1:  OT Short Term Goal 1 (Week 1): Patient will hold toothpaste, unscrew toothpaste lid, and put toothpaste onto toothbrush independently OT Short Term Goal 1 - Progress (Week 1): Met OT Short Term Goal 2 (Week 1): Patient will wash hands and wash face holding wash cloth with set-up assist OT Short Term Goal 2 - Progress (Week 1): Met OT Short Term Goal 3 (Week 1): Patient will maintain dynamic sitting balance, for at least 10 minutes, edge of bed with minimal assistance in order to work on ADL retraining OT Short Term Goal 3 - Progress (Week 1): Met OT Short Term Goal 4 (Week 1): BUE HEP will be introduced to patient  OT Short Term Goal 4 - Progress (Week 1): Met OT Short Term Goal 5 (Week 1): Patient will perform self-feeding task with minimal assistance using AE prn OT Short Term Goal 5 - Progress (Week 1): Met Week 2:  OT Short Term Goal 1 (Week 2): Patient will perform [facility] transfer using slide board with total assistX1 OT Short Term Goal 2 (Week 2): Patient will consistenly be set-up assist for self-feeding tasks using built up utensils prn OT Short Term Goal 3 (Week 2): Patient will complete UB dressing with moderate assistance OT Short Term Goal 4 (Week 2): Patient will recall HEP using theraputty with min verbal cues OT Short Term Goal 5 (Week 2): Tub/shower transfer using tub transfer bench will be attempted with patient  Skilled Therapeutic Interventions/Progress Updates:   Session #1: Time:  0830-0930 (60 mins) Individual Therapy Pt with no c/o pain.  Upon entering room, pt seated supine in bed. Pt engaged in LB bathing/dressing @ bed level. Pt completed LB bathing with use of long-handled sponge and was able to bring RLE towards body to increase independence with  bathing. OT introduced a reacher to assist the pt in doffing of socks. For LB dressing, pt able to assist in donning of pants by bridging hips. Next, pt supine->sit->W/C using slide board (total assist +2). UB bathing and dressing and grooming completed from W/C level @ sink. At end of tx session, pt seated in W/C with call bell and phone within reach.  Session #2: Time:  1300-1330 (30 mins) Co-treatment with PT Pt with no c/o pain.  Upon entering room, pt supine in bed.  Pt supine->sitting EOB->W/C using slide board (2 person transfer). Pt propelled W/C part of the way to rehab gym (~100 ft) to work on [employer] strength/endurance. Pt transferred W/C->therapy mat using slide board. On therapy mat skilled intervention focused on squats @ mat level and slides across mat to increase independence with slide board transfers. Also focused on pt's static/dynamic sitting balance, postural control, reaching, fxal use of BUE's, grasp & release, forward leaning to increase independence with slide board transfers, and safety with transfers. To end the tx session pt transferred mat->W/C using slide board and granddaughter wheeled pt back to her room.  Therapy Documentation Precautions:  Precautions Precautions: Cervical;Fall Precaution Comments:   Required Braces or Orthoses: Other Brace/Splint Other Brace/Splint: Cervical hard collar with thoracic brace component Restrictions Weight Bearing Restrictions: No  See FIM for current functional status    Kimbria Camposano 03/30/2013, 11:50 AM

## 2013-03-30 NOTE — Progress Notes (Signed)
Notes reviewed and accurately reflects treatment session.

## 2013-03-30 NOTE — Progress Notes (Signed)
Subjective/Complaints: Pain better in shoulders, had a good night.  A 12 point review of systems has been performed and if not noted above is otherwise negative.   Objective: Vital Signs: Blood pressure 108/66, pulse 52, temperature 98.1 F (36.7 C), temperature source Oral, resp. rate 18, height 5' 5.5" (1.664 m), weight 73.6 kg (162 lb 4.1 oz), SpO2 94.00%. No results found. No results found for this basename: WBC, HGB, HCT, PLT,  in the last 72 hours No results found for this basename: NA, K, CL, CO, GLUCOSE, BUN, CREATININE, CALCIUM,  in the last 72 hours CBG (last 3)  No results found for this basename: GLUCAP,  in the last 72 hours  Wt Readings from Last 3 Encounters:  03/28/13 73.6 kg (162 lb 4.1 oz)  03/09/13 76.2 kg (167 lb 15.9 oz)  03/09/13 76.2 kg (167 lb 15.9 oz)    Physical Exam:  Constitutional: She is oriented to person, place, and time. She appears well-developed and well-nourished.  HENT:  Head: Normocephalic and atraumatic.  Right Ear: External ear normal.  Left Ear: External ear normal.  Eyes: Conjunctivae and EOM are normal. Pupils are equal, round, and reactive to light. Right eye exhibits no discharge. Left eye exhibits no discharge. No scleral icterus.  Neck: Normal range of motion. No JVD present. No tracheal deviation present. No thyromegaly present.  Cardiovascular: Normal rate and regular rhythm.  Pulmonary/Chest: Effort normal and breath sounds normal. No respiratory distress. She has no wheezes.  Abdominal: Soft. She exhibits no distension. Bowel sounds are decreased. There is no tenderness.  Musculoskeletal: She exhibits tenderness. She exhibits no edema. Left shoulder tenderness fairly non-specific. More tender with ext rotation than internal. Pain along subacromial space perhaps a little more than other areas. Bilateral hands with Dupuytren's contractures. Missing 2nd finger of left hand.  No consistent provocation of pain with manip of  shoulder Lymphadenopathy: She has no cervical adenopathy.  Neurological: She is alert and oriented to person, place, and time.  DTRs diminished. Decreased LT to above chest. MOTOR TESTING:           Muscle  Right Left  Deltoid  4- 4 Triceps 2+ 2 Biceps  4 4 Wrist ext 1+ 1+ HI  Tr-1 0 HF  2+ tr KE  3- Tr-1 ADF  3-4 1 APF  3-4 1   Psychiatric: She has a normal mood and affect. Her behavior is normal. Judgment and thought content normal.   Assessment/Plan: 1. 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 Physiatrist is providing close team supervision and 24 hour management of active medical problems listed below. Physiatrist and rehab team continue to assess barriers to discharge/monitor patient progress toward functional and medical goals. FIM: FIM - Bathing Bathing Steps Patient Completed: Left Arm;Chest;Right Arm;Abdomen Bathing: 2: Max-Patient completes 3-4 24f 10 parts or 25-49%  FIM - Upper Body Dressing/Undressing Upper body dressing/undressing steps patient completed: Thread/unthread right sleeve of pullover shirt/dresss;Thread/unthread left sleeve of pullover shirt/dress;Put head through opening of pull over shirt/dress Upper body dressing/undressing: 3: Mod-Patient completed 50-74% of tasks FIM - Lower Body Dressing/Undressing Lower body dressing/undressing: 1: Total-Patient completed less than 25% of tasks  FIM - Hotel manager Devices: Grab bar or rail for support Toileting: 1: Two helpers  FIM - Diplomatic Services operational officer Devices: Human resources officer Transfers: 1-Two helpers  FIM - Banker  Devices: Sliding board Bed/Chair Transfer: 2: Chair or W/C > Bed: Max A (lift and lower assist)  FIM - Locomotion: Wheelchair Locomotion: Wheelchair: 2: Travels 50 -  149 ft with supervision, cueing or coaxing FIM - Locomotion: Ambulation Locomotion: Ambulation: 0: Activity did not occur  Comprehension Comprehension Mode: Auditory Comprehension: 5-Understands complex 90% of the time/Cues < 10% of the time  Expression Expression Mode: Verbal Expression: 5-Expresses complex 90% of the time/cues < 10% of the time  Social Interaction Social Interaction: 6-Interacts appropriately with others with medication or extra time (anti-anxiety, antidepressant).  Problem Solving Problem Solving: 5-Solves complex 90% of the time/cues < 10% of the time  Memory Memory: 6-More than reasonable amt of time Medical Problem List and Plan:  1. DVT Prophylaxis/Anticoagulation: Pharmaceutical: Lovenox  2. Pain Management: tramadol, oxy IR, mobic. continue  low dose neurontin for neuropathic pain. Scheduled meds with therapies  -ice, increased mobic for shoulder pain (now bilateral)--probably radicular- increased neurontin slightly 3. Mood: No signs of distress. Reports generalized pain but attempting to deal with this. egosupport 4. Neuropsych: This patient is capable of making decisions on her own behalf.  5. Neurogenic bowel and bladder: requiring I and O caths    -continue urecholine-(requiring caths anyway)  -rx UTI  -working on am bowel routine, off miralax 6. HTN: monitor with bid checks. Continue Lisinopril, tenormin and HCTZ.  7. Leucocytosis: UCX with 100k citrobacter, sensitive to cipro 8. Cough-  -scheduled mucinex, IS, monitor for now--perhaps a little better today  LOS (Days) 13 A FACE TO FACE EVALUATION WAS PERFORMED  SWARTZ,ZACHARY T 03/30/2013 6:30 AM

## 2013-03-30 NOTE — Progress Notes (Signed)
Recreational Therapy Session Note  Patient Details  Name: Cassandra Walker MRN: [id number] Date of Birth: 02/05/1932 Today's Date: 03/30/2013 Time:10-11 Pain: no c/o Skilled Therapeutic Interventions/Progress Updates: Session focused on activity tolerance, w/c mobility, standing balance, functional use of BUE's.  Pt propelled w/c on unit using BUE's with supervision.  Pt stood in standing frame ~5 minutes x2  to work on Archivist using BUE's to apply glue and manipulate mosaic pieces.  Remainder of session focused on UE use for tabletop craft seated in w/c with supervision/ setup.  Therapy/Group: Co-Treatment  Bevelyn Arriola 03/30/2013, 12:47 PM

## 2013-03-30 NOTE — Progress Notes (Signed)
Physical Therapy Session Note  Patient Details  Name: Cassandra Walker MRN: [id number] Date of Birth: 11-Apr-1932  Today's Date: 03/30/2013 Time: 1000-1100 Time Calculation (min): 60 min  Short Term Goals: Week 2:  PT Short Term Goal 1 (Week 2): Pt will be able to complete basic level slide board transfer bed <-> w/c with max  A of 1 PT Short Term Goal 2 (Week 2): Pt will be able to reposition in w/c with min A (scoot hip back after transfer) PT Short Term Goal 3 (Week 2): Pt will be able to demonstrate dynamic sitting balance for functional tasks with min A  Skilled Therapeutic Interventions/Progress Updates:    [place] with recreational therapy with focus on standing tolerance, postural control in standing, and functional task with hands to work on motor control (mosaic puzzle). Pt able to tolerate about 5 min in standing each time with improved postural control noted. Completed activity seated in w/c when to fatigued to continue in standing. W/c propulsion with S for endurance and strengthening. Max A of 1 with second person to stabilize chair and assist with balance to transfer back to bed at end of session to rest. Improved activity tolerance noted today in session. Pt also appears to be in better spirits.   Therapy Documentation Precautions:  Precautions Precautions: Cervical;Fall Precaution Comments:   Required Braces or Orthoses: Other Brace/Splint Other Brace/Splint: Cervical hard collar with thoracic brace component Restrictions Weight Bearing Restrictions: No    Pain: Denies pain.  See FIM for current functional status  Therapy/Group: Individual Therapy and Co-Treatment  [name] [facility] 03/30/2013, 12:08 PM

## 2013-03-31 ENCOUNTER — Inpatient Hospital Stay (HOSPITAL_COMMUNITY): Payer: Medicare Other | Admitting: *Deleted

## 2013-03-31 ENCOUNTER — Ambulatory Visit: Payer: Medicare Other | Admitting: Cardiovascular Disease

## 2013-03-31 ENCOUNTER — Inpatient Hospital Stay (HOSPITAL_COMMUNITY): Payer: Medicare Other

## 2013-03-31 ENCOUNTER — Inpatient Hospital Stay (HOSPITAL_COMMUNITY): Payer: Medicare Other | Admitting: Occupational Therapy

## 2013-03-31 LAB — CREATININE, SERUM
Creatinine, Ser: 1.27 mg/dL — ABNORMAL HIGH (ref 0.50–1.10)
GFR calc non Af Amer: 39 mL/min — ABNORMAL LOW (ref >90)

## 2013-03-31 NOTE — Progress Notes (Signed)
Occupational Therapy Session Note  Patient Details  Name: Cassandra Walker MRN: [id number] Date of Birth: [birth year]  Today's Date: 03/31/2013  Short Term Goals: Week 1:  OT Short Term Goal 1 (Week 1): Patient will hold toothpaste, unscrew toothpaste lid, and put toothpaste onto toothbrush independently OT Short Term Goal 1 - Progress (Week 1): Met OT Short Term Goal 2 (Week 1): Patient will wash hands and wash face holding wash cloth with set-up assist OT Short Term Goal 2 - Progress (Week 1): Met OT Short Term Goal 3 (Week 1): Patient will maintain dynamic sitting balance, for at least 10 minutes, edge of bed with minimal assistance in order to work on ADL retraining OT Short Term Goal 3 - Progress (Week 1): Met OT Short Term Goal 4 (Week 1): BUE HEP will be introduced to patient  OT Short Term Goal 4 - Progress (Week 1): Met OT Short Term Goal 5 (Week 1): Patient will perform self-feeding task with minimal assistance using AE prn OT Short Term Goal 5 - Progress (Week 1): Met Week 2:  OT Short Term Goal 1 (Week 2): Patient will perform [facility] transfer using slide board with total assistX1 OT Short Term Goal 2 (Week 2): Patient will consistenly be set-up assist for self-feeding tasks using built up utensils prn OT Short Term Goal 3 (Week 2): Patient will complete UB dressing with moderate assistance OT Short Term Goal 4 (Week 2): Patient will recall HEP using theraputty with min verbal cues OT Short Term Goal 5 (Week 2): Tub/shower transfer using tub transfer bench will be attempted with patient  Skilled Therapeutic Interventions/Progress Updates:   Session #1: Time: 0830-0930 (60 mins) Pt with no report of pain.  Upon entering room, pt supine in bed. OT introduced dressing stick to increase independence with doffing of socks. Pt completed LB bathing use long-handled sponge while seated in chair position in bed. Pt able to assist with LB dressing & donning of diaper by using bridging  technique and rolling side<>side. Next, pt supine->sit->W/C using slide board (2 person transfer; 1 person for stabilizing slide board). Pt able to initiate mvmt during the slide board transfer and recalled appropriate hand placement during transfer. From W/C level pt completed UB bathing/dressing while seated @ sink. At end of tx session, pt seated in W/C with soft call bell under L hand.   Therapy Documentation Precautions:  Precautions Precautions: Cervical;Fall Precaution Comments:   Required Braces or Orthoses: Other Brace/Splint Other Brace/Splint: Cervical hard collar with thoracic brace component Restrictions Weight Bearing Restrictions: No  See FIM for current functional status  Therapy/Group: Individual Therapy  Venancio Chenier 03/31/2013, 7:11 AM

## 2013-03-31 NOTE — Progress Notes (Signed)
Physical Therapy Session Note  Patient Details  Name: Cassandra Walker MRN: [id number] Date of Birth: Aug 08, 1932  Today's Date: 03/31/2013 Time: 1000-1030 Time Calculation (min): 30 min  Short Term Goals: Week 2:  PT Short Term Goal 1 (Week 2): Pt will be able to complete basic level slide board transfer bed <-> w/c with max  A of 1 PT Short Term Goal 2 (Week 2): Pt will be able to reposition in w/c with min A (scoot hip back after transfer) PT Short Term Goal 3 (Week 2): Pt will be able to demonstrate dynamic sitting balance for functional tasks with min A  Skilled Therapeutic Interventions/Progress Updates:    [place] with recreational therapy with focus on standing tolerance and neuro re-ed for postural control in standing frame while working on mosaic puzzle for functional use of BUEs. Pt able to tolerate x 12 min in standing frame to complete activity with improved postural control noted. Scooting back in w/c with cues to push through LE's.  Therapy Documentation Precautions:  Precautions Precautions: Cervical;Fall Precaution Comments:   Required Braces or Orthoses: Other Brace/Splint Other Brace/Splint: Cervical hard collar with thoracic brace component Restrictions Weight Bearing Restrictions: No  Pain:  Discomfort due to cervical collar - repositioned as able.  See FIM for current functional status  Therapy/Group: Individual Therapy and Co-Treatment with Recreational Therapy  [name] [facility] 03/31/2013, 1:06 PM

## 2013-03-31 NOTE — Progress Notes (Signed)
Subjective/Complaints: Feels well, enjoyed spending time with her family yesterday.  A 12 point review of systems has been performed and if not noted above is otherwise negative.   Objective: Vital Signs: Blood pressure 112/57, pulse 67, temperature 98 F (36.7 C), temperature source Oral, resp. rate 17, height 5' 5.5" (1.664 m), weight 73.6 kg (162 lb 4.1 oz), SpO2 100.00%. No results found. No results found for this basename: WBC, HGB, HCT, PLT,  in the last 72 hours  Recent Labs  03/31/13 0515  CREATININE 1.27*   CBG (last 3)  No results found for this basename: GLUCAP,  in the last 72 hours  Wt Readings from Last 3 Encounters:  03/28/13 73.6 kg (162 lb 4.1 oz)  03/09/13 76.2 kg (167 lb 15.9 oz)  03/09/13 76.2 kg (167 lb 15.9 oz)    Physical Exam:  Constitutional: She is oriented to person, place, and time. She appears well-developed and well-nourished.  HENT:  Head: Normocephalic and atraumatic.  Right Ear: External ear normal.  Left Ear: External ear normal.  Eyes: Conjunctivae and EOM are normal. Pupils are equal, round, and reactive to light. Right eye exhibits no discharge. Left eye exhibits no discharge. No scleral icterus.  Neck: Normal range of motion. No JVD present. No tracheal deviation present. No thyromegaly present.  Cardiovascular: Normal rate and regular rhythm.  Pulmonary/Chest: Effort normal and breath sounds normal. No respiratory distress. She has no wheezes.  Abdominal: Soft. She exhibits no distension. Bowel sounds are decreased. There is no tenderness.  Musculoskeletal: She exhibits tenderness. She exhibits no edema. Left shoulder tenderness fairly non-specific. More tender with ext rotation than internal. Pain along subacromial space perhaps a little more than other areas. Bilateral hands with Dupuytren's contractures. Missing 2nd finger of left hand.  No consistent provocation of pain with manip of shoulder Lymphadenopathy: She has no cervical  adenopathy.  Neurological: She is alert and oriented to person, place, and time.  DTRs diminished. Decreased LT to above chest. MOTOR TESTING:           Muscle  Right Left  Deltoid  4- 4 Triceps 2+ 2 Biceps  4 4 Wrist ext 2 2- HI  Tr-1 0 HF  2+ tr KE  3- Tr-1 ADF  3-4 1 APF  3-4 1   Psychiatric: She has a normal mood and affect. Her behavior is normal. Judgment and thought content normal.   Assessment/Plan: 1. 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 Physiatrist is providing close team supervision and 24 hour management of active medical problems listed below. Physiatrist and rehab team continue to assess barriers to discharge/monitor patient progress toward functional and medical goals. FIM: FIM - Bathing Bathing Steps Patient Completed: Chest;Right Arm;Left Arm;Abdomen;Right upper leg;Left upper leg;Right lower leg (including foot);Left lower leg (including foot) Bathing: 4: Min-Patient completes 8-9 18f 10 parts or 75+ percent  FIM - Upper Body Dressing/Undressing Upper body dressing/undressing steps patient completed: Thread/unthread right sleeve of pullover shirt/dresss;Thread/unthread left sleeve of pullover shirt/dress;Put head through opening of pull over shirt/dress Upper body dressing/undressing: 4: Min-Patient completed 75 plus % of tasks FIM - Lower Body Dressing/Undressing Lower body dressing/undressing: 1: Total-Patient completed less than 25% of tasks  FIM - Hotel manager Devices: Grab bar or rail for support Toileting: 0: Activity did not occur  FIM - Diplomatic Services operational officer Devices: Human resources officer Transfers: 0-Activity  did not occur  FIM - Press photographer Assistive Devices: Arm rests;Sliding board Bed/Chair Transfer: 1: Two helpers;2: Sit > Supine: Max A  (lifting assist/Pt. 25-49%)  FIM - Locomotion: Wheelchair Locomotion: Wheelchair: 2: Travels 50 - 149 ft with supervision, cueing or coaxing FIM - Locomotion: Ambulation Locomotion: Ambulation: 0: Activity did not occur  Comprehension Comprehension Mode: Auditory Comprehension: 5-Understands basic 90% of the time/requires cueing < 10% of the time  Expression Expression Mode: Verbal Expression: 7-Expresses complex ideas: With no assist  Social Interaction Social Interaction: 7-Interacts appropriately with others - No medications needed.  Problem Solving Problem Solving: 5-Solves basic 90% of the time/requires cueing < 10% of the time  Memory Memory: 5-Recognizes or recalls 90% of the time/requires cueing < 10% of the time Medical Problem List and Plan:  1. DVT Prophylaxis/Anticoagulation: Pharmaceutical: Lovenox  2. Pain Management: tramadol, oxy IR, mobic. continue  low dose neurontin for neuropathic pain. Scheduled meds with therapies  -ice, increased mobic for shoulder pain (now bilateral)--probably radicular- increased neurontin with good results 3. Mood: No signs of distress. Reports generalized pain but attempting to deal with this. egosupport 4. Neuropsych: This patient is capable of making decisions on her own behalf.  5. Neurogenic bowel and bladder: requiring I and O caths    -urecholine stopped  -rx'ed UTI  -working on am bowel routine, off miralax 6. HTN: monitor with bid checks. Continue Lisinopril, tenormin and HCTZ.  7. Leukocytosis--recheck tomorrow, has been trending down 8. Cough-  -scheduled mucinex, IS,   LOS (Days) 14 A FACE TO FACE EVALUATION WAS PERFORMED  SWARTZ,ZACHARY T 03/31/2013 8:11 AM

## 2013-03-31 NOTE — Progress Notes (Signed)
Recreational Therapy Session Note  Patient Details  Name: Cassandra Walker MRN: [id number] Date of Birth: September 07, 1932 Today's Date: 03/31/2013 Time:  08-1026 Pain: no c/o Skilled Therapeutic Interventions/Progress Updates: Session focused on activity tolerance, standing tolerance & balance, BUE use.  Pt stood in standing frame ~10 minutes while completing table mosaic craft using BUE's.    Therapy/Group: Co-Treatment  Jashawn Floyd 03/31/2013, 12:30 PM

## 2013-03-31 NOTE — Progress Notes (Signed)
Occupational Therapy Session Note  Patient Details  Name: NKECHI LINEHAN MRN: [id number] Date of Birth: 03/17/1932  Today's Date: 03/31/2013 Time: 1150-1200 Time Calculation (min): 10 min  Short Term Goals: Week 1:  OT Short Term Goal 1 (Week 1): Patient will hold toothpaste, unscrew toothpaste lid, and put toothpaste onto toothbrush independently OT Short Term Goal 1 - Progress (Week 1): Met OT Short Term Goal 2 (Week 1): Patient will wash hands and wash face holding wash cloth with set-up assist OT Short Term Goal 2 - Progress (Week 1): Met OT Short Term Goal 3 (Week 1): Patient will maintain dynamic sitting balance, for at least 10 minutes, edge of bed with minimal assistance in order to work on ADL retraining OT Short Term Goal 3 - Progress (Week 1): Met OT Short Term Goal 4 (Week 1): BUE HEP will be introduced to patient  OT Short Term Goal 4 - Progress (Week 1): Met OT Short Term Goal 5 (Week 1): Patient will perform self-feeding task with minimal assistance using AE prn OT Short Term Goal 5 - Progress (Week 1): Met  Week 2:  OT Short Term Goal 1 (Week 2): Patient will perform [facility] transfer using slide board with total assistX1 OT Short Term Goal 2 (Week 2): Patient will consistenly be set-up assist for self-feeding tasks using built up utensils prn OT Short Term Goal 3 (Week 2): Patient will complete UB dressing with moderate assistance OT Short Term Goal 4 (Week 2): Patient will recall HEP using theraputty with min verbal cues OT Short Term Goal 5 (Week 2): Tub/shower transfer using tub transfer bench will be attempted with patient  Skilled Therapeutic Interventions/Progress Updates:  Total treatment time 1140-1200 Calculated time 1150-1200 - 10 Minutes Co-Treatment with OTR Patient found with OTR in day room. Therapist propelled patient from dayroom -> therapy gym. Once in therapy gym patient transferred onto therapy mat using slide board with moderate assistance (second person  present to hold board in place). Patient then sat edge of mat for focus on dynamic sitting balance/tolerance/endurance, forward leaning to help increase independence with slide board transfer as well as trunk control/coordination/strength, and overall activity tolerance/endurance. Patient transferred back to w/c at end of session and therapist propelled patient back to room.   Precautions:  Precautions Precautions: Cervical;Fall Precaution Comments:   Required Braces or Orthoses: Other Brace/Splint Other Brace/Splint: Cervical hard collar with thoracic brace component Restrictions Weight Bearing Restrictions: No  See FIM for current functional status  Latora Quarry 03/31/2013, 12:10 PM

## 2013-03-31 NOTE — Progress Notes (Signed)
Note reviewed and accurately reflects treatment session.   

## 2013-03-31 NOTE — Progress Notes (Signed)
Physical Therapy Session Note  Patient Details  Name: Cassandra Walker MRN: [id number] Date of Birth: 1932-08-10  Today's Date: 03/31/2013 Time: 1030-1100 Time Calculation (min): 30 min  Short Term Goals: Week 2:  PT Short Term Goal 1 (Week 2): Pt will be able to complete basic level slide board transfer bed <-> w/c with max  A of 1 PT Short Term Goal 2 (Week 2): Pt will be able to reposition in w/c with min A (scoot hip back after transfer) PT Short Term Goal 3 (Week 2): Pt will be able to demonstrate dynamic sitting balance for functional tasks with min A  Skilled Therapeutic Interventions/Progress Updates:    Pt reports feeling better this week as compared to last week.  Pt c/o runny nose and states she continues to forget to tell MD, will notify nursing of pt c/o. Also c/o of discomfort from [place] collar, slightly adjusted and instructed pt to improve upright posture with pt reporting improved comfort.  Pt performed sliding board transfer with max assist +1 with extra person needed to support wheelchair.  Partial sit<>stand from mat table 2 x 5 reps with max assist +2 and mod vcs, pt not able to completely raise buttocks from mat table to get to standing but displays good motivation and carryover with vcs, sit <>stand with use of eva walker with max assist + 2  X 2 trials with pt able to maintain standing for approximately 1 minute first attempt and 2 minutes on 2nd attempt, requires constant cueing to improve upright standing to improve B knee extension and forward flexed posture. Pt also performed scoot side to side on mat table.  Stand squat pivot transfer mat table to w/c with max assist +1 and mod assist for extra person.    Therapy Documentation Precautions:  Precautions Precautions: Cervical;Fall Precaution Comments:   Required Braces or Orthoses: Other Brace/Splint Other Brace/Splint: Cervical hard collar with thoracic brace component Restrictions Weight Bearing Restrictions:  No Pain: Pain Assessment Pain Score: 4/10 cervical region and R shoulder region.    Locomotion : Wheelchair Mobility Distance: 100 feet             See FIM for current functional status  Therapy/Group: Individual Therapy  Jackelyn Knife 03/31/2013, 11:44 AM

## 2013-03-31 NOTE — Progress Notes (Signed)
Physical Therapy Session Note  Patient Details  Name: Cassandra Walker MRN: [id number] Date of Birth: [birth year]  Today's Date: 03/31/2013 Time: 1330-1400 Time Calculation (min): 30 min  Short Term Goals: Week 2:  PT Short Term Goal 1 (Week 2): Pt will be able to complete basic level slide board transfer bed <-> w/c with max  A of 1 PT Short Term Goal 2 (Week 2): Pt will be able to reposition in w/c with min A (scoot hip back after transfer) PT Short Term Goal 3 (Week 2): Pt will be able to demonstrate dynamic sitting balance for functional tasks with min A  Skilled Therapeutic Interventions/Progress Updates:   Session focused on functional slide board transfers with emphasis on lifting into squat position vs scooting. Sitting balance activity seated edge of mat to work on functional balance to aid with transfers and transitional movements.   Therapy Documentation Precautions:  Precautions Precautions: Cervical;Fall Precaution Comments:   Required Braces or Orthoses: Other Brace/Splint Other Brace/Splint: Cervical hard collar with thoracic brace component Restrictions Weight Bearing Restrictions: No   Pain: Pain Assessment Pain Score: 0-No pain  See FIM for current functional status  Therapy/Group: Individual Therapy  [name] [facility] 03/31/2013, 2:05 PM

## 2013-03-31 NOTE — Progress Notes (Signed)
Occupational Therapy Session Note  Patient Details  Name: Cassandra Walker MRN: [id number] Date of Birth: September 15, 1932  Today's Date: 03/31/2013 Time: 1130-1150 Time Calculation (min): 20 min  Short Term Goals: Week 1:  OT Short Term Goal 1 (Week 1): Patient will hold toothpaste, unscrew toothpaste lid, and put toothpaste onto toothbrush independently OT Short Term Goal 1 - Progress (Week 1): Met OT Short Term Goal 2 (Week 1): Patient will wash hands and wash face holding wash cloth with set-up assist OT Short Term Goal 2 - Progress (Week 1): Met OT Short Term Goal 3 (Week 1): Patient will maintain dynamic sitting balance, for at least 10 minutes, edge of bed with minimal assistance in order to work on ADL retraining OT Short Term Goal 3 - Progress (Week 1): Met OT Short Term Goal 4 (Week 1): BUE HEP will be introduced to patient  OT Short Term Goal 4 - Progress (Week 1): Met OT Short Term Goal 5 (Week 1): Patient will perform self-feeding task with minimal assistance using AE prn OT Short Term Goal 5 - Progress (Week 1): Met Week 2:  OT Short Term Goal 1 (Week 2): Patient will perform [facility] transfer using slide board with total assistX1 OT Short Term Goal 2 (Week 2): Patient will consistenly be set-up assist for self-feeding tasks using built up utensils prn OT Short Term Goal 3 (Week 2): Patient will complete UB dressing with moderate assistance OT Short Term Goal 4 (Week 2): Patient will recall HEP using theraputty with min verbal cues OT Short Term Goal 5 (Week 2): Tub/shower transfer using tub transfer bench will be attempted with patient  Skilled Therapeutic Interventions/Progress Updates:    Co-treatment completed this AM with other OT. Pt propelled self in w/c from room to dayroom with BUE. Engaged in sitting balance and core strengthening activity as she flexed forward in w/c to water plants while using RUE to control small water pitcher. Therapist then propelled pt to therapy gym.  Completed slide board transfer with mod assist w/c<>mat to Rt side with second preson to assist with holding board. Engaged in forward reach activity while sitting unsupported on mat to increase trunk control and RUE coordination. Therapist propelled pt back to room and pt left with call light and all items within reach.    Therapy Documentation Precautions:  Precautions Precautions: Cervical;Fall Precaution Comments:   Required Braces or Orthoses: Other Brace/Splint Other Brace/Splint: Cervical hard collar with thoracic brace component Restrictions Weight Bearing Restrictions: No General:   Vital Signs: Therapy Vitals Pulse Rate: 68 BP: 107/60 mmHg Pain: No c/o pain during therapy session. Discomfort noted with cervical collar.   See FIM for current functional status  Therapy/Group: Co-Treatment  Chima Astorino, Vara Guardian 03/31/2013, 12:18 PM

## 2013-04-01 ENCOUNTER — Inpatient Hospital Stay (HOSPITAL_COMMUNITY): Payer: Medicare Other | Admitting: *Deleted

## 2013-04-01 ENCOUNTER — Inpatient Hospital Stay (HOSPITAL_COMMUNITY): Payer: Medicare Other | Admitting: Occupational Therapy

## 2013-04-01 ENCOUNTER — Inpatient Hospital Stay (HOSPITAL_COMMUNITY): Payer: Medicare Other

## 2013-04-01 LAB — BASIC METABOLIC PANEL
CO2: 31 mEq/L (ref 19–32)
Calcium: 9 mg/dL (ref 8.4–10.5)
Creatinine, Ser: 1.17 mg/dL — ABNORMAL HIGH (ref 0.50–1.10)
GFR calc non Af Amer: 43 mL/min — ABNORMAL LOW (ref >90)
Glucose, Bld: 101 mg/dL — ABNORMAL HIGH (ref 70–99)

## 2013-04-01 LAB — CBC
MCH: 28.3 pg (ref 26.0–34.0)
MCHC: 32.8 g/dL (ref 30.0–36.0)
MCV: 86.3 fL (ref 78.0–100.0)
Platelets: 400 10*3/uL (ref 150–400)
RBC: 3.71 MIL/uL — ABNORMAL LOW (ref 3.87–5.11)

## 2013-04-01 NOTE — Progress Notes (Signed)
Recreational Therapy Session Note  Patient Details  Name: Cassandra Walker MRN: [id number] Date of Birth: 12-31-1931 Today's Date: 04/01/2013 Time:  1137-12 Pain: no c/o pain, but c/o choking due to cervical collar when attempting to sit up for transfer/acticity Skilled Therapeutic Interventions/Progress Updates: Due to discomfort with cervical collar, pt seen bed level with focus on BUE use with HOB slightly elevated to tolerance for acticity.  RN aware of the above and contacting appropriate person for collar adjustment.  Therapy/Group: Co-Treatment  Khushi Zupko 04/01/2013, 4:23 PM

## 2013-04-01 NOTE — Progress Notes (Signed)
Social Work Patient ID: Cassandra Walker, female   DOB: [birth year], 79 y.o.   MRN: [id number]   Have reviewed team conference with patient and grandaughter.  Both understand that limited progress being made and issues continue with bladder function.  Grandaughter reports family is "leaning toward" change in d/c plan to SNF.  Plans to visit facilities end of week and follow up with me after.  Will keep staff posted on family decision.  Blanca Thornton, LCSW

## 2013-04-01 NOTE — Progress Notes (Signed)
Notes reviewed and accurately reflects treatment sessions.   

## 2013-04-01 NOTE — Progress Notes (Signed)
Physical Therapy Session Note  Patient Details  Name: Cassandra Walker MRN: [id number] Date of Birth: [birth year]  Today's Date: 04/01/2013 Time: 5366-4403 Time Calculation (min): 40 min  Short Term Goals: Week 2:  PT Short Term Goal 1 (Week 2): Pt will be able to complete basic level slide board transfer bed <-> w/c with max  A of 1 PT Short Term Goal 2 (Week 2): Pt will be able to reposition in w/c with min A (scoot hip back after transfer) PT Short Term Goal 3 (Week 2): Pt will be able to demonstrate dynamic sitting balance for functional tasks with min A  Skilled Therapeutic Interventions/Progress Updates:    Session limited due to pt's inability to tolerate HOB > 40 degrees due to feeling like her brace is choking her despite repositioning. Focused on functional use of hands using theraputty to roll into different shapes and manipulate with fingers at bed level. Then completed supine therex for functional strengthening and stretching to BLE.  Therapy Documentation Precautions:  Precautions Precautions: Cervical;Fall Precaution Comments:   Required Braces or Orthoses: Other Brace/Splint Other Brace/Splint: Cervical hard collar with thoracic brace component Restrictions Weight Bearing Restrictions: No   Pain: No pain at rest with HOB slightly elevated. But unable to breath when [facility] raised greater than about 40 degrees due to restrictions of cervical collar - RN reports they have called the orthotist again, but has not made it to the unit.  See FIM for current functional status  Therapy/Group: Individual Therapy  [name] [facility] 04/01/2013, 2:38 PM

## 2013-04-01 NOTE — Patient Care Conference (Signed)
Inpatient RehabilitationTeam Conference and Plan of Care Update Date: 03/31/2013   Time: 2:10 PM    Patient Name: Cassandra Walker      Medical Record Number: [id number]  Date of Birth: 1932/02/18 Sex: Female         Room/Bed: 4006/4006-01 Payor Info: Payor: [employer]vertising copy[employer] MEDICARE / Plan: [employer] MEDICARE COMPLETE / Product Type: *No Product type* /    Admitting Diagnosis: traumatic SCI  Admit Date/Time:  03/17/2013  6:07 PM Admission Comments: No comment available   Primary Diagnosis:  C7 cervical fracture Principal Problem: C7 cervical fracture  Patient Active Problem List   Diagnosis Date Noted  . UTI (urinary tract infection) due to citrobacter freundi 03/26/2013  . Leucocytosis 03/26/2013  . C7 cervical fracture 03/18/2013  . Urinary retention 03/17/2013  . Fall 03/10/2013  . Fracture of C5 vertebra, closed 03/10/2013  . C6 cervical fracture 03/10/2013  . Incomplete quadriplegia at C5-6 level 03/10/2013  . HTN (hypertension) 03/10/2013  . Hyperglycemia 03/10/2013  . Pulmonary nodule 07/24/2011    Expected Discharge Date: Expected Discharge Date: 04/10/13  Team Members Present: Physician leading conference: Dr. Faith Rogue Social Worker Present: Amada Jupiter, LCSW Nurse Present: Daryll Brod, RN PT Present: [name], PT OT Present: Edwin Cap, OT SLP Present: Feliberto Gottron, SLP Other (Discipline and Name): Tora Duck, PPS Coordinator     Current Status/Progress Goal Weekly Team Focus  Medical   still requires caths, pain is better overall  stablize medically for dc  bowel program, pain mgt   Bowel/Bladder   0600 bowel program. LBM 03/30/13. Not voiding. requiring I&O cath q 8hrs  Managed bowel and bladder program.   Continue to monitor bladder function q shift   Swallow/Nutrition/ Hydration             ADL's   UB ADL's fluctuates between min-mod A, LB bathing min A, LB dressing total A, feeding & grooming set-up, mat transfers mod assist, fxal  transfers total assist  min assist dressing & bathing goals, mod toileting goal, mod I feeding & grooming goals, min A meal prep goal, mod assist for fxal transfers  BADL's, dynamic sitting balance/endurance, fxal transfers, fxal use of BUE's, FM & coordination   Mobility   mod to max A for bed mobility; mod to max A for slideboard transfers (+2 needed at times), S w/c propulsion  min A w/c level overall  transfers, sitting balance, functional strengthening, activity tolerance, standing activities   Communication             Safety/Cognition/ Behavioral Observations            Pain   Scheduled Oxy IR 10mg  and Ultram 50 mg  <3  Continue to monitor effectiveness of pain medication   Skin   Cervical anterior incision healing appropriately  No new skin breakdown  Assess skin q shift      *See Care Plan and progress notes for long and short-term goals.  Barriers to Discharge: cognitive deficis, see prior    Possible Resolutions to Barriers:  see prrio    Discharge Planning/Teaching Needs:  home with family to provide 24/7 assist vs. SNF      Team Discussion:  Pain and bladder issues continue. On a "good day" pt reaching mod assist level (w/c), however, can require total assistance majority of time.  Difficulty carrying over information.  If family decides to take pt home, they will definitely need a ramp.  SW to follow up on d/c plan  Revisions to Treatment Plan:  None at this time.   Continued Need for Acute Rehabilitation Level of Care: The patient requires daily medical management by a physician with specialized training in physical medicine and rehabilitation for the following conditions: Daily direction of a multidisciplinary physical rehabilitation program to ensure safe treatment while eliciting the highest outcome that is of practical value to the patient.: Yes Daily medical management of patient stability for increased activity during participation in an intensive  rehabilitation regime.: Yes Daily analysis of laboratory values and/or radiology reports with any subsequent need for medication adjustment of medical intervention for : Neurological problems;Post surgical problems;Other  Cassandra Walker 04/01/2013, 1:11 PM

## 2013-04-01 NOTE — Progress Notes (Addendum)
Physical Therapy Weekly Progress Note  Patient Details  Name: Cassandra Walker MRN: [id number] Date of Birth: 1932-02-20  Today's Date: 04/02/2013  Patient has met 2 of 3 short term goals.  Pt with improved activity tolerance and making slow progress beginning of this week, but continues to fluctuate. Focusing on more active slide board transfers and progressing standing from using standing frame only to also using [name] from elevated surface with max A+2 assist. Pt requires max A for level surface transfers (at times still requires +2) and can still be inconsistent with balance from S to mod A for dynamic tasks. Pt continues to have issues with fit of cervical collar/thoracic brace and orthotics has been contacted for fit. Tentative d/c plan is for 6/6 but family is still looking into possibility of SNF for d/c from CIR. Therapy team would recommend SNF at this time due to slow functional progress and limited assistance at home.   Patient continues to demonstrate the following deficits: quadriplegia, decreased balance, decreased activity tolerance, decreased strength, impaired sensation and therefore will continue to benefit from skilled PT intervention to enhance overall performance with activity tolerance, balance, postural control, ability to compensate for deficits and functional use of  right upper extremity, right lower extremity, left upper extremity and left lower extremity.  Patient progressing toward long term goals..  May need downgrade transfer goals to mod A this week.  PT Short Term Goals Week 2:  PT Short Term Goal 1 (Week 2): Pt will be able to complete basic level slide board transfer bed <-> w/c with max  A of 1 PT Short Term Goal 1 - Progress (Week 2): Met PT Short Term Goal 2 (Week 2): Pt will be able to reposition in w/c with min A (scoot hip back after transfer) PT Short Term Goal 2 - Progress (Week 2): Partly met PT Short Term Goal 3 (Week 2): Pt will be able to  demonstrate dynamic sitting balance for functional tasks with min A PT Short Term Goal 3 - Progress (Week 2): Met Week 3:  PT Short Term Goal 1 (Week 3): = LTGs  Skilled Therapeutic Interventions/Progress Updates:  Ambulation/gait training;Balance/vestibular training;Community reintegration;Discharge planning;Disease management/prevention;DME/adaptive equipment instruction;Functional mobility training;Neuromuscular re-education;Pain management;Patient/family education;Psychosocial support;Skin care/wound management;Splinting/orthotics;Stair training;Therapeutic Activities;Therapeutic Exercise;UE/LE Strength taining/ROM;UE/LE Coordination activities;Wheelchair propulsion/positioning;Cognitive remediation/compensation   Therapy Documentation Precautions:  Precautions Precautions: Cervical;Fall Precaution Comments:   Required Braces or Orthoses: Other Brace/Splint Other Brace/Splint: Cervical hard collar with thoracic brace component Restrictions Weight Bearing Restrictions: No  See FIM for current functional status   [name] [facility] 04/02/2013, 7:45 AM

## 2013-04-01 NOTE — Progress Notes (Signed)
Subjective/Complaints: Slept well. Pain under reasonable control. Still getting cathed.  A 12 point review of systems has been performed and if not noted above is otherwise negative.   Objective: Vital Signs: Blood pressure 114/66, pulse 66, temperature 98.2 F (36.8 C), temperature source Oral, resp. rate 18, height 5' 5.5" (1.664 m), weight 73.6 kg (162 lb 4.1 oz), SpO2 98.00%. No results found.  Recent Labs  04/01/13 0543  WBC 6.0  HGB 10.5*  HCT 32.0*  PLT 400    Recent Labs  03/31/13 0515 04/01/13 0543  NA  --  139  K  --  4.1  CL  --  97  GLUCOSE  --  101*  BUN  --  41*  CREATININE 1.27* 1.17*  CALCIUM  --  9.0   CBG (last 3)  No results found for this basename: GLUCAP,  in the last 72 hours  Wt Readings from Last 3 Encounters:  03/28/13 73.6 kg (162 lb 4.1 oz)  03/09/13 76.2 kg (167 lb 15.9 oz)  03/09/13 76.2 kg (167 lb 15.9 oz)    Physical Exam:  Constitutional: She is oriented to person, place, and time. She appears well-developed and well-nourished.  HENT:  Head: Normocephalic and atraumatic.  Right Ear: External ear normal.  Left Ear: External ear normal.  Eyes: Conjunctivae and EOM are normal. Pupils are equal, round, and reactive to light. Right eye exhibits no discharge. Left eye exhibits no discharge. No scleral icterus.  Neck: Normal range of motion. No JVD present. No tracheal deviation present. No thyromegaly present.  Cardiovascular: Normal rate and regular rhythm.  Pulmonary/Chest: Effort normal and breath sounds normal. No respiratory distress. She has no wheezes.  Abdominal: Soft. She exhibits no distension. Bowel sounds are decreased. There is no tenderness.  Musculoskeletal: She exhibits tenderness. She exhibits no edema. Left shoulder tenderness fairly non-specific. More tender with ext rotation than internal. Pain along subacromial space perhaps a little more than other areas. Bilateral hands with Dupuytren's contractures. Missing 2nd  finger of left hand.  No consistent provocation of pain with manip of shoulder Lymphadenopathy: She has no cervical adenopathy.  Neurological: She is alert and oriented to person, place, and time.  DTRs diminished. Decreased LT to above chest. MOTOR TESTING:           Muscle  Right Left  Deltoid  4- 4 Triceps 2+ 2 Biceps  4 4 Wrist ext 2 2- HI  Tr-1 0 HF  2+ tr KE  3- Tr-1 ADF  3-4 1 APF  3-4 1   Psychiatric: She has a normal mood and affect. Her behavior is normal. Judgment and thought content normal.   Assessment/Plan: 1. 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 Physiatrist is providing close team supervision and 24 hour management of active medical problems listed below. Physiatrist and rehab team continue to assess barriers to discharge/monitor patient progress toward functional and medical goals. FIM: FIM - Bathing Bathing Steps Patient Completed: Chest;Right Arm;Left Arm;Abdomen;Front perineal area;Right upper leg;Left upper leg;Right lower leg (including foot);Left lower leg (including foot) Bathing: 4: Min-Patient completes 8-9 69f 10 parts or 75+ percent (with use of AE & seated in bed)  FIM - Upper Body Dressing/Undressing Upper body dressing/undressing steps patient completed: Thread/unthread right sleeve of pullover shirt/dresss;Thread/unthread left sleeve of pullover shirt/dress Upper body dressing/undressing: 3: Mod-Patient completed 50-74% of tasks FIM - Lower Body Dressing/Undressing Lower  body dressing/undressing: 1: Total-Patient completed less than 25% of tasks  FIM - Hotel manager Devices: Grab bar or rail for support Toileting: 0: Activity did not occur  FIM - Diplomatic Services operational officer Devices: Human resources officer Transfers: 0-Activity did not occur  FIM - Physiological scientist Devices: Sliding board;Bed rails Bed/Chair Transfer: 3: Supine > Sit: Mod A (lifting assist/Pt. 50-74%/lift 2 legs;1: Two helpers (bed->W/C 2 helpers (1 helper for Product manager))  FIM - Locomotion: Wheelchair Distance: 100 feet Locomotion: Wheelchair: 2: Travels 50 - 149 ft with minimal assistance (Pt.>75%) FIM - Locomotion: Ambulation Locomotion: Ambulation: 0: Activity did not occur  Comprehension Comprehension Mode: Auditory Comprehension: 5-Follows basic conversation/direction: With extra time/assistive device  Expression Expression Mode: Verbal Expression: 7-Expresses complex ideas: With no assist  Social Interaction Social Interaction: 7-Interacts appropriately with others - No medications needed.  Problem Solving Problem Solving: 5-Solves basic 90% of the time/requires cueing < 10% of the time  Memory Memory: 5-Recognizes or recalls 90% of the time/requires cueing < 10% of the time Medical Problem List and Plan:  1. DVT Prophylaxis/Anticoagulation: Pharmaceutical: Lovenox  2. Pain Management: tramadol, oxy IR, mobic. continue  low dose neurontin for neuropathic pain. Scheduled meds with therapies  -ice, increased mobic for shoulder pain (now bilateral)--probably radicular- increased neurontin with good results 3. Mood: No signs of distress. Reports generalized pain but attempting to deal with this. egosupport 4. Neuropsych: This patient is capable of making decisions on her own behalf.  5. Neurogenic bowel and bladder: requiring I and O caths    -urecholine stopped  -rx'ed UTI  -working on am bowel routine, off miralax 6. HTN: monitor with bid checks. Continue Lisinopril, tenormin  -hctz stopped due to increased BUN 7. Leukocytosis--WBC's normal 8. Cough-  -scheduled mucinex, IS,   LOS (Days) 15 A FACE TO FACE EVALUATION WAS PERFORMED  Marti Mclane T 04/01/2013 9:11 AM

## 2013-04-01 NOTE — Progress Notes (Signed)
Physical Therapy Session Note  Patient Details  Name: Cassandra Walker MRN: [id number] Date of Birth: [birth year]  Today's Date: 04/01/2013 Time: 1100-1158 Time Calculation (min): 58 min  Short Term Goals: Week 2:  PT Short Term Goal 1 (Week 2): Pt will be able to complete basic level slide board transfer bed <-> w/c with max  A of 1 PT Short Term Goal 2 (Week 2): Pt will be able to reposition in w/c with min A (scoot hip back after transfer) PT Short Term Goal 3 (Week 2): Pt will be able to demonstrate dynamic sitting balance for functional tasks with min A  Skilled Therapeutic Interventions/Progress Updates:   Pt in supine and in good spirits; attempted supine to sit EOB but pt choking on cervical collar. Layed pt back down and attempted to adjust/reposition and repeated sitting up, but pt unable to tolerate and continuing to have difficulty breathing. Pt reports the brace was adjusted yesterday by orthotist, but she was only in supine. RN notified and unable to adjust appropriately, so called appropriate department for brace to be adjusted with recommendation to check pt in seated position as this is when she is having difficulty breathing.   Focused on supine therex and ROM for functional strengthening/stretching in all planes; slight tone noted with LLE PROM. Bridging for strengthening and to aid with bed mobility and transfers x 10 reps x 2 sets each with significant improvement in clearing her bottom off the bed. Hand coordination task to open her mail while in bed with HOB slightly elevated when RT entered session. Pt apologizing for not being able to do more this morning, but reassured pt that is was the issue with the brace and not her participation. Positioned in bed with HOB elevated to tolerance.  Therapy Documentation Precautions:  Precautions Precautions: Cervical;Fall Precaution Comments:   Required Braces or Orthoses: Other Brace/Splint Other Brace/Splint: Cervical hard collar  with thoracic brace component Restrictions Weight Bearing Restrictions: No  Pain: Pain Assessment Pain Assessment: No/denies pain Pain Score: 0-No pain  See FIM for current functional status  Therapy/Group: Individual Therapy and Co-Treatment (30 min with recreational therapy)  [name] [facility] 04/01/2013, 12:05 PM

## 2013-04-01 NOTE — Progress Notes (Signed)
Occupational Therapy Session Note  Patient Details  Name: Cassandra Walker MRN: [id number] Date of Birth: November 06, 1931  Today's Date: 04/01/2013  Short Term Goals: Week 1:  OT Short Term Goal 1 (Week 1): Patient will hold toothpaste, unscrew toothpaste lid, and put toothpaste onto toothbrush independently OT Short Term Goal 1 - Progress (Week 1): Met OT Short Term Goal 2 (Week 1): Patient will wash hands and wash face holding wash cloth with set-up assist OT Short Term Goal 2 - Progress (Week 1): Met OT Short Term Goal 3 (Week 1): Patient will maintain dynamic sitting balance, for at least 10 minutes, edge of bed with minimal assistance in order to work on ADL retraining OT Short Term Goal 3 - Progress (Week 1): Met OT Short Term Goal 4 (Week 1): BUE HEP will be introduced to patient  OT Short Term Goal 4 - Progress (Week 1): Met OT Short Term Goal 5 (Week 1): Patient will perform self-feeding task with minimal assistance using AE prn OT Short Term Goal 5 - Progress (Week 1): Met Week 2:  OT Short Term Goal 1 (Week 2): Patient will perform [facility] transfer using slide board with total assistX1 OT Short Term Goal 2 (Week 2): Patient will consistenly be set-up assist for self-feeding tasks using built up utensils prn OT Short Term Goal 3 (Week 2): Patient will complete UB dressing with moderate assistance OT Short Term Goal 4 (Week 2): Patient will recall HEP using theraputty with min verbal cues OT Short Term Goal 5 (Week 2): Tub/shower transfer using tub transfer bench will be attempted with patient  Skilled Therapeutic Interventions/Progress Updates:  Session Note #1 Time: 0905-1000 (55 mins) Pt with no c/o pain.  Upon entering room, pt supine in bed. Pt from supine->EOB to perform dressing/bathing ADL'S which focused on the pt's dynamic sitting balance/endurance/tolerance. While sitting EOB pt frequently c/o neck brace "choking" her and would lean to R side to alleviate this. Pt completed LB  dressing/bathing w/ AE (long-handled sponge, dressing stick). During LB dressing, pt used lat leans to assist in donning of pants. After dressing and bathing task, pt EOB->supine. TED hose were applied to BLE's. At end of tx session, pt in "chair" position in bed with soft call bell under R hand.  Session Note #2 Time: 1345-1415 (30 mins) Pt with no c/o pain.  Upon entering room, pt supine in bed. Pt from supine->EOB. Once EOB pt reported she felt like she was choking d/t the position of the neck brace. Pt reported she had been unable to get OOB all day d/t the brace. Pt moved back to the supine position. Pt brushed hair w/ built-up handle. During grooming task, pt stated she had a bowel mvmt and OT doffed diaper and cleaned pt up. Pt assisted in this task by rolling side<> side for doffing/donning of diaper. Also pt assisted with pulling up pants by bridging hips. Nrsg notified of neck brace and small blister on L back hip. At end of tx session, pt supine in bed with soft call bell under R hand.  Therapy Documentation Precautions:  Precautions Precautions: Cervical;Fall Precaution Comments:   Required Braces or Orthoses: Other Brace/Splint Other Brace/Splint: Cervical hard collar with thoracic brace component Restrictions Weight Bearing Restrictions: No  See FIM for current functional status  Therapy/Group: Individual Therapy  Marwan Lipe 04/01/2013, 7:16 AM

## 2013-04-02 ENCOUNTER — Inpatient Hospital Stay (HOSPITAL_COMMUNITY): Payer: Medicare Other | Admitting: Occupational Therapy

## 2013-04-02 ENCOUNTER — Inpatient Hospital Stay (HOSPITAL_COMMUNITY): Payer: Medicare Other

## 2013-04-02 ENCOUNTER — Inpatient Hospital Stay (HOSPITAL_COMMUNITY): Payer: Medicare Other | Admitting: *Deleted

## 2013-04-02 NOTE — Progress Notes (Signed)
Patient gets suppository q day at 6 am.  LBM 04/02/13 with small result after digital stimulation.

## 2013-04-02 NOTE — Progress Notes (Signed)
Weekly progress note and daily notes reviewed and accurately reflect weekly progress and treatment sessions.

## 2013-04-02 NOTE — Progress Notes (Signed)
Subjective/Complaints: Both of her arms/neck hurt today. Feels nauseas, feels like she's about to have a bm.  A 12 point review of systems has been performed and if not noted above is otherwise negative.   Objective: Vital Signs: Blood pressure 115/60, pulse 61, temperature 98.3 F (36.8 C), temperature source Oral, resp. rate 19, height 5' 5.5" (1.664 m), weight 73.6 kg (162 lb 4.1 oz), SpO2 95.00%. No results found.  Recent Labs  04/01/13 0543  WBC 6.0  HGB 10.5*  HCT 32.0*  PLT 400    Recent Labs  03/31/13 0515 04/01/13 0543  NA  --  139  K  --  4.1  CL  --  97  GLUCOSE  --  101*  BUN  --  41*  CREATININE 1.27* 1.17*  CALCIUM  --  9.0   CBG (last 3)  No results found for this basename: GLUCAP,  in the last 72 hours  Wt Readings from Last 3 Encounters:  03/28/13 73.6 kg (162 lb 4.1 oz)  03/09/13 76.2 kg (167 lb 15.9 oz)  03/09/13 76.2 kg (167 lb 15.9 oz)    Physical Exam:  Constitutional: She is oriented to person, place, and time. She appears well-developed and well-nourished.  HENT:  Head: Normocephalic and atraumatic.  Right Ear: External ear normal.  Left Ear: External ear normal.  Eyes: Conjunctivae and EOM are normal. Pupils are equal, round, and reactive to light. Right eye exhibits no discharge. Left eye exhibits no discharge. No scleral icterus.  Neck: Normal range of motion. No JVD present. No tracheal deviation present. No thyromegaly present.  Cardiovascular: Normal rate and regular rhythm.  Pulmonary/Chest: Effort normal and breath sounds normal. No respiratory distress. She has no wheezes.  Abdominal: Soft. She exhibits no distension. Bowel sounds are decreased. There is no tenderness.  Musculoskeletal: She exhibits tenderness. She exhibits no edema. Left shoulder tenderness fairly non-specific. More tender with ext rotation than internal. Pain along subacromial space perhaps a little more than other areas. Bilateral hands with Dupuytren's  contractures. Missing 2nd finger of left hand.  No consistent provocation of pain with manip of shoulder Lymphadenopathy: She has no cervical adenopathy.  Neurological: She is alert and oriented to person, place, and time.  DTRs diminished. Decreased LT to above chest. MOTOR TESTING:           Muscle  Right Left  Deltoid  4- 4 Triceps 2+ 2 Biceps  4 4 Wrist ext 3+ 3 HI  Tr-1 0 HF  2+ tr KE  3- Tr-1 ADF  3-4 1 APF  3-4 1   Psychiatric: She has a normal mood and affect. Her behavior is normal. Judgment and thought content normal.   Assessment/Plan: 1. 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 Physiatrist is providing close team supervision and 24 hour management of active medical problems listed below. Physiatrist and rehab team continue to assess barriers to discharge/monitor patient progress toward functional and medical goals. FIM: FIM - Bathing Bathing Steps Patient Completed: Chest;Right Arm;Left Arm;Abdomen;Right upper leg;Left upper leg;Right lower leg (including foot);Left lower leg (including foot) Bathing: 4: Min-Patient completes 8-9 89f 10 parts or 75+ percent  FIM - Upper Body Dressing/Undressing Upper body dressing/undressing steps patient completed: Thread/unthread left sleeve of pullover shirt/dress;Thread/unthread right sleeve of pullover shirt/dresss Upper body dressing/undressing: 3: Mod-Patient completed 50-74% of tasks FIM - Lower Body Dressing/Undressing Lower body dressing/undressing: 1:  Total-Patient completed less than 25% of tasks  FIM - Hotel manager Devices: Grab bar or rail for support Toileting: 0: Activity did not occur  FIM - Diplomatic Services operational officer Devices: Human resources officer Transfers: 0-Activity did not occur  FIM - Banker  Devices: Bed rails Bed/Chair Transfer: 3: Supine > Sit: Mod A (lifting assist/Pt. 50-74%/lift 2 legs;3: Sit > Supine: Mod A (lifting assist/Pt. 50-74%/lift 2 legs)  FIM - Locomotion: Wheelchair Distance: 100 feet Locomotion: Wheelchair: 2: Travels 50 - 149 ft with minimal assistance (Pt.>75%) FIM - Locomotion: Ambulation Locomotion: Ambulation: 0: Activity did not occur  Comprehension Comprehension Mode: Auditory Comprehension: 5-Understands complex 90% of the time/Cues < 10% of the time  Expression Expression Mode: Verbal Expression: 5-Expresses complex 90% of the time/cues < 10% of the time  Social Interaction Social Interaction: 7-Interacts appropriately with others - No medications needed.  Problem Solving Problem Solving: 5-Solves basic 90% of the time/requires cueing < 10% of the time  Memory Memory: 5-Recognizes or recalls 90% of the time/requires cueing < 10% of the time Medical Problem List and Plan:  1. DVT Prophylaxis/Anticoagulation: Pharmaceutical: Lovenox  2. Pain Management: tramadol, oxy IR, mobic. Increase neurontin to 200mg  tid for neuro pain. Scheduled meds with therapies   3. Mood: No signs of distress. Reports generalized pain but attempting to deal with this. egosupport 4. Neuropsych: This patient is capable of making decisions on her own behalf.  5. Neurogenic bowel and bladder: requiring I and O caths    -rx'ed UTI  -working on am bowel routine, off miralax  -having daily bm's--if nausea persistent, then check kub 6. HTN: monitor with bid checks. Continue Lisinopril, tenormin  -hctz stopped due to increased BUN 7. Leukocytosis--WBC's normal    LOS (Days) 16 A FACE TO FACE EVALUATION WAS PERFORMED  Crosby Oriordan T 04/02/2013 8:29 AM

## 2013-04-02 NOTE — Progress Notes (Signed)
Physical Therapy Session Note  Patient Details  Name: Cassandra Walker MRN: [id number] Date of Birth: 12-07-1931  Today's Date: 04/02/2013 Time: 4098-1191 Time Calculation (min): 35 min  Short Term Goals: Week 2:  PT Short Term Goal 1 (Week 2): Pt will be able to complete basic level slide board transfer bed <-> w/c with max  A of 1 PT Short Term Goal 1 - Progress (Week 2): Met PT Short Term Goal 2 (Week 2): Pt will be able to reposition in w/c with min A (scoot hip back after transfer) PT Short Term Goal 2 - Progress (Week 2): Partly met PT Short Term Goal 3 (Week 2): Pt will be able to demonstrate dynamic sitting balance for functional tasks with min A PT Short Term Goal 3 - Progress (Week 2): Met  Skilled Therapeutic Interventions/Progress Updates:    Pt reports not feeling well this morning c/o nausea, stomach pain and difficulty moving bowels. Upon initiation of treatment session PT reported pt attempted bowel movement on bedpan but unable to go.  Pt did not want to sit at EOB due to nausea but did agree to perform some exercises in supine. Pt performed B LE there ex in supine AROM R LE and AAROM L LE x 10 reps for ankle pumps with 5 second contract and hold for DF, heel slides and abduction. Pt also performed bed mobility rolling side to side for nursing to check bowels, per Nsg pt not impacted and she had 2 bowel movements yesterday. Roll side to side pt required mod assist with assistance to bend L LE and mod vcs for proper sequencing, pt put on bedpan and reported she was beginning to go. Pt unable to complete treatment as she remained on the bedpan and nursing left to attend to pt.   Therapy Documentation Precautions:  Precautions Precautions: Cervical;Fall Precaution Comments:   Required Braces or Orthoses: Other Brace/Splint Other Brace/Splint: Cervical hard collar with thoracic brace component Restrictions Weight Bearing Restrictions: No General: Amount of Missed PT Time  (min): 25 Minutes Missed Time Reason: Nursing care Pain: Pt reports 4/10 painscore.            See FIM for current functional status  Therapy/Group: Individual Therapy  Jackelyn Knife 04/02/2013, 9:55 AM

## 2013-04-02 NOTE — Progress Notes (Signed)
Recreational Therapy Session Note  Patient Details  Name: Cassandra Walker MRN: [id number] Date of Birth: Mar 17, 1932 Today's Date: 04/02/2013  Session cancelled due to c/o nausea & constipation.  RN aware. Yuriana Gaal 04/02/2013, 10:21 AM

## 2013-04-02 NOTE — Progress Notes (Signed)
Occupational Therapy Weekly Progress Note & Session Notes  Patient Details  Name: JAYANA KOTULA MRN: [id number] Date of Birth: 1932-08-02  Today's Date: 04/02/2013  Weekly Progress Note: Patient has met 4 of 4 short term goals. Pt continues to make slow progress on CIR and lacks carry-over from previous tx sessions. Currently, the pt is total assist for fxal transfers using the slide board. The pt requires min-mod assist for UB dressing, total assist for LB dressing, set-up I for feeding, and min assist for grooming. The pt's status fluctuates day to day with c/o nausea and pain affecting therapy treatments. No family has been present for education. Family is contemplating D/C home vs SNF at this time.  Patient continues to demonstrate the following deficits: decreased dynamic sitting balance/endurance, decreased independence with BADL's, decreased fxal endurance/activity tolerance, decreased FM skills/coordination, decreased UB/LB strength, decreased postural control, decreased fxal use of BUE's and pt is limited secondary to pain and therefore will continue to benefit from skilled OT intervention to enhance overall performance with BADL and Reduce care partner burden.  Patient not progressing toward long term goals.  See goal revision..  Plan of care revisions: See care plan/paths.. Discharged tub transfer and toileting goals d/t pt's slow progress with fxal transfers, fxal use of BUE's, and lack of carry-over.  OT Short Term Goals Week 1:  OT Short Term Goal 1 (Week 1): Patient will hold toothpaste, unscrew toothpaste lid, and put toothpaste onto toothbrush independently OT Short Term Goal 1 - Progress (Week 1): Met OT Short Term Goal 2 (Week 1): Patient will wash hands and wash face holding wash cloth with set-up assist OT Short Term Goal 2 - Progress (Week 1): Met OT Short Term Goal 3 (Week 1): Patient will maintain dynamic sitting balance, for at least 10 minutes, edge of bed with minimal  assistance in order to work on ADL retraining OT Short Term Goal 3 - Progress (Week 1): Met OT Short Term Goal 4 (Week 1): BUE HEP will be introduced to patient  OT Short Term Goal 4 - Progress (Week 1): Met OT Short Term Goal 5 (Week 1): Patient will perform self-feeding task with minimal assistance using AE prn OT Short Term Goal 5 - Progress (Week 1): Met Week 2:  OT Short Term Goal 1 (Week 2): Patient will perform BSC transfer using slide board with total assistX1 OT Short Term Goal 1 - Progress (Week 2): Met OT Short Term Goal 2 (Week 2): Patient will consistenly be set-up assist for self-feeding tasks using built up utensils prn OT Short Term Goal 2 - Progress (Week 2): Met OT Short Term Goal 3 (Week 2): Patient will complete UB dressing with moderate assistance OT Short Term Goal 3 - Progress (Week 2): Met OT Short Term Goal 4 (Week 2): Patient will recall HEP using theraputty with min verbal cues OT Short Term Goal 4 - Progress (Week 2): Met OT Short Term Goal 5 (Week 2): Tub/shower transfer using tub transfer bench will be attempted with patient OT Short Term Goal 5 - Progress (Week 2): Met Week 3:  OT Short Term Goal 1 (Week 3): Pt will complete UB dressing with min A consistently. OT Short Term Goal 2 (Week 3): Pt will complete BSC transfer using slide board with max A. OT Short Term Goal 3 (Week 3): Pt will maintain dynamic sitting balance during fxal activity with steady assist for 15 mins. OT Short Term Goal 4 (Week 3): Pt will be mod I  with bridging and rolling side<>side in bed to decrease caregiver burden during LB dressing. OT Short Term Goal 5 (Week 3): Pt will complete 4/5 grooming tasks after set-up.  Skilled Therapeutic Interventions/Progress Updates:  Session Note #1: Individual Therapy Time: 0730-0750 (20 mins) Pt missed 40 mins of skilled therapy time. Pain: Pt reported pain all over but no rate given.   Upon entering room, pt supine in bed with MD present. While  speaking with MD, pt reported she felt nauseas and felt she was going to vomit. Pt also stated she felt she was going to have a bowel mvmt. Pt stated she was too weak to transfer->BSC and bed pan was placed under pt instead. Pt rolled side<>side to assist with this. OT checked back in on pt 30 mins later and pt reported she had not had a bowel mvmt and still felt nauseas. Nrsg notified of this. Pt missed 40 mins of skilled therapy time secondary to feeling nausea and weak. Pt left supine in chair position in bed and seated on bed pan. Soft call bell placed under L wrist. Nrsg notified of this.  Session Note #2: Individual Therapy Time: 1310-1400 (50 mins) Pt with no c/o pain.  Upon entering room, pt supine in bed. Nrsg stated needed urine sample by 1330 and collected sample using in and out cath. Pt missed 10 mins of skilled therapy secondary to nrsg time. Following this, OT donned pt's pants and pt assisted by bridging hips. Pt->EOB and OT donned shirt. OT completed dressing task secondary to decreased time. Pt transferred bed->W/C using slide board (2 person transfer; 1 person for maintaining board placement). Pt pushed to ADL apt to perform simulated W/C<> tub bench transfer using squat pivot. Next, pt completed checkers game seated in W/C. Skilled intervention focused on fxal use of BUE's, BUE ROM, postural control, dynamic sitting balance/endurance, FM skills/coordination, fxal transfers, and overall fxal endurance. At end of tx session, pt seated in W/C in room. Pt stated she felt nauseas and nrsg notified. Soft call bell left under L wrist.  Therapy Documentation Precautions:  Precautions Precautions: Cervical;Fall Precaution Comments:   Required Braces or Orthoses: Other Brace/Splint Other Brace/Splint: Cervical hard collar with thoracic brace component Restrictions Weight Bearing Restrictions: No  See FIM for current functional status   Jonessa Triplett 04/02/2013, 3:43 PM

## 2013-04-03 ENCOUNTER — Encounter (HOSPITAL_COMMUNITY): Payer: Medicare Other | Admitting: Occupational Therapy

## 2013-04-03 ENCOUNTER — Inpatient Hospital Stay (HOSPITAL_COMMUNITY): Payer: Medicare Other | Admitting: Physical Therapy

## 2013-04-03 DIAGNOSIS — IMO0002 Reserved for concepts with insufficient information to code with codable children: Secondary | ICD-10-CM

## 2013-04-03 DIAGNOSIS — N319 Neuromuscular dysfunction of bladder, unspecified: Secondary | ICD-10-CM

## 2013-04-03 DIAGNOSIS — K592 Neurogenic bowel, not elsewhere classified: Secondary | ICD-10-CM

## 2013-04-03 DIAGNOSIS — W19XXXA Unspecified fall, initial encounter: Secondary | ICD-10-CM

## 2013-04-03 MED ORDER — DICLOFENAC SODIUM 1 % TD GEL
2.0000 g | Freq: Four times a day (QID) | TRANSDERMAL | Status: DC
  Administered 2013-04-03 – 2013-04-09 (×24): 2 g via TOPICAL
  Filled 2013-04-03: qty 100

## 2013-04-03 MED ORDER — SENNA 8.6 MG PO TABS
1.0000 | ORAL_TABLET | Freq: Every day | ORAL | Status: DC
  Administered 2013-04-03 – 2013-04-07 (×5): 8.6 mg via ORAL
  Filled 2013-04-03 (×7): qty 1

## 2013-04-03 MED ORDER — GABAPENTIN 100 MG PO CAPS
200.0000 mg | ORAL_CAPSULE | Freq: Three times a day (TID) | ORAL | Status: DC
  Administered 2013-04-03 – 2013-04-08 (×16): 200 mg via ORAL
  Filled 2013-04-03 (×21): qty 2

## 2013-04-03 MED ORDER — METHOCARBAMOL 500 MG PO TABS
500.0000 mg | ORAL_TABLET | Freq: Four times a day (QID) | ORAL | Status: DC
  Administered 2013-04-03 – 2013-04-08 (×19): 500 mg via ORAL
  Filled 2013-04-03 (×27): qty 1

## 2013-04-03 NOTE — Progress Notes (Signed)
Subjective/Complaints: Pain better today. Slept better. Denies nausea. Moved bowels yesterday. Family brought in voltaren gel as well which was helpful for her hands. A 12 point review of systems has been performed and if not noted above is otherwise negative.   Objective: Vital Signs: Blood pressure 142/76, pulse 66, temperature 97.8 F (36.6 C), temperature source Oral, resp. rate 18, height 5' 5.5" (1.664 m), weight 73.6 kg (162 lb 4.1 oz), SpO2 99.00%. No results found.  Recent Labs  04/01/13 0543  WBC 6.0  HGB 10.5*  HCT 32.0*  PLT 400    Recent Labs  04/01/13 0543  NA 139  K 4.1  CL 97  GLUCOSE 101*  BUN 41*  CREATININE 1.17*  CALCIUM 9.0   CBG (last 3)  No results found for this basename: GLUCAP,  in the last 72 hours  Wt Readings from Last 3 Encounters:  03/28/13 73.6 kg (162 lb 4.1 oz)  03/09/13 76.2 kg (167 lb 15.9 oz)  03/09/13 76.2 kg (167 lb 15.9 oz)    Physical Exam:  Constitutional: She is oriented to person, place, and time. She appears well-developed and well-nourished.  HENT:  Head: Normocephalic and atraumatic.  Right Ear: External ear normal.  Left Ear: External ear normal.  Eyes: Conjunctivae and EOM are normal. Pupils are equal, round, and reactive to light. Right eye exhibits no discharge. Left eye exhibits no discharge. No scleral icterus.  Neck: Normal range of motion. No JVD present. No tracheal deviation present. No thyromegaly present.  Cardiovascular: Normal rate and regular rhythm.  Pulmonary/Chest: Effort normal and breath sounds normal. No respiratory distress. She has no wheezes.  Abdominal: Soft. She exhibits no distension. Bowel sounds are decreased. There is no tenderness.  Musculoskeletal: She exhibits tenderness. She exhibits no edema. Left shoulder tenderness fairly non-specific. More tender with ext rotation than internal. Pain along subacromial space perhaps a little more than other areas. Bilateral hands with Dupuytren's  contractures. Missing 2nd finger of left hand.  No consistent provocation of pain with manip of shoulder Lymphadenopathy: She has no cervical adenopathy.  Neurological: She is alert and oriented to person, place, and time.  DTRs diminished. Decreased LT to above chest. MOTOR TESTING:           Muscle  Right Left  Deltoid  4- 4 Triceps 2+ 2 Biceps  4 4 Wrist ext 1-2 1 HI  Tr-1 1 HF  2+ tr KE  3- Tr-1 ADF  3-4 1 APF  3-4 1   Psychiatric: She has a normal mood and affect. Her behavior is normal. Judgment and thought content normal.   Assessment/Plan: 1. 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 Physiatrist is providing close team supervision and 24 hour management of active medical problems listed below. Physiatrist and rehab team continue to assess barriers to discharge/monitor patient progress toward functional and medical goals. FIM: FIM - Bathing Bathing Steps Patient Completed: Chest;Right Arm;Left Arm;Abdomen;Right upper leg;Left upper leg;Right lower leg (including foot);Left lower leg (including foot) Bathing: 4: Min-Patient completes 8-9 54f 10 parts or 75+ percent  FIM - Upper Body Dressing/Undressing Upper body dressing/undressing steps patient completed: Thread/unthread left sleeve of pullover shirt/dress;Thread/unthread right sleeve of pullover shirt/dresss Upper body dressing/undressing: 3: Mod-Patient completed 50-74% of tasks FIM - Lower Body Dressing/Undressing Lower body dressing/undressing: 1: Total-Patient completed less than 25% of tasks  FIM - Hotel manager Devices: Grab  bar or rail for support Toileting: 0: Activity did not occur  FIM - Diplomatic Services operational officer Devices: Human resources officer Transfers: 0-Activity did not occur  FIM - Banker  Devices: Bed rails;Sliding board Bed/Chair Transfer: 2: Supine > Sit: Max A (lifting assist/Pt. 25-49%);1: Two helpers (2 helpers bed->chair )  FIM - Locomotion: Wheelchair Distance: 100 feet Locomotion: Wheelchair: 2: Travels 50 - 149 ft with minimal assistance (Pt.>75%) FIM - Locomotion: Ambulation Locomotion: Ambulation: 0: Activity did not occur  Comprehension Comprehension Mode: Auditory Comprehension: 5-Understands basic 90% of the time/requires cueing < 10% of the time  Expression Expression Mode: Verbal Expression: 5-Expresses basic 90% of the time/requires cueing < 10% of the time.  Social Interaction Social Interaction: 7-Interacts appropriately with others - No medications needed.  Problem Solving Problem Solving: 5-Solves basic 90% of the time/requires cueing < 10% of the time  Memory Memory: 5-Recognizes or recalls 90% of the time/requires cueing < 10% of the time Medical Problem List and Plan:  1. DVT Prophylaxis/Anticoagulation: Pharmaceutical: Lovenox  2. Pain Management: tramadol, oxy IR. Increased neurontin to 200mg  tid for neuro pain. Scheduled meds with therapies  -liked the voltaren gel  -dc mobic  3. Mood: No signs of distress. Reports generalized pain but attempting to deal with this. egosupport 4. Neuropsych: This patient is capable of making decisions on her own behalf.  5. Neurogenic bowel and bladder: requiring I and O caths    -rx'ed UTI  -working on am bowel routine, off miralax  -having daily bm's--if nausea persistent, then check kub 6. HTN: monitor with bid checks. Continue Lisinopril, tenormin  -hctz stopped due to increased BUN  -recheck Monday bmet 7. Leukocytosis--WBC's normal    LOS (Days) 17 A FACE TO FACE EVALUATION WAS PERFORMED  Joneric Streight T 04/03/2013 8:35 AM

## 2013-04-03 NOTE — Progress Notes (Signed)
Physical Therapy Session Note  Patient Details  Name: Cassandra Walker MRN: [id number] Date of Birth: 11/27/1931  Today's Date: 04/03/2013 Time: 1478-2956 Time Calculation (min): 36 min  Short Term Goals: Week 3:  PT Short Term Goal 1 (Week 3): = LTGs  Skilled Therapeutic Interventions/Progress Updates:    This session focused on bed mobility rolling right with mod assist and use of bed rail hooking with her left forearm.  Right side lying to sit with mod assist to help support trunk during transition.  Pt initiating well, but not strong enough in arms or trunk to get all the way up to sitting without assist.  Seated, pt needed help adjusting her brace down so that it was not choking her.  Seated exercises: LAQs, heel raises/toe raises, hip adduction, seated hip flexion bil x 10 each.  Seated reaching activity and static sitting balance during functional activity of hand over hand cup hold to take pills and drink milkshake her daughter brought her.   She did have some fleeting reports of nausea which seemed to pass after she burped twice.  Sit to side lying right mod assist of both legs.  Scoot up to [facility] with bed in trendelenburg and pt assisted and stabilized in bridge.  She was able to help scoot herself up with mod assist.    Therapy Documentation Precautions:  Precautions Precautions: Cervical;Fall Precaution Comments:   Required Braces or Orthoses: Other Brace/Splint Other Brace/Splint: Cervical hard collar with thoracic brace component Restrictions Weight Bearing Restrictions: No   Vital Signs: Therapy Vitals Temp: 98 F (36.7 C) Temp src: Oral Pulse Rate: 61 Resp: 18 BP: 123/68 mmHg Patient Position, if appropriate: Lying Oxygen Therapy SpO2: 97 % O2 Device: None (Room air)  See FIM for current functional status  Therapy/Group: Individual Therapy  Cassandra Walker, PT, DPT [phone]   04/03/2013, 4:26 PM

## 2013-04-03 NOTE — Progress Notes (Signed)
Occupational Therapy Session Note  Patient Details  Name: Cassandra Walker MRN: [id number] Date of Birth: [birth year]  Today's Date: 04/03/2013  Short Term Goals: Week 1:  OT Short Term Goal 1 (Week 1): Patient will hold toothpaste, unscrew toothpaste lid, and put toothpaste onto toothbrush independently OT Short Term Goal 1 - Progress (Week 1): Met OT Short Term Goal 2 (Week 1): Patient will wash hands and wash face holding wash cloth with set-up assist OT Short Term Goal 2 - Progress (Week 1): Met OT Short Term Goal 3 (Week 1): Patient will maintain dynamic sitting balance, for at least 10 minutes, edge of bed with minimal assistance in order to work on ADL retraining OT Short Term Goal 3 - Progress (Week 1): Met OT Short Term Goal 4 (Week 1): BUE HEP will be introduced to patient  OT Short Term Goal 4 - Progress (Week 1): Met OT Short Term Goal 5 (Week 1): Patient will perform self-feeding task with minimal assistance using AE prn OT Short Term Goal 5 - Progress (Week 1): Met Week 2:  OT Short Term Goal 1 (Week 2): Patient will perform BSC transfer using slide board with total assistX1 OT Short Term Goal 1 - Progress (Week 2): Met OT Short Term Goal 2 (Week 2): Patient will consistenly be set-up assist for self-feeding tasks using built up utensils prn OT Short Term Goal 2 - Progress (Week 2): Met OT Short Term Goal 3 (Week 2): Patient will complete UB dressing with moderate assistance OT Short Term Goal 3 - Progress (Week 2): Met OT Short Term Goal 4 (Week 2): Patient will recall HEP using theraputty with min verbal cues OT Short Term Goal 4 - Progress (Week 2): Met OT Short Term Goal 5 (Week 2): Tub/shower transfer using tub transfer bench will be attempted with patient OT Short Term Goal 5 - Progress (Week 2): Met Week 3:  OT Short Term Goal 1 (Week 3): Pt will complete UB dressing with min A consistently. OT Short Term Goal 2 (Week 3): Pt will complete BSC transfer using slide board  with max A. OT Short Term Goal 3 (Week 3): Pt will maintain dynamic sitting balance during fxal activity with steady assist for 15 mins. OT Short Term Goal 4 (Week 3): Pt will be mod I with bridging and rolling side<>side in bed to decrease caregiver burden during LB dressing. OT Short Term Goal 5 (Week 3): Pt will complete 4/5 grooming tasks after set-up.  Skilled Therapeutic Interventions/Progress Updates:     Individual Therapy Time: 1100-1205 (55 mins tx time) Missed 10 minutes of skilled therapy secondary to nrsg cath schedule. Pt reported 6/10 pain all over. Nrsg present when pt reported this.  Upon entering room, pt supine in bed. Pt engaged in LB bathing using AE @ bed level. Pt reported she was going to have a bowel mvmt and transferred->BSC using slide board (two person; 1 for board stabilization). From [facility], pt participated in peri hygiene and LB dressing using lat leans side<>side. Pt completed UB bathing/dressing @ BSC level. Pt from BSC->W/C using slide board (two person; 1 for board stabilization). Pt required mod v.c.'s for hand placement during transfers and max v.c.'s for leaning forward during transfer. OT cued pt to initiate hip mvmt during slide board transfer as well. PT encouraged to feed self during lunch using built-up utensils. Pt completed grooming in W/C @ sink. At end of tx session, pt seated in W/C with soft call bell within reach.  Therapy Documentation Precautions:  Precautions Precautions: Cervical;Fall Precaution Comments:   Required Braces or Orthoses: Other Brace/Splint Other Brace/Splint: Cervical hard collar with thoracic brace component Restrictions Weight Bearing Restrictions: No  See FIM for current functional status    Maryanna Stuber 04/03/2013, 12:47 PM

## 2013-04-03 NOTE — Progress Notes (Signed)
Physical Therapy Note  Patient Details  Name: Cassandra Walker MRN: [id number] Date of Birth: 1932/04/22 Today's Date: 04/03/2013  Time: 330 [id number] 56 minutes  1:1 Pt c/o pain in B hands, RN made aware and meds rec'd.  Pt on bedpan upon PT arrival.  Pt able to perform rolling for LB dressing and hygiene with mod A with hand rails, tactile cues at trunk.  Supine to sit with mod A with pt able to assist with lifting trunk with UEs.  Sliding board transfers multiple attempts bed <> w/c and mat <> w/c for activity tolerance, wt shifts and strengthening with mod-max A with manual facilitation for anterior wt shifts, cues for LE and UE positioning.  W/c mobility with B UEs with supervision with pt able to propel 100' before requiring a rest break.  Standing 2 x 1 minute in EVA walker with +2 assist to stand, mod A for standing balance once standing.  Pt states she feels she is going to "pass out" when standing, BP 89/57.  Seated balance training edge of mat with reaching out of BOS with min A for trunk control.  Pt with improving activity tolerance noted, good motivation to participate today.   Martena Emanuele 04/03/2013, 10:22 AM

## 2013-04-03 NOTE — Progress Notes (Signed)
This note has been reviewed and this clinician agrees with information provided.  

## 2013-04-04 ENCOUNTER — Inpatient Hospital Stay (HOSPITAL_COMMUNITY): Payer: Medicare Other | Admitting: Physical Therapy

## 2013-04-04 DIAGNOSIS — N390 Urinary tract infection, site not specified: Secondary | ICD-10-CM

## 2013-04-04 MED ORDER — PROMETHAZINE HCL 12.5 MG PO TABS
12.5000 mg | ORAL_TABLET | Freq: Four times a day (QID) | ORAL | Status: DC | PRN
  Administered 2013-04-04: 12.5 mg via ORAL
  Filled 2013-04-04 (×3): qty 1

## 2013-04-04 NOTE — Progress Notes (Signed)
Physical Therapy Note  Patient Details  Name: Cassandra Walker MRN: [id number] Date of Birth: June 16, 1932 Today's Date: 04/04/2013  1010- 1040 (30 minutes) individual (missed 15 minutes secondary to nausea/ dizziness) Pain: no reported pain Focus of treatment: Bilateral LE strengthening in supine Treatment: Pt in bed upon arrival; assist to thread slacks on LEs / pt able to roll right or left with bedrail min assist with c/o of nausea; attempted supine to sit x 2 with c/o increased nausea and potential to "pass out" (BP in supine 144/77); bilateral LE strengthening x 15 - quad sets , ankle pumps (AA on left); hip flexion/resisted extension ; hip abduction. Pt states she does not want to attempt to sit up at this time.   Rohail Klees,JIM 04/04/2013, 10:40 AM

## 2013-04-04 NOTE — Progress Notes (Signed)
Cassandra Walker is a 79 y.o. female 09/04/1932 [id number]  Subjective: No new complaints. No new problems. Slept well. Feeling OK.  Objective: Vital signs in last 24 hours: Temp:  [97.7 F (36.5 C)-98 F (36.7 C)] 97.7 F (36.5 C) (05/31 0615) Pulse Rate:  [61-63] 63 (05/31 0615) Resp:  [18] 18 (05/31 0615) BP: (123-146)/(65-68) 146/65 mmHg (05/31 0615) SpO2:  [97 %] 97 % (05/31 0615) Weight change:  Last BM Date: 04/03/13  Intake/Output from previous day: 05/30 0701 - 05/31 0700 In: 680 [P.O.:680] Out: 1250 [Urine:1250] Last cbgs: CBG (last 3)  No results found for this basename: GLUCAP,  in the last 72 hours   Physical Exam General: No apparent distress    HEENT: moist mucosa Lungs: Normal effort. Lungs clear to auscultation, no crackles or wheezes. Cardiovascular: Regular rate and rhythm, no edema Musculoskeletal:  No change from before Neurological: No new neurological deficits Wounds: N/A    Skin: clear Alert, cooperative   Lab Results: BMET    Component Value Date/Time   NA 139 04/01/2013 0543   K 4.1 04/01/2013 0543   CL 97 04/01/2013 0543   CO2 31 04/01/2013 0543   GLUCOSE 101* 04/01/2013 0543   BUN 41* 04/01/2013 0543   CREATININE 1.17* 04/01/2013 0543   CALCIUM 9.0 04/01/2013 0543   GFRNONAA 43* 04/01/2013 0543   GFRAA 50* 04/01/2013 0543   CBC    Component Value Date/Time   WBC 6.0 04/01/2013 0543   RBC 3.71* 04/01/2013 0543   HGB 10.5* 04/01/2013 0543   HCT 32.0* 04/01/2013 0543   PLT 400 04/01/2013 0543   MCV 86.3 04/01/2013 0543   MCH 28.3 04/01/2013 0543   MCHC 32.8 04/01/2013 0543   RDW 13.8 04/01/2013 0543   LYMPHSABS 2.9 03/18/2013 0725   MONOABS 1.5* 03/18/2013 0725   EOSABS 0.4 03/18/2013 0725   BASOSABS 0.1 03/18/2013 0725    Studies/Results: No results found.  Medications: I have reviewed the patient's current medications.  Assessment/Plan:   1. DVT Prophylaxis/Anticoagulation: Pharmaceutical: Lovenox  2. Pain Management: tramadol, oxy  IR. Increased neurontin to 200mg  tid for neuro pain. Scheduled meds with therapies  -liked the voltaren gel  -dc mobic  3. Mood: No signs of distress. Reports generalized pain but attempting to deal with this. egosupport  4. Neuropsych: This patient is capable of making decisions on her own behalf.  5. Neurogenic bowel and bladder: requiring I and O caths  -rx'ed UTI  -working on am bowel routine, off miralax  -having daily bm's--if nausea persistent, then check kub  6. HTN: monitor with bid checks. Continue Lisinopril, tenormin - doing well -hctz stopped due to increased BUN  -recheck Monday bmet  7. Leukocytosis--WBC's normal    Length of stay, days: 18  Sonda Primes , MD 04/04/2013, 8:46 AM

## 2013-04-04 NOTE — Plan of Care (Signed)
Problem: SCI BOWEL ELIMINATION Goal: RH STG MANAGE BOWEL WITH ASSISTANCE STG Manage Bowel with total Assistance.  Outcome: Progressing Bowel managed with AM bowel program

## 2013-04-05 ENCOUNTER — Inpatient Hospital Stay (HOSPITAL_COMMUNITY): Payer: Medicare Other | Admitting: Occupational Therapy

## 2013-04-05 DIAGNOSIS — R339 Retention of urine, unspecified: Secondary | ICD-10-CM

## 2013-04-05 NOTE — Progress Notes (Signed)
1505 Patient catheratized for 75 cc dark, amber urine.  Fluids encouraged at bedside.

## 2013-04-05 NOTE — Progress Notes (Signed)
Occupational Therapy Session Note  Patient Details  Name: Cassandra Walker MRN: [id number] Date of Birth: [birth year]  Today's Date: 04/05/2013 Time: 0930-1030 Time Calculation (min): 60 min  Skilled Therapeutic Interventions/Progress Updates: ADL in bed and Sliding board transfer Max to Total A x2 (2nd person today securing w/c).     ADL focus on LB b/d via long handled sponge and bridging up to don pants in supine.  LB b/d= max A     Patient able to complete UB b/d with overall S     Therapy Documentation Precautions:  Precautions Precautions: Cervical;Fall Precaution Comments:   Required Braces or Orthoses: Other Brace/Splint Other Brace/Splint: Cervical hard collar with thoracic brace component Restrictions Weight Bearing Restrictions: No   Pain:6/10 both forearms "hurt all the time and they are trying to reduce my medicine"    See FIM for current functional status  Therapy/Group: Individual Therapy  Bud Face [facility] 04/05/2013, 2:47 PM

## 2013-04-05 NOTE — Progress Notes (Signed)
In and out cath performed for result of 250 ml, cranberry in color, small blood clot noted, without odor.  Patient c/o nausea and dizziness with turning to right side.  States, "I've noticed that I usually get nauseated and dizzy when they turn me to the right side."

## 2013-04-05 NOTE — Progress Notes (Signed)
Cassandra Walker is a 79 y.o. female May 20, 1932 [id number]  Subjective: No new complaints. No new problems. Slept well. Feeling OK.  Objective: Vital signs in last 24 hours: Temp:  [97.9 F (36.6 C)-98.4 F (36.9 C)] 97.9 F (36.6 C) (06/01 0521) Pulse Rate:  [61-64] 64 (06/01 0521) Resp:  [18] 18 (06/01 0521) BP: (144-146)/(80-81) 144/81 mmHg (06/01 0521) SpO2:  [96 %-99 %] 96 % (06/01 0521) Weight change:  Last BM Date: 04/04/13  Intake/Output from previous day: 05/31 0701 - 06/01 0700 In: 360 [P.O.:360] Out: 1425 [Urine:1425] Last cbgs: CBG (last 3)  No results found for this basename: GLUCAP,  in the last 72 hours   Physical Exam General: No apparent distress    HEENT: moist mucosa Lungs: Normal effort. Lungs clear to auscultation, no crackles or wheezes. Cardiovascular: Regular rate and rhythm, no edema Musculoskeletal:  No change from before Neurological: No new neurological deficits Wounds: N/A    Skin: clear Alert, cooperative   Lab Results: BMET    Component Value Date/Time   NA 139 04/01/2013 0543   K 4.1 04/01/2013 0543   CL 97 04/01/2013 0543   CO2 31 04/01/2013 0543   GLUCOSE 101* 04/01/2013 0543   BUN 41* 04/01/2013 0543   CREATININE 1.17* 04/01/2013 0543   CALCIUM 9.0 04/01/2013 0543   GFRNONAA 43* 04/01/2013 0543   GFRAA 50* 04/01/2013 0543   CBC    Component Value Date/Time   WBC 6.0 04/01/2013 0543   RBC 3.71* 04/01/2013 0543   HGB 10.5* 04/01/2013 0543   HCT 32.0* 04/01/2013 0543   PLT 400 04/01/2013 0543   MCV 86.3 04/01/2013 0543   MCH 28.3 04/01/2013 0543   MCHC 32.8 04/01/2013 0543   RDW 13.8 04/01/2013 0543   LYMPHSABS 2.9 03/18/2013 0725   MONOABS 1.5* 03/18/2013 0725   EOSABS 0.4 03/18/2013 0725   BASOSABS 0.1 03/18/2013 0725    Studies/Results: No results found.  Medications: I have reviewed the patient's current medications.  Assessment/Plan:   1. DVT Prophylaxis/Anticoagulation: Pharmaceutical: Lovenox  2. Pain Management: tramadol,  oxy IR. Increased neurontin to 200mg  tid for neuro pain. Scheduled meds with therapies  -liked the voltaren gel  -dc mobic  3. Mood: No signs of distress. Reports generalized pain but attempting to deal with this. egosupport  4. Neuropsych: This patient is capable of making decisions on her own behalf.  5. Neurogenic bowel and bladder: requiring I and O caths - change to q8h -rx'ed UTI  -working on am bowel routine, off miralax  -having daily bm's--if nausea persistent, then check kub  6. HTN: monitor with bid checks. Continue Lisinopril, tenormin - doing well -hctz stopped due to increased BUN  -recheck Monday bmet  7. Leukocytosis--WBC's normal    Length of stay, days: 19  Sonda Primes , MD 04/05/2013, 8:34 AM

## 2013-04-05 NOTE — Progress Notes (Signed)
Only soft stool smear noted after supp and digital stimulation with bowel program this am.

## 2013-04-06 ENCOUNTER — Inpatient Hospital Stay (HOSPITAL_COMMUNITY): Payer: Medicare Other | Admitting: Occupational Therapy

## 2013-04-06 ENCOUNTER — Inpatient Hospital Stay (HOSPITAL_COMMUNITY): Payer: Medicare Other

## 2013-04-06 LAB — BASIC METABOLIC PANEL
BUN: 28 mg/dL — ABNORMAL HIGH (ref 6–23)
Calcium: 8.6 mg/dL (ref 8.4–10.5)
GFR calc Af Amer: 56 mL/min — ABNORMAL LOW (ref >90)
GFR calc non Af Amer: 48 mL/min — ABNORMAL LOW (ref >90)
Glucose, Bld: 104 mg/dL — ABNORMAL HIGH (ref 70–99)
Sodium: 133 mEq/L — ABNORMAL LOW (ref 135–145)

## 2013-04-06 NOTE — Plan of Care (Signed)
Problem: SCI BLADDER ELIMINATION Goal: RH STG MANAGE BLADDER WITH ASSISTANCE STG Manage Bladder With total Assistance. Requires in and out cath every 6-8 hours  Outcome: Progressing Requiring cath q 8hrs

## 2013-04-06 NOTE — Progress Notes (Signed)
Occupational Therapy Session Note  Patient Details  Name: Cassandra Walker MRN: [id number] Date of Birth: May 15, 1932  Today's Date: 04/06/2013 Time:  -     Short Term Goals: Week 1:  OT Short Term Goal 1 (Week 1): Patient will hold toothpaste, unscrew toothpaste lid, and put toothpaste onto toothbrush independently OT Short Term Goal 1 - Progress (Week 1): Met OT Short Term Goal 2 (Week 1): Patient will wash hands and wash face holding wash cloth with set-up assist OT Short Term Goal 2 - Progress (Week 1): Met OT Short Term Goal 3 (Week 1): Patient will maintain dynamic sitting balance, for at least 10 minutes, edge of bed with minimal assistance in order to work on ADL retraining OT Short Term Goal 3 - Progress (Week 1): Met OT Short Term Goal 4 (Week 1): BUE HEP will be introduced to patient  OT Short Term Goal 4 - Progress (Week 1): Met OT Short Term Goal 5 (Week 1): Patient will perform self-feeding task with minimal assistance using AE prn OT Short Term Goal 5 - Progress (Week 1): Met Week 2:  OT Short Term Goal 1 (Week 2): Patient will perform BSC transfer using slide board with total assistX1 OT Short Term Goal 1 - Progress (Week 2): Met OT Short Term Goal 2 (Week 2): Patient will consistenly be set-up assist for self-feeding tasks using built up utensils prn OT Short Term Goal 2 - Progress (Week 2): Met OT Short Term Goal 3 (Week 2): Patient will complete UB dressing with moderate assistance OT Short Term Goal 3 - Progress (Week 2): Met OT Short Term Goal 4 (Week 2): Patient will recall HEP using theraputty with min verbal cues OT Short Term Goal 4 - Progress (Week 2): Met OT Short Term Goal 5 (Week 2): Tub/shower transfer using tub transfer bench will be attempted with patient OT Short Term Goal 5 - Progress (Week 2): Met Week 3:  OT Short Term Goal 1 (Week 3): Pt will complete UB dressing with min A consistently. OT Short Term Goal 2 (Week 3): Pt will complete BSC transfer using  slide board with max A. OT Short Term Goal 3 (Week 3): Pt will maintain dynamic sitting balance during fxal activity with steady assist for 15 mins. OT Short Term Goal 4 (Week 3): Pt will be mod I with bridging and rolling side<>side in bed to decrease caregiver burden during LB dressing. OT Short Term Goal 5 (Week 3): Pt will complete 4/5 grooming tasks after set-up.  Skilled Therapeutic Interventions/Progress Updates:  Session Note: Time: 0835-0930 (55 mins) Pt with no report of pain.  Upon entering room, pt supine in bed with nrsg present. OT assisted nrsg in peri care after using bed pan. Pt reported she was not having a good day and felt nauseas. Pt completed LB bathing using AE while supine in bed. While rolling->R side for donning of diaper, pt reported feeling dizzy and thought she was going to "pass out". Nrsg notified of this.  After a few mins rest, pt rolled to R side for donning of diaper with no complaints of dizziness. Next, pt transferred from EOB->w/c using slide board (total A; 1 person). Pt brushed hair using build-up comb while seated in w/c. Multiple times throughout the session, pt c/o nausea. At end of tx session, pt seated in w/c with soft call bell within reach.  Therapy Documentation Precautions:  Precautions Precautions: Cervical;Fall Precaution Comments:   Required Braces or Orthoses: Other Brace/Splint Other  Brace/Splint: Cervical hard collar with thoracic brace component Restrictions Weight Bearing Restrictions: No  See FIM for current functional status  Therapy/Group: Individual Therapy  Pasqual Farias 04/06/2013, 7:13 AM

## 2013-04-06 NOTE — Progress Notes (Signed)
Small, brown stool smear noted after supp, digital stimulation with bowel program.

## 2013-04-06 NOTE — Progress Notes (Signed)
Note reviewed and accurately reflects treatment session.   

## 2013-04-06 NOTE — Progress Notes (Signed)
Subjective/Complaints: Nauseas, ?constipated A 12 point review of systems has been performed and if not noted above is otherwise negative.   Objective: Vital Signs: Blood pressure 110/60, pulse 60, temperature 98.1 F (36.7 C), temperature source Oral, resp. rate 17, height 5' 5.5" (1.664 m), weight 73.6 kg (162 lb 4.1 oz), SpO2 98.00%. No results found. No results found for this basename: WBC, HGB, HCT, PLT,  in the last 72 hours  Recent Labs  04/06/13 0535  NA 133*  K 3.9  CL 93*  GLUCOSE 104*  BUN 28*  CREATININE 1.07  CALCIUM 8.6   CBG (last 3)  No results found for this basename: GLUCAP,  in the last 72 hours  Wt Readings from Last 3 Encounters:  03/28/13 73.6 kg (162 lb 4.1 oz)  03/09/13 76.2 kg (167 lb 15.9 oz)  03/09/13 76.2 kg (167 lb 15.9 oz)    Physical Exam:  Constitutional: She is oriented to person, place, and time. She appears well-developed and well-nourished.  HENT:  Head: Normocephalic and atraumatic.  Right Ear: External ear normal.  Left Ear: External ear normal.  Eyes: Conjunctivae and EOM are normal. Pupils are equal, round, and reactive to light. Right eye exhibits no discharge. Left eye exhibits no discharge. No scleral icterus.  Neck: Normal range of motion. No JVD present. No tracheal deviation present. No thyromegaly present.  Cardiovascular: Normal rate and regular rhythm.  Pulmonary/Chest: Effort normal and breath sounds normal. No respiratory distress. She has no wheezes.  Abdominal: Soft. She exhibits no distension. Bowel sounds are decreased. There is no tenderness.  Musculoskeletal: She exhibits tenderness. She exhibits no edema. Left shoulder tenderness fairly non-specific. More tender with ext rotation than internal. Pain along subacromial space perhaps a little more than other areas. Bilateral hands with Dupuytren's contractures. Missing 2nd finger of left hand.  No consistent provocation of pain with manip of shoulder Lymphadenopathy:  She has no cervical adenopathy.  Neurological: She is alert and oriented to person, place, and time.  DTRs diminished. Decreased LT to above chest. MOTOR TESTING:           Muscle  Right Left  Deltoid  4- 4 Triceps 2+ 2 Biceps  4 4 Wrist ext 1-2 1 HI  Tr-1 1 HF  2+ tr KE  3- Tr-1 ADF  3-4 1 APF  3-4 1   Psychiatric: She has a normal mood and affect. Her behavior is normal. Judgment and thought content normal.   Assessment/Plan: 1. 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 Physiatrist is providing close team supervision and 24 hour management of active medical problems listed below. Physiatrist and rehab team continue to assess barriers to discharge/monitor patient progress toward functional and medical goals. FIM: FIM - Bathing Bathing Steps Patient Completed: Chest;Right Arm;Left Arm;Right upper leg;Left upper leg Bathing: 3: Mod-Patient completes 5-7 60f 10 parts or 50-74%  FIM - Upper Body Dressing/Undressing Upper body dressing/undressing steps patient completed: Thread/unthread right sleeve of pullover shirt/dresss;Thread/unthread left sleeve of pullover shirt/dress Upper body dressing/undressing: 4: Min-Patient completed 75 plus % of tasks FIM - Lower Body Dressing/Undressing Lower body dressing/undressing: 1: Total-Patient completed less than 25% of tasks  FIM - Hotel manager Devices: Grab bar or rail for support Toileting: 0: Activity did not occur  FIM - Diplomatic Services operational officer Devices: Human resources officer Transfers: 0-Activity did not occur  FIM -  Bed/Chair Water engineer Transfer: 1: Two helpers  FIM - Locomotion: Wheelchair Distance: 100 feet Locomotion: Wheelchair: 0: Activity did not occur FIM - Locomotion:  Ambulation Locomotion: Ambulation: 0: Activity did not occur  Comprehension Comprehension Mode: Auditory Comprehension: 5-Understands complex 90% of the time/Cues < 10% of the time  Expression Expression Mode: Verbal Expression: 5-Expresses complex 90% of the time/cues < 10% of the time  Social Interaction Social Interaction: 7-Interacts appropriately with others - No medications needed.  Problem Solving Problem Solving: 5-Solves complex 90% of the time/cues < 10% of the time  Memory Memory: 5-Recognizes or recalls 90% of the time/requires cueing < 10% of the time Medical Problem List and Plan:  1. DVT Prophylaxis/Anticoagulation: Pharmaceutical: Lovenox  2. Pain Management: tramadol, oxy IR. Increased neurontin to 200mg  tid for neuro pain. Scheduled meds with therapies  -liked the voltaren gel, taping, heat as well  -dc'ed mobic  3. Mood: No signs of distress. Reports generalized pain but attempting to deal with this. egosupport 4. Neuropsych: This patient is capable of making decisions on her own behalf.  5. Neurogenic bowel and bladder: requiring I and O caths    -rx'ed UTI  -working on am bowel routine, off miralax  -having daily bm's--if nausea persistent, then check kub 6. HTN: monitor with bid checks. Continue Lisinopril, tenormin  -hctz stopped due to increased BUN  -bmet better today  -follow i's and o's 7. Leukocytosis--WBC's normal    LOS (Days) 20 A FACE TO FACE EVALUATION WAS PERFORMED  SWARTZ,ZACHARY T 04/06/2013 8:50 AM

## 2013-04-06 NOTE — Progress Notes (Signed)
Physical Therapy Session Note  Patient Details  Name: Cassandra Walker MRN: [id number] Date of Birth: [birth year]  Today's Date: 04/06/2013 Time: 1030-1130 Time Calculation (min): 60 min  Short Term Goals: Week 3:  PT Short Term Goal 1 (Week 3): = LTGs  Skilled Therapeutic Interventions/Progress Updates:   Session focused on overall activity tolerance, functional transfers with slideboard (mod to max A) with emphasis on technique, and dynamic sitting balance edge of mat while completing mosaic tile puzzle with overall steady A to S for balance to work on fine motor control. Transferred back to bed end of session and positioned in supine.   Therapy Documentation Precautions:  Precautions Precautions: Cervical;Fall Precaution Comments:   Required Braces or Orthoses: Other Brace/Splint Other Brace/Splint: Cervical hard collar with thoracic brace component Restrictions Weight Bearing Restrictions: No Vital Signs: BP: 117/61 mmHg seated edge of mat with complaints of "clammy" feeling Pain:  Discomfort with brace - readjusted as needed. Reports nausea - RN gave pain medication prior to session.  See FIM for current functional status  Therapy/Group: Individual Therapy and Co-Treatment with Recreational Therapy  [name] [facility] 04/06/2013, 12:27 PM

## 2013-04-06 NOTE — Progress Notes (Signed)
Recreational Therapy Session Note  Patient Details  Name: SRISHTI STRNAD MRN: [id number] Date of Birth: 08-01-1932 Today's Date: 04/06/2013 Time:  1030-1130 Pain: c/o discomfort with brace, readjusted PRN for relief Skilled Therapeutic Interventions/Progress Updates: Pt with c/o nausea throughout session, RN aware & medicated pt prior to session. Session focused on slide board transfer, dynamic sitting balance EOM and fine motor use of BUE's.  Pt sitting EOM completing tabletop mosaic craft using BUE's with supervision-min assist.  Pt with c/o feeling "clammy", BP taken seated EOM BP:117/61 HR:71, cold rag applied to head and seated rest break helped to alleviate symptoms.  Therapy/Group: Co-Treatment  Jazmina Muhlenkamp 04/06/2013, 2:11 PM

## 2013-04-06 NOTE — Plan of Care (Signed)
Problem: RH PAIN MANAGEMENT Goal: RH STG PAIN MANAGED AT OR BELOW PT'S PAIN GOAL Less than 3  Outcome: Progressing Pt refusing scheduled pain medication

## 2013-04-06 NOTE — Progress Notes (Signed)
Physical Therapy Session Note  Patient Details  Name: INDICA MARCOTT MRN: [id number] Date of Birth: 1932/02/28  Today's Date: 04/06/2013 Time: 4696-2952 Time Calculation (min): 55 min  Short Term Goals: Week 3:  PT Short Term Goal 1 (Week 3): = LTGs  Skilled Therapeutic Interventions/Progress Updates:    Pt still with complaints of nausea and dizziness but willing to get OOB for therapy. Able to complete supine to sit with only min A for propped on elbow to sit. Focused on functional squat pivot transfers (required +2 assist at this time and cues for pushing through LE's and anterior weightshift) and sit to stands from edge of mat with [name] (x 2 reps with mod A +2 and min to mod A in static standing). As standing, pt with increased symptoms of nausea and dizziness but vitals remained WFL. Pt report need to use BSC; transferred back to w/c and in the pt's room transferred to [facility] from w/c with slide board with +2 mod A. Mod A for lateral leans to remove clothing and brief and left with nurse tech as pt asking for extra time.   Therapy Documentation Precautions:  Precautions Precautions: Cervical;Fall Precaution Comments:   Required Braces or Orthoses: Other Brace/Splint Other Brace/Splint: Cervical hard collar with thoracic brace component Restrictions Weight Bearing Restrictions: No  Pain:  c/o nausea and discomfort from brace - repositioned as able.  See FIM for current functional status  Therapy/Group: Individual Therapy  [name] [facility] 04/06/2013, 3:39 PM

## 2013-04-07 ENCOUNTER — Inpatient Hospital Stay (HOSPITAL_COMMUNITY): Payer: Medicare Other | Admitting: Occupational Therapy

## 2013-04-07 ENCOUNTER — Inpatient Hospital Stay (HOSPITAL_COMMUNITY): Payer: Medicare Other | Admitting: *Deleted

## 2013-04-07 MED ORDER — MECLIZINE HCL 25 MG PO TABS
25.0000 mg | ORAL_TABLET | Freq: Three times a day (TID) | ORAL | Status: DC | PRN
  Administered 2013-04-07: 25 mg via ORAL
  Filled 2013-04-07 (×2): qty 1

## 2013-04-07 NOTE — Progress Notes (Signed)
Patient unable to void.  Bladder scan is 680.  In and out cath performed with results of 720 ml.    Patient tolerated well.

## 2013-04-07 NOTE — Progress Notes (Signed)
Subjective/Complaints: Had a BM this am. Still complains of dizziness when turning her head in bed. Denies symptoms otherwise when up.arm pain is tolerable A 12 point review of systems has been performed and if not noted above is otherwise negative.   Objective: Vital Signs: Blood pressure 116/69, pulse 70, temperature 98.1 F (36.7 C), temperature source Oral, resp. rate 18, height 5' 5.5" (1.664 m), weight 73.6 kg (162 lb 4.1 oz), SpO2 99.00%. Dg Abd 1 View  04/06/2013   *RADIOLOGY REPORT*  Clinical Data: Nausea.  Constipation.  ABDOMEN - 1 VIEW  Comparison: 03/18/2013  Findings: The no evidence of dilated bowel loops.  Moderate stool seen throughout majority the colon.  Vascular calcifications and phleboliths noted.  IMPRESSION: No acute findings.  Moderate stool burden noted.   Original Report Authenticated By: Myles Rosenthal, M.D.   No results found for this basename: WBC, HGB, HCT, PLT,  in the last 72 hours  Recent Labs  04/06/13 0535 04/07/13 0500  NA 133*  --   K 3.9  --   CL 93*  --   GLUCOSE 104*  --   BUN 28*  --   CREATININE 1.07 0.93  CALCIUM 8.6  --    CBG (last 3)  No results found for this basename: GLUCAP,  in the last 72 hours  Wt Readings from Last 3 Encounters:  03/28/13 73.6 kg (162 lb 4.1 oz)  03/09/13 76.2 kg (167 lb 15.9 oz)  03/09/13 76.2 kg (167 lb 15.9 oz)    Physical Exam:  Constitutional: She is oriented to person, place, and time. She appears well-developed and well-nourished.  HENT:  Head: Normocephalic and atraumatic.  Right Ear: External ear normal.  Left Ear: External ear normal.  Eyes: Conjunctivae and EOM are normal. Pupils are equal, round, and reactive to light. Right eye exhibits no discharge. Left eye exhibits no discharge. No scleral icterus.  Neck: Normal range of motion. No JVD present. No tracheal deviation present. No thyromegaly present.  Cardiovascular: Normal rate and regular rhythm.  Pulmonary/Chest: Effort normal and breath  sounds normal. No respiratory distress. She has no wheezes.  Abdominal: Soft. She exhibits no distension. Bowel sounds are decreased. There is no tenderness.  Musculoskeletal: She exhibits tenderness. She exhibits no edema. Left shoulder tenderness fairly non-specific. More tender with ext rotation than internal. Pain along subacromial space perhaps a little more than other areas. Bilateral hands with Dupuytren's contractures. Missing 2nd finger of left hand.  No consistent provocation of pain with manip of shoulder Lymphadenopathy: She has no cervical adenopathy.  Neurological: She is alert and oriented to person, place, and time. No nystagmus seen with tracking or headm ovement DTRs diminished. Decreased LT to above chest. MOTOR TESTING:           Muscle  Right Left  Deltoid  4- 4 Triceps 2+ 2 Biceps  4 4 Wrist ext 1-2 1 HI  Tr-1 1 HF  2+ tr KE  3- Tr-1 ADF  3-4 1 APF  3-4 1   Psychiatric: She has a normal mood and affect. Her behavior is normal. Judgment and thought content normal.   Assessment/Plan: 1. 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 Physiatrist is providing close team supervision and 24 hour management of active medical problems listed below. Physiatrist and rehab team continue to assess barriers to discharge/monitor patient progress toward functional and medical goals. FIM:  FIM - Bathing Bathing Steps Patient Completed: Front perineal area;Right upper leg;Left upper leg;Right lower leg (including foot);Left lower leg (including foot) Bathing: 3: Mod-Patient completes 5-7 26f 10 parts or 50-74%  FIM - Upper Body Dressing/Undressing Upper body dressing/undressing steps patient completed: Thread/unthread right sleeve of pullover shirt/dresss;Thread/unthread left sleeve of pullover shirt/dress Upper body dressing/undressing: 0: Activity did not  occur FIM - Lower Body Dressing/Undressing Lower body dressing/undressing: 1: Two helpers (secondary to dizzy spells)  FIM - Hotel manager Devices: Grab bar or rail for support Toileting: 1: Two helpers  FIM - Diplomatic Services operational officer Devices: Human resources officer Transfers: 1-Two helpers  FIM - Banker Devices: HOB elevated;Bed rails;Arm rests Bed/Chair Transfer: 4: Supine > Sit: Min A (steadying Pt. > 75%/lift 1 leg);1: Two helpers (squat pivot)  FIM - Locomotion: Wheelchair Distance: 100 feet Locomotion: Wheelchair: 4: Travels 150 ft or more: maneuvers on rugs and over door sillls with minimal assistance (Pt.>75%) FIM - Locomotion: Ambulation Locomotion: Ambulation: 0: Activity did not occur  Comprehension Comprehension Mode: Auditory Comprehension: 5-Understands complex 90% of the time/Cues < 10% of the time  Expression Expression Mode: Verbal Expression: 5-Expresses complex 90% of the time/cues < 10% of the time  Social Interaction Social Interaction: 7-Interacts appropriately with others - No medications needed.  Problem Solving Problem Solving: 5-Solves complex 90% of the time/cues < 10% of the time  Memory Memory: 5-Recognizes or recalls 90% of the time/requires cueing < 10% of the time Medical Problem List and Plan:  1. DVT Prophylaxis/Anticoagulation: Pharmaceutical: Lovenox  2. Pain Management: tramadol, oxy IR. Increased neurontin to 200mg  tid for neuro pain. Scheduled meds with therapies  -liked the voltaren gel, taping, heat as well  -dc'ed mobic  3. Mood: No signs of distress. Reports generalized pain but attempting to deal with this. egosupport 4. Neuropsych: This patient is capable of making decisions on her own behalf.  5. Neurogenic bowel and bladder: requiring I and O caths    -rx'ed UTI  -working on am bowel routine, off miralax  -had good bm today. Moderate  stool on kub 6. HTN: monitor with bid checks. Continue Lisinopril, tenormin  -hctz stopped due to increased BUN  -bmet better today  -follow i's and o's 7. Leukocytosis--WBC's normal 8. Nausea/dizziness--there could be a vestibular component to dizzness  -will ask therapy to perform a vestibular assessment  -add meclizine    LOS (Days) 21 A FACE TO FACE EVALUATION WAS PERFORMED  SWARTZ,ZACHARY T 04/07/2013 7:25 AM

## 2013-04-07 NOTE — Progress Notes (Signed)
Physical Therapy Session Note  Patient Details  Name: Cassandra Walker MRN: [id number] Date of Birth: [birth year]  Today's Date: 04/07/2013 Time: 0930-1026 Time Calculation (min): 56 min  Short Term Goals: Week 3:  PT Short Term Goal 1 (Week 3): = LTGs  Skilled Therapeutic Interventions/Progress Updates:    Session focused on functional transfers, w/c propulsion, and neuro re-ed for sit to stands/dynamic standing balance/and postural control.  Initial slide board transfer off of BSC to w/c with +2 assist and then focused on squat pivot transfers with max A w/c <-> mat with cues for anterior weightshift and pushing through LE's. From edge of mat  using [name] worked on technique for sit to stands and postural control in standing; mod A +2 for sit to stands and min A for balance. Reaching for horseshoes and placing overhead on basketball hoop to work on weightshifting and dynamic balance with mod A overall; pt tends to lean to the R. Pt S with w/c mobility on unit in straight line but requires cues and min A for turns especially to the R due to LUE weakness.  Therapy Documentation Precautions:  Precautions Precautions: Cervical;Fall Precaution Comments:   Required Braces or Orthoses: Other Brace/Splint Other Brace/Splint: Cervical hard collar with thoracic brace component Restrictions Weight Bearing Restrictions: No   Pain: No pain but reports nausea, dizziness, and discomfort with neck brace intermittently - rest breaks and adjusted brace as needed.  See FIM for current functional status  Therapy/Group: Individual Therapy and Co-Treatment with Recreational Therapy  [name] [facility] 04/07/2013, 1:37 PM

## 2013-04-07 NOTE — Progress Notes (Signed)
Occupational Therapy Session Notes  Patient Details  Name: Cassandra Walker MRN: [id number] Date of Birth: 22-Jun-1932  Today's Date: 04/07/2013  Short Term Goals: Week 1:  OT Short Term Goal 1 (Week 1): Patient will hold toothpaste, unscrew toothpaste lid, and put toothpaste onto toothbrush independently OT Short Term Goal 1 - Progress (Week 1): Met OT Short Term Goal 2 (Week 1): Patient will wash hands and wash face holding wash cloth with set-up assist OT Short Term Goal 2 - Progress (Week 1): Met OT Short Term Goal 3 (Week 1): Patient will maintain dynamic sitting balance, for at least 10 minutes, edge of bed with minimal assistance in order to work on ADL retraining OT Short Term Goal 3 - Progress (Week 1): Met OT Short Term Goal 4 (Week 1): BUE HEP will be introduced to patient  OT Short Term Goal 4 - Progress (Week 1): Met OT Short Term Goal 5 (Week 1): Patient will perform self-feeding task with minimal assistance using AE prn OT Short Term Goal 5 - Progress (Week 1): Met Week 2:  OT Short Term Goal 1 (Week 2): Patient will perform BSC transfer using slide board with total assistX1 OT Short Term Goal 1 - Progress (Week 2): Met OT Short Term Goal 2 (Week 2): Patient will consistenly be set-up assist for self-feeding tasks using built up utensils prn OT Short Term Goal 2 - Progress (Week 2): Met OT Short Term Goal 3 (Week 2): Patient will complete UB dressing with moderate assistance OT Short Term Goal 3 - Progress (Week 2): Met OT Short Term Goal 4 (Week 2): Patient will recall HEP using theraputty with min verbal cues OT Short Term Goal 4 - Progress (Week 2): Met OT Short Term Goal 5 (Week 2): Tub/shower transfer using tub transfer bench will be attempted with patient OT Short Term Goal 5 - Progress (Week 2): Met Week 3:  OT Short Term Goal 1 (Week 3): Pt will complete UB dressing with min A consistently. OT Short Term Goal 2 (Week 3): Pt will complete BSC transfer using slide board  with max A. OT Short Term Goal 3 (Week 3): Pt will maintain dynamic sitting balance during fxal activity with steady assist for 15 mins. OT Short Term Goal 4 (Week 3): Pt will be mod I with bridging and rolling side<>side in bed to decrease caregiver burden during LB dressing. OT Short Term Goal 5 (Week 3): Pt will complete 4/5 grooming tasks after set-up.  Skilled Therapeutic Interventions/Progress Updates:  Session Note #1: Time: 0835-0930 (55 mins) Pt with no report of pain.  Upon entering room, pt in chair position sitting on bed pan. Granddaughter present throughout entire session. Pt rolled to R side to assist in removing bed pan and peri care. Pt completed feeding task with AE. Next, pt supine->EOB for completion of bathing/dressing tasks. Pt completed LB bathing using AE. Pt used lat leans for donning of diaper. OT applied TED hose and socks. Pt reported she was going to have a bowel mvmt and transferred->BSC using slide board (2 person max assist). Pt used lat leans for peri care and donning of diaper and pants. At end of tx session, pt seated on [facility] with PT coming in for next tx session.  Session Note #2: Time: 1130-1200 (30 mins) Pt with no report of pain.  Upon entering room, pt seated in W/C. Pt completed UB bathing/dressing and grooming tasks from W/C level @ sink. During therapeutic BADL's, pt stated she felt  nauseas and pt instructed to take rest breaks. Pt transferred from W/C->bed using slide board (1 person, total Assist). At end of tx session, pt in "chair" position in bed with nrsg present.  Session Note #3: Time: 1300-1400 (60 mins) Pt with no report of pain.  Upon entering room, pt supine in bed on bed pan. Pt rolled to R side for removal of bed pan and peri care. OT donned diaper and pants with pt bridging to assist with this. Pt transferred bed->W/C using slide board. During transfer, pt was attempting to use squat pivot technique and this was used to transfer->W/C. OT  pushed pt->rehab gym where skilled intervention focused on fxal transfers using squat pivot technique, dynamic sitting balance/endurance, bed mobility from mat level, squat pivot scoots on mat, stretching to BLE's to relieve hip flexor tightness and increase fxal use, and bridging 3x15 seconds to decrease caregiver burden during LB dressing and peri care.  Granddaughter present throughout entire session. Pt transferred mat->W/C using squat pivot. At end of tx session, pt in W/C with granddaughter pushing her down the hall.   Therapy Documentation Precautions:  Precautions Precautions: Cervical;Fall Precaution Comments:   Required Braces or Orthoses: Other Brace/Splint Other Brace/Splint: Cervical hard collar with thoracic brace component Restrictions Weight Bearing Restrictions: No  See FIM for current functional status  Therapy/Group: Individual Therapy  Kenderick Kobler 04/07/2013, 7:15 AM

## 2013-04-07 NOTE — Progress Notes (Signed)
Patient voided 375 ml tea colored urine.  PVR scan results are 327 ml.

## 2013-04-07 NOTE — Progress Notes (Signed)
Patient had mod amt soft BM after supp.

## 2013-04-07 NOTE — Patient Care Conference (Signed)
Inpatient RehabilitationTeam Conference and Plan of Care Update Date: 04/07/2013   Time: 2:15 PM    Patient Name: Cassandra Walker      Medical Record Number: [id number]  Date of Birth: Mar 13, 1932 Sex: Female         Room/Bed: 4006/4006-01 Payor Info: Payor: Advertising copywriter MEDICARE / Plan: A[employer] MEDICARE COMPLETE / Product Type: *No Product type* /    Admitting Diagnosis: traumatic SCI  Admit Date/Time:  03/17/2013  6:07 PM Admission Comments: No comment available   Primary Diagnosis:  C7 cervical fracture Principal Problem: C7 cervical fracture  Patient Active Problem List   Diagnosis Date Noted  . UTI (urinary tract infection) due to citrobacter freundi 03/26/2013  . Leucocytosis 03/26/2013  . C7 cervical fracture 03/18/2013  . Urinary retention 03/17/2013  . Fall 03/10/2013  . Fracture of C5 vertebra, closed 03/10/2013  . C6 cervical fracture 03/10/2013  . Incomplete quadriplegia at C5-6 level 03/10/2013  . HTN (hypertension) 03/10/2013  . Hyperglycemia 03/10/2013  . Pulmonary nodule 07/24/2011    Expected Discharge Date: Expected Discharge Date:  (SNF)  Team Members Present: Physician leading conference: Dr. Faith Rogue Social Worker Present: Amada Jupiter, LCSW Nurse Present: Other (comment) Keturah Barre, RN) PT Present: [name], PT OT Present: Edwin Cap, Loistine Chance, OT Other (Discipline and Name): Tora Duck, PPS Coordinator;  Ottie Glazier, RN     Current Status/Progress Goal Weekly Team Focus  Medical   nausea, dizziness in bed? constipated. pain a little better. still with neurogenic bowel  see prior, regulate bowel and bladder  see prior. assess for vestibular deficits   Bowel/Bladder   0600 bowel program. LBM 04/05/13 after bowel program. Requires I&O cath q 8hrs  Managed bowel and bladder   Continue to monitor bladder function q shift   Swallow/Nutrition/ Hydration             ADL's   grooming (S/U), bathing (min A), UB dressing (min A), LB  dressing (2 helpers), toileting (2 helpers, max A), transfers (1 person, max to total assist)  dynamic sitting balance (supervision), eating (S/U), grooming (S/U), bathing (min A), UB dressing (min A), toilet transfers (max A), simple meal prep (min A)  dynamic sitting balance/endurance, fxal transfers, BADL's, FM skills/coordination   Mobility   mod A for bed mobility; mod to max A for slideboard transfers, S w/c propulsion  downgraded to mod A overall w/c level; S for propulsion  transfers, sit to stands, strengthening, endurance,    Communication             Safety/Cognition/ Behavioral Observations            Pain   Scheduled Oxy IR 10mg , Ultram 50mg , and Robaxin. Pt refuses at times due to side effect of medication  <3  Continue to monitor for adverse side effects   Skin   Cervical anterior incision healing appropriately  No additional skin breakdown  Assess skin q shift      *See Care Plan and progress notes for long and short-term goals.  Barriers to Discharge: pain mgt, constipation and nausea    Possible Resolutions to Barriers:  care giver ed.     Discharge Planning/Teaching Needs:  Plan changed to SNF      Team Discussion:  Some nausea continues and complains of dizziness in bed.  Incontinent episodes - likely will need foley at d/c.  Now starting to do some standing (1 min) and some squat - pivot transfers.  SW reports d/c plan  has changed to SNF.  Revisions to Treatment Plan:  Change in d/c plan to SNF   Continued Need for Acute Rehabilitation Level of Care: The patient requires daily medical management by a physician with specialized training in physical medicine and rehabilitation for the following conditions: Daily direction of a multidisciplinary physical rehabilitation program to ensure safe treatment while eliciting the highest outcome that is of practical value to the patient.: Yes Daily medical management of patient stability for increased activity during  participation in an intensive rehabilitation regime.: Yes Daily analysis of laboratory values and/or radiology reports with any subsequent need for medication adjustment of medical intervention for : Neurological problems;Other;Post surgical problems  Myrlene Riera 04/07/2013, 3:37 PM

## 2013-04-07 NOTE — Progress Notes (Signed)
Notes reviewed and accurately reflect treatment sessions.   

## 2013-04-07 NOTE — Progress Notes (Signed)
Recreational Therapy Session Note  Patient Details  Name: Cassandra Walker MRN: [id number] Date of Birth: [birth year] Today's Date: 04/07/2013 Time:  [phone] Pain: no c/o- c/o discomfort with brace, relieved with repositioning Skilled Therapeutic Interventions/Progress Updates: Session focused on sit-stands from mat using eva walker Total assist +2, pt = 50%, min assist of 1 once standing statically, mod assist of 1 during reaching activity.  Therapy/Group: Co-Treatment Duaa Stelzner 04/07/2013, 4:09 PM

## 2013-04-08 ENCOUNTER — Inpatient Hospital Stay (HOSPITAL_COMMUNITY): Payer: Medicare Other | Admitting: Occupational Therapy

## 2013-04-08 ENCOUNTER — Inpatient Hospital Stay (HOSPITAL_COMMUNITY): Payer: Medicare Other | Admitting: *Deleted

## 2013-04-08 ENCOUNTER — Inpatient Hospital Stay (HOSPITAL_COMMUNITY): Payer: Medicare Other | Admitting: Speech Pathology

## 2013-04-08 ENCOUNTER — Inpatient Hospital Stay (HOSPITAL_COMMUNITY): Payer: Medicare Other

## 2013-04-08 DIAGNOSIS — W19XXXA Unspecified fall, initial encounter: Secondary | ICD-10-CM

## 2013-04-08 DIAGNOSIS — K592 Neurogenic bowel, not elsewhere classified: Secondary | ICD-10-CM

## 2013-04-08 DIAGNOSIS — N319 Neuromuscular dysfunction of bladder, unspecified: Secondary | ICD-10-CM

## 2013-04-08 DIAGNOSIS — IMO0002 Reserved for concepts with insufficient information to code with codable children: Secondary | ICD-10-CM

## 2013-04-08 MED ORDER — ALPRAZOLAM 1 MG PO TABS: 0.5000 mg | ORAL_TABLET | Freq: Every evening | ORAL | Status: DC | PRN

## 2013-04-08 MED ORDER — METHOCARBAMOL 500 MG PO TABS: 500.0000 mg | ORAL_TABLET | Freq: Four times a day (QID) | ORAL | Status: DC

## 2013-04-08 MED ORDER — OXYCODONE HCL 10 MG PO TABS: 10.0000 mg | ORAL_TABLET | ORAL | Status: DC | PRN

## 2013-04-08 MED ORDER — LISINOPRIL 40 MG PO TABS: 40.0000 mg | ORAL_TABLET | Freq: Every day | ORAL | Status: DC

## 2013-04-08 MED ORDER — SENNOSIDES-DOCUSATE SODIUM 8.6-50 MG PO TABS: 2.0000 | ORAL_TABLET | Freq: Every day | ORAL | Status: DC

## 2013-04-08 MED ORDER — BISACODYL 10 MG RE SUPP: 10.0000 mg | Freq: Every day | RECTAL | Status: DC

## 2013-04-08 MED ORDER — GABAPENTIN 100 MG PO CAPS: 200.0000 mg | ORAL_CAPSULE | Freq: Three times a day (TID) | ORAL | Status: DC

## 2013-04-08 MED ORDER — DICLOFENAC SODIUM 1 % TD GEL: 2.0000 g | Freq: Four times a day (QID) | TRANSDERMAL | Status: DC

## 2013-04-08 MED ORDER — IPRATROPIUM-ALBUTEROL 20-100 MCG/ACT IN AERS: 2.0000 | INHALATION_SPRAY | Freq: Four times a day (QID) | RESPIRATORY_TRACT | Status: DC | PRN

## 2013-04-08 MED ORDER — SENNOSIDES-DOCUSATE SODIUM 8.6-50 MG PO TABS
2.0000 | ORAL_TABLET | Freq: Every day | ORAL | Status: DC
  Administered 2013-04-08: 2 via ORAL
  Filled 2013-04-08: qty 2

## 2013-04-08 MED ORDER — LIDOCAINE HCL 2 % EX GEL: CUTANEOUS | Status: DC | PRN

## 2013-04-08 MED ORDER — SACCHAROMYCES BOULARDII 250 MG PO CAPS: 250.0000 mg | ORAL_CAPSULE | Freq: Two times a day (BID) | ORAL | Status: DC

## 2013-04-08 MED ORDER — GUAIFENESIN ER 600 MG PO TB12: 600.0000 mg | ORAL_TABLET | Freq: Two times a day (BID) | ORAL | Status: DC

## 2013-04-08 MED ORDER — ACETAMINOPHEN 325 MG PO TABS: 325.0000 mg | ORAL_TABLET | ORAL | Status: DC | PRN

## 2013-04-08 MED ORDER — MECLIZINE HCL 25 MG PO TABS: 25.0000 mg | ORAL_TABLET | Freq: Three times a day (TID) | ORAL | Status: DC | PRN

## 2013-04-08 MED ORDER — TRAMADOL HCL 50 MG PO TABS: 50.0000 mg | ORAL_TABLET | Freq: Four times a day (QID) | ORAL | Status: DC

## 2013-04-08 NOTE — Progress Notes (Signed)
Subjective/Complaints: Feeling well this am. Able to sleep. Large bm last night. Occasionally has a sense of when empyting A 12 point review of systems has been performed and if not noted above is otherwise negative.   Objective: Vital Signs: Blood pressure 114/60, pulse 68, temperature 98.1 F (36.7 C), temperature source Oral, resp. rate 18, height 5' 5.5" (1.664 m), weight 73.6 kg (162 lb 4.1 oz), SpO2 96.00%. Dg Abd 1 View  04/06/2013   *RADIOLOGY REPORT*  Clinical Data: Nausea.  Constipation.  ABDOMEN - 1 VIEW  Comparison: 03/18/2013  Findings: The no evidence of dilated bowel loops.  Moderate stool seen throughout majority the colon.  Vascular calcifications and phleboliths noted.  IMPRESSION: No acute findings.  Moderate stool burden noted.   Original Report Authenticated By: Myles Rosenthal, M.D.   No results found for this basename: WBC, HGB, HCT, PLT,  in the last 72 hours  Recent Labs  04/06/13 0535 04/07/13 0500  NA 133*  --   K 3.9  --   CL 93*  --   GLUCOSE 104*  --   BUN 28*  --   CREATININE 1.07 0.93  CALCIUM 8.6  --    CBG (last 3)  No results found for this basename: GLUCAP,  in the last 72 hours  Wt Readings from Last 3 Encounters:  03/28/13 73.6 kg (162 lb 4.1 oz)  03/09/13 76.2 kg (167 lb 15.9 oz)  03/09/13 76.2 kg (167 lb 15.9 oz)    Physical Exam:  Constitutional: She is oriented to person, place, and time. She appears well-developed and well-nourished.  HENT:  Head: Normocephalic and atraumatic.  Right Ear: External ear normal.  Left Ear: External ear normal.  Eyes: Conjunctivae and EOM are normal. Pupils are equal, round, and reactive to light. Right eye exhibits no discharge. Left eye exhibits no discharge. No scleral icterus.  Neck: Normal range of motion. No JVD present. No tracheal deviation present. No thyromegaly present.  Cardiovascular: Normal rate and regular rhythm.  Pulmonary/Chest: Effort normal and breath sounds normal. No respiratory  distress. She has no wheezes.  Abdominal: Soft. She exhibits no distension. Bowel sounds are decreased. There is no tenderness.  Musculoskeletal: She exhibits tenderness. She exhibits no edema. Left shoulder tenderness fairly non-specific. More tender with ext rotation than internal. Pain along subacromial space perhaps a little more than other areas. Bilateral hands with Dupuytren's contractures. Missing 2nd finger of left hand.  No consistent provocation of pain with manip of shoulder Lymphadenopathy: She has no cervical adenopathy.  Neurological: She is alert and oriented to person, place, and time. No nystagmus seen with tracking or headm ovement DTRs diminished. Decreased LT to above chest. MOTOR TESTING:           Muscle  Right Left  Deltoid  4- 4 Triceps 2+ 2 Biceps  4 4 Wrist ext 1-2 1 HI  Tr-1 1 HF  2+ tr KE  3- Tr-1 ADF  3-4 1 APF  3-4 1   Psychiatric: She has a normal mood and affect. Her behavior is normal. Judgment and thought content normal.   Assessment/Plan: 1. 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 Physiatrist is providing close team supervision and 24 hour management of active medical problems listed below. Physiatrist and rehab team continue to assess barriers to discharge/monitor patient progress toward functional and medical goals. FIM: FIM - Bathing Bathing Steps  Patient Completed: Chest;Right Arm;Left Arm;Abdomen;Front perineal area;Right upper leg;Left upper leg;Right lower leg (including foot);Left lower leg (including foot) Bathing: 4: Min-Patient completes 8-9 71f 10 parts or 75+ percent  FIM - Upper Body Dressing/Undressing Upper body dressing/undressing steps patient completed: Thread/unthread right sleeve of pullover shirt/dresss;Thread/unthread left sleeve of pullover shirt/dress;Put head through opening of pull over  shirt/dress Upper body dressing/undressing: 4: Min-Patient completed 75 plus % of tasks FIM - Lower Body Dressing/Undressing Lower body dressing/undressing: 1: Two helpers  FIM - Hotel manager Devices: Grab bar or rail for support Toileting: 1: Two helpers  FIM - Diplomatic Services operational officer Devices: Human resources officer Transfers: 1-Two helpers (max A +2 helpers)  FIM - Banker Devices: Sliding board;Bed rails Bed/Chair Transfer: 4: Supine > Sit: Min A (steadying Pt. > 75%/lift 1 leg);1: Two helpers (2 helpers for transfer d/t pt attempting squat pivot)  FIM - Locomotion: Wheelchair Distance: 100 feet Locomotion: Wheelchair: 2: Travels 50 - 149 ft with minimal assistance (Pt.>75%) FIM - Locomotion: Ambulation Locomotion: Ambulation: 0: Activity did not occur  Comprehension Comprehension Mode: Auditory Comprehension: 5-Understands complex 90% of the time/Cues < 10% of the time  Expression Expression Mode: Verbal Expression: 7-Expresses complex ideas: With no assist  Social Interaction Social Interaction: 7-Interacts appropriately with others - No medications needed.  Problem Solving Problem Solving: 5-Solves complex 90% of the time/cues < 10% of the time  Memory Memory: 5-Recognizes or recalls 90% of the time/requires cueing < 10% of the time Medical Problem List and Plan:  1. DVT Prophylaxis/Anticoagulation: Pharmaceutical: Lovenox  2. Pain Management: tramadol, oxy IR. Increased neurontin to 200mg  tid for neuro pain. Scheduled meds with therapies  -liked the voltaren gel, taping, heat as well  -dc'ed mobic  3. Mood: No signs of distress. Reports generalized pain but attempting to deal with this. egosupport 4. Neuropsych: This patient is capable of making decisions on her own behalf.  5. Neurogenic bowel and bladder: requiring I and O caths    -rx'ed UTI  -had large bm last night..  Moderate stool on kub  -continue to refine bowel program 6. HTN: monitor with bid checks. Continue Lisinopril, tenormin  -hctz stopped due to increased BUN  -bmet improving  -follow i's and o's 7. Leukocytosis--WBC's normal 8. Nausea/dizziness--?anxiety or pain related---only happens when she turns in bed  -doesn't appear to be vestibular  -continue prn meclizine    LOS (Days) 22 A FACE TO FACE EVALUATION WAS PERFORMED  Macrae Wiegman T 04/08/2013 7:25 AM

## 2013-04-08 NOTE — Progress Notes (Signed)
Recreational Therapy Session Note  Patient Details  Name: Cassandra Walker MRN: [id number] Date of Birth: 07/22/1932 Today's Date: 04/08/2013 Time:  09-1129  Pain: no c/o Skilled Therapeutic Interventions/Progress Updates: Pt completed tabletop leisure task using  BUE's with supervision-set up assit for fine & gross motor tasks seated w/c level.  Therapy/Group: Individual Therapy   Christine Schiefelbein 04/08/2013, 4:51 PM

## 2013-04-08 NOTE — Progress Notes (Signed)
Physical Therapy Session Note  Patient Details  Name: Cassandra Walker MRN: [id number] Date of Birth: 02-Apr-1932  Today's Date: 04/08/2013 Time: 1005-1100 Time Calculation (min): 55 min  Short Term Goals: Week 3:  PT Short Term Goal 1 (Week 3): = LTGs  Skilled Therapeutic Interventions/Progress Updates:    Session focused on functional transfer for OOB (squat pivot with cues for hand placement, technique and weightshift), w/c propulsion for endurance and functional use of UEs, and neuro re-ed for gait training using Huntley Dec Plus. Pt able to gait x 5' and then x 26' with total A +2 (1 person to steer Huntley Dec, other person to assist with advancing LE's) - pt noted with ataxic gait, decreased step length (L > R), steppage gait noted with RLE, and circumduction for LLE; decreased dorsiflexion noted bilaterally.    Therapy Documentation Precautions:  Precautions Precautions: Cervical;Fall Precaution Comments:   Required Braces or Orthoses: Other Brace/Splint Other Brace/Splint: Cervical hard collar with thoracic brace component Restrictions Weight Bearing Restrictions: No Pain: Pain Assessment Pain Assessment: No/denies pain Pain Score: 0-No pain Locomotion : Ambulation Ambulation/Gait Assistance: 1: +2 Total assist   See FIM for current functional status  Therapy/Group: Individual Therapy  [name] [facility] 04/08/2013, 12:27 PM

## 2013-04-08 NOTE — Progress Notes (Signed)
Occupational Therapy Note  Patient Details  Name: Cassandra Walker MRN: [id number] Date of Birth: 1932-05-19 Today's Date: 04/08/2013 .   Time In:  0930  Time Out 10:05.  Individual session no c/o pain.  Patient supine in bed resting comfortably. Received order from MD and recommendation from team for vestibular eval. Patient has been complaining of feeling dizzy when "rolling right - I feel like I am going to pass out."  Unable to do traditional supine head rolls  or hallpike or modified hallpike due to cervical brace.  Completed modified body rolls in supine and patient significantly symptomatic (nystagmus intensity 10/10 and symptoms lasting 20-30 seconds) ONLY when rolling from right sidelying to her back. Patient reports she does not experience this at any other time but I would suspect this is because the cervical collar limits her with head rotation.  Unable to complete traditional canalith repositioning but will attempt modified repositioning tomorrow with the assistance of a second therapist.  Patient does benefit from compensatory strategies including closing her eyes and using grounding in supine.  Patient able to recall and demonstrate strategies at end of session.  Nursing educated as well and sign placed above bed.  Will follow.  Thanks.   Norton Pastel 04/08/2013, 10:24 AM

## 2013-04-08 NOTE — Progress Notes (Signed)
Occupational Therapy Note  Patient Details  Name: Cassandra Walker MRN: [id number] Date of Birth: 1931/12/29 Today's Date: 04/08/2013  Time: 1130-1215 Pt denies pain Group Therapy  Pt participated in self feeding group with focus on increased BUE use for opening containers and self feeding.  Pt required assistance with opening containers but completed meal without assistance.  Pt had ordered a hot dog and did not required use of utensils.  Pt stated she had difficulty holding a utensil even with foam buildup.     Lavone Neri [facility] 04/08/2013, 3:34 PM

## 2013-04-08 NOTE — Progress Notes (Signed)
Social Work Patient ID: Cassandra Walker, female   DOB: November 08, 1931, 79 y.o.   MRN: [id number]  Received SNF bed offer today from [facility] - pt and grandtr notified and have accepted bed with plan to transfer tomorrow.  MD and tx team aware.  Pt and family pleased with this as [name] choice of facilities.  Magenta Schmiesing, LCSW

## 2013-04-08 NOTE — Discharge Summary (Signed)
Physician Discharge Summary  Patient ID: NORVELL URESTE MRN: [id number] DOB/AGE: 77/30/1933 77 y.o.  Admit date: 03/17/2013 Discharge date: 04/09/2013  Discharge Diagnoses:  Principal Problem:   C7 cervical fracture Active Problems:   Incomplete quadriplegia at C5-6 level   HTN (hypertension)   Urinary retention   UTI (urinary tract infection) due to citrobacter freundi   Leucocytosis   Vestibular dysfunction   Discharged Condition:  Improved.   Significant Diagnostic Studies: Dg Abd 1 View  04/06/2013   *RADIOLOGY REPORT*  Clinical Data: Nausea.  Constipation.  ABDOMEN - 1 VIEW  Comparison: 03/18/2013  Findings: The no evidence of dilated bowel loops.  Moderate stool seen throughout majority the colon.  Vascular calcifications and phleboliths noted.  IMPRESSION: No acute findings.  Moderate stool burden noted.   Original Report Authenticated By: Myles Rosenthal, M.D.   Dg Cerv Spine 1v Clearing  03/16/2013   *RADIOLOGY REPORT*  Clinical Data: C6-7 fracture.  LIMITED CERVICAL SPINE FOR TRAUMA CLEARING - 1 VIEW  Comparison: 03/09/2013  Findings: Changes of posterior fusion from C4-C7.  It is difficult to visualize the lower cervical spine due to overlying shoulders despite the swimmer's view.  There appears to be normal alignment.  IMPRESSION: Despite this swimmer's view, the lower cervical spine remains difficult to visualize.  There does appear to be normal alignment. Otherwise, fine bony detail is obscured due to overlying shoulders.   Original Report Authenticated By: Charlett Nose, M.D.    Labs:  Basic Metabolic Panel:  Recent Labs Lab 04/06/13 0535 04/07/13 0500  NA 133*  --   K 3.9  --   CL 93*  --   CO2 30  --   GLUCOSE 104*  --   BUN 28*  --   CREATININE 1.07 0.93  CALCIUM 8.6  --     CBC: No results found for this basename: WBC, NEUTROABS, HGB, HCT, MCV, PLT,  in the last 168 hours  CBG: No results found for this basename: GLUCAP,  in the last 168 hours  Brief HPI:    Cassandra Walker is a 79 y.o. female who sustained SCI and was admitted on 03/07/13 with significant impairement in LE>UE as well as complaints of  severe neck pain with shocking sensations in back and numbness.MRI C-spine revealed anterior wedge compression fracture of C7 vertebral body, bilateral C6-C7 facet fractures with jumped C6 facet, relative retrolisthesis of compressed C7 with cord compression and edema. She was taken to OR emergently for ORIF C6-C7 with mass fixation /fusion C4-C7 by Dr. Wynetta Emery and has been fitted for thoracic brace with cervical prior to initiation of therapies. She has had improvement in strength but limited by LE weakness with pain and requires in and out caths for neurogenic bladder. Therapy team recommended CIR and patient admitted for progressive theraies.    Hospital Course: JOANETTE SILVERIA was admitted to rehab 03/17/2013 for inpatient therapies to consist of PT, ST and OT at least three hours five days a week. Past admission physiatrist, therapy team and rehab RN have worked together to provide customized collaborative inpatient rehab. She was started on bowel and bladder program for assistance with neurogenic B/B. She was started on flomax as well as urecholine but urecholine was discontinued due to diarrhea. Urine culture done revealed Citrobacter UTI and she was treated with cipro. Reactive  leucocytosis due to UTI resolved. She continues to require in and out cath on qid basis. In the past few days, she has had small volume of  urinary output during bowel program. She has had problems with nausea recently and KUB showed moderate stool burden. She was started on senna s at bedtime in addition to her am suppository with abdominal massage help with bowel function.   Blood pressures were monitored on bid basis and have been well controlled. She developed acute renal failure with BUN/Cr rising to 41 and 1.17. HCTZ and mobic were discontinued and patient was advised to push fluids.  These measures have been effective in resolving renal failure.  Neurontin was added and titrated to help with neuropathy. Tramadol qid as well as premedication with oxycodone prior to therapy sessions have been effective in pain management. She has had complaints of dizziness in the last few days with evaluation revealing vestibular dysfunction.  She was advised on compensatory strategies including closing her eyes and using grounding in spine. Treatment limited due to her brace.   Rehab course: During patient's stay in rehab weekly team conferences were held to monitor patient's progress, set goals and discuss barriers to discharge.    Occupational therapy has focused on neuromuscular reeducation of UE as well as ADL tasks and endurance. She is showing increase in use of BUE for opening containers and self feeding with AE. She requires SB with +2 assist (one to stabilize board) to transfer to [facility]. Marland Kitchen She is able to complete ADL tasks with min assist. She is able to perform LB bathing with AE in bed and able to perform UB bathing, dressing and grooming at [facility] level.    Physical therapy has focused on bed mobility, transfers, w/c propulsion, standing balance and postural control. She requires max assist for wheelchair to mat transfers with cues for anterior weightshift and pushing through LE's. She is able to perform sit to stands and postural control with mod assist +2 from mat with EVA walker. Once up she requires min A for balance. Is able to stand for 5 -10 minutes with rest breaks. She is able to propel wheelchair ~150 feet with supervision but requires min assist with turns to the right due to LUE weakness.   Disposition: Skilled Holiday representative.   Diet: Regular  Special Instructions: 1. Wear neck brace with thoracic extension at all times.  2. Bowel program in am daily. Get patient to [facility] and massage lower abdomen to help with evacuation.  3. Cath patient qid. Have patient attempt to void prior to  cath.  4. Boost every 30 minutes when in chair.          Future Appointments Provider Department Dept Phone   05/06/2013 11:00 AM Ranelle Oyster, MD [facility] [phone]       Medication List    STOP taking these medications       ALEVE 220 MG Caps  Generic drug:  Naproxen Sodium     lisinopril-hydrochlorothiazide 20-12.5 MG per tablet  Commonly known as:  PRINZIDE,ZESTORETIC     meloxicam 15 MG tablet  Commonly known as:  MOBIC      TAKE these medications       acetaminophen 325 MG tablet  Commonly known as:  TYLENOL  Take 1-2 tablets (325-650 mg total) by mouth every 4 (four) hours as needed.     ALPRAZolam 1 MG tablet  Commonly known as:  XANAX  Take 0.5 tablets (0.5 mg total) by mouth at bedtime as needed for sleep.     atenolol 25 MG tablet  Commonly known as:  TENORMIN  Take 1  tablet (25 mg total) by mouth 2 (two) times daily.     bisacodyl 10 MG suppository  Commonly known as:  DULCOLAX  Place 1 suppository (10 mg total) rectally daily at 6 (six) AM.     diclofenac sodium 1 % Gel  Commonly known as:  VOLTAREN  Apply 2 g topically 4 (four) times daily. To right shoulder     gabapentin 100 MG capsule  Commonly known as:  NEURONTIN  Take 2 capsules (200 mg total) by mouth 3 (three) times daily.     guaiFENesin 600 MG 12 hr tablet  Commonly known as:  MUCINEX  Take 1 tablet (600 mg total) by mouth 2 (two) times daily.     Ipratropium-Albuterol 20-100 MCG/ACT Aers respimat  Commonly known as:  COMBIVENT  Inhale 2 puffs into the lungs every 6 (six) hours as needed for wheezing or shortness of breath.     levothyroxine 75 MCG tablet  Commonly known as:  SYNTHROID, LEVOTHROID  Take 75 mcg by mouth daily.     lidocaine 2 % jelly  Commonly known as:  XYLOCAINE  Apply topically as needed (Use with in and out catheter).     LIPITOR 10 MG tablet  Generic drug:  atorvastatin  Take 5 mg by mouth daily.      lisinopril 20 MG tablet  Commonly known as:  PRINIVIL,ZESTRIL  Take 1 tablet (20 mg total) by mouth daily.  Start taking on:  04/10/2013     meclizine 25 MG tablet  Commonly known as:  ANTIVERT  Take 1 tablet (25 mg total) by mouth 3 (three) times daily as needed for dizziness or nausea.     methocarbamol 500 MG tablet  Commonly known as:  ROBAXIN  Take 1 tablet (500 mg total) by mouth 4 (four) times daily.     Oxycodone HCl 10 MG Tabs  Take 1 tablet (10 mg total) by mouth every 3 (three) hours as needed.     saccharomyces boulardii 250 MG capsule  Commonly known as:  FLORASTOR  Take 1 capsule (250 mg total) by mouth 2 (two) times daily.     senna-docusate 8.6-50 MG per tablet  Commonly known as:  Senokot-S  Take 2 tablets by mouth at bedtime.     traMADol 50 MG tablet  Commonly known as:  ULTRAM  Take 1 tablet (50 mg total) by mouth every 6 (six) hours.       Follow-up Information   Follow up with Ranelle Oyster, MD On 05/06/2013. (Be there at 10:30 for 11 am appointment)    Contact information:   [address], [address] [place] [address] [phone]       Follow up with Mariam Dollar, MD. Call today. (for follow up appointment in 2 weeks. )    Contact information:   [address] [place] [address] [phone]       Signed: Jacquelynn Cree 04/09/2013, 9:32 AM

## 2013-04-08 NOTE — Progress Notes (Signed)
Notes reviewed and accurately reflect treatment sessions.   

## 2013-04-08 NOTE — Progress Notes (Signed)
Occupational Therapy Session Notes  Patient Details  Name: Cassandra Walker MRN: [id number] Date of Birth: [birth year]  Today's Date: 04/08/2013  Short Term Goals: Week 1:  OT Short Term Goal 1 (Week 1): Patient will hold toothpaste, unscrew toothpaste lid, and put toothpaste onto toothbrush independently OT Short Term Goal 1 - Progress (Week 1): Met OT Short Term Goal 2 (Week 1): Patient will wash hands and wash face holding wash cloth with set-up assist OT Short Term Goal 2 - Progress (Week 1): Met OT Short Term Goal 3 (Week 1): Patient will maintain dynamic sitting balance, for at least 10 minutes, edge of bed with minimal assistance in order to work on ADL retraining OT Short Term Goal 3 - Progress (Week 1): Met OT Short Term Goal 4 (Week 1): BUE HEP will be introduced to patient  OT Short Term Goal 4 - Progress (Week 1): Met OT Short Term Goal 5 (Week 1): Patient will perform self-feeding task with minimal assistance using AE prn OT Short Term Goal 5 - Progress (Week 1): Met Week 2:  OT Short Term Goal 1 (Week 2): Patient will perform BSC transfer using slide board with total assistX1 OT Short Term Goal 1 - Progress (Week 2): Met OT Short Term Goal 2 (Week 2): Patient will consistenly be set-up assist for self-feeding tasks using built up utensils prn OT Short Term Goal 2 - Progress (Week 2): Met OT Short Term Goal 3 (Week 2): Patient will complete UB dressing with moderate assistance OT Short Term Goal 3 - Progress (Week 2): Met OT Short Term Goal 4 (Week 2): Patient will recall HEP using theraputty with min verbal cues OT Short Term Goal 4 - Progress (Week 2): Met OT Short Term Goal 5 (Week 2): Tub/shower transfer using tub transfer bench will be attempted with patient OT Short Term Goal 5 - Progress (Week 2): Met Week 3:  OT Short Term Goal 1 (Week 3): Pt will complete UB dressing with min A consistently. OT Short Term Goal 2 (Week 3): Pt will complete BSC transfer using slide board  with max A. OT Short Term Goal 3 (Week 3): Pt will maintain dynamic sitting balance during fxal activity with steady assist for 15 mins. OT Short Term Goal 4 (Week 3): Pt will be mod I with bridging and rolling side<>side in bed to decrease caregiver burden during LB dressing. OT Short Term Goal 5 (Week 3): Pt will complete 4/5 grooming tasks after set-up.  Skilled Therapeutic Interventions/Progress Updates:  Session Note #1: Time: 0730-0830 (60 mins) Pt with no report of pain. Pt had minimal c/o dizziness upon waking up.  Upon entering room, pt supine in bed. Pt reported she needed to use the BR and transferred->BSC using slide board (2 person, 1 for stabilization of slide board). On BSC pt became very pale and sweaty and pt's BP was taken (80/54). Pt used lat leans to assist with peri care and donning of diaper. BP taken again and was 82/55. Nrsg notified. Pt transferred->bed using squat pivot technique. Pt placed supine in bed and pt encouraged to eat. Once supine BP was 117/74. Pt completed feeding task, fed self meds, and demonstrated ability to open containers today. Currently, it is difficult for pt to switch between squat pivot and slide board transfers and therefore one should be used to promote continuity between disciplines and for the pt's benefit. At end of tx session, pt in "chair" position in bed with soft call bell under R  hand.  Session Note #2: Time: 1300-1400 (60 mins) Pt with no report of pain.  Upon entering room, pt seated in W/C. Pt propelled W/C part of the way->rehab gym (~50 ft). Pt engaged in simple meal prep activity with skilled intervention focusing on standing endurance (using sara plus lift; standing session #1=10 mins, standing session #2=5 mins), fxal use of BUE's, FM skills/coordination, dynamic sitting balance/endurance, postural control, UE strength/endurance, and overall activity tolerance/endurnace. Pt pushed self->room (~150 ft). At end of tx session, pt seated  in W/C with soft call bell under L hand.  Therapy Documentation Precautions:  Precautions Precautions: Cervical;Fall Precaution Comments:   Required Braces or Orthoses: Other Brace/Splint Other Brace/Splint: Cervical hard collar with thoracic brace component Restrictions Weight Bearing Restrictions: No  See FIM for current functional status  Therapy/Group: Individual Therapy  Cassandra Walker 04/08/2013, 7:24 AM

## 2013-04-09 ENCOUNTER — Inpatient Hospital Stay (HOSPITAL_COMMUNITY): Payer: Medicare Other

## 2013-04-09 ENCOUNTER — Inpatient Hospital Stay (HOSPITAL_COMMUNITY): Payer: Medicare Other | Admitting: Occupational Therapy

## 2013-04-09 ENCOUNTER — Encounter (HOSPITAL_COMMUNITY): Payer: Medicare Other | Admitting: Speech Pathology

## 2013-04-09 DIAGNOSIS — H832X9 Labyrinthine dysfunction, unspecified ear: Secondary | ICD-10-CM | POA: Clinically undetermined

## 2013-04-09 MED ORDER — ATENOLOL 25 MG PO TABS
25.0000 mg | ORAL_TABLET | Freq: Two times a day (BID) | ORAL | Status: DC
  Filled 2013-04-09 (×2): qty 1

## 2013-04-09 MED ORDER — LISINOPRIL 20 MG PO TABS: 20.0000 mg | ORAL_TABLET | Freq: Every day | ORAL | Status: DC

## 2013-04-09 MED ORDER — ATENOLOL 25 MG PO TABS: 25.0000 mg | ORAL_TABLET | Freq: Two times a day (BID) | ORAL | Status: DC

## 2013-04-09 MED ORDER — LISINOPRIL 20 MG PO TABS
20.0000 mg | ORAL_TABLET | Freq: Every day | ORAL | Status: DC
  Filled 2013-04-09: qty 1

## 2013-04-09 NOTE — Progress Notes (Signed)
Subjective/Complaints: Good night. Pain controlled A 12 point review of systems has been performed and if not noted above is otherwise negative.   Objective: Vital Signs: Blood pressure 102/54, pulse 73, temperature 97.3 F (36.3 C), temperature source Oral, resp. rate 17, height 5' 5.5" (1.664 m), weight 73.6 kg (162 lb 4.1 oz), SpO2 95.00%. No results found. No results found for this basename: WBC, HGB, HCT, PLT,  in the last 72 hours  Recent Labs  04/07/13 0500  CREATININE 0.93   CBG (last 3)  No results found for this basename: GLUCAP,  in the last 72 hours  Wt Readings from Last 3 Encounters:  03/28/13 73.6 kg (162 lb 4.1 oz)  03/09/13 76.2 kg (167 lb 15.9 oz)  03/09/13 76.2 kg (167 lb 15.9 oz)    Physical Exam:  Constitutional: She is oriented to person, place, and time. She appears well-developed and well-nourished.  HENT:  Head: Normocephalic and atraumatic.  Right Ear: External ear normal.  Left Ear: External ear normal.  Eyes: Conjunctivae and EOM are normal. Pupils are equal, round, and reactive to light. Right eye exhibits no discharge. Left eye exhibits no discharge. No scleral icterus.  Neck: Normal range of motion. No JVD present. No tracheal deviation present. No thyromegaly present.  Cardiovascular: Normal rate and regular rhythm.  Pulmonary/Chest: Effort normal and breath sounds normal. No respiratory distress. She has no wheezes.  Abdominal: Soft. She exhibits no distension. Bowel sounds are decreased. There is no tenderness.  Musculoskeletal: She exhibits tenderness. She exhibits no edema. Left shoulder tenderness fairly non-specific. More tender with ext rotation than internal. Pain along subacromial space perhaps a little more than other areas. Bilateral hands with Dupuytren's contractures. Missing 2nd finger of left hand.  No consistent provocation of pain with manip of shoulder.  Lymphadenopathy: She has no cervical adenopathy.  Neurological: She is  alert and oriented to person, place, and time. No nystagmus seen with tracking or headm ovement DTRs diminished. Decreased LT to above chest. MOTOR TESTING:           Muscle  Right Left  Deltoid  4- 4 Triceps 2+ 2 Biceps  4 4 Wrist ext 1-2 1 HI  Tr-1 1 HF  2+ tr KE  3- Tr-1 ADF  3-4 1 APF  3-4 1   Psychiatric: She has a normal mood and affect. Her behavior is normal. Judgment and thought content normal.   Assessment/Plan: 1. 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 Physiatrist is providing close team supervision and 24 hour management of active medical problems listed below. Physiatrist and rehab team continue to assess barriers to discharge/monitor patient progress toward functional and medical goals.  Dc to SNF today.   FIM: FIM - Bathing Bathing Steps Patient Completed: Chest;Right Arm;Left Arm;Abdomen;Front perineal area;Right upper leg;Left upper leg;Right lower leg (including foot);Left lower leg (including foot) Bathing: 0: Activity did not occur  FIM - Upper Body Dressing/Undressing Upper body dressing/undressing steps patient completed: Thread/unthread right sleeve of pullover shirt/dresss;Thread/unthread left sleeve of pullover shirt/dress;Put head through opening of pull over shirt/dress Upper body dressing/undressing: 0: Activity did not occur FIM - Lower Body Dressing/Undressing Lower body dressing/undressing: 0: Activity did not occur  FIM - Hotel manager Devices: Grab bar or rail for support Toileting: 1: Two helpers  FIM - Diplomatic Services operational officer Devices: Human resources officer Transfers: 1-Two helpers (1  for board stabilization)  FIM - Bed/Chair Transfer Bed/Chair Transfer Assistive Devices: Bed rails;Arm rests;Orthosis Bed/Chair Transfer: 4: Supine > Sit: Min A (steadying Pt. >  75%/lift 1 leg);2: Bed > Chair or W/C: Max A (lift and lower assist)  FIM - Locomotion: Wheelchair Distance: 100 feet Locomotion: Wheelchair: 2: Travels 50 - 149 ft with supervision, cueing or coaxing FIM - Locomotion: Ambulation Locomotion: Ambulation Assistive Devices: Orthosis;[place] Dec Plus Ambulation/Gait Assistance: 1: +2 Total assist Locomotion: Ambulation: 1: Two helpers  Comprehension Comprehension Mode: Auditory Comprehension: 5-Understands complex 90% of the time/Cues < 10% of the time  Expression Expression Mode: Verbal Expression: 6-Expresses complex ideas: With extra time/assistive device  Social Interaction Social Interaction: 6-Interacts appropriately with others with medication or extra time (anti-anxiety, antidepressant).  Problem Solving Problem Solving: 5-Solves complex 90% of the time/cues < 10% of the time  Memory Memory: 5-Recognizes or recalls 90% of the time/requires cueing < 10% of the time  Medical Problem List and Plan:  1. DVT Prophylaxis/Anticoagulation: Pharmaceutical: Lovenox  2. Pain Management: tramadol, oxy IR. Increased neurontin to 200mg  tid for neuro pain. Scheduled meds with therapies  -liked the voltaren gel, taping, heat as well  -dc'ed mobic  3. Mood: No signs of distress. Reports generalized pain but attempting to deal with this. egosupport 4. Neuropsych: This patient is capable of making decisions on her own behalf.  5. Neurogenic bowel and bladder: requiring I and O caths    -rx'ed UTI  -had large bm last night.. Moderate stool on kub  -continue to refine bowel program 6. HTN: monitor with bid checks. Continue Lisinopril, tenormin  -hctz stopped due to increased BUN  -bmet improving  -follow i's and o's 7. Leukocytosis--WBC's normal 8. Nausea/dizziness--?anxiety or pain related---only happens when she turns in bed  -OT dx'ed vestibular dysfunction--treatment options are limited given brace/neck  -continue prn meclizine    LOS  (Days) 23 A FACE TO FACE EVALUATION WAS PERFORMED  Jeimy Bickert T 04/09/2013 7:39 AM

## 2013-04-09 NOTE — Progress Notes (Signed)
Occupational Therapy Session Note & Discharge Summary  Patient Details  Name: Cassandra Walker MRN: [id number] Date of Birth: August 10, 1932  Today's Date: 04/09/2013 __________________________________________________________________________ Session Note #1: Time: [id number] (65 mins) Pt with no c/o pain.  Upon entering room, pt supine in bed with PA present. Pt completed UB/LB bathing/dressing @ EOB (supervision-steady assist for dynamic sitting balance). OT used the "stedy" for donning diaper & pulling up pants. Next, pt attempted to complete grooming @ sink while standing in the "stedy" but reported she felt weak and hot. BP was taken and was 102/60. Pt transferred->w/c where grooming was completed. At end of tx session, pt seated in w/c with soft call bell under L hand. __________________________________________________________________________  Patient has met 6 of 8 long term goals due to improved activity tolerance, improved balance, postural control, ability to compensate for deficits, functional use of  RIGHT upper extremity, improved awareness and improved coordination.  Patient to discharge at overall max-total assist for transfers, min assist for bathing, min assist for UB dressing and total assist for LB dressing level.  Patient's care partner requires assistance to provide the necessary physical assistance at discharge. Pt is being D/C to SNF for further rehab.    Reasons goals not met: Pt fluctuated throughout stay on CIR. Pt exhibited lack of carry-over between sessions but overall made good progress towards goals. Pt will benefit from further OT services.  Recommendation:  Patient will benefit from ongoing skilled OT services in skilled nursing facility setting to continue to advance functional skills in the area of BADL, iADL and Reduce care partner burden.  Equipment: No equipment provided at this time.  Reasons for discharge: discharge from hospital  Patient/family agrees with  progress made and goals achieved: Yes  OT Discharge Precautions/Restrictions  Precautions Precautions: Fall Required Braces or Orthoses: Cervical Brace Cervical Brace: Hard collar Other Brace/Splint: Cervical hard collar with thoracic brace component Restrictions Weight Bearing Restrictions: No  ADL:  See FIM  Vision/Perception  Vision - History Baseline Vision: No visual deficits Patient Visual Report: No change from baseline Vision - Assessment Eye Alignment: Within Functional Limits Perception Perception: Within Functional Limits Praxis Praxis: Intact   Cognition Overall Cognitive Status: Within Functional Limits for tasks assessed Arousal/Alertness: Awake/alert Orientation Level: Oriented X4 Attention: Focused Focused Attention: Appears intact Memory: Impaired Memory Impairment: Decreased recall of new information Awareness: Appears intact Problem Solving: Appears intact Safety/Judgment: Appears intact  Sensation Sensation Light Touch: Appears Intact Additional Comments: Pt with complains of cold/tingling sensation in BUE's Coordination Gross Motor Movements are Fluid and Coordinated: No Fine Motor Movements are Fluid and Coordinated: No Coordination and Movement Description: decreased coordination in UE's but has improved since eval  Motor  Motor Motor: Ataxia;Abnormal postural alignment and control;Tetraplegia  Trunk/Postural Assessment  Cervical Assessment Cervical Assessment: Exceptions to [facility] (cervical collar; NT) Thoracic Assessment Thoracic Assessment: Exceptions to [facility] (limited due to brace) Lumbar Assessment Lumbar Assessment: Exceptions to [facility] (posterior pelvic tilt) Postural Control Postural Control: Deficits on evaluation Trunk Control: decreased when outside limits of stability  Balance Static Sitting Balance Static Sitting - Level of Assistance: 5: Stand by assistance Dynamic Sitting Balance Dynamic Sitting - Level of Assistance: 5:  Stand by assistance (with UE support) Static Standing Balance Static Standing - Level of Assistance: 3: Mod assist;4: Min assist (using [name]) Dynamic Standing Balance Dynamic Standing - Level of Assistance: 3: Mod assist  Extremity/Trunk Assessment RUE Assessment RUE Assessment: Exceptions to [facility] RUE AROM (degrees) RUE Overall AROM Comments: decreased overall  RUE AROM RUE PROM (degrees) RUE Overall PROM Comments: PROM is [facility] RUE Strength Right Shoulder Flexion:  (overall strength has improved) LUE Assessment LUE Assessment: Exceptions to WFL LUE AROM (degrees) LUE Overall AROM Comments: decreased overall AROM throuhgout LUE LUE PROM (degrees) LUE Overall PROM Comments: PROM is WFL, left shoulders ROM is decreased secondary to pre-morbid condition (patient fractured shoulder ~4 years ago)  See FIM for current functional status   Cassandra Walker 04/09/2013, 12:22 PM

## 2013-04-09 NOTE — Plan of Care (Signed)
Problem: RH PAIN MANAGEMENT Goal: RH STG PAIN MANAGED AT OR BELOW PT'S PAIN GOAL Less than 3  Outcome: Completed/Met Date Met:  04/09/13 No c/o pain. Refusing pain medication majority of the time on discharge.

## 2013-04-09 NOTE — Progress Notes (Signed)
Physical Therapy Discharge Summary  Patient Details  Name: Cassandra Walker MRN: [id number] Date of Birth: [birth year]  Today's Date: 04/09/2013 Time: 0930-1030 Time Calculation (min): 60 min Individual therapy; session focused on grad day activities including basic transfers, bed mobility, w/c propulsion and gait training with [place] Dec Plus. Pt overall mod A using slide board on level surfaces with cues needed for hand placement and anterior weightshift. Gait with Huntley Dec Plus with focus on postural control, advancing and controlling movement of BLE  (increased assist needed with LLE)  and overall activity tolerance x 16' and then x 20'  Patient has met 5 of 6 long term goals due to improved activity tolerance, improved balance, improved postural control, increased strength, decreased pain, ability to compensate for deficits and improved coordination.  Patient to discharge at a wheelchair level mod to max A for transfers and S for w/c mobility. Pt's family is unable to provide the level of assist needed for pt at this time, so d/c plan is SNF.  Reasons goals not met: Pt still inconsistent with basic transfers from mod A to max A using slideboard on level surfaces and max A for squat pivot technique.   Recommendation:  Patient will benefit from ongoing skilled PT services in skilled nursing facility setting to continue to advance safe functional mobility, address ongoing impairments in balance, strength, trunk control, functional mobility, endurance, pain, sensation, and minimize fall risk.  Equipment: None. To be provided at [facility].  Reasons for discharge: discharge from hospital  Patient/family agrees with progress made and goals achieved: Yes  PT Discharge Precautions/Restrictions Precautions Precautions: Fall Required Braces or Orthoses: Cervical Brace Other Brace/Splint: Cervical hard collar with thoracic brace component Restrictions Weight Bearing Restrictions: No Pain Pain Assessment Pain  Assessment: No/denies pain Vision/Perception  Vision - History Baseline Vision: No visual deficits Patient Visual Report: No change from baseline Vision - Assessment Eye Alignment: Within Functional Limits Perception Perception: Within Functional Limits Praxis Praxis: Intact  Cognition Overall Cognitive Status: Impaired Arousal/Alertness: Awake/alert Orientation Level: Oriented X4 Attention: Focused Focused Attention: Appears intact Memory: Appears Impaired Memory Impairment: Decreased recall of new information Awareness: Appears intact Safety/Judgment: Appears intact Sensation Sensation Light Touch: Appears Intact Coordination Gross Motor Movements are Fluid and Coordinated: No Coordination and Movement Description: decreased coordination in LE's (LLE > RLE) Motor  Motor Motor: Ataxia;Abnormal postural alignment and control;Tetraplegia  Locomotion  Ambulation Ambulation/Gait Assistance: 1: +2 Total assist  Trunk/Postural Assessment  Cervical Assessment Cervical Assessment: Exceptions to [facility] (cervical collar; NT) Thoracic Assessment Thoracic Assessment: Exceptions to [facility] (limited due to brace) Lumbar Assessment Lumbar Assessment: Exceptions to [facility] (posterior pelvic tilt) Postural Control Postural Control: Deficits on evaluation Trunk Control: decreased when outside limits of stability  Balance Static Sitting Balance Static Sitting - Level of Assistance: 5: Stand by assistance Dynamic Sitting Balance Dynamic Sitting - Level of Assistance: 5: Stand by assistance (with UE support) Static Standing Balance Static Standing - Level of Assistance: 3: Mod assist;4: Min assist (using [name]) Dynamic Standing Balance Dynamic Standing - Level of Assistance: 3: Mod assist Extremity Assessment  RLE Assessment RLE Assessment: Exceptions to [facility] (3+/5 hip; 4/5 knee and ankle) LLE Assessment LLE Assessment: Exceptions to [facility] (2/5 hip, 4/5 knee ext/flex, ankle 3+/5 df 3/5  pf)  See FIM for current functional status  [name] [facility] 04/09/2013, 10:11 AM

## 2013-04-09 NOTE — Progress Notes (Signed)
Recreational Therapy Discharge Summary Patient Details  Name: Cassandra Walker MRN: [id number] Date of Birth: [birth year] Today's Date: 04/09/2013  Long term goals set: 1  Long term goals met: 1  Comments on progress toward goals: Pt has made great progress toward goal meeting supervision-set up assist for completion of simple TR tasks seated.  Pt remains extremely motivated as she discharges to SNF today for continued therapies & 24 hour assist. Reasons for discharge: discharge from hospital Patient/family agrees with progress made and goals achieved: Yes  Sema Stangler 04/09/2013, 8:24 AM

## 2013-04-09 NOTE — Plan of Care (Signed)
Problem: RH SKIN INTEGRITY Goal: RH STG SKIN FREE OF INFECTION/BREAKDOWN Skin Free of infection/ breakdown with Mod I .  Outcome: Completed/Met Date Met:  04/09/13 No skin breakdown evident

## 2013-04-09 NOTE — Progress Notes (Signed)
Social Work  Discharge Note  The overall goal for the admission was met for:   Discharge location: No - plan changed to SNF  Length of Stay: Yes  Discharge activity level: Yes  Home/community participation: Yes  Services provided included: MD, RD, PT, OT, SLP, RN, TR, Pharmacy and SW  Financial Services: Medicare  Follow-up services arranged: Other: SNF at [facility]  Comments (or additional information):  Patient/Family verbalized understanding of follow-up arrangements: Yes  Individual responsible for coordination of the follow-up plan: pt and grandaughter  Confirmed correct DME delivered: NA    Daylyn Christine

## 2013-04-09 NOTE — Progress Notes (Signed)
Discharge note and session note reviewed and both  accurately reflect discharge summary and treatment session.   

## 2013-04-09 NOTE — Plan of Care (Signed)
Problem: RH SKIN INTEGRITY Goal: RH STG MAINTAIN SKIN INTEGRITY WITH ASSISTANCE STG Maintain Skin Integrity With mod I Assistance.  Outcome: Completed/Met Date Met:  04/09/13 No skin breakdown evident

## 2013-04-10 ENCOUNTER — Other Ambulatory Visit: Payer: Self-pay | Admitting: *Deleted

## 2013-04-10 MED ORDER — ALPRAZOLAM 0.5 MG PO TBDP: ORAL_TABLET | ORAL | Status: DC

## 2013-04-10 MED ORDER — OXYCODONE HCL 10 MG PO TABS: 10.0000 mg | ORAL_TABLET | ORAL | Status: DC | PRN

## 2013-04-13 ENCOUNTER — Non-Acute Institutional Stay (SKILLED_NURSING_FACILITY): Payer: Medicare Other | Admitting: Internal Medicine

## 2013-04-13 DIAGNOSIS — S51009A Unspecified open wound of unspecified elbow, initial encounter: Secondary | ICD-10-CM

## 2013-04-13 DIAGNOSIS — N319 Neuromuscular dysfunction of bladder, unspecified: Secondary | ICD-10-CM

## 2013-04-13 DIAGNOSIS — S51019A Laceration without foreign body of unspecified elbow, initial encounter: Secondary | ICD-10-CM

## 2013-04-13 DIAGNOSIS — G8254 Quadriplegia, C5-C7 incomplete: Secondary | ICD-10-CM

## 2013-04-13 DIAGNOSIS — I10 Essential (primary) hypertension: Secondary | ICD-10-CM

## 2013-04-13 DIAGNOSIS — H832X9 Labyrinthine dysfunction, unspecified ear: Secondary | ICD-10-CM

## 2013-04-13 NOTE — Progress Notes (Signed)
Patient ID: Cassandra Walker, female   DOB: 02-18-1932, 79 y.o.   MRN: [id number]    PCP: Marisue Brooklyn, DO  Code Status: full code  Allergies  Allergen Reactions  . Codeine     Chief Complaint: new admit post hospitalization 03/07/13- 04/09/13  HPI:  79 y/o female patient was admitted to hospital post accident with a lawn mower causing spinal cord injury leading to incomplete tetraplegia. MRI C-spine revealed anterior wedge compression fracture of C7 vertebral body, bilateral C6-C7 facet fractures with jumped C6 facet, relative retrolisthesis of compressed C7 with cord compression and edema. She underwent ORIF C6-C7 with fixation /fusion C4-C7 by Dr. Wynetta Emery. She was then fitted for thoracic brace with cervical and had improvement in strength but continued to have lower extremity weakness. She also had neurogenic bladder requiring intermittent catheterization. She was then transferred to physical medicine and rehab. She underwent therapy with PT, OT including bowel/ bladder program. She was treated for uti with urine growing citrobacter. She required bowel regimen to help with her bowel movement. Her pain medications were titrated. Given her need for further therapy, she was sent to SNF for rehab. She was seen in her room today. She has her brace and cervical collar on and is sitting on her wheel chair. She mentions her pain being under control. Complaints of spasms on her left leg making it stiff. She is able to urinate by herself. Has been having regular bowel movement. She feels she is getting stronger and her appetite is coming back. See ros  Review of Systems  Constitutional: Positive for malaise/fatigue. Negative for fever, chills and diaphoresis.  HENT: Positive for neck pain. Negative for congestion.   Eyes: Negative for blurred vision.  Respiratory: Negative for cough, shortness of breath and wheezing.   Cardiovascular: Negative for chest pain, palpitations and leg swelling.   Gastrointestinal: Negative for heartburn, nausea, vomiting, abdominal pain and constipation.  Genitourinary: Negative for dysuria and flank pain.  Musculoskeletal: Positive for back pain.  Skin: Negative for rash.  Neurological: Positive for weakness. Negative for dizziness, tremors, seizures and headaches.  Psychiatric/Behavioral: Negative for depression and memory loss.    Past Medical History  Diagnosis Date  . Osteoarthritis   . Hyperlipidemia   . GERD (gastroesophageal reflux disease)   . Peripheral neuropathy   . Osteopenia   . DDD (degenerative disc disease)   . Shingles   . Colon cancer 1977  . Thyroid cancer 1966  . Peripheral vascular disease    Past Surgical History  Procedure Laterality Date  . Thyroidectomy    . Total abdominal hysterectomy w/ bilateral salpingoophorectomy  1968  . Colon resection  1977  . Cervical laminectomy  1995  . Cataract extraction      bilateral  . Breast enhancement surgery      silicone  . Breast surgery    . Posterior cervical fusion/foraminotomy N/A 03/07/2013    Procedure: Cervical four -seven POSTERIOR CERVICAL FUSION;  Surgeon: Mariam Dollar, MD;  Location: [facility];  Service: Neurosurgery;  Laterality: N/A;   Social History:   reports that she quit smoking about 40 years ago. She does not have any smokeless tobacco history on file. Her alcohol and drug histories are not on file.  Family History  Problem Relation Age of Onset  . Lung cancer Father     Medications: Patient's Medications  New Prescriptions   No medications on file  Previous Medications   ACETAMINOPHEN (TYLENOL) 325 MG TABLET  Take 1-2 tablets (325-650 mg total) by mouth every 4 (four) hours as needed.   ALPRAZOLAM (NIRAVAM) 0.5 MG DISSOLVABLE TABLET    Take one tablet by mouth at bedtime as needed for sleep   ATENOLOL (TENORMIN) 25 MG TABLET    Take 1 tablet (25 mg total) by mouth 2 (two) times daily.   ATORVASTATIN (LIPITOR) 10 MG TABLET    Take 5 mg  by mouth daily.     BISACODYL (DULCOLAX) 10 MG SUPPOSITORY    Place 1 suppository (10 mg total) rectally daily at 6 (six) AM.   DICLOFENAC SODIUM (VOLTAREN) 1 % GEL    Apply 2 g topically 4 (four) times daily. To right shoulder   GABAPENTIN (NEURONTIN) 100 MG CAPSULE    Take 2 capsules (200 mg total) by mouth 3 (three) times daily.   GUAIFENESIN (MUCINEX) 600 MG 12 HR TABLET    Take 1 tablet (600 mg total) by mouth 2 (two) times daily.   IPRATROPIUM-ALBUTEROL (COMBIVENT) 20-100 MCG/ACT AERS RESPIMAT    Inhale 2 puffs into the lungs every 6 (six) hours as needed for wheezing or shortness of breath.   LEVOTHYROXINE (SYNTHROID, LEVOTHROID) 75 MCG TABLET    Take 75 mcg by mouth daily.     LIDOCAINE (XYLOCAINE) 2 % JELLY    Apply topically as needed (Use with in and out catheter).   LISINOPRIL (PRINIVIL,ZESTRIL) 20 MG TABLET    Take 1 tablet (20 mg total) by mouth daily.   MECLIZINE (ANTIVERT) 25 MG TABLET    Take 1 tablet (25 mg total) by mouth 3 (three) times daily as needed for dizziness or nausea.   METHOCARBAMOL (ROBAXIN) 500 MG TABLET    Take 1 tablet (500 mg total) by mouth 4 (four) times daily.   OXYCODONE HCL 10 MG TABS    Take 1 tablet (10 mg total) by mouth every 3 (three) hours as needed.   SACCHAROMYCES BOULARDII (FLORASTOR) 250 MG CAPSULE    Take 1 capsule (250 mg total) by mouth 2 (two) times daily.   SENNA-DOCUSATE (SENOKOT-S) 8.6-50 MG PER TABLET    Take 2 tablets by mouth at bedtime.   TRAMADOL (ULTRAM) 50 MG TABLET    Take 1 tablet (50 mg total) by mouth every 6 (six) hours.  Modified Medications   No medications on file  Discontinued Medications   ALPRAZOLAM (XANAX) 1 MG TABLET    Take 0.5 tablets (0.5 mg total) by mouth at bedtime as needed for sleep.    Physical Exam: BP 133/80  Pulse 78  Temp(Src) 97.3 F (36.3 C)  Resp 19  gen- elderly female in NAD HEENT- has neck brace with thoracic extension, PERRLA, MMM cvs- ns 1,s2, rrr respi- b/l CTA, no wheeze or  rhonchi abdo- bs+, soft, non tender Ext- strength 4/5 in RUE, 5/5 in LUE, 4/5 in both LE, sensation intact, able to move all 4, spasticity in LLE but otherwise normal tone in others Neuro- aaox 3, see above Skin- small scrape in both elbows, no signs of infection, has bandage applied in both with dressing clean   Labs reviewed: Basic Metabolic Panel:  Recent Labs  66/44/03 0430 03/12/13 0344  03/19/13 0615  04/01/13 0543 04/06/13 0535 04/07/13 0500  NA 136 136  < > 135  --  139 133*  --   K 3.4* 3.1*  < > 3.7  --  4.1 3.9  --   CL 100 98  < > 94*  --  97 93*  --  CO2 27 28  < > 30  --  31 30  --   GLUCOSE 131* 122*  < > 105*  --  101* 104*  --   BUN 22 24*  < > 25*  --  41* 28*  --   CREATININE 0.79 0.77  < > 0.93  < > 1.17* 1.07 0.93  CALCIUM 7.8* 7.9*  < > 8.7  --  9.0 8.6  --   MG  --  2.0  --   --   --   --   --   --   < > = values in this interval not displayed. Liver Function Tests:  Recent Labs  03/07/13 2115 03/08/13 0447 03/18/13 0725  AST 33 34 15  ALT 18 17 14   ALKPHOS 61 58 82  BILITOT 0.2* 0.3 0.3  PROT 6.2 6.2 5.7*  ALBUMIN 3.2* 3.2* 2.3*    CBC:  Recent Labs  03/07/13 1421  03/18/13 0725 03/19/13 0615 04/01/13 0543  WBC 20.2*  < > 15.6* 13.8* 6.0  NEUTROABS 16.6*  --  10.8*  --   --   HGB 12.4  < > 10.6* 11.8* 10.5*  HCT 35.5*  < > 32.0* 35.8* 32.0*  MCV 83.7  < > 85.3 85.6 86.3  PLT 278  < > 346 353 400  < > = values in this interval not displayed.  CBG:  Recent Labs  03/19/13 1619 03/22/13 2035 03/25/13 1635  GLUCAP 111* 145* 107*   04/10/13 wbc 6.6, hb 10.9, hct 35.3, plt 348, na 137, k 4.5, glu 86, bun 20, cr 0.8  Radiological Exams:  Dg Abd 1 View  04/06/2013   *RADIOLOGY REPORT*  Clinical Data: Nausea.  Constipation.  ABDOMEN - 1 VIEW  Comparison: 03/18/2013  Findings: The no evidence of dilated bowel loops.  Moderate stool seen throughout majority the colon.  Vascular calcifications and phleboliths noted.  IMPRESSION: No  acute findings.  Moderate stool burden noted.   Original Report Authenticated By: Myles Rosenthal, M.D.   Dg Cerv Spine 1v Clearing  03/16/2013   *RADIOLOGY REPORT*  Clinical Data: C6-7 fracture.  LIMITED CERVICAL SPINE FOR TRAUMA CLEARING - 1 VIEW  Comparison: 03/09/2013  Findings: Changes of posterior fusion from C4-C7.  It is difficult to visualize the lower cervical spine due to overlying shoulders despite the swimmer's view.  There appears to be normal alignment.  IMPRESSION: Despite this swimmer's view, the lower cervical spine remains difficult to visualize.  There does appear to be normal alignment. Otherwise, fine bony detail is obscured due to overlying shoulders.   Original Report Authenticated By: Charlett Nose, M.D.    Assessment/Plan  Reviewed labs post admission to facility. Patient has been working with therapy team and is making improvement  cervical fracture with incomplete quadriplegia- She needs to wear neck brace with thoracic extension at all times. Her strength has improvedWill continue her bowel regimen including bowel massage. Her strength is improving. Continue working with PT/OT. Has follow up appointment. With her spasticity, will increase her robaxin to 750 mg qid and reassess  Neurogenic bladder- able to urinate by herself. Will keep straight cath four times a day for now following voiding trial only  Skin tear- likely from elbow hitting on the arm rest of the wheelchair. Will clean area and apply padding to prevent further tear  HTN- reviewed current medications. bp stable. Continue current regimen  Dizziness- in setting of vestibular dysfunction. Continue with conservative approach for now, brace with  recent fracture limits this though. Her symptoms are improved  Family/ staff Communication: reviewed care plan with patient and nursing supervisor   Labs/tests ordered- cbc, cmp

## 2013-04-30 ENCOUNTER — Other Ambulatory Visit: Payer: Self-pay | Admitting: *Deleted

## 2013-04-30 MED ORDER — ALPRAZOLAM 0.5 MG PO TABS: ORAL_TABLET | ORAL | Status: DC

## 2013-05-06 ENCOUNTER — Encounter: Payer: Self-pay | Admitting: Physical Medicine & Rehabilitation

## 2013-05-06 ENCOUNTER — Emergency Department (HOSPITAL_COMMUNITY)
Admission: EM | Admit: 2013-05-06 | Discharge: 2013-05-06 | Disposition: A | Discharge status: Home / Self Care | Payer: Medicare Other | Attending: Emergency Medicine | Admitting: Emergency Medicine

## 2013-05-06 ENCOUNTER — Ambulatory Visit (HOSPITAL_COMMUNITY)
Admission: RE | Admit: 2013-05-06 | Discharge: 2013-05-06 | Disposition: A | Discharge status: Home / Self Care | Payer: Medicare Other | Source: Ambulatory Visit | Attending: Physical Medicine & Rehabilitation | Admitting: Physical Medicine & Rehabilitation

## 2013-05-06 ENCOUNTER — Other Ambulatory Visit (HOSPITAL_COMMUNITY): Payer: Self-pay | Admitting: Physical Medicine & Rehabilitation

## 2013-05-06 ENCOUNTER — Telehealth: Payer: Self-pay | Admitting: *Deleted

## 2013-05-06 ENCOUNTER — Encounter: Payer: Medicare Other | Attending: Physical Medicine & Rehabilitation | Admitting: Physical Medicine & Rehabilitation

## 2013-05-06 ENCOUNTER — Encounter (HOSPITAL_COMMUNITY): Payer: Self-pay | Admitting: Emergency Medicine

## 2013-05-06 VITALS — BP 125/48 | HR 66 | Resp 14 | Ht 65.5 in | Wt 150.8 lb

## 2013-05-06 DIAGNOSIS — Z872 Personal history of diseases of the skin and subcutaneous tissue: Secondary | ICD-10-CM | POA: Insufficient documentation

## 2013-05-06 DIAGNOSIS — I82409 Acute embolism and thrombosis of unspecified deep veins of unspecified lower extremity: Secondary | ICD-10-CM

## 2013-05-06 DIAGNOSIS — I82402 Acute embolism and thrombosis of unspecified deep veins of left lower extremity: Secondary | ICD-10-CM

## 2013-05-06 DIAGNOSIS — Z85038 Personal history of other malignant neoplasm of large intestine: Secondary | ICD-10-CM | POA: Insufficient documentation

## 2013-05-06 DIAGNOSIS — M7989 Other specified soft tissue disorders: Secondary | ICD-10-CM

## 2013-05-06 DIAGNOSIS — M545 Low back pain, unspecified: Secondary | ICD-10-CM | POA: Insufficient documentation

## 2013-05-06 DIAGNOSIS — E785 Hyperlipidemia, unspecified: Secondary | ICD-10-CM | POA: Insufficient documentation

## 2013-05-06 DIAGNOSIS — M25569 Pain in unspecified knee: Secondary | ICD-10-CM

## 2013-05-06 DIAGNOSIS — Z8679 Personal history of other diseases of the circulatory system: Secondary | ICD-10-CM | POA: Insufficient documentation

## 2013-05-06 DIAGNOSIS — I82403 Acute embolism and thrombosis of unspecified deep veins of lower extremity, bilateral: Secondary | ICD-10-CM

## 2013-05-06 DIAGNOSIS — S12500S Unspecified displaced fracture of sixth cervical vertebra, sequela: Secondary | ICD-10-CM

## 2013-05-06 DIAGNOSIS — H832X3 Labyrinthine dysfunction, bilateral: Secondary | ICD-10-CM

## 2013-05-06 DIAGNOSIS — Z8739 Personal history of other diseases of the musculoskeletal system and connective tissue: Secondary | ICD-10-CM | POA: Insufficient documentation

## 2013-05-06 DIAGNOSIS — Z87891 Personal history of nicotine dependence: Secondary | ICD-10-CM | POA: Insufficient documentation

## 2013-05-06 DIAGNOSIS — M51379 Other intervertebral disc degeneration, lumbosacral region without mention of lumbar back pain or lower extremity pain: Secondary | ICD-10-CM | POA: Insufficient documentation

## 2013-05-06 DIAGNOSIS — G8254 Quadriplegia, C5-C7 incomplete: Secondary | ICD-10-CM

## 2013-05-06 DIAGNOSIS — M79609 Pain in unspecified limb: Secondary | ICD-10-CM | POA: Insufficient documentation

## 2013-05-06 DIAGNOSIS — G609 Hereditary and idiopathic neuropathy, unspecified: Secondary | ICD-10-CM | POA: Insufficient documentation

## 2013-05-06 DIAGNOSIS — Z85850 Personal history of malignant neoplasm of thyroid: Secondary | ICD-10-CM | POA: Insufficient documentation

## 2013-05-06 DIAGNOSIS — S12600S Unspecified displaced fracture of seventh cervical vertebra, sequela: Secondary | ICD-10-CM

## 2013-05-06 DIAGNOSIS — Z79899 Other long term (current) drug therapy: Secondary | ICD-10-CM | POA: Insufficient documentation

## 2013-05-06 DIAGNOSIS — IMO0002 Reserved for concepts with insufficient information to code with codable children: Secondary | ICD-10-CM | POA: Insufficient documentation

## 2013-05-06 DIAGNOSIS — M5137 Other intervertebral disc degeneration, lumbosacral region: Secondary | ICD-10-CM | POA: Insufficient documentation

## 2013-05-06 DIAGNOSIS — X58XXXA Exposure to other specified factors, initial encounter: Secondary | ICD-10-CM | POA: Insufficient documentation

## 2013-05-06 DIAGNOSIS — R339 Retention of urine, unspecified: Secondary | ICD-10-CM

## 2013-05-06 DIAGNOSIS — H832X9 Labyrinthine dysfunction, unspecified ear: Secondary | ICD-10-CM

## 2013-05-06 DIAGNOSIS — Z8719 Personal history of other diseases of the digestive system: Secondary | ICD-10-CM | POA: Insufficient documentation

## 2013-05-06 HISTORY — DX: Acute embolism and thrombosis of unspecified deep veins of left lower extremity: I82.402

## 2013-05-06 LAB — POCT I-STAT, CHEM 8
BUN: 16 mg/dL (ref 6–23)
Calcium, Ion: 1.14 mmol/L (ref 1.13–1.30)
Chloride: 100 mEq/L (ref 96–112)
Creatinine, Ser: 1.2 mg/dL — ABNORMAL HIGH (ref 0.50–1.10)
Glucose, Bld: 100 mg/dL — ABNORMAL HIGH (ref 70–99)
HCT: 35 % — ABNORMAL LOW (ref 36.0–46.0)
Hemoglobin: 11.9 g/dL — ABNORMAL LOW (ref 12.0–15.0)
Potassium: 4.8 mEq/L (ref 3.5–5.1)
Sodium: 138 mEq/L (ref 135–145)
TCO2: 27 mmol/L (ref 0–100)

## 2013-05-06 MED ORDER — RIVAROXABAN 20 MG PO TABS: 20.0000 mg | ORAL_TABLET | Freq: Every day | ORAL | Status: DC

## 2013-05-06 MED ORDER — RIVAROXABAN 15 MG PO TABS
15.0000 mg | ORAL_TABLET | Freq: Two times a day (BID) | ORAL | Status: DC
  Administered 2013-05-06: 15 mg via ORAL
  Filled 2013-05-06: qty 1

## 2013-05-06 MED ORDER — RIVAROXABAN 15 MG PO TABS: 15.0000 mg | ORAL_TABLET | Freq: Two times a day (BID) | ORAL | Status: DC

## 2013-05-06 MED ORDER — ENOXAPARIN SODIUM 100 MG/ML ~~LOC~~ SOLN: 1.0000 mg/kg | Freq: Once | SUBCUTANEOUS | Status: DC

## 2013-05-06 NOTE — Progress Notes (Signed)
Subjective:    Patient ID: Cassandra Walker, female    DOB: [birth year], 79 y.o.   MRN: [id number]  HPI  Cassandra Walker is here in follow up of her cervical injury and with myelopathy. She is at [facility]. She has complained of low back and left leg pain which has been a problem since at the facility (over the last week or so in particular).  She has noticed swelling as well in the left leg. The leg feels weaker since then as well. The pain in her neck and arms is improved.   For pain she is essentially getting tramadol, robaxin and tylenol which seem to work well.   She is emptying her bladder now and bowels in the toilet.  She had been walking more at the facility until the left leg acted up. Now it's hard for her to stand up, but she does a little better once she's up. When she's standing the left thigh will give away.    Pain Inventory Average Pain 6 Pain Right Now 2 My pain is intermittent, stabbing and aching  In the last 24 hours, has pain interfered with the following? General activity 5 Relation with others 2 Enjoyment of life 5 What TIME of day is your pain at its worst? night Sleep (in general) Good  Pain is worse with: walking Pain improves with: medication Relief from Meds: 8  Mobility walk with assistance how many minutes can you walk? 5 ability to climb steps?  no do you drive?  no use a wheelchair needs help with transfers  Function not employed: date last employed 2001 I need assistance with the following:  dressing, bathing, toileting, meal prep, household duties and shopping  Neuro/Psych trouble walking  Prior Studies Any changes since last visit?  no  Physicians involved in your care Any changes since last visit?  no   Family History  Problem Relation Age of Onset  . Lung cancer Father    History   Social History  . Marital Status: Widowed    Spouse Name: N/A    Number of Children: 4  . Years of Education: N/A    Occupational History  . retired from [employer]    Social History Main Topics  . Smoking status: Former Smoker -- 1.00 packs/day for 20 years    Quit date: 11/05/1972  . Smokeless tobacco: Never Used  . Alcohol Use: None  . Drug Use: None  . Sexually Active: None   Other Topics Concern  . None   Social History Narrative  . None   Past Surgical History  Procedure Laterality Date  . Thyroidectomy    . Total abdominal hysterectomy w/ bilateral salpingoophorectomy  1968  . Colon resection  1977  . Cervical laminectomy  1995  . Cataract extraction      bilateral  . Breast enhancement surgery      silicone  . Breast surgery    . Posterior cervical fusion/foraminotomy N/A 03/07/2013    Procedure: Cervical four -seven POSTERIOR CERVICAL FUSION;  Surgeon: Cassandra Dollar, MD;  Location: [facility];  Service: Neurosurgery;  Laterality: N/A;   Past Medical History  Diagnosis Date  . Osteoarthritis   . Hyperlipidemia   . GERD (gastroesophageal reflux disease)   . Peripheral neuropathy   . Osteopenia   . DDD (degenerative disc disease)   . Shingles   . Colon cancer 1977  . Thyroid cancer 1966  . Peripheral vascular disease  BP 125/48  Pulse 66  Resp 14  Ht 5' 5.5" (1.664 m)  Wt 150 lb 12.8 oz (68.402 kg)  BMI 24.7 kg/m2  SpO2 95%   Review of Systems  Cardiovascular: Positive for leg swelling.  Musculoskeletal: Positive for gait problem.  All other systems reviewed and are negative.       Objective:   Physical Exam  General: Alert and oriented x 3, No apparent distress. She is wearing her collar which is too loose. HEENT: Head is normocephalic, atraumatic, PERRLA, EOMI, sclera anicteric, oral mucosa pink and moist, dentition intact, ext ear canals clear,  Neck: Supple without JVD or lymphadenopathy Heart: Reg rate and rhythm. No murmurs rubs or gallops Chest: CTA bilaterally without wheezes, rales, or rhonchi; no distress Abdomen: Soft, non-tender,  non-distended, bowel sounds positive. Extremities: No clubbing, cyanosis, 1+ edema is noted throughout left lower extremity. Limb is tender with palpation along the left calf and anterior thigh. homan's test equivocal to positive. Leg is not warm. . Pulses are 2+ Skin: Clean and intact without signs of breakdown Neuro: Pt is cognitively appropriate with improved insight, memory, and awareness. Cranial nerves 2-12 are intact. No nystagmus. Sensory exam is decreased slightly left more than right.  Reflexes are 1+ in uppers, 2+ lowers. . Fine motor coordination is intact. No tremors. Motor function is 3-4/5 in UE prox to distal. She has decreased FMC in both hands. RLE is 3 prox to 4/5 distally. LLE is 2- prox to 4- distally.  Musculoskeletal: low back tender. SLR equivocal. Denied substantial pain with basic AROM and PROM of the left leg.  Psych: Pt's affect is appropriate. Pt is cooperative        Assessment & Plan:  1. Functional deficits secondary to C6-7 fx's with C6 SCI ASIA C, mild TBI 2. LLE swelling, concerning for a DVT. She has low back pain but it doesn't seem to be necessarily associated with her leg sx 3. Mild reactive mood decrease   Plan: 1. Lower extremity venous dopplers to rule out DVT. Test arranged for today. 2. Xrays of lumbar spine to assess for stenosis and potential instability 3. Continue with current therapy as tolerated.  4. No med changes today. 5. Elevate left leg as tolerated. Use THT etc, 6. Follow up with me in 6 weeks. Overall she's made very nice progress and her prospects for ambulation are very good. 30 minutes of face to face patient care time were spent during this visit. All questions were encouraged and answered.

## 2013-05-06 NOTE — ED Provider Notes (Signed)
History    CSN: [id number] Arrival date & time 05/06/13  1435  First MD Initiated Contact with Patient 05/06/13 1501     Chief Complaint  Patient presents with  . DVT   (Consider location/radiation/quality/duration/timing/severity/associated sxs/prior Treatment) Patient is a 79 y.o. female presenting with leg pain. The history is provided by the patient. No language interpreter was used.  Leg Pain Location:  Leg Pain details:    Quality:  Aching Associated symptoms: no fever   Associated symptoms comment:  The patient is undergoing inpatient rehab following a cervical spinal fracture, surgically treated on Mar 07, 2013, and resulting in resolving quadriplegia. Over the last several days, she has had increasing swelling into the left greater than right leg causing pain. No shortness of breath or chest pain. She was sent here by Dr. Riley Kill (Rehab MD) for management of DVT found on outpatient doppler studies done earlier today, showing extensive DVT to left leg and clots distally in right leg.   Past Medical History  Diagnosis Date  . Osteoarthritis   . Hyperlipidemia   . GERD (gastroesophageal reflux disease)   . Peripheral neuropathy   . Osteopenia   . DDD (degenerative disc disease)   . Shingles   . Colon cancer 1977  . Thyroid cancer 1966  . Peripheral vascular disease    Past Surgical History  Procedure Laterality Date  . Thyroidectomy    . Total abdominal hysterectomy w/ bilateral salpingoophorectomy  1968  . Colon resection  1977  . Cervical laminectomy  1995  . Cataract extraction      bilateral  . Breast enhancement surgery      silicone  . Breast surgery    . Posterior cervical fusion/foraminotomy N/A 03/07/2013    Procedure: Cervical four -seven POSTERIOR CERVICAL FUSION;  Surgeon: Mariam Dollar, MD;  Location: [facility];  Service: Neurosurgery;  Laterality: N/A;   Family History  Problem Relation Age of Onset  . Lung cancer Father    History  Substance Use  Topics  . Smoking status: Former Smoker -- 1.00 packs/day for 20 years    Quit date: 11/05/1972  . Smokeless tobacco: Never Used  . Alcohol Use: Not on file   OB History   Grav Para Term Preterm Abortions TAB SAB Ect Mult Living                 Review of Systems  Constitutional: Negative for fever and chills.  Respiratory: Negative.  Negative for shortness of breath.   Cardiovascular: Negative.  Negative for chest pain.  Gastrointestinal: Negative.  Negative for nausea and vomiting.  Musculoskeletal:       See HPI.  Skin: Negative.  Negative for color change and rash.  Neurological: Negative.  Negative for numbness.    Allergies  Codeine  Home Medications   Current Outpatient Rx  Name  Route  Sig  Dispense  Refill  . acetaminophen (TYLENOL) 325 MG tablet   Oral   Take 650 mg by mouth every 6 (six) hours as needed. For pain         . ALPRAZolam (XANAX) 0.5 MG tablet      Take one tablet by mouth every night at bedtime as needed for sleep and anxiety   30 tablet   5   . atenolol (TENORMIN) 25 MG tablet   Oral   Take 1 tablet (25 mg total) by mouth 2 (two) times daily.         Marland Kitchen  atorvastatin (LIPITOR) 10 MG tablet   Oral   Take 5 mg by mouth every evening.          . bisacodyl (DULCOLAX) 10 MG suppository   Rectal   Place 1 suppository (10 mg total) rectally daily at 6 (six) AM.   12 suppository   0   . diclofenac sodium (VOLTAREN) 1 % GEL   Topical   Apply 2 g topically 4 (four) times daily. To right shoulder         . gabapentin (NEURONTIN) 100 MG capsule   Oral   Take 2 capsules (200 mg total) by mouth 3 (three) times daily.         Marland Kitchen guaiFENesin (MUCINEX) 600 MG 12 hr tablet   Oral   Take 1 tablet (600 mg total) by mouth 2 (two) times daily.         Marland Kitchen levothyroxine (SYNTHROID, LEVOTHROID) 75 MCG tablet   Oral   Take 75 mcg by mouth daily.           Marland Kitchen lidocaine (XYLOCAINE) 2 % jelly   Topical   Apply topically as needed (Use with in  and out catheter).   30 mL   0   . lisinopril (PRINIVIL,ZESTRIL) 20 MG tablet   Oral   Take 1 tablet (20 mg total) by mouth daily.         . meclizine (ANTIVERT) 25 MG tablet   Oral   Take 1 tablet (25 mg total) by mouth 3 (three) times daily as needed for dizziness or nausea.   30 tablet   0   . methocarbamol (ROBAXIN) 500 MG tablet   Oral   Take 250 mg by mouth every 8 (eight) hours.         . ondansetron (ZOFRAN) 4 MG tablet   Oral   Take 4 mg by mouth every 8 (eight) hours as needed for nausea.         . Oxycodone HCl 10 MG TABS   Oral   Take 1 tablet (10 mg total) by mouth every 3 (three) hours as needed.   360 tablet   0   . saccharomyces boulardii (FLORASTOR) 250 MG capsule   Oral   Take 1 capsule (250 mg total) by mouth 2 (two) times daily.         Marland Kitchen senna-docusate (SENOKOT-S) 8.6-50 MG per tablet   Oral   Take 2 tablets by mouth at bedtime.         . traMADol (ULTRAM) 50 MG tablet   Oral   Take 50 mg by mouth every 8 (eight) hours.         . Ipratropium-Albuterol (COMBIVENT) 20-100 MCG/ACT AERS respimat   Inhalation   Inhale 2 puffs into the lungs every 6 (six) hours as needed for wheezing or shortness of breath.          BP 158/67  Pulse 74  Temp(Src) 98.7 F (37.1 C) (Oral)  Resp 16  SpO2 100% Physical Exam  Constitutional: She is oriented to person, place, and time. She appears well-developed and well-nourished.  HENT:  Head: Normocephalic.  Neck: Normal range of motion. Neck supple.  Cardiovascular: Normal rate, regular rhythm and intact distal pulses.   No murmur heard. Pulmonary/Chest: Effort normal and breath sounds normal.  Abdominal: Soft. Bowel sounds are normal. There is no tenderness. There is no rebound and no guarding.  Musculoskeletal: Normal range of motion. She exhibits edema.  Left lower extremity swelling  without redness. No swelling to right leg. Tender calf on left, not on right.   Neurological: She is alert and  oriented to person, place, and time.  Skin: Skin is warm and dry. No rash noted.  Psychiatric: She has a normal mood and affect.    ED Course  Procedures (including critical care time) Labs Reviewed  POCT I-STAT, CHEM 8 - Abnormal; Notable for the following:    Creatinine, Ser 1.20 (*)    Glucose, Bld 100 (*)    Hemoglobin 11.9 (*)    HCT 35.0 (*)    All other components within normal limits   Dg Lumbar Spine Complete  05/06/2013   *RADIOLOGY REPORT*  Clinical Data: Left low back pain.  LUMBAR SPINE - COMPLETE 4+ VIEW  Comparison: 01/17/2012  Findings: Alignment is normal except for a 5 mm of retrolisthesis at L2-3 which is chronic.  There is mild disc space narrowing throughout the lumbar region.  No evidence of fracture.  There are mild lower lumbar facet degenerative changes as seen previously.  IMPRESSION: No change since the previous examination.  Ordinary mild disc space narrowing and lower lumbar facet degeneration.   Original Report Authenticated By: Paulina Fusi, M.D.   No diagnosis found. 1. Bilateral DVT MDM  Doppler studies reviewed and found positive for DVT bilaterally, left with extensive clotting throughout length of leg. She is not having symptoms of PE and VSS. Discussed with Dr. Riley Kill. Will start on Xarelto and discharge to his care for further rehab.   Arnoldo Hooker, PA-C 05/06/13 1721

## 2013-05-06 NOTE — Patient Instructions (Signed)
DOPPLERS OF THE LEG AND LUMBAR SPINE HAVE BEEN ORDERED.   I WILL CONTACT THE FACILITY WITH FURTHER INSTRUCTIONS PENDING THE RESULTS OF THESE TESTS

## 2013-05-06 NOTE — Progress Notes (Signed)
*  Preliminary Results* Bilateral lower extremity venous duplex completed. The right common femoral vein was difficult to compress, suggestive of a proximal obstruction.  Bilateral lower extremities were positive for extensive, acute, deep vein thrombosis of the right posterior tibial and peroneal veins, as well as the left saphenofemoral junction, common femoral, femoral, profunda femoral, popliteal, posterior tibial, and peroneal veins.  Preliminary results discussed with Dr.Swartz.  05/06/2013 2:08 PM Gertie Fey, RVT, RDCS, RDMS

## 2013-05-06 NOTE — ED Notes (Signed)
Pt was seen at pcp today for left leg swelling and was sent here for doppler study. Doppler showed she was positive for DVT. Pt recently had a neck fracture and stays at a skilled nursing facility. Pt denies any pain and any sob.

## 2013-05-06 NOTE — ED Notes (Signed)
Pt sent here positive for DVT in left leg; pt had neck fx last month

## 2013-05-06 NOTE — Telephone Encounter (Signed)
Notified Elon Jester in vascular lab that Dr Riley Kill wants Mayah taken to the ED for admit due to her extensive occlusion of left leg.  Also notified Martie Lee chg nurse [facility] ED that Ms Barreira would be being transported to ED from vascular lab for admission for anticoag tx and needs to be seen for workup asap.  [name] was notified of Ms Kovalcik's pending admission.

## 2013-05-07 ENCOUNTER — Telehealth: Payer: Self-pay | Admitting: *Deleted

## 2013-05-07 NOTE — Telephone Encounter (Signed)
Need to know how long to hold therapy ( ED d/c with instructions to hold therapy) and wondering if they can continue OT but just hold PT.

## 2013-05-07 NOTE — Telephone Encounter (Signed)
Notified Kim Dr Riley Kill said for her to remain on bedrest until Saturday and then may resume therapies

## 2013-05-11 NOTE — ED Provider Notes (Signed)
Medical screening examination/treatment/procedure(s) were performed by non-physician practitioner and as supervising physician I was immediately available for consultation/collaboration.  Meryl Hubers, MD 05/11/13 0823 

## 2013-05-25 ENCOUNTER — Other Ambulatory Visit: Payer: Self-pay | Admitting: Neurosurgery

## 2013-05-25 DIAGNOSIS — S129XXA Fracture of neck, unspecified, initial encounter: Secondary | ICD-10-CM

## 2013-05-27 ENCOUNTER — Ambulatory Visit
Admission: RE | Admit: 2013-05-27 | Discharge: 2013-05-27 | Disposition: A | Discharge status: Home / Self Care | Payer: Medicare Other | Source: Ambulatory Visit | Attending: Neurosurgery | Admitting: Neurosurgery

## 2013-05-27 DIAGNOSIS — S129XXA Fracture of neck, unspecified, initial encounter: Secondary | ICD-10-CM

## 2013-06-09 ENCOUNTER — Non-Acute Institutional Stay (SKILLED_NURSING_FACILITY): Payer: Medicare Other | Admitting: Adult Health

## 2013-06-09 DIAGNOSIS — I82409 Acute embolism and thrombosis of unspecified deep veins of unspecified lower extremity: Secondary | ICD-10-CM

## 2013-06-09 DIAGNOSIS — F411 Generalized anxiety disorder: Secondary | ICD-10-CM

## 2013-06-09 DIAGNOSIS — I10 Essential (primary) hypertension: Secondary | ICD-10-CM

## 2013-06-09 DIAGNOSIS — K5900 Constipation, unspecified: Secondary | ICD-10-CM

## 2013-06-09 DIAGNOSIS — F419 Anxiety disorder, unspecified: Secondary | ICD-10-CM

## 2013-06-09 DIAGNOSIS — I82402 Acute embolism and thrombosis of unspecified deep veins of left lower extremity: Secondary | ICD-10-CM

## 2013-06-09 DIAGNOSIS — E785 Hyperlipidemia, unspecified: Secondary | ICD-10-CM

## 2013-06-09 DIAGNOSIS — E039 Hypothyroidism, unspecified: Secondary | ICD-10-CM

## 2013-06-09 DIAGNOSIS — G8254 Quadriplegia, C5-C7 incomplete: Secondary | ICD-10-CM

## 2013-06-18 ENCOUNTER — Non-Acute Institutional Stay (SKILLED_NURSING_FACILITY): Payer: Medicare Other | Admitting: Adult Health

## 2013-06-18 DIAGNOSIS — E785 Hyperlipidemia, unspecified: Secondary | ICD-10-CM

## 2013-06-18 DIAGNOSIS — S12400S Unspecified displaced fracture of fifth cervical vertebra, sequela: Secondary | ICD-10-CM

## 2013-06-18 DIAGNOSIS — I82402 Acute embolism and thrombosis of unspecified deep veins of left lower extremity: Secondary | ICD-10-CM

## 2013-06-18 DIAGNOSIS — E039 Hypothyroidism, unspecified: Secondary | ICD-10-CM

## 2013-06-18 DIAGNOSIS — IMO0002 Reserved for concepts with insufficient information to code with codable children: Secondary | ICD-10-CM

## 2013-06-18 DIAGNOSIS — I82409 Acute embolism and thrombosis of unspecified deep veins of unspecified lower extremity: Secondary | ICD-10-CM

## 2013-06-19 ENCOUNTER — Encounter: Payer: Self-pay | Admitting: Physical Medicine & Rehabilitation

## 2013-06-19 ENCOUNTER — Encounter: Payer: Medicare Other | Attending: Physical Medicine & Rehabilitation | Admitting: Physical Medicine & Rehabilitation

## 2013-06-19 VITALS — BP 135/75 | HR 69 | Resp 14 | Ht 65.0 in | Wt 160.0 lb

## 2013-06-19 DIAGNOSIS — M720 Palmar fascial fibromatosis [Dupuytren]: Secondary | ICD-10-CM

## 2013-06-19 DIAGNOSIS — S12500D Unspecified displaced fracture of sixth cervical vertebra, subsequent encounter for fracture with routine healing: Secondary | ICD-10-CM

## 2013-06-19 DIAGNOSIS — M545 Low back pain, unspecified: Secondary | ICD-10-CM | POA: Insufficient documentation

## 2013-06-19 DIAGNOSIS — S12600D Unspecified displaced fracture of seventh cervical vertebra, subsequent encounter for fracture with routine healing: Secondary | ICD-10-CM

## 2013-06-19 DIAGNOSIS — G8254 Quadriplegia, C5-C7 incomplete: Secondary | ICD-10-CM

## 2013-06-19 DIAGNOSIS — M7989 Other specified soft tissue disorders: Secondary | ICD-10-CM | POA: Insufficient documentation

## 2013-06-19 DIAGNOSIS — X58XXXA Exposure to other specified factors, initial encounter: Secondary | ICD-10-CM | POA: Insufficient documentation

## 2013-06-19 DIAGNOSIS — M79609 Pain in unspecified limb: Secondary | ICD-10-CM | POA: Insufficient documentation

## 2013-06-19 DIAGNOSIS — IMO0002 Reserved for concepts with insufficient information to code with codable children: Secondary | ICD-10-CM

## 2013-06-19 NOTE — Patient Instructions (Signed)
1. AGGRESSIVE RANGE OF MOTION TO BOTH HANDS AND WRIST. EMPLOY HEAT PRIOR TO STRETCHING. 2. BILATERAL RESTING WRIST HAND ORTHOSES TO BE WORN AT NIGHT. 3. PT NEEDS KNEE HIGH TEDS APPLIED IN THE MORNING AND TAKEN OFF AT BEDTIME. SHE SHOULD ELEVATE LEGS WHILE SHE'S IN BED SLEEPING.

## 2013-06-19 NOTE — Progress Notes (Signed)
Subjective:    Patient ID: [facility], female    DOB: [birth year], 79 y.o.   MRN: [id number]  HPI  Mrs. Munley is back regarding her cervical myelopathy. She has been gaining strength in her legs moreso than the upper extremities. She is walking with therapies, and has walked up to 152ft or more, using a walker, working on steps. She uses the wheelchair for daily activities but transfers with minimal help. She can push her wheelchair on her own. She's continent of bowel and bladder.   Her hands remain tight. They have been using putty and e-stim as well ROM exercises.   Her left leg is still swollen and is improved but is stil inhibiting her because of its size.    Pain Inventory Average Pain 2 Pain Right Now 1 My pain is intermittent and aching  In the last 24 hours, has pain interfered with the following? General activity 0 Relation with others 0 Enjoyment of life 0 What TIME of day is your pain at its worst? all Sleep (in general) Good  Pain is worse with: some activites Pain improves with: medication Relief from Meds: 10  Mobility use a walker use a wheelchair needs help with transfers  Function I need assistance with the following:  bathing, meal prep, household duties and shopping  Neuro/Psych trouble walking  Prior Studies Any changes since last visit?  no  Physicians involved in your care Any changes since last visit?  no   Family History  Problem Relation Age of Onset  . Lung cancer Father    History   Social History  . Marital Status: Widowed    Spouse Name: N/A    Number of Children: 4  . Years of Education: N/A   Occupational History  . retired from [employer]    Social History Main Topics  . Smoking status: Former Smoker -- 1.00 packs/day for 20 years    Quit date: 11/05/1972  . Smokeless tobacco: Never Used  . Alcohol Use: None  . Drug Use: None  . Sexual Activity: None   Other Topics Concern  . None   Social History Narrative   . None   Past Surgical History  Procedure Laterality Date  . Thyroidectomy    . Total abdominal hysterectomy w/ bilateral salpingoophorectomy  1968  . Colon resection  1977  . Cervical laminectomy  1995  . Cataract extraction      bilateral  . Breast enhancement surgery      silicone  . Breast surgery    . Posterior cervical fusion/foraminotomy N/A 03/07/2013    Procedure: Cervical four -seven POSTERIOR CERVICAL FUSION;  Surgeon: Mariam Dollar, MD;  Location: [facility];  Service: Neurosurgery;  Laterality: N/A;   Past Medical History  Diagnosis Date  . Osteoarthritis   . Hyperlipidemia   . GERD (gastroesophageal reflux disease)   . Peripheral neuropathy   . Osteopenia   . DDD (degenerative disc disease)   . Shingles   . Colon cancer 1977  . Thyroid cancer 1966  . Peripheral vascular disease   . Deep vein blood clot of left lower extremity 05/06/13   BP 135/75  Pulse 69  Resp 14  Ht 5\' 5"  (1.651 m)  Wt 160 lb (72.576 kg)  BMI 26.63 kg/m2  SpO2 97%    Review of Systems  Cardiovascular: Positive for leg swelling.       Left leg swelling was better but has increased again  Musculoskeletal: Positive for gait  problem.  All other systems reviewed and are negative.       Objective:   Physical Exam  General: Alert and oriented x 3, No apparent distress. She is wearing her collar which is too loose.  HEENT: Head is normocephalic, atraumatic, PERRLA, EOMI, sclera anicteric, oral mucosa pink and moist, dentition intact, ext ear canals clear,  Neck: Supple without JVD or lymphadenopathy  Heart: Reg rate and rhythm. No murmurs rubs or gallops  Chest: CTA bilaterally without wheezes, rales, or rhonchi; no distress  Abdomen: Soft, non-tender, non-distended, bowel sounds positive.  Extremities: No clubbing, cyanosis, 1+ edema is noted throughout left lower extremity. Limb is tender with palpation along the left calf and anterior thigh. homan's test equivocal to positive. Leg  is not warm. . Pulses are 2+  Skin: healing pressure sore on right heel. Neuro: Pt is cognitively appropriate with improved insight, memory, and awareness. Cranial nerves 2-12 are intact. No nystagmus. Sensory exam is decreased slightly left more than right. Reflexes are 1+ in uppers, 2+ lowers. . Fine motor coordination is intact. No tremors. Motor function is  4/5 in UE prox to distal. She has decreased FMC in both hands with contractures in palm and fingers noted bilaterally. underyling strength is 3+/5. RLE is 4/5 HF, KE and ankle 4+, LLE is 4-prox to 4/5 distally..  Musculoskeletal: low back tender. l. Denied substantial pain with basic AROM and PROM of the left leg.  Psych: Pt's affect is appropriate. Pt is cooperative   Assessment & Plan:   1. Functional deficits secondary to C6-7 fx's with C6 SCI ASIA C, mild TBI  2. LLE swelling, concerning for a DVT. She has low back pain but it doesn't seem to be necessarily associated with her leg sx  3. Mild reactive mood decrease  4. Dupuytren's contractures of hands, with additional contracture related to her SCI  Plan:  1. Aggressive UE ROM exercises to the wrists and finger flexors she has lost ROM due to contracture from the palms distally.  WHO to be worn QHS were also requested. 2. Knee high stockings daily. On in AM and off in PM. Xarelto to continue as well for a period of 6 months. 3. Continue with current therapy as tolerated addressing the above in addition to ongoing gait training and lower extremity strength  4. Consider surgical intervention for hands if she fails to progress.   5. Elevate left leg as tolerated. Use THT etc,  6. Follow up with me in 2 months. .30 minutes of face to face patient care time were spent during this visit. All questions were encouraged and answered. I would imagine that the pt/family could transition home soon especially if her balance and stamina further improve.

## 2013-06-24 ENCOUNTER — Other Ambulatory Visit: Payer: Self-pay | Admitting: Geriatric Medicine

## 2013-06-24 MED ORDER — RIVAROXABAN 20 MG PO TABS: 20.0000 mg | ORAL_TABLET | Freq: Every day | ORAL | Status: DC

## 2013-06-24 MED ORDER — METHOCARBAMOL 500 MG PO TABS: 250.0000 mg | ORAL_TABLET | Freq: Three times a day (TID) | ORAL | Status: DC

## 2013-06-28 ENCOUNTER — Encounter: Payer: Self-pay | Admitting: Adult Health

## 2013-06-28 NOTE — Assessment & Plan Note (Signed)
Is no longer on c-collar; is taking robaxin 250 mg every 8 hours routinely and ultram 50 mg every 8 hours routinely for pain management is taking neurontin 200 mg three times daily for neuropathic pain. Will continue oxycodone 10 mg every 3 hours as needed for pain and will continue volatern gel to right shoulder 2 gm four itmes daily

## 2013-06-28 NOTE — Assessment & Plan Note (Signed)
Is stable will continue lisinopril 20 mg daily will continue atenolol 25 mg twice daily and will monitor

## 2013-06-28 NOTE — Assessment & Plan Note (Signed)
Will continue xarelto 20 mg daily and will monitor

## 2013-06-29 DIAGNOSIS — E039 Hypothyroidism, unspecified: Secondary | ICD-10-CM | POA: Insufficient documentation

## 2013-06-29 DIAGNOSIS — F419 Anxiety disorder, unspecified: Secondary | ICD-10-CM | POA: Insufficient documentation

## 2013-06-29 DIAGNOSIS — E782 Mixed hyperlipidemia: Secondary | ICD-10-CM | POA: Insufficient documentation

## 2013-06-29 NOTE — Assessment & Plan Note (Signed)
Will continue synthroid 75 mcg daily  

## 2013-06-29 NOTE — Assessment & Plan Note (Signed)
Will continue xanax 0.5 mg nightly as needed and will monitor

## 2013-06-29 NOTE — Progress Notes (Signed)
Patient ID: Cassandra Walker, female   DOB: 08/24/1932, 79 y.o.   MRN: [id number]  [facility]  Allergies  Allergen Reactions  . Codeine Other (See Comments)    Jittery, nervous & hallucinations     Chief Complaint  Patient presents with  . Medical Managment of Chronic Issues    HPI: She is being seen for the management of her chronic illnesses. There are no concerns being voiced by the nursing staff at this time. Her c-collar has been removed. She states she is feeling good and is looking forward to going home in the near future.   Past Medical History  Diagnosis Date  . Osteoarthritis   . Hyperlipidemia   . GERD (gastroesophageal reflux disease)   . Peripheral neuropathy   . Osteopenia   . DDD (degenerative disc disease)   . Shingles   . Colon cancer 1977  . Thyroid cancer 1966  . Peripheral vascular disease   . Deep vein blood clot of left lower extremity 05/06/13    Past Surgical History  Procedure Laterality Date  . Thyroidectomy    . Total abdominal hysterectomy w/ bilateral salpingoophorectomy  1968  . Colon resection  1977  . Cervical laminectomy  1995  . Cataract extraction      bilateral  . Breast enhancement surgery      silicone  . Breast surgery    . Posterior cervical fusion/foraminotomy N/A 03/07/2013    Procedure: Cervical four -seven POSTERIOR CERVICAL FUSION;  Surgeon: Mariam Dollar, MD;  Location: [facility];  Service: Neurosurgery;  Laterality: N/A;    VITAL SIGNS BP 118/75  Pulse 70  Ht 5\' 5"  (1.651 m)  Wt 151 lb (68.493 kg)  BMI 25.13 kg/m2   Patient's Medications  New Prescriptions   No medications on file  Previous Medications   ACETAMINOPHEN (TYLENOL) 325 MG TABLET    Take 650 mg by mouth every 6 (six) hours as needed. For pain   ALPRAZOLAM (XANAX) 0.5 MG TABLET    Take one tablet by mouth every night at bedtime as needed for sleep and anxiety   ATENOLOL (TENORMIN) 25 MG TABLET    Take 1 tablet (25 mg total) by mouth 2 (two) times  daily.   ATORVASTATIN (LIPITOR) 10 MG TABLET    Take 5 mg by mouth every evening.    BISACODYL (DULCOLAX) 10 MG SUPPOSITORY    Place 1 suppository (10 mg total) rectally daily at 6 (six) AM.   DICLOFENAC SODIUM (VOLTAREN) 1 % GEL    Apply 2 g topically 4 (four) times daily. To right shoulder   GABAPENTIN (NEURONTIN) 100 MG CAPSULE    Take 2 capsules (200 mg total) by mouth 3 (three) times daily.   GUAIFENESIN (MUCINEX) 600 MG 12 HR TABLET    Take 1 tablet (600 mg total) by mouth 2 (two) times daily.   IPRATROPIUM-ALBUTEROL (COMBIVENT) 20-100 MCG/ACT AERS RESPIMAT    Inhale 2 puffs into the lungs every 6 (six) hours as needed for wheezing or shortness of breath.   LEVOTHYROXINE (SYNTHROID, LEVOTHROID) 75 MCG TABLET    Take 75 mcg by mouth daily.     LIDOCAINE (XYLOCAINE) 2 % JELLY    Apply topically as needed (Use with in and out catheter).   LISINOPRIL (PRINIVIL,ZESTRIL) 20 MG TABLET    Take 1 tablet (20 mg total) by mouth daily.   MECLIZINE (ANTIVERT) 25 MG TABLET    Take 1 tablet (25 mg total) by mouth 3 (three)  times daily as needed for dizziness or nausea.   METHOCARBAMOL (ROBAXIN) 500 MG TABLET    Take 0.5 tablets (250 mg total) by mouth every 8 (eight) hours.   ONDANSETRON (ZOFRAN) 4 MG TABLET    Take 4 mg by mouth every 8 (eight) hours as needed for nausea.   OXYCODONE HCL 10 MG TABS    Take 1 tablet (10 mg total) by mouth every 3 (three) hours as needed.   RIVAROXABAN (XARELTO) 20 MG TABS TABLET    Take 1 tablet (20 mg total) by mouth daily.   SACCHAROMYCES BOULARDII (FLORASTOR) 250 MG CAPSULE    Take 1 capsule (250 mg total) by mouth 2 (two) times daily.   SENNA-DOCUSATE (SENOKOT-S) 8.6-50 MG PER TABLET    Take 2 tablets by mouth at bedtime.   TRAMADOL (ULTRAM) 50 MG TABLET    Take 50 mg by mouth every 8 (eight) hours.  Modified Medications   No medications on file  Discontinued Medications   No medications on file    SIGNIFICANT DIAGNOSTIC EXAMS   LABS REVIEWED:   04-10-13: wbc  6.6; hgb 10.9; hct 35.3; mcv 887. ;plt 348; glucose 86; bun 20; creat 0.8; k+4.5; na++137 05-01-13: chol 138; ldl 76; trig 139; tsh 2.3510    Review of Systems  Constitutional: Negative for malaise/fatigue.  HENT: Negative for neck pain.   Eyes: Negative for blurred vision.  Respiratory: Negative for cough and shortness of breath.   Cardiovascular: Negative for chest pain, palpitations and leg swelling.  Gastrointestinal: Negative for heartburn, abdominal pain and constipation.  Musculoskeletal: Negative for myalgias and joint pain.  Skin: Negative.   Neurological: Negative for headaches.  Psychiatric/Behavioral: The patient does not have insomnia.     Physical Exam  Constitutional: She appears well-developed and well-nourished.  Eyes: Pupils are equal, round, and reactive to light.  Neck: Neck supple. No JVD present. No thyromegaly present.  Cardiovascular: Normal rate, regular rhythm and intact distal pulses.   Respiratory: Effort normal and breath sounds normal. No respiratory distress. She has no wheezes.  GI: Soft. Bowel sounds are normal. She exhibits no distension.  Musculoskeletal: She exhibits no edema.  Is able to move all extremities; has lower extremity weakness present.   Neurological: She is alert.  Skin: Skin is warm and dry.  Psychiatric: She has a normal mood and affect.       ASSESSMENT/ PLAN:  Left leg DVT Will continue xarelto 20 mg daily and will monitor   HTN (hypertension) Is stable will continue lisinopril 20 mg daily will continue atenolol 25 mg twice daily and will monitor   Incomplete quadriplegia at C5-6 level Is no longer on c-collar; is taking robaxin 250 mg every 8 hours routinely and ultram 50 mg every 8 hours routinely for pain management is taking neurontin 200 mg three times daily for neuropathic pain. Will continue oxycodone 10 mg every 3 hours as needed for pain and will continue volatern gel to right shoulder 2 gm four itmes daily    Constipation Will continue her senna s 2 tabs twice daily and florastor twice daily and will monitor   Hypothyroidism Will continue synthroid 75 mcg daily   Anxiety Will continue xanax 0.5 mg nightly as needed and will monitor   Dyslipidemia Will continue lipitor 5 mg daily    Time spent with patient 50 minutes.

## 2013-06-29 NOTE — Assessment & Plan Note (Addendum)
Will continue her senna s 2 tabs twice daily and florastor twice daily and will monitor

## 2013-06-29 NOTE — Assessment & Plan Note (Signed)
Will continue lipitor 5 mg daily

## 2013-06-30 ENCOUNTER — Telehealth: Payer: Self-pay | Admitting: Physical Medicine & Rehabilitation

## 2013-06-30 DIAGNOSIS — IMO0002 Reserved for concepts with insufficient information to code with codable children: Secondary | ICD-10-CM

## 2013-06-30 DIAGNOSIS — M720 Palmar fascial fibromatosis [Dupuytren]: Secondary | ICD-10-CM

## 2013-06-30 DIAGNOSIS — G8254 Quadriplegia, C5-C7 incomplete: Secondary | ICD-10-CM

## 2013-06-30 NOTE — Telephone Encounter (Signed)
Cassandra Walker came to window requesting a prescription from Dr. Riley Kill, needing a wheelchair ramp.  She has spoken with [employer] and they told her she would need a script for this.  Any questions please call her.

## 2013-06-30 NOTE — Telephone Encounter (Signed)
Order for wheelchair ramp placed and mailed to patient.

## 2013-07-08 ENCOUNTER — Telehealth: Payer: Self-pay

## 2013-07-08 DIAGNOSIS — G8254 Quadriplegia, C5-C7 incomplete: Secondary | ICD-10-CM

## 2013-07-08 NOTE — Telephone Encounter (Signed)
[facility] is requesting an order for a hospital bed.  Please advise.

## 2013-07-08 NOTE — Telephone Encounter (Signed)
Order faxed to [phone] 

## 2013-07-08 NOTE — Telephone Encounter (Signed)
Ok

## 2013-07-15 ENCOUNTER — Other Ambulatory Visit: Payer: Self-pay | Admitting: Adult Health

## 2013-07-15 ENCOUNTER — Telehealth: Payer: Self-pay

## 2013-07-15 NOTE — Telephone Encounter (Signed)
Trey Paula with [facility] called to get plan of care approval.  Verbal given and he will fax order.

## 2013-07-20 NOTE — Progress Notes (Signed)
Patient ID: Cassandra Walker, female   DOB: 03/08/1932, 79 y.o.   MRN: [id number]  [facility]  Allergies  Allergen Reactions  . Codeine Other (See Comments)    Jittery, nervous & hallucinations    Chief Complaint  Patient presents with  . Discharge Note    HPI She is being discharged to home after being hospitalized for a posterior cervical spine fusion; after suffering a spinal cord injury at home. She has had a prolonged rehab at this facility and is ready for discharge back home with home health for pt/ot/nursing. She will need a wheelchair in order for her to maintain her level of independence with her basic adl functioning such as grooming and tioleting. She will need a shower bench for her independence with bathing. She will need her prescriptions to be written.   Past Medical History  Diagnosis Date  . Osteoarthritis   . Hyperlipidemia   . GERD (gastroesophageal reflux disease)   . Peripheral neuropathy   . Osteopenia   . DDD (degenerative disc disease)   . Shingles   . Colon cancer 1977  . Thyroid cancer 1966  . Peripheral vascular disease   . Deep vein blood clot of left lower extremity 05/06/13    Past Surgical History  Procedure Laterality Date  . Thyroidectomy    . Total abdominal hysterectomy w/ bilateral salpingoophorectomy  1968  . Colon resection  1977  . Cervical laminectomy  1995  . Cataract extraction      bilateral  . Breast enhancement surgery      silicone  . Breast surgery    . Posterior cervical fusion/foraminotomy N/A 03/07/2013    Procedure: Cervical four -seven POSTERIOR CERVICAL FUSION;  Surgeon: Mariam Dollar, MD;  Location: [facility];  Service: Neurosurgery;  Laterality: N/A;    Current Outpatient Prescriptions on File Prior to Visit  Medication Sig Dispense Refill  . acetaminophen (TYLENOL) 325 MG tablet Take 650 mg by mouth every 6 (six) hours as needed. For pain      . ALPRAZolam (XANAX) 0.5 MG tablet Take one tablet by mouth every night  at bedtime as needed for sleep and anxiety  30 tablet  5  . atenolol (TENORMIN) 25 MG tablet Take 1 tablet (25 mg total) by mouth 2 (two) times daily.      Marland Kitchen atorvastatin (LIPITOR) 10 MG tablet Take 5 mg by mouth every evening.       . bisacodyl (DULCOLAX) 10 MG suppository Place 1 suppository (10 mg total) rectally daily at 6 (six) AM.  12 suppository  0  . diclofenac sodium (VOLTAREN) 1 % GEL Apply 2 g topically 4 (four) times daily. To right shoulder      . gabapentin (NEURONTIN) 100 MG capsule Take 2 capsules (200 mg total) by mouth 3 (three) times daily.      Marland Kitchen guaiFENesin (MUCINEX) 600 MG 12 hr tablet Take 1 tablet (600 mg total) by mouth 2 (two) times daily.      . Ipratropium-Albuterol (COMBIVENT) 20-100 MCG/ACT AERS respimat Inhale 2 puffs into the lungs every 6 (six) hours as needed for wheezing or shortness of breath.      . levothyroxine (SYNTHROID, LEVOTHROID) 75 MCG tablet Take 75 mcg by mouth daily.        Marland Kitchen lidocaine (XYLOCAINE) 2 % jelly Apply topically as needed (Use with in and out catheter).  30 mL  0  . lisinopril (PRINIVIL,ZESTRIL) 20 MG tablet Take 1 tablet (20 mg total)  by mouth daily.      . meclizine (ANTIVERT) 25 MG tablet Take 1 tablet (25 mg total) by mouth 3 (three) times daily as needed for dizziness or nausea.  30 tablet  0  . ondansetron (ZOFRAN) 4 MG tablet Take 4 mg by mouth every 8 (eight) hours as needed for nausea.      . Oxycodone HCl 10 MG TABS Take 1 tablet (10 mg total) by mouth every 3 (three) hours as needed.  360 tablet  0  . saccharomyces boulardii (FLORASTOR) 250 MG capsule Take 1 capsule (250 mg total) by mouth 2 (two) times daily.      Marland Kitchen senna-docusate (SENOKOT-S) 8.6-50 MG per tablet Take 2 tablets by mouth at bedtime.      . traMADol (ULTRAM) 50 MG tablet Take 50 mg by mouth every 8 (eight) hours.       No current facility-administered medications on file prior to visit.    Filed Vitals:   07/20/13 1407  BP: 126/78  Pulse: 68  Height: 5\' 6"   (1.676 m)  Weight: 151 lb 9.6 oz (68.765 kg)      04-10-13: wbc 6.6; hgb 10.9; hct 35.3; mcv 887. ;plt 348; glucose 86; bun 20; creat 0.8; k+4.5; na++137 05-01-13: chol 138; ldl 76; trig 139; tsh 2.3510    Review of Systems  Constitutional: Negative for malaise/fatigue.  HENT: Negative for neck pain.   Eyes: Negative for blurred vision.  Respiratory: Negative for cough and shortness of breath.   Cardiovascular: Negative for chest pain, palpitations and leg swelling.  Gastrointestinal: Negative for heartburn, abdominal pain and constipation.  Musculoskeletal: Negative for myalgias and joint pain.  Skin: Negative.   Neurological: Negative for headaches.  Psychiatric/Behavioral: The patient does not have insomnia.     Physical Exam  Constitutional: She appears well-developed and well-nourished.  Eyes: Pupils are equal, round, and reactive to light.  Neck: Neck supple. No JVD present. No thyromegaly present.  Cardiovascular: Normal rate, regular rhythm and intact distal pulses.   Respiratory: Effort normal and breath sounds normal. No respiratory distress. She has no wheezes.  GI: Soft. Bowel sounds are normal. She exhibits no distension.  Musculoskeletal: She exhibits no edema.  Is able to move all extremities; has lower extremity weakness present.   Neurological: She is alert.  Skin: Skin is warm and dry.  Psychiatric: She has a normal mood and affect.   ASSESSMENT/PLAN  Will discharge her home with home health for pt/ot/nursing. Will need a wheelchair and shower bench. Her prescriptions have been written    Time spent with patient 45 minutes.

## 2013-07-21 ENCOUNTER — Telehealth: Payer: Self-pay

## 2013-07-21 NOTE — Telephone Encounter (Signed)
Trey Paula from [facility] called and said that he had completed a assessment on the patient. The patient is weightbearing with walker but is having localized pain in the right groin area. Trey Paula is requesting the patient be seen ASAP and her hip be assessed because of the pain level.

## 2013-07-22 NOTE — Telephone Encounter (Signed)
Left message for patients daughter [name] to call office and schedule an earlier appointment for the pain the patient is having.

## 2013-07-23 ENCOUNTER — Other Ambulatory Visit (HOSPITAL_COMMUNITY): Payer: Self-pay | Admitting: Internal Medicine

## 2013-07-23 ENCOUNTER — Other Ambulatory Visit: Payer: Self-pay | Admitting: Neurosurgery

## 2013-07-23 ENCOUNTER — Ambulatory Visit (HOSPITAL_COMMUNITY)
Admission: RE | Admit: 2013-07-23 | Discharge: 2013-07-23 | Disposition: A | Discharge status: Home / Self Care | Payer: Medicare Other | Source: Ambulatory Visit | Attending: Internal Medicine | Admitting: Internal Medicine

## 2013-07-23 DIAGNOSIS — M25559 Pain in unspecified hip: Secondary | ICD-10-CM | POA: Insufficient documentation

## 2013-07-23 DIAGNOSIS — R52 Pain, unspecified: Secondary | ICD-10-CM

## 2013-07-23 DIAGNOSIS — S129XXA Fracture of neck, unspecified, initial encounter: Secondary | ICD-10-CM

## 2013-07-24 ENCOUNTER — Telehealth: Payer: Self-pay

## 2013-07-24 NOTE — Telephone Encounter (Signed)
FYI:: Patient's granddaughterPorfirio Mylar) called and said the patient received a X-Ray of her hip on 07/23/13 like recommended.

## 2013-07-31 ENCOUNTER — Other Ambulatory Visit: Payer: Medicare Other

## 2013-07-31 ENCOUNTER — Other Ambulatory Visit: Payer: Self-pay | Admitting: Neurosurgery

## 2013-07-31 ENCOUNTER — Ambulatory Visit
Admission: RE | Admit: 2013-07-31 | Discharge: 2013-07-31 | Disposition: A | Discharge status: Home / Self Care | Payer: Medicare Other | Source: Ambulatory Visit | Attending: Neurosurgery | Admitting: Neurosurgery

## 2013-07-31 DIAGNOSIS — S129XXD Fracture of neck, unspecified, subsequent encounter: Secondary | ICD-10-CM

## 2013-08-05 ENCOUNTER — Telehealth: Payer: Self-pay

## 2013-08-05 NOTE — Telephone Encounter (Signed)
Rodman Comp with [facility] called to let us know that patients wound is worsening and would like verbal orders to initiate care.  Please advise.

## 2013-08-05 NOTE — Telephone Encounter (Signed)
Please re-initiate care. thanks

## 2013-08-06 NOTE — Telephone Encounter (Signed)
Left message for Cassandra Walker with [facility] to initiate wound care.  Then spoke with patients granddaughter Cassandra Walker to make her aware.  She is going to follow up with patient today and will call if an earlier appointment is needed.

## 2013-08-07 ENCOUNTER — Encounter: Payer: Self-pay | Admitting: Physical Medicine & Rehabilitation

## 2013-08-07 ENCOUNTER — Encounter: Payer: Medicare Other | Attending: Physical Medicine & Rehabilitation | Admitting: Physical Medicine & Rehabilitation

## 2013-08-07 ENCOUNTER — Telehealth: Payer: Self-pay

## 2013-08-07 ENCOUNTER — Ambulatory Visit
Admission: RE | Admit: 2013-08-07 | Discharge: 2013-08-07 | Disposition: A | Discharge status: Home / Self Care | Payer: Medicare Other | Source: Ambulatory Visit | Attending: Physical Medicine & Rehabilitation | Admitting: Physical Medicine & Rehabilitation

## 2013-08-07 VITALS — BP 123/56 | HR 72 | Resp 16 | Ht 65.0 in | Wt 160.0 lb

## 2013-08-07 DIAGNOSIS — S91301A Unspecified open wound, right foot, initial encounter: Secondary | ICD-10-CM

## 2013-08-07 DIAGNOSIS — S069X9S Unspecified intracranial injury with loss of consciousness of unspecified duration, sequela: Secondary | ICD-10-CM | POA: Insufficient documentation

## 2013-08-07 DIAGNOSIS — L8992 Pressure ulcer of unspecified site, stage 2: Secondary | ICD-10-CM

## 2013-08-07 DIAGNOSIS — L89609 Pressure ulcer of unspecified heel, unspecified stage: Secondary | ICD-10-CM

## 2013-08-07 DIAGNOSIS — I82402 Acute embolism and thrombosis of unspecified deep veins of left lower extremity: Secondary | ICD-10-CM

## 2013-08-07 DIAGNOSIS — M545 Low back pain: Secondary | ICD-10-CM

## 2013-08-07 DIAGNOSIS — L89612 Pressure ulcer of right heel, stage 2: Secondary | ICD-10-CM

## 2013-08-07 DIAGNOSIS — X58XXXS Exposure to other specified factors, sequela: Secondary | ICD-10-CM | POA: Insufficient documentation

## 2013-08-07 DIAGNOSIS — E785 Hyperlipidemia, unspecified: Secondary | ICD-10-CM | POA: Insufficient documentation

## 2013-08-07 DIAGNOSIS — I739 Peripheral vascular disease, unspecified: Secondary | ICD-10-CM | POA: Insufficient documentation

## 2013-08-07 DIAGNOSIS — Z85038 Personal history of other malignant neoplasm of large intestine: Secondary | ICD-10-CM | POA: Insufficient documentation

## 2013-08-07 DIAGNOSIS — S069XAS Unspecified intracranial injury with loss of consciousness status unknown, sequela: Secondary | ICD-10-CM | POA: Insufficient documentation

## 2013-08-07 DIAGNOSIS — K219 Gastro-esophageal reflux disease without esophagitis: Secondary | ICD-10-CM | POA: Insufficient documentation

## 2013-08-07 DIAGNOSIS — S91309A Unspecified open wound, unspecified foot, initial encounter: Secondary | ICD-10-CM

## 2013-08-07 DIAGNOSIS — M720 Palmar fascial fibromatosis [Dupuytren]: Secondary | ICD-10-CM | POA: Insufficient documentation

## 2013-08-07 DIAGNOSIS — L988 Other specified disorders of the skin and subcutaneous tissue: Secondary | ICD-10-CM | POA: Insufficient documentation

## 2013-08-07 DIAGNOSIS — Z85850 Personal history of malignant neoplasm of thyroid: Secondary | ICD-10-CM | POA: Insufficient documentation

## 2013-08-07 DIAGNOSIS — IMO0002 Reserved for concepts with insufficient information to code with codable children: Secondary | ICD-10-CM | POA: Insufficient documentation

## 2013-08-07 DIAGNOSIS — G609 Hereditary and idiopathic neuropathy, unspecified: Secondary | ICD-10-CM | POA: Insufficient documentation

## 2013-08-07 DIAGNOSIS — I82409 Acute embolism and thrombosis of unspecified deep veins of unspecified lower extremity: Secondary | ICD-10-CM

## 2013-08-07 MED ORDER — DOXYCYCLINE HYCLATE 50 MG PO CAPS: 100.0000 mg | ORAL_CAPSULE | Freq: Two times a day (BID) | ORAL | Status: DC

## 2013-08-07 MED ORDER — COLLAGENASE 250 UNIT/GM EX OINT: TOPICAL_OINTMENT | Freq: Every day | CUTANEOUS | Status: DC

## 2013-08-07 NOTE — Telephone Encounter (Signed)
i attempted to call but there was no answer. xrays showed no abnormalities on the heel consistent with osteomyelitis. There were some irregularities of the big toe which have no relation to the potential problem we were looking for. Continue with current plan discussed in clinic today.

## 2013-08-07 NOTE — Telephone Encounter (Signed)
Left message for Cassandra Walker to call office regarding wound care orders for patient.

## 2013-08-07 NOTE — Telephone Encounter (Signed)
Loraine from [facility] called to get verbal orders to change patients wound care for her heel. Pt. Is coming in for a appt. Today at 12:20. Please advise.

## 2013-08-07 NOTE — Telephone Encounter (Signed)
WOUND CARE  GENTLY SCRUB THE RIGHT HEEL WOUND WITH GAUZE.  APPLY SANTYL OINTMENT TO WOUND WITH Q-TIP  PACK WITH SMALL PIECE OF MOISTENED (WITH SALINE) GAUZE  COVER WITH DRY 4X4 PIECE OF GAUZE  WRAP WITH KERLIX  CHANGE DAILY

## 2013-08-07 NOTE — Progress Notes (Signed)
Subjective:    Patient ID: Cassandra Walker, female    DOB: [birth year], 79 y.o.   MRN: [id number]  HPI  Cassandra Walker is here in regard to a right heel wound which has worsened. I have not specifically treated this wound as it developed while she was at the SNF. I was contacted, however, by her [facility] who reported it was worsening. Her daughter wanted me to see it and give them recommendations. She has been trying to keep the foot elevated. It has become painful with pressure and even contact at times. They are dressing it daily at this point with gauze.  Pain Inventory Average Pain 8 Pain Right Now 8 My pain is sharp  In the last 24 hours, has pain interfered with the following? General activity 7 Relation with others 0 Enjoyment of life 7 What TIME of day is your pain at its worst? morning and night Sleep (in general) Fair  Pain is worse with: walking and standing Pain improves with: n/a Relief from Meds: n/a  Mobility walk with assistance use a walker ability to climb steps?  yes do you drive?  no use a wheelchair needs help with transfers Do you have any goals in this area?  yes  Function retired I need assistance with the following:  dressing, bathing, toileting, meal prep, household duties and shopping  Neuro/Psych numbness trouble walking  Prior Studies x-rays CT/MRI  Physicians involved in your care Dr Wynetta Emery, Dr Neysa Hotter   Family History  Problem Relation Age of Onset  . Lung cancer Father    History   Social History  . Marital Status: Widowed    Spouse Name: N/A    Number of Children: 4  . Years of Education: N/A   Occupational History  . retired from [employer]    Social History Main Topics  . Smoking status: Former Smoker -- 1.00 packs/day for 20 years    Quit date: 11/05/1972  . Smokeless tobacco: Never Used  . Alcohol Use: None  . Drug Use: None  . Sexual Activity: None   Other Topics Concern  . None   Social History Narrative  . None    Past Surgical History  Procedure Laterality Date  . Thyroidectomy    . Total abdominal hysterectomy w/ bilateral salpingoophorectomy  1968  . Colon resection  1977  . Cervical laminectomy  1995  . Cataract extraction      bilateral  . Breast enhancement surgery      silicone  . Breast surgery    . Posterior cervical fusion/foraminotomy N/A 03/07/2013    Procedure: Cervical four -seven POSTERIOR CERVICAL FUSION;  Surgeon: Mariam Dollar, MD;  Location: [facility];  Service: Neurosurgery;  Laterality: N/A;   Past Medical History  Diagnosis Date  . Osteoarthritis   . Hyperlipidemia   . GERD (gastroesophageal reflux disease)   . Peripheral neuropathy   . Osteopenia   . DDD (degenerative disc disease)   . Shingles   . Colon cancer 1977  . Thyroid cancer 1966  . Peripheral vascular disease   . Deep vein blood clot of left lower extremity 05/06/13   BP 123/56  Pulse 72  Resp 16  Ht 5\' 5"  (1.651 m)  Wt 160 lb (72.576 kg)  BMI 26.63 kg/m2  SpO2 99%     Review of Systems  Musculoskeletal: Positive for gait problem.  Neurological: Positive for numbness.       Objective:   Physical Exam General: Alert and oriented  x 3, No apparent distress. She is wearing her collar which is too loose.  HEENT: Head is normocephalic, atraumatic, PERRLA, EOMI, sclera anicteric, oral mucosa pink and moist, dentition intact, ext ear canals clear,  Neck: Supple without JVD or lymphadenopathy  Heart: Reg rate and rhythm. No murmurs rubs or gallops  Chest: CTA bilaterally without wheezes, rales, or rhonchi; no distress  Abdomen: Soft, non-tender, non-distended, bowel sounds positive.  Extremities: No clubbing, cyanosis, 1+ edema is noted throughout left lower extremity. Limb is tender with palpation along the left calf and anterior thigh. homan's test equivocal to positive. Leg is not warm. . Pulses 2+ Skin:sore on right heel stage 2 at least with odor and a good amount of fibronecrotic debris. It  measures about 1.25cm in diameter and about a cm deep. Area is quite tender to touch..  Neuro: Pt is cognitively appropriate with improved insight, memory, and awareness. Cranial nerves 2-12 are intact. No nystagmus. Sensory exam is decreased slightly left more than right. Reflexes are 1+ in uppers, 2+ lowers. . Fine motor coordination is intact. No tremors. Motor function is 4/5 in UE prox to distal. She has decreased FMC in both hands with contractures in palm and fingers noted bilaterally. underyling strength is 3+/5. RLE is 4/5 HF, KE and ankle 4+, LLE is 4-prox to 4/5 distally..  Musculoskeletal: low back tender. l. Denied substantial pain with basic AROM and PROM of the left leg.  Psych: Pt's affect is appropriate. Pt is cooperative  Assessment & Plan:   1. Functional deficits secondary to C6-7 fx's with C6 SCI ASIA C, mild TBI  2. LLE swelling, concerning for a DVT. She has low back pain but it doesn't seem to be necessarily associated with her leg sx  3. Mild reactive mood decrease  4. Dupuytren's contractures of hands, with additional contracture related to her SCI  5. Right heel wound stage 2+  Plan:  1. Santyl and moist dressing to wound. Wrap with dry dressing and kerlix daily -doxycycline 100mg  bid -xrays of right heel to rule out osteomyelitis -continued elevation of foot -gave the daughter extensive wound care instructions  -consider surgical consult based on progress. Hopefully, because she's tender, it's a good sign that there is viable tissue which will heal.  2  Xarelto to continue  for a period of 6 months.  3. Follow up with me in 2 months. .30 minutes of face to face patient care time were spent during this visit. All questions were encouraged and answered. I would imagine that the pt/family could transition home soon especially if her balance and stamina further improve.

## 2013-08-07 NOTE — Patient Instructions (Signed)
WOUND CARE  GENTLY SCRUB THE RIGHT HEEL WOUND WITH GAUZE.  APPLY SANTYL OINTMENT TO WOUND WITH Q-TIP  PACK WITH SMALL PIECE OF MOISTENED (WITH SALINE) GAUZE  COVER WITH DRY 4X4 PIECE OF GAUZE  WRAP WITH KERLIX   CHANGE DAILY   CALL ME WITH ANY PROBLEMS OR QUESTIONS (#[phone]).  HAVE A GOOD DAY

## 2013-08-12 ENCOUNTER — Telehealth: Payer: Self-pay

## 2013-08-12 NOTE — Telephone Encounter (Signed)
If she can't afford the cream, then use a wet to dry dressing only.  TAKE SMALL PIECE OF GAUZE AS I SHOWED HER AND MOISTEN WITH NORMAL SALINE AND PACK THE WOUND. COVER WITH DRY DRESSING AND THE OTHER WRAP AS SHE'S DOING---CHANGE DAILY. PRIOR TO APPLYING THE DRESSING THE NURSE OR DAUGHTER SHOULD TRY TO GENTLY SCRUB THE WOUND TO REMOVE ANY LOOSE NECROTIC TISSUE THAT IS OVER THE WOUND. THE GOAL IS TO EXPOSE THE PINK, HEALTHY GRANULATION BED WHICH WILL ALLOW THE WOUND TO CLOSE.

## 2013-08-12 NOTE — Telephone Encounter (Signed)
Patient granddaughter aware of xray results.  She went to pick up medication (cream) but it was $98.  Is there something else she can get that may be cheaper?  Please contact patients grandaughter Cassandra Walker with outcome.

## 2013-08-12 NOTE — Telephone Encounter (Signed)
Notified Ms Cassandra Walker's granddaughter.  They have been doing this and will continue.

## 2013-08-12 NOTE — Telephone Encounter (Signed)
Patients granddaughter called regarding xray results.  Left message for her to call back.

## 2013-08-17 ENCOUNTER — Encounter: Payer: Self-pay | Admitting: Physical Medicine & Rehabilitation

## 2013-08-17 ENCOUNTER — Encounter (HOSPITAL_BASED_OUTPATIENT_CLINIC_OR_DEPARTMENT_OTHER): Payer: Medicare Other | Admitting: Physical Medicine & Rehabilitation

## 2013-08-17 VITALS — BP 139/75 | HR 75 | Resp 14 | Ht 65.5 in | Wt 143.2 lb

## 2013-08-17 DIAGNOSIS — S12600S Unspecified displaced fracture of seventh cervical vertebra, sequela: Secondary | ICD-10-CM

## 2013-08-17 DIAGNOSIS — IMO0002 Reserved for concepts with insufficient information to code with codable children: Secondary | ICD-10-CM

## 2013-08-17 DIAGNOSIS — M545 Low back pain: Secondary | ICD-10-CM

## 2013-08-17 DIAGNOSIS — S91301A Unspecified open wound, right foot, initial encounter: Secondary | ICD-10-CM

## 2013-08-17 DIAGNOSIS — S12500S Unspecified displaced fracture of sixth cervical vertebra, sequela: Secondary | ICD-10-CM

## 2013-08-17 DIAGNOSIS — S91309A Unspecified open wound, unspecified foot, initial encounter: Secondary | ICD-10-CM

## 2013-08-17 NOTE — Progress Notes (Signed)
Subjective:    Patient ID: [id number] DEEM, female    DOB: 1932/03/28, 79 y.o.   MRN: [id number]  HPI  Cassandra Walker is back regarding her right heel wound. They didn't use the santyl as the rx was too expensive. i advised a wet to dry dressing which she used a few days until the home RN changed to a calcium alginate dressing. She is still having pain in the heel. xrays were negative for osteomyelitis. She is not having a fever, drainage, swelling. She is keeping the heel elevated. She is not doing much in the way of exercise.    Pain Inventory Average Pain 4 Pain Right Now 8 My pain is intermittent, sharp and aching  In the last 24 hours, has pain interfered with the following? General activity 2 Relation with others 0 Enjoyment of life 7 What TIME of day is your pain at its worst? morning Sleep (in general) Good  Pain is worse with: bending and standing Pain improves with: rest and medication Relief from Meds: 5  Mobility walk with assistance use a walker ability to climb steps?  yes do you drive?  no use a wheelchair needs help with transfers  Function retired I need assistance with the following:  dressing, bathing, toileting, meal prep, household duties and shopping  Neuro/Psych No problems in this area  Prior Studies Any changes since last visit?  no  Physicians involved in your care Any changes since last visit?  no   Family History  Problem Relation Age of Onset  . Lung cancer Father    History   Social History  . Marital Status: Widowed    Spouse Name: N/A    Number of Children: 4  . Years of Education: N/A   Occupational History  . retired from [employer]    Social History Main Topics  . Smoking status: Former Smoker -- 1.00 packs/day for 20 years    Quit date: 11/05/1972  . Smokeless tobacco: Never Used  . Alcohol Use: None  . Drug Use: None  . Sexual Activity: None   Other Topics Concern  . None   Social History Narrative  . None   Past  Surgical History  Procedure Laterality Date  . Thyroidectomy    . Total abdominal hysterectomy w/ bilateral salpingoophorectomy  1968  . Colon resection  1977  . Cervical laminectomy  1995  . Cataract extraction      bilateral  . Breast enhancement surgery      silicone  . Breast surgery    . Posterior cervical fusion/foraminotomy N/A 03/07/2013    Procedure: Cervical four -seven POSTERIOR CERVICAL FUSION;  Surgeon: Mariam Dollar, MD;  Location: [facility];  Service: Neurosurgery;  Laterality: N/A;   Past Medical History  Diagnosis Date  . Osteoarthritis   . Hyperlipidemia   . GERD (gastroesophageal reflux disease)   . Peripheral neuropathy   . Osteopenia   . DDD (degenerative disc disease)   . Shingles   . Colon cancer 1977  . Thyroid cancer 1966  . Peripheral vascular disease   . Deep vein blood clot of left lower extremity 05/06/13   BP 139/75  Pulse 75  Resp 14  Ht 5' 5.5" (1.664 m)  Wt 143 lb 3.2 oz (64.955 kg)  BMI 23.46 kg/m2  SpO2 99%     Review of Systems  Skin: Positive for wound.  All other systems reviewed and are negative.       Objective:   Physical  Exam General: Alert and oriented x 3, No apparent distress. She is wearing her collar which is too loose.  HEENT: Head is normocephalic, atraumatic, PERRLA, EOMI, sclera anicteric, oral mucosa pink and moist, dentition intact, ext ear canals clear,  Neck: Supple without JVD or lymphadenopathy  Heart: Reg rate and rhythm. No murmurs rubs or gallops  Chest: CTA bilaterally without wheezes, rales, or rhonchi; no distress  Abdomen: Soft, non-tender, non-distended, bowel sounds positive.  Extremities: No clubbing, cyanosis, 1+ edema is noted throughout left lower extremity. Limb is tender with palpation along the left calf and anterior thigh. homan's test equivocal to positive. Leg is not warm. . Pulses 2+  Skin: sore is smaller, 0.75 cm in diameter and 0.5cm in depth. There is minimal to no odor. She is still  tender to palpation and when I gently scrubbed the area with gauze. After i scrubbed the wound was essentially pink with granulation tissue present. The area around the wound was dry with minimal to no erythema.  Neuro: Pt is cognitively appropriate with improved insight, memory, and awareness. Cranial nerves 2-12 are intact. No nystagmus. Sensory exam is decreased slightly left more than right. Reflexes are 1+ in uppers, 2+ lowers. . Fine motor coordination is intact. No tremors. Motor function is 4/5 in UE prox to distal. She has decreased FMC in both hands with contractures in palm and fingers noted bilaterally. underyling strength is 3+/5. RLE is 4/5 HF, KE and ankle 4+, LLE is 4-prox to 4/5 distally..  Musculoskeletal: low back tender. l. Denied substantial pain with basic AROM and PROM of the left leg.  Psych: Pt's affect is appropriate. Pt is cooperative  Assessment & Plan:   1. Functional deficits secondary to C6-7 fx's with C6 SCI ASIA C, mild TBI  2. LLE swelling, concerning for a DVT. She has low back pain but it doesn't seem to be necessarily associated with her leg sx  3. Mild reactive mood decrease  4. Dupuytren's contractures of hands, with additional contracture related to her SCI  5. Right heel wound stage 2+ --it appears to be improving.   Plan:  1. Wound appears to be healing depsite not completely following my recs - I still, however, will make a referral to the wound care clinic, given her ongoing pain in this area.  -continue elevation of the heel. -since there is more granulation tissue now, I am ok with the calcium alginate dressing as long as the area is scrubbed clean prior to applicatoin..  2 Xarelto to continue for a period of 6 months.  3. Follow up with me in 2 months. .15 minutes of face to face patient care time were spent during this visit. All questions were encouraged and answered. Discussed pilates exercises and principes for her low back.           Assessment & Plan:

## 2013-08-17 NOTE — Patient Instructions (Signed)
YOU NEED TO BE STANDING FOR AT LEAST 30-60 MINUTES PER DAY. YOU NEED TO BE WALKING AT LEAST 30 MINUTES PER DAY.   WORK ON GOOD POSTURE AND WORK ON USING YOUR BACK MUSCLES  PRACTICE THE EXERCISES I PROVIDED YOU!!

## 2013-08-25 ENCOUNTER — Telehealth: Payer: Self-pay

## 2013-08-25 NOTE — Telephone Encounter (Signed)
Cassandra Walker from [facility] called to get a verbal okay to monitor patient's wound, diabetes, and hypertension for 6 more days.

## 2013-08-27 NOTE — Telephone Encounter (Signed)
Verbal given to Joni Reining to monitor patients wound.

## 2013-09-03 ENCOUNTER — Encounter (HOSPITAL_BASED_OUTPATIENT_CLINIC_OR_DEPARTMENT_OTHER): Payer: Medicare Other | Attending: Internal Medicine

## 2013-09-03 DIAGNOSIS — L89609 Pressure ulcer of unspecified heel, unspecified stage: Secondary | ICD-10-CM | POA: Insufficient documentation

## 2013-09-03 DIAGNOSIS — Z981 Arthrodesis status: Secondary | ICD-10-CM | POA: Insufficient documentation

## 2013-09-03 DIAGNOSIS — L8990 Pressure ulcer of unspecified site, unspecified stage: Secondary | ICD-10-CM | POA: Insufficient documentation

## 2013-09-03 DIAGNOSIS — Z86718 Personal history of other venous thrombosis and embolism: Secondary | ICD-10-CM | POA: Insufficient documentation

## 2013-09-03 DIAGNOSIS — Z853 Personal history of malignant neoplasm of breast: Secondary | ICD-10-CM | POA: Insufficient documentation

## 2013-09-03 DIAGNOSIS — I10 Essential (primary) hypertension: Secondary | ICD-10-CM | POA: Insufficient documentation

## 2013-09-03 DIAGNOSIS — E039 Hypothyroidism, unspecified: Secondary | ICD-10-CM | POA: Insufficient documentation

## 2013-09-03 DIAGNOSIS — Z79899 Other long term (current) drug therapy: Secondary | ICD-10-CM | POA: Insufficient documentation

## 2013-09-04 NOTE — Progress Notes (Signed)
[facility]  NAME:  Cassandra Walker, Cassandra Walker NO.:  [id number]  MEDICAL RECORD NO.:  [id number]      DATE OF BIRTH:  January 28, 1932  PHYSICIAN:  Maxwell Caul, M.D. VISIT DATE:  09/03/2013                                  OFFICE VISIT   HISTORY OF PRESENT ILLNESS:  Cassandra Walker is a 79 year old woman who suffered a traumatic C-spine injury requiring surgical fixation with a rod placement in May of this year.  In the post hospitalization face she spent at [facility].  The patient states she developed a pressure area at that time.  She has been followed by her rehab physician, Dr. Hermelinda Medicus and she has been packing this with moist gauze and bandaging it.  At some point, Santyl I think was suggested although the patient did not carry through with this due to costs.  Recently, she has been followed by homehealth at home.  They have been using some form of silver based dressing on this, however this has not been resolving and they are here for our review.  PAST MEDICAL HISTORY: 1. Hypertension. 2. Hypothyroidism. 3. Breast cancer. 4. Hysterectomy. 5. Partial colectomy. 6. History of DVTs. 7. Osteoarthritis.  PAST SURGICAL HISTORY:  Thyroidectomy in the 1960s.  Recent cervical spine fracture in May of this year and cataract surgery in 2012.  MEDICATION LIST:  Reviewed.  She is on gabapentin 200 three times a day, tramadol 50 three times a day,  lisinopril 20 daily, meclizine 25 three times a day p.r.n., Synthroid 75 mcg daily, atenolol 25 b.i.d., atorvastatin 10 one-half tablet daily, cyclobenzaprine 10 mg 2 times a day, Xarelto 20 mg daily.  PHYSICAL EXAMINATION:  VITAL SIGNS:  Temperature is 98.4, pulse 73, respirations 17, blood pressure is 137/72.  The wound is on the posterior right heel measures 1 x 0.5 x 0.4.  This was covered by an adherent fibrous eschar.  I did do manual debridement with a #15 scalpel blade to remove at least some of  this eschar, however, I am not able to fully remove this, and at this point  the wound is technically unstageable.  Vascular; unable to calculate her ABIs.  The forefoot is cool.  There is very poor capillary refill time.  I thought I could feel a faint posterior tibial pulse bilaterally, although I could not feel her popliteal pulses on either side.  There is likely combined arterial and venous insufficiency.  IMPRESSION: 1. Pressure ulcer, right heel.  Although this is small it is deep and     I could not debride this down to a healthy base.  We spoke to the     patient about using a Santyl base dressing.  We covered this in a     foam heel cup and a Kerlix net wrap.  This will be changed by a     medicist every other day and we will review this in a week's time. 2. Possible peripheral arterial disease.  I am going to go ahead and     do arterial Dopplers and ABIs on this patient.  There was really a     suggestion at the bedside that this woman has significant     peripheral vascular disease, which will hinder healing  this wound.  She will be seen in a week's time.          ______________________________ Maxwell Caul, M.D.     MGR/MEDQ  D:  09/03/2013  T:  09/04/2013  Job:  [id number]

## 2013-09-10 ENCOUNTER — Ambulatory Visit (HOSPITAL_COMMUNITY)
Admission: RE | Admit: 2013-09-10 | Discharge: 2013-09-10 | Disposition: A | Discharge status: Home / Self Care | Payer: Medicare Other | Source: Ambulatory Visit | Attending: Vascular Surgery | Admitting: Vascular Surgery

## 2013-09-10 ENCOUNTER — Other Ambulatory Visit (HOSPITAL_BASED_OUTPATIENT_CLINIC_OR_DEPARTMENT_OTHER): Payer: Self-pay | Admitting: Internal Medicine

## 2013-09-10 ENCOUNTER — Encounter (HOSPITAL_BASED_OUTPATIENT_CLINIC_OR_DEPARTMENT_OTHER): Payer: Medicare Other | Attending: Internal Medicine

## 2013-09-10 DIAGNOSIS — L8992 Pressure ulcer of unspecified site, stage 2: Secondary | ICD-10-CM | POA: Insufficient documentation

## 2013-09-10 DIAGNOSIS — L97409 Non-pressure chronic ulcer of unspecified heel and midfoot with unspecified severity: Secondary | ICD-10-CM | POA: Insufficient documentation

## 2013-09-10 DIAGNOSIS — L97419 Non-pressure chronic ulcer of right heel and midfoot with unspecified severity: Secondary | ICD-10-CM

## 2013-09-10 DIAGNOSIS — L89609 Pressure ulcer of unspecified heel, unspecified stage: Secondary | ICD-10-CM | POA: Insufficient documentation

## 2013-10-05 ENCOUNTER — Ambulatory Visit (INDEPENDENT_AMBULATORY_CARE_PROVIDER_SITE_OTHER): Payer: Medicare Other | Admitting: Internal Medicine

## 2013-10-05 ENCOUNTER — Encounter: Payer: Self-pay | Admitting: Internal Medicine

## 2013-10-05 VITALS — BP 152/88 | HR 76 | Temp 97.7°F | Resp 18 | Wt 143.2 lb

## 2013-10-05 DIAGNOSIS — Z79899 Other long term (current) drug therapy: Secondary | ICD-10-CM

## 2013-10-05 DIAGNOSIS — E559 Vitamin D deficiency, unspecified: Secondary | ICD-10-CM

## 2013-10-05 DIAGNOSIS — R7309 Other abnormal glucose: Secondary | ICD-10-CM

## 2013-10-05 DIAGNOSIS — E782 Mixed hyperlipidemia: Secondary | ICD-10-CM

## 2013-10-05 DIAGNOSIS — I10 Essential (primary) hypertension: Secondary | ICD-10-CM

## 2013-10-05 DIAGNOSIS — H6123 Impacted cerumen, bilateral: Secondary | ICD-10-CM

## 2013-10-05 DIAGNOSIS — H6120 Impacted cerumen, unspecified ear: Secondary | ICD-10-CM

## 2013-10-05 LAB — CBC WITH DIFFERENTIAL/PLATELET
Basophils Absolute: 0 10*3/uL (ref 0.0–0.1)
Eosinophils Relative: 17 % — ABNORMAL HIGH (ref 0–5)
HCT: 36.7 % (ref 36.0–46.0)
Hemoglobin: 12 g/dL (ref 12.0–15.0)
Lymphocytes Relative: 24 % (ref 12–46)
MCV: 81.2 fL (ref 78.0–100.0)
Monocytes Absolute: 0.7 10*3/uL (ref 0.1–1.0)
Monocytes Relative: 8 % (ref 3–12)
Neutro Abs: 5 10*3/uL (ref 1.7–7.7)
Platelets: 330 10*3/uL (ref 150–400)
RBC: 4.52 MIL/uL (ref 3.87–5.11)
RDW: 16.4 % — ABNORMAL HIGH (ref 11.5–15.5)
WBC: 9.9 10*3/uL (ref 4.0–10.5)

## 2013-10-05 NOTE — Patient Instructions (Signed)
Continue diet & medications same as discussed.   Further disposition pending lab results.    Taper Gabapentin to 2 x daily for 2 days then change to 1 tablet at bedtime only  May take up to 6 Tramadol daily as needed for pain   Hypertension As your heart beats, it forces blood through your arteries. This force is your blood pressure. If the pressure is too high, it is called hypertension (HTN) or high blood pressure. HTN is dangerous because you may have it and not know it. High blood pressure may mean that your heart has to work harder to pump blood. Your arteries may be narrow or stiff. The extra work puts you at risk for heart disease, stroke, and other problems.  Blood pressure consists of two numbers, a higher number over a lower, 110/72, for example. It is stated as "110 over 72." The ideal is below 120 for the top number (systolic) and under 80 for the bottom (diastolic). Write down your blood pressure today. You should pay close attention to your blood pressure if you have certain conditions such as:  Heart failure.  Prior heart attack.  Diabetes  Chronic kidney disease.  Prior stroke.  Multiple risk factors for heart disease. To see if you have HTN, your blood pressure should be measured while you are seated with your arm held at the level of the heart. It should be measured at least twice. A one-time elevated blood pressure reading (especially in the Emergency Department) does not mean that you need treatment. There may be conditions in which the blood pressure is different between your right and left arms. It is important to see your caregiver soon for a recheck. Most people have essential hypertension which means that there is not a specific cause. This type of high blood pressure may be lowered by changing lifestyle factors such as:  Stress.  Smoking.  Lack of exercise.  Excessive weight.  Drug/tobacco/alcohol use.  Eating less salt. Most people do not have  symptoms from high blood pressure until it has caused damage to the body. Effective treatment can often prevent, delay or reduce that damage. TREATMENT  When a cause has been identified, treatment for high blood pressure is directed at the cause. There are a large number of medications to treat HTN. These fall into several categories, and your caregiver will help you select the medicines that are best for you. Medications may have side effects. You should review side effects with your caregiver. If your blood pressure stays high after you have made lifestyle changes or started on medicines,   Your medication(s) may need to be changed.  Other problems may need to be addressed.  Be certain you understand your prescriptions, and know how and when to take your medicine.  Be sure to follow up with your caregiver within the time frame advised (usually within two weeks) to have your blood pressure rechecked and to review your medications.  If you are taking more than one medicine to lower your blood pressure, make sure you know how and at what times they should be taken. Taking two medicines at the same time can result in blood pressure that is too low. SEEK IMMEDIATE MEDICAL CARE IF:  You develop a severe headache, blurred or changing vision, or confusion.  You have unusual weakness or numbness, or a faint feeling.  You have severe chest or abdominal pain, vomiting, or breathing problems. MAKE SURE YOU:   Understand these instructions.  Will watch  your condition.  Will get help right away if you are not doing well or get worse. Document Released: 10/22/2005 Document Revised: 01/14/2012 Document Reviewed: 06/11/2008 [facility] Patient Information 2014 [place], [place].    Insulin Resistance Blood sugar (glucose) levels are controlled by a hormone called insulin. Insulin is made by your pancreas. When your blood glucose goes up, insulin is released into your blood. Insulin is required for your  body to function normally. However, your body can become resistant to your own insulin or to insulin given to treat diabetes. In either case, insulin resistance can lead to serious problems. These problems include:  Type 2 diabetes.  Heart disease.  High blood pressure.  Stroke.  Polycystic ovary syndrome.  Fatty liver. CAUSES  Insulin resistance can develop for many different reasons. It is more likely to happen in people with these conditions or characteristics:  Obesity.  Inactivity.  Pregnancy.  High blood pressure.  Stress.  Steroid use.  Infection or severe illness.  Increased levels of cholesterol and triglycerides. SYMPTOMS  There are no symptoms. You may have symptoms related to the various complications of insulin resistance.  DIAGNOSIS  Several different things can make your caregiver suspect you have insulin resistance. These include:  High blood glucose (hyperglycemia).  Abnormal cholesterol levels.  High uric acid levels.  Changes related to blood pressure.  Changes related to inflammation. Insulin resistance can be determined with blood tests. An elevated insulin level when you have not eaten might suggest resistance. Other more complicated tests are sometimes necessary. TREATMENT  Lifestyle changes are the most important treatment for insulin resistance.   If you are overweight and you have insulin resistance, you can improve your insulin sensitivity by losing weight.  Moderate exercise for 30 40 minutes, 4 days a week, can improve insulin sensitivity. Some medicines can also help improve your insulin sensitivity. Your caregiver can discuss these with you if they are appropriate.  HOME CARE INSTRUCTIONS   Do not smoke.  Keep your weight at a healthy level.  Get exercise.  If you have diabetes, follow your caregiver's directions.  If you have high blood pressure, follow your caregiver's directions.  Only take prescription medicines for  pain, fever, or discomfort as directed by your caregiver. SEEK MEDICAL CARE IF:   You are diabetic and you are having problems keeping your blood glucose levels at target range.  You are having episodes of low blood glucose (hypoglycemia).  You feel you might be having side effects from your medicines.  You have symptoms of an illness that is not improving after 3 4 days.  You have a sore or wound that is not healing.  You notice a change in vision or a new problem with your vision. SEEK IMMEDIATE MEDICAL CARE IF:   Your blood glucose goes below 70, especially if you have confusion, lightheadedness, or other symptoms with it.  Your blood glucose is very high (as advised by your caregiver) twice in a row.  You pass out.  You have chest pain or trouble breathing.  You have a sudden, severe headache.  You have sudden weakness in one arm or one leg.  You have sudden difficulty speaking or swallowing.  You develop vomiting or diarrhea that is getting worse or not improving after 1 day. Document Released: 12/11/2005 Document Revised: 04/22/2012 Document Reviewed: 04/02/2013 [facility] Patient Information 2014 [place], [place].   Cholesterol Cholesterol is a white, waxy, fat-like protein needed by your body in small amounts. The liver makes all  the cholesterol you need. It is carried from the liver by the blood through the blood vessels. Deposits (plaque) may build up on blood vessel walls. This makes the arteries narrower and stiffer. Plaque increases the risk for heart attack and stroke. You cannot feel your cholesterol level even if it is very high. The only way to know is by a blood test to check your lipid (fats) levels. Once you know your cholesterol levels, you should keep a record of the test results. Work with your caregiver to to keep your levels in the desired range. WHAT THE RESULTS MEAN:  Total cholesterol is a rough measure of all the cholesterol in your blood.  LDL is  the so-called bad cholesterol. This is the type that deposits cholesterol in the walls of the arteries. You want this level to be low.  HDL is the good cholesterol because it cleans the arteries and carries the LDL away. You want this level to be high.  Triglycerides are fat that the body can either burn for energy or store. High levels are closely linked to heart disease. DESIRED LEVELS:  Total cholesterol below 200.  LDL below 100 for people at risk, below 70 for very high risk.  HDL above 50 is good, above 60 is best.  Triglycerides below 150. HOW TO LOWER YOUR CHOLESTEROL:  Diet.  Choose fish or white meat chicken and [place], roasted or baked. Limit fatty cuts of red meat, fried foods, and processed meats, such as sausage and lunch meat.  Eat lots of fresh fruits and vegetables. Choose whole grains, beans, pasta, potatoes and cereals.  Use only small amounts of olive, corn or canola oils. Avoid butter, mayonnaise, shortening or palm kernel oils. Avoid foods with trans-fats.  Use skim/nonfat milk and low-fat/nonfat yogurt and cheeses. Avoid whole milk, cream, ice cream, egg yolks and cheeses. Healthy desserts include angel food cake, ginger snaps, animal crackers, hard candy, popsicles, and low-fat/nonfat frozen yogurt. Avoid pastries, cakes, pies and cookies.  Exercise.  A regular program helps decrease LDL and raises HDL.  Helps with weight control.  Do things that increase your activity level like gardening, walking, or taking the stairs.  Medication.  May be prescribed by your caregiver to help lowering cholesterol and the risk for heart disease.  You may need medicine even if your levels are normal if you have several risk factors. HOME CARE INSTRUCTIONS   Follow your diet and exercise programs as suggested by your caregiver.  Take medications as directed.  Have blood work done when your caregiver feels it is necessary. MAKE SURE YOU:   Understand these  instructions.  Will watch your condition.  Will get help right away if you are not doing well or get worse. Document Released: 07/17/2001 Document Revised: 01/14/2012 Document Reviewed: 01/07/2008 [facility] Patient Information 2014 [place], [place]. Vitamin D Deficiency Vitamin D is an important vitamin that your body needs. Having too little of it in your body is called a deficiency. A very bad deficiency can make your bones soft and can cause a condition called rickets.  Vitamin D is important to your body for different reasons, such as:   It helps your body absorb 2 minerals called calcium and phosphorus.  It helps make your bones healthy.  It may prevent some diseases, such as diabetes and multiple sclerosis.  It helps your muscles and heart. You can get vitamin D in several ways. It is a natural part of some foods. The vitamin is also added  to some dairy products and cereals. Some people take vitamin D supplements. Also, your body makes vitamin D when you are in the sun. It changes the sun's rays into a form of the vitamin that your body can use. CAUSES   Not eating enough foods that contain vitamin D.  Not getting enough sunlight.  Having certain digestive system diseases that make it hard to absorb vitamin D. These diseases include Crohn's disease, chronic pancreatitis, and cystic fibrosis.  Having a surgery in which part of the stomach or small intestine is removed.  Being obese. Fat cells pull vitamin D out of your blood. That means that obese people may not have enough vitamin D left in their blood and in other body tissues.  Having chronic kidney or liver disease. RISK FACTORS Risk factors are things that make you more likely to develop a vitamin D deficiency. They include:  Being older.  Not being able to get outside very much.  Living in a nursing home.  Having had broken bones.  Having weak or thin bones (osteoporosis).  Having a disease or condition that  changes how your body absorbs vitamin D.  Having dark skin.  Some medicines such as seizure medicines or steroids.  Being overweight or obese. SYMPTOMS Mild cases of vitamin D deficiency may not have any symptoms. If you have a very bad case, symptoms may include:  Bone pain.  Muscle pain.  Falling often.  Broken bones caused by a minor injury, due to osteoporosis. DIAGNOSIS A blood test is the best way to tell if you have a vitamin D deficiency. TREATMENT Vitamin D deficiency can be treated in different ways. Treatment for vitamin D deficiency depends on what is causing it. Options include:  Taking vitamin D supplements.  Taking a calcium supplement. Your caregiver will suggest what dose is best for you. HOME CARE INSTRUCTIONS  Take any supplements that your caregiver prescribes. Follow the directions carefully. Take only the suggested amount.  Have your blood tested 2 months after you start taking supplements.  Eat foods that contain vitamin D. Healthy choices include:  Fortified dairy products, cereals, or juices. Fortified means vitamin D has been added to the food. Check the label on the package to be sure.  Fatty fish like salmon or trout.  Eggs.  Oysters.  Do not use a tanning bed.  Keep your weight at a healthy level. Lose weight if you need to.  Keep all follow-up appointments. Your caregiver will need to perform blood tests to make sure your vitamin D deficiency is going away. SEEK MEDICAL CARE IF:  You have any questions about your treatment.  You continue to have symptoms of vitamin D deficiency.  You have nausea or vomiting.  You are constipated.  You feel confused.  You have severe abdominal or back pain. MAKE SURE YOU:  Understand these instructions.  Will watch your condition.  Will get help right away if you are not doing well or get worse. Document Released: 01/14/2012 Document Revised: 02/16/2013 Document Reviewed:  01/14/2012 [facility] Patient Information 2014 [place], [place].

## 2013-10-05 NOTE — Progress Notes (Signed)
Patient ID: Cassandra Walker, female   DOB: Nov 10, 1931, 79 y.o.   MRN: [id number]   This very nice 79 yo WWF presents for  follow up with Hypertension, Hyperlipidemia, pre-diabetes and Vitamin D deficiency.    BP has been controlled at home. Today's BP is sl elevated at 152/88 rechecked at 138/84. Patient denies any cardiac type chest pain, palpitations, dyspnea/orthopnea/PND, dizziness, claudication, or dependent edema.   Hyperlipidemia is controlled with diet & meds. Last cholesterol was 160 , HDL 50, and LDL 79 in August at goal . Patient denies myalgias or other med SE's.    Also, the patient has history of prediabetes/insulin resistance with last A1c of 6.0% in August. Patient denies any symptoms of reactive hypoglycemia, diabetic polys, paresthesias or visual blurring.   Patient's granddaughter, Cassandra Walker is present and concerned about patient's cognitive decline over the last sever months. Patient was hospitalized in May with a cervical Fx for about 2 months including hospital and rehab stays. Then she was at [facility] about 2 months. Pt's GD has begun tapering pt's gabapentin from 200-> 100 mg TID suspecting contributory to pt's cognitive decline.   Further, Patient has history of vitamin D deficiency with last vitamin D of 60. Patient supplements vitamin D without any suspected side-effects.  Current Outpatient Prescriptions on File Prior to Visit  Medication Sig Dispense Refill  . atenolol (TENORMIN) 25 MG tablet Take 1 tablet (25 mg total) by mouth 2 (two) times daily.      Marland Kitchen atorvastatin (LIPITOR) 10 MG tablet Take 5 mg by mouth every evening.       . Cholecalciferol (VITAMIN D3 HIGH POTENCY PO) Take 8,000 Units by mouth daily.      . fish oil-omega-3 fatty acids 1000 MG capsule Take 2 g by mouth daily.      Marland Kitchen levothyroxine (SYNTHROID, LEVOTHROID) 75 MCG tablet Take 75 mcg by mouth daily.        Marland Kitchen lisinopril (PRINIVIL,ZESTRIL) 20 MG tablet Take 1 tablet (20 mg total) by mouth daily.       . meclizine (ANTIVERT) 25 MG tablet Take 1 tablet (25 mg total) by mouth 3 (three) times daily as needed for dizziness or nausea.  30 tablet  0  . Rivaroxaban (XARELTO) 20 MG TABS tablet Take 1 tablet (20 mg total) by mouth daily.  30 tablet  3  . traMADol (ULTRAM) 50 MG tablet Take 50 mg by mouth every 8 (eight) hours.      . ondansetron (ZOFRAN) 4 MG tablet Take 4 mg by mouth every 8 (eight) hours as needed for nausea.       No current facility-administered medications on file prior to visit.     Allergies  Allergen Reactions  . Codeine Other (See Comments)    Jittery, nervous & hallucinations    PMHx:   Past Medical History  Diagnosis Date  . Osteoarthritis   . Hyperlipidemia   . GERD (gastroesophageal reflux disease)   . Peripheral neuropathy   . Osteopenia   . DDD (degenerative disc disease)   . Shingles   . Colon cancer 1977  . Thyroid cancer 1966  . Peripheral vascular disease   . Deep vein blood clot of left lower extremity 05/06/13    FHx:    Reviewed / unchanged  SHx:    Reviewed / unchanged  Systems Review: Constitutional: Denies fever, chills, wt changes, headaches, insomnia, fatigue, night sweats, change in appetite. Eyes: Denies redness, blurred vision, diplopia, discharge, itchy, watery  eyes.  ENT: Denies discharge, congestion, post nasal drip, epistaxis, sore throat, earache, hearing loss, dental pain, tinnitus, vertigo, sinus pain, snoring.  CV: Denies chest pain, palpitations, irregular heartbeat, syncope, dyspnea, diaphoresis, orthopnea, PND, claudication, edema. Respiratory: denies cough, dyspnea, DOE, pleurisy, hoarseness, laryngitis, wheezing.  Gastrointestinal: Denies dysphagia, odynophagia, heartburn, reflux, water brash, abdominal pain or cramps, nausea, vomiting, bloating, diarrhea, constipation, hematemesis, melena, hematochezia,  Hemorrhoids. Genitourinary: Denies dysuria, frequency, urgency, nocturia, hesitancy, discharge, hematuria, flank  pain. Musculoskeletal: Denies arthralgias, myalgias, stiffness, jt. Swelling, limp, strain/sprain. C/o LBP w/o sciatica Skin: Denies pruritus, rash, hives, warts, acne, eczema, change in skin lesion(s). Neuro: No weakness, tremor, incoordination, spasms, paresthesia, or pain. Psychiatric: Denies confusion, memory loss, or sensory loss. Endo: Denies change in weight, skin, hair change.  Heme/Lymph: No excessive bleeding, bruising, orenlarged lymph nodes.  Filed Vitals:   10/05/13 1540  BP: 152/88  Pulse: 76  Temp: 97.7 F (36.5 C)  Resp: 18    Estimated body mass index is 23.46 kg/(m^2) as calculated from the following:   Height as of 08/17/13: 5' 5.5" (1.664 m).   Weight as of this encounter: 143 lb 3.2 oz (64.955 kg).  On Exam: Appears well nourished - in no distress. Eyes: PERRLA, EOMs, conjunctiva no swelling or erythema. Sinuses: No frontal/maxillary tenderness ENT/Mouth: EAC's clear, TM's nl w/o erythema, bulging. Nares clear w/o erythema, swelling, exudates. Oropharynx clear without erythema or exudates. Oral hygiene is good. Tongue normal, non obstructing. Hearing intact.  Neck: Supple. Thyroid nl. Car 2+/2+ without bruits, nodes or JVD. Chest: Respirations nl with BS clear & equal w/o rales, rhonchi, wheezing or stridor.  Cor: Heart sounds normal w/ regular rate and rhythm without sig. murmurs, gallops, clicks, or rubs. Peripheral pulses normal and equal  without edema.  Abdomen: Soft & bowel sounds normal. Non-tender w/o guarding, rebound, hernias, masses, or organomegaly.  Lymphatics: Unremarkable.  Musculoskeletal: Full ROM all peripheral extremities, joint stability, 5/5 strength, and normal gait.  Skin: Warm, dry without exposed rashes, lesions, ecchymosis apparent.  Neuro: Cranial nerves intact, reflexes equal bilaterally. Sensory-motor testing grossly intact. Tendon reflexes grossly intact. No focal neuro defects. MMSE was administered with a score of 23.5 in  borderline impaired range. Pysch: Alert & oriented x 3. Insight and judgement nl & appropriate. No ideations.  Assessment and Plan:  1. Hypertension - Continue monitor blood pressure at home. Continue diet/meds same.  2. Hyperlipidemia - Continue diet/meds, exercise,& lifestyle modifications. Continue monitor periodic cholesterol/liver & renal functions   3. Pre-diabetes/Insulin Resistance - Continue diet, exercise, lifestyle modifications. Monitor appropriate labs.  4. Cognitive Decline - suspect secondary to gabapentin and advised  taper dose to 100 mg hs only.  4. Vitamin D Deficiency - Continue supplementation.  Further disposition pending results of labs.

## 2013-10-06 LAB — BASIC METABOLIC PANEL WITH GFR
BUN: 13 mg/dL (ref 6–23)
CO2: 31 mEq/L (ref 19–32)
Chloride: 106 mEq/L (ref 96–112)
Creat: 0.86 mg/dL (ref 0.50–1.10)
GFR, Est Non African American: 64 mL/min
Glucose, Bld: 81 mg/dL (ref 70–99)
Potassium: 4.1 mEq/L (ref 3.5–5.3)

## 2013-10-06 LAB — HEPATIC FUNCTION PANEL
ALT: 14 U/L (ref 0–35)
AST: 14 U/L (ref 0–37)
Albumin: 4 g/dL (ref 3.5–5.2)
Alkaline Phosphatase: 74 U/L (ref 39–117)
Indirect Bilirubin: 0.3 mg/dL (ref 0.0–0.9)
Total Protein: 7 g/dL (ref 6.0–8.3)

## 2013-10-06 LAB — TSH: TSH: 3.54 u[IU]/mL (ref 0.350–4.500)

## 2013-10-06 LAB — LIPID PANEL
LDL Cholesterol: 75 mg/dL (ref 0–99)
Triglycerides: 127 mg/dL (ref <150)
VLDL: 25 mg/dL (ref 0–40)

## 2013-10-06 LAB — HEMOGLOBIN A1C: Mean Plasma Glucose: 131 mg/dL — ABNORMAL HIGH (ref <117)

## 2013-10-06 LAB — INSULIN, FASTING: Insulin fasting, serum: 7 u[IU]/mL (ref 3–28)

## 2013-10-08 ENCOUNTER — Encounter (HOSPITAL_BASED_OUTPATIENT_CLINIC_OR_DEPARTMENT_OTHER): Payer: Medicare Other | Attending: Internal Medicine

## 2013-10-08 DIAGNOSIS — L8992 Pressure ulcer of unspecified site, stage 2: Secondary | ICD-10-CM | POA: Insufficient documentation

## 2013-10-08 DIAGNOSIS — L89609 Pressure ulcer of unspecified heel, unspecified stage: Secondary | ICD-10-CM | POA: Insufficient documentation

## 2013-10-13 ENCOUNTER — Encounter: Payer: Medicare Other | Attending: Physical Medicine & Rehabilitation | Admitting: Physical Medicine & Rehabilitation

## 2013-10-13 ENCOUNTER — Encounter: Payer: Self-pay | Admitting: Physical Medicine & Rehabilitation

## 2013-10-13 VITALS — BP 164/88 | HR 78 | Resp 16 | Ht 65.5 in | Wt 145.0 lb

## 2013-10-13 DIAGNOSIS — I82402 Acute embolism and thrombosis of unspecified deep veins of left lower extremity: Secondary | ICD-10-CM

## 2013-10-13 DIAGNOSIS — I82409 Acute embolism and thrombosis of unspecified deep veins of unspecified lower extremity: Secondary | ICD-10-CM | POA: Insufficient documentation

## 2013-10-13 DIAGNOSIS — N390 Urinary tract infection, site not specified: Secondary | ICD-10-CM

## 2013-10-13 DIAGNOSIS — S12600S Unspecified displaced fracture of seventh cervical vertebra, sequela: Secondary | ICD-10-CM

## 2013-10-13 DIAGNOSIS — M545 Low back pain, unspecified: Secondary | ICD-10-CM | POA: Insufficient documentation

## 2013-10-13 DIAGNOSIS — R339 Retention of urine, unspecified: Secondary | ICD-10-CM | POA: Insufficient documentation

## 2013-10-13 DIAGNOSIS — IMO0002 Reserved for concepts with insufficient information to code with codable children: Secondary | ICD-10-CM | POA: Insufficient documentation

## 2013-10-13 DIAGNOSIS — S12500S Unspecified displaced fracture of sixth cervical vertebra, sequela: Secondary | ICD-10-CM

## 2013-10-13 DIAGNOSIS — X58XXXS Exposure to other specified factors, sequela: Secondary | ICD-10-CM | POA: Insufficient documentation

## 2013-10-13 DIAGNOSIS — M720 Palmar fascial fibromatosis [Dupuytren]: Secondary | ICD-10-CM | POA: Insufficient documentation

## 2013-10-13 DIAGNOSIS — G8250 Quadriplegia, unspecified: Secondary | ICD-10-CM | POA: Insufficient documentation

## 2013-10-13 NOTE — Patient Instructions (Signed)
CALL ME WITH ANY PROBLEMS OR QUESTIONS (#[phone]).    HAPPY HOLIDAYS!!!!!   

## 2013-10-13 NOTE — Progress Notes (Signed)
Subjective:    Patient ID: Cassandra Walker, female    DOB: Oct 17, 1932, 79 y.o.   MRN: [id number]  HPI  Mrs. Urbanski is back regarding her SCI. She has been gradually improving. She has been walking more and more. She can walk for about 10 minutes at a time without having to stop to rest. She walks with her walker but still has balance issues.   Her dupuytren's contactures continue to be quite problematic. They have worsened since her spinal cord injury.   She has complained of some low back pain. She also has had some urinary symptoms with odor and increased frequency. Her back tends to bother her the longer she's standing.   Pain Inventory Average Pain 2 Pain Right Now 1 My pain is intermittent and stabbing  In the last 24 hours, has pain interfered with the following? General activity 3 Relation with others 0 Enjoyment of life 3 What TIME of day is your pain at its worst? morning Sleep (in general) Good  Pain is worse with: inactivity Pain improves with: rest Relief from Meds: 2  Mobility use a walker how many minutes can you walk? 10 ability to climb steps?  yes do you drive?  no use a wheelchair needs help with transfers Do you have any goals in this area?  yes  Function retired I need assistance with the following:  meal prep, household duties and shopping  Neuro/Psych bladder control problems  Prior Studies Any changes since last visit?  no  Physicians involved in your care Any changes since last visit?  no   Family History  Problem Relation Age of Onset  . Lung cancer Father    History   Social History  . Marital Status: Widowed    Spouse Name: N/A    Number of Children: 4  . Years of Education: N/A   Occupational History  . retired from [employer]    Social History Main Topics  . Smoking status: Former Smoker -- 1.00 packs/day for 20 years    Quit date: 11/05/1972  . Smokeless tobacco: Never Used  . Alcohol Use: None  . Drug Use: None  .  Sexual Activity: None   Other Topics Concern  . None   Social History Narrative  . None   Past Surgical History  Procedure Laterality Date  . Thyroidectomy    . Total abdominal hysterectomy w/ bilateral salpingoophorectomy  1968  . Colon resection  1977  . Cervical laminectomy  1995  . Cataract extraction      bilateral  . Breast enhancement surgery      silicone  . Breast surgery    . Posterior cervical fusion/foraminotomy N/A 03/07/2013    Procedure: Cervical four -seven POSTERIOR CERVICAL FUSION;  Surgeon: Mariam Dollar, MD;  Location: [facility];  Service: Neurosurgery;  Laterality: N/A;   Past Medical History  Diagnosis Date  . Osteoarthritis   . Hyperlipidemia   . GERD (gastroesophageal reflux disease)   . Peripheral neuropathy   . Osteopenia   . DDD (degenerative disc disease)   . Shingles   . Colon cancer 1977  . Thyroid cancer 1966  . Peripheral vascular disease   . Deep vein blood clot of left lower extremity 05/06/13   BP 164/88  Pulse 78  Resp 16  Ht 5' 5.5" (1.664 m)  Wt 145 lb (65.772 kg)  BMI 23.75 kg/m2  SpO2 98%     Review of Systems  Genitourinary: Positive for difficulty  urinating.  Musculoskeletal: Positive for back pain and gait problem.  All other systems reviewed and are negative.       Objective:   Physical Exam  General: Alert and oriented x 3, No apparent distress. She is wearing her collar which is too loose.  HEENT: Head is normocephalic, atraumatic, PERRLA, EOMI, sclera anicteric, oral mucosa pink and moist, dentition intact, ext ear canals clear,  Neck: Supple without JVD or lymphadenopathy  Heart: Reg rate and rhythm. No murmurs rubs or gallops  Chest: CTA bilaterally without wheezes, rales, or rhonchi; no distress  Abdomen: Soft, non-tender, non-distended, bowel sounds positive.  Extremities: No clubbing, cyanosis, 1+ edema is noted throughout left lower extremity. Limb is tender with palpation along the left calf and  anterior thigh. homan's test equivocal to positive. Leg is not warm. . Pulses 2+  Skin: wound is dressed. No odor.  Neuro: Pt is cognitively appropriate with improved insight, memory, and awareness. Cranial nerves 2-12 are intact. No nystagmus. Sensory exam is decreased slightly left more than right. Reflexes are 1+ in uppers, 2+ lowers. . Fine motor coordination is intact. No tremors. Motor function is 4/5 in UE prox to distal. She has decreased FMC in both hands with multiple flexor contractures in palm and fingers noted bilaterally. underyling strength is still approximately 3+/5. RLE is 4/5 HF, KE and ankle 4+, LLE is 4-prox to 4/5 distally..  Musculoskeletal: low back tender with palpation. Not necessarily more tender with bending or extending while seated.  Psych: Pt's affect is appropriate. Pt is cooperative   Assessment & Plan:   1. Functional deficits secondary to C6-7 fx's with C6 SCI ASIA C, mild TBI  2. LLE swelling, concerning for a DVT. She has low back pain but it doesn't seem to be necessarily associated with her leg sx  3. Mild reactive mood decrease  4. Dupuytren's contractures of hands, with additional contracture related to her SCI  5. Right heel wound stage 2+ -almost closed   Plan:  1. Continue per wound care recs. This has healed substantially.  2 Xarelto to continue for a period of 6 months.  3. Made a referral to neuro rehab for gait and strength training and for focus on her hands to incrase functional use and decrease contracture. 4. Heat and tylenol for low back. Focus on posture. Check urine today also 5. DC gabapentin as the dose has been reduced to 100mg  (due to cognitive effects) and there is really not much point in taking it any longer at this dose. She did have some cognitive issues while with [place] on rehab. Likely sustained a concussion, probably some mild underlying dementia as well.  6. Follow up with me in 3 months. 15 minutes of face to face patient care time  were spent during this visit. All questions were encouraged and answered. Consider surgical evaluation if hand functionality doesn't improve.

## 2013-10-15 ENCOUNTER — Telehealth: Payer: Self-pay | Admitting: Physical Medicine & Rehabilitation

## 2013-10-15 LAB — URINE CULTURE: Colony Count: 8000

## 2013-10-15 NOTE — Telephone Encounter (Signed)
Let her know that urine culture is negative. thx

## 2013-10-16 NOTE — Telephone Encounter (Signed)
Patients granddaughter aware of negative urine culture results.

## 2013-10-18 ENCOUNTER — Other Ambulatory Visit: Payer: Self-pay | Admitting: Internal Medicine

## 2013-10-22 ENCOUNTER — Ambulatory Visit: Payer: Medicare Other | Attending: Physical Medicine & Rehabilitation | Admitting: Physical Therapy

## 2013-10-22 DIAGNOSIS — R269 Unspecified abnormalities of gait and mobility: Secondary | ICD-10-CM | POA: Insufficient documentation

## 2013-10-22 DIAGNOSIS — I10 Essential (primary) hypertension: Secondary | ICD-10-CM | POA: Insufficient documentation

## 2013-10-22 DIAGNOSIS — IMO0001 Reserved for inherently not codable concepts without codable children: Secondary | ICD-10-CM | POA: Insufficient documentation

## 2013-10-22 DIAGNOSIS — Z981 Arthrodesis status: Secondary | ICD-10-CM | POA: Insufficient documentation

## 2013-10-26 ENCOUNTER — Ambulatory Visit: Payer: Medicare Other | Admitting: Physical Therapy

## 2013-10-27 ENCOUNTER — Ambulatory Visit: Payer: Medicare Other | Admitting: Physical Therapy

## 2013-11-02 ENCOUNTER — Ambulatory Visit: Payer: Medicare Other | Admitting: Physical Therapy

## 2013-11-04 ENCOUNTER — Ambulatory Visit: Payer: Medicare Other | Admitting: Physical Therapy

## 2013-11-10 ENCOUNTER — Ambulatory Visit: Payer: Medicare FFS | Attending: Physical Medicine & Rehabilitation | Admitting: Physical Therapy

## 2013-11-10 DIAGNOSIS — IMO0001 Reserved for inherently not codable concepts without codable children: Secondary | ICD-10-CM | POA: Insufficient documentation

## 2013-11-10 DIAGNOSIS — R269 Unspecified abnormalities of gait and mobility: Secondary | ICD-10-CM | POA: Insufficient documentation

## 2013-11-12 ENCOUNTER — Ambulatory Visit: Payer: Medicare FFS | Admitting: Occupational Therapy

## 2013-11-12 ENCOUNTER — Ambulatory Visit: Payer: Medicare FFS | Admitting: Physical Therapy

## 2013-11-17 ENCOUNTER — Ambulatory Visit: Payer: Medicare FFS | Admitting: Physical Therapy

## 2013-11-17 ENCOUNTER — Ambulatory Visit: Payer: Medicare FFS | Admitting: Occupational Therapy

## 2013-11-19 ENCOUNTER — Ambulatory Visit: Payer: Medicare FFS | Admitting: Occupational Therapy

## 2013-11-19 ENCOUNTER — Ambulatory Visit: Payer: Medicare FFS | Admitting: Physical Therapy

## 2013-11-19 ENCOUNTER — Encounter (HOSPITAL_BASED_OUTPATIENT_CLINIC_OR_DEPARTMENT_OTHER): Payer: Medicare FFS | Attending: Internal Medicine

## 2013-11-19 DIAGNOSIS — L8992 Pressure ulcer of unspecified site, stage 2: Secondary | ICD-10-CM | POA: Insufficient documentation

## 2013-11-19 DIAGNOSIS — L89609 Pressure ulcer of unspecified heel, unspecified stage: Secondary | ICD-10-CM | POA: Insufficient documentation

## 2013-11-24 ENCOUNTER — Ambulatory Visit: Payer: Medicare FFS | Admitting: Occupational Therapy

## 2013-11-24 ENCOUNTER — Ambulatory Visit: Payer: Medicare FFS | Admitting: Physical Therapy

## 2013-11-26 ENCOUNTER — Encounter: Payer: Self-pay | Admitting: Internal Medicine

## 2013-11-26 ENCOUNTER — Ambulatory Visit (INDEPENDENT_AMBULATORY_CARE_PROVIDER_SITE_OTHER): Payer: Medicare FFS | Admitting: Internal Medicine

## 2013-11-26 ENCOUNTER — Ambulatory Visit: Payer: Medicare FFS | Admitting: Occupational Therapy

## 2013-11-26 ENCOUNTER — Ambulatory Visit: Payer: Medicare FFS | Admitting: Physical Therapy

## 2013-11-26 VITALS — BP 122/76 | HR 72 | Temp 96.6°F | Resp 16 | Wt 137.0 lb

## 2013-11-26 DIAGNOSIS — I10 Essential (primary) hypertension: Secondary | ICD-10-CM

## 2013-11-26 DIAGNOSIS — S76219A Strain of adductor muscle, fascia and tendon of unspecified thigh, initial encounter: Secondary | ICD-10-CM

## 2013-11-26 DIAGNOSIS — M159 Polyosteoarthritis, unspecified: Secondary | ICD-10-CM

## 2013-11-26 DIAGNOSIS — G309 Alzheimer's disease, unspecified: Secondary | ICD-10-CM

## 2013-11-26 DIAGNOSIS — F0280 Dementia in other diseases classified elsewhere without behavioral disturbance: Secondary | ICD-10-CM

## 2013-11-26 MED ORDER — PREDNISONE 20 MG PO TABS: 20.0000 mg | ORAL_TABLET | ORAL | Status: DC

## 2013-11-26 MED ORDER — MELOXICAM 15 MG PO TABS: 15.0000 mg | ORAL_TABLET | Freq: Every day | ORAL | Status: DC

## 2013-11-26 NOTE — Progress Notes (Signed)
Patient ID: Cassandra Walker, female   DOB: November 21, 1931, 80 y.o.   MRN: [id number]   This very nice 80 y.o. female presents for 1 month  follow up with Hypertension, Hyperlipidemia, Pre-Diabetes and Vitamin D Deficiency. Primarily today she is complaining of Rt hip and leg pain while walking. She had similar c/o in Aug 2014 and was d=Dx'd with rt Groin Strain or Adductor Tendonitis. Patient continues with PT & OT at home and is up and abut with a walker.   HTN predates since the 1990's. BP has been controlled at home. Today's BP: 122/76 mmHg . Patient denies any cardiac type chest pain, palpitations, dyspnea/orthopnea/PND, dizziness, claudication, or dependent edema.     Medication List         atenolol 25 MG tablet  Commonly known as:  TENORMIN  Take 1 tablet (25 mg total) by mouth 2 (two) times daily.     fish oil-omega-3 fatty acids 1000 MG capsule  Take 2 g by mouth daily.     levothyroxine 75 MCG tablet  Commonly known as:  SYNTHROID, LEVOTHROID  Take 75 mcg by mouth daily.     LIPITOR 10 MG tablet  Generic drug:  atorvastatin  Take 5 mg by mouth every evening.     lisinopril 20 MG tablet  Commonly known as:  PRINIVIL,ZESTRIL  Take 1 tablet (20 mg total) by mouth daily.     Magnesium 250 MG Tabs  Take by mouth 2 (two) times daily at 10 AM and 5 PM.     meclizine 25 MG tablet  Commonly known as:  ANTIVERT  TAKE 1 TABLET BY MOUTH THREE TIMES DAILY AS NEEDED     meloxicam 15 MG tablet  Commonly known as:  MOBIC  Take 1 tablet (15 mg total) by mouth daily. For Pain and inflammation     ondansetron 4 MG tablet  Commonly known as:  ZOFRAN  Take 4 mg by mouth every 8 (eight) hours as needed for nausea.     predniSONE 20 MG tablet  Commonly known as:  DELTASONE  Take 1 tablet (20 mg total) by mouth See admin instructions. 1 tab 3 x day for 3 days, then 1 tab 2 x day for 3 days, then 1 tab 1 x day for 5 days     Rivaroxaban 20 MG Tabs tablet  Commonly known as:  XARELTO   Take 1 tablet (20 mg total) by mouth daily.     traMADol 50 MG tablet  Commonly known as:  ULTRAM  Take 50 mg by mouth every 8 (eight) hours.     VITAMIN D3 HIGH POTENCY PO  Take 8,000 Units by mouth daily.         Allergies  Allergen Reactions  . Codeine Other (See Comments)    Jittery, nervous & hallucinations    PMHx:   Past Medical History  Diagnosis Date  . Osteoarthritis   . Hyperlipidemia   . GERD (gastroesophageal reflux disease)   . Peripheral neuropathy   . Osteopenia   . DDD (degenerative disc disease)   . Shingles   . Colon cancer 1977  . Thyroid cancer 1966  . Peripheral vascular disease   . Deep vein blood clot of left lower extremity 05/06/13    FHx:    Reviewed / unchanged  SHx:    Reviewed / unchanged  Systems Review: Constitutional: Denies fever, chills, wt changes, headaches, insomnia, fatigue, night sweats, change in appetite. Eyes: Denies redness, blurred vision, diplopia,  discharge, itchy, watery eyes.  ENT: Denies discharge, congestion, post nasal drip, epistaxis, sore throat, earache, hearing loss, dental pain, tinnitus, vertigo, sinus pain, snoring.  CV: Denies chest pain, palpitations, irregular heartbeat, syncope, dyspnea, diaphoresis, orthopnea, PND, claudication, edema. Respiratory: denies cough, dyspnea, DOE, pleurisy, hoarseness, laryngitis, wheezing.  Gastrointestinal: Denies dysphagia, odynophagia, heartburn, reflux, water brash, abdominal pain or cramps, nausea, vomiting, bloating, diarrhea, constipation, hematemesis, melena, hematochezia,  or hemorrhoids. Genitourinary: Denies dysuria, frequency, urgency, nocturia, hesitancy, discharge, hematuria, flank pain. Musculoskeletal:Painds Rt hip thigh as above.  Skin: Denies pruritus, rash, hives, warts, acne, eczema, change in skin lesion(s). Neuro: No weakness, tremor, incoordination, spasms, paresthesia, or pain. Psychiatric: Denies confusion, memory loss, or sensory loss. Endo: Denies  change in weight, skin, hair change.  Heme/Lymph: No excessive bleeding, bruising, orenlarged lymph nodes.  Filed Vitals:   11/26/13 1017  BP: 122/76  Pulse: 72  Temp: 96.6 F (35.9 C)  Resp: 16    Estimated body mass index is 22.44 kg/(m^2) as calculated from the following:   Height as of 10/13/13: 5' 5.5" (1.664 m).   Weight as of this encounter: 137 lb (62.143 kg).  On Exam: Appears well nourished - in no distress. Eyes: PERRLA, EOMs, conjunctiva no swelling or erythema. Sinuses: No frontal/maxillary tenderness ENT/Mouth: EAC's clear, TM's nl w/o erythema, bulging. Nares clear w/o erythema, swelling, exudates. Oropharynx clear without erythema or exudates. Oral hygiene is good. Tongue normal, non obstructing. Hearing intact.  Neck: Supple. Thyroid nl. Car 2+/2+ without bruits, nodes or JVD. Chest: Respirations nl with BS clear & equal w/o rales, rhonchi, wheezing or stridor.  Cor: Heart sounds normal w/ regular rate and rhythm without sig. murmurs, gallops, clicks, or rubs. Peripheral pulses normal and equal  without edema.   Musculoskeletal: Walking with a walker for balance. Has pin Rt hip with internal/external rotation . Has tenderness at insertion of adductor tendons of Rt Hip/Thigh. Skin: Warm, dry without exposed rashes, lesions, ecchymosis apparent.  Neuro: Cranial nerves intact, reflexes equal bilaterally. Sensory-motor testing grossly intact. Tendon reflexes grossly intact.  Pysch: Alert & oriented x 3. Insight and judgement nl & appropriate. No ideations.  Assessment and Plan:  1. Rt Hip Strain/Adductor Tendonitis - Trial Prednisone pulse/taper  2.DJD - Trial Meloxicam after finish Prednisone  Recommended regular stretching exercises and continue with LPT/OT

## 2013-11-26 NOTE — Patient Instructions (Addendum)
Take the prednisone first ( 10 days)  Then Try the Meloxicam 15 mg for pain and inflammation (fist try 1/2 tablet)   Groin Strain A groin strain (also called a groin pull) is an injury to the muscles or tendon on the upper inner part of the thigh. These muscles are called the adductor muscles or groin muscles. They are responsible for moving the leg across the body. A muscle strain occurs when a muscle is overstretched and some muscle fibers are torn. A groin strain can range from mild to severe depending on how many muscle fibers are affected and whether the muscle fibers are partially or completely torn.  Groin strains usually occur during exercise or participation in sports. The injury often happens when a sudden, violent force is placed on a muscle, stretching the muscle too far. A strain is more likely to occur when your muscles are not warmed up or if you are not properly conditioned. Depending on the severity of the groin strain, recovery time may vary from a few weeks to several weeks. Severe injuries often require 4 6 weeks for recovery. In these cases, complete healing can take 4 5 months.  CAUSES   Stretching the groin muscles too far or too suddenly, often during side-to-side motion with an abrupt change in direction.  Putting repeated stress on the groin muscles over a long period of time.  Performing vigorous activity without properly stretching the groin muscles beforehand. SYMPTOMS   Pain and tenderness in the groin area. This begins as sharp pain and persists as a dull ache.  Popping or snapping feeling when the injury occurs (for severe strains).  Swelling or bruising.  Muscle spasms.  Weakness in the leg.  Stiffness in the groin area with decreased ability to move the affected muscles. DIAGNOSIS  Your caregiver will perform a physical exam to diagnose a groin strain. You will be asked about your symptoms and how the injury occurred. X-rays are sometimes needed to rule  out a broken bone or cartilage problems. Your caregiver may order a CT scan or MRI if a complete muscle tear is suspected. TREATMENT  A groin strain will often heal on its own. Your caregiver may prescribe medicines to help manage pain and swelling (anti-inflammatory medicine). You may be told to use crutches for the first few days to minimize your pain. HOME CARE INSTRUCTIONS   Rest. Do not use the strained muscle if it causes pain.  Put ice on the injured area.  Put ice in a plastic bag.  Place a towel between your skin and the bag.  Leave the ice on for 15 20 minutes, every 2 3 hours. Do this for the first 2 days after the injury.  Only take over-the-counter or prescription medicines as directed by your caregiver.  Wrap the injured area with an elastic bandage as directed by your caregiver.  Keep the injured leg raised (elevated).  Walk, stretch, and perform range-of-motion exercises to improve blood flow to the injured area. Only perform these activities if you can do so without any pain. To prevent muscle strains:  Warm up before exercise.  Develop proper conditioning and strength in the groin muscles. SEEK IMMEDIATE MEDICAL CARE IF:   You have increased pain or swelling in the affected area.   Your symptoms are not improving or are getting worse. MAKE SURE YOU:   Understand these instructions.  Will watch your condition.  Will get help right away if you are not doing well or  get worse. Document Released: 06/19/2004 Document Revised: 10/08/2012 Document Reviewed: 06/25/2012 [facility] Patient Information 2014 [place], [place].  Osteoarthritis Osteoarthritis is a disease that causes soreness and swelling (inflammation) of a joint. It occurs when the cartilage at the affected joint wears down. Cartilage acts as a cushion, covering the ends of bones where they meet to form a joint. Osteoarthritis is the most common form of arthritis. It often occurs in older people. The  joints affected most often by this condition include those in the:  Ends of the fingers.  Thumbs.  Neck.  Lower back.  Knees.  Hips. CAUSES  Over time, the cartilage that covers the ends of bones begins to wear away. This causes bone to rub on bone, producing pain and stiffness in the affected joints.  RISK FACTORS Certain factors can increase your chances of having osteoarthritis, including:  Older age.  Excessive body weight.  Overuse of joints. SIGNS AND SYMPTOMS   Pain, swelling, and stiffness in the joint.  Over time, the joint may lose its normal shape.  Small deposits of bone (osteophytes) may grow on the edges of the joint.  Bits of bone or cartilage can break off and float inside the joint space. This may cause more pain and damage. DIAGNOSIS  Your health care provider will do a physical exam and ask about your symptoms. Various tests may be ordered, such as:  X-rays of the affected joint.  An MRI scan.  Blood tests to rule out other types of arthritis.  Joint fluid tests. This involves using a needle to draw fluid from the joint and examining the fluid under a microscope. TREATMENT  Goals of treatment are to control pain and improve joint function. Treatment plans may include:  A prescribed exercise program that allows for rest and joint relief.  A weight control plan.  Pain relief techniques, such as:  Properly applied heat and cold.  Electric pulses delivered to nerve endings under the skin (transcutaneous electrical nerve stimulation, TENS).  Massage.  Certain nutritional supplements.  Medicines to control pain, such as:  Acetaminophen.  Nonsteroidal anti-inflammatory drugs (NSAIDs), such as naproxen.  Narcotic or central-acting agents, such as tramadol.  Corticosteroids. These can be given orally or as an injection.  Surgery to reposition the bones and relieve pain (osteotomy) or to remove loose pieces of bone and cartilage. Joint  replacement may be needed in advanced states of osteoarthritis. HOME CARE INSTRUCTIONS   Only take over-the-counter or prescription medicines as directed by your health care provider. Take all medicines exactly as instructed.  Maintain a healthy weight. Follow your health care provider's instructions for weight control. This may include dietary instructions.  Exercise as directed. Your health care provider can recommend specific types of exercise. These may include:  Strengthening exercises These are done to strengthen the muscles that support joints affected by arthritis. They can be performed with weights or with exercise bands to add resistance.  Aerobic activities These are exercises, such as brisk walking or low-impact aerobics, that get your heart pumping.  Range-of-motion activities These keep your joints limber.  Balance and agility exercises These help you maintain daily living skills.  Rest your affected joints as directed by your health care provider.  Follow up with your health care provider as directed. SEEK MEDICAL CARE IF:   Your skin turns red.  You develop a rash in addition to your joint pain.  You have worsening joint pain. SEEK IMMEDIATE MEDICAL CARE IF:  You have a significant  loss of weight or appetite.  You have a fever along with joint or muscle aches.  You have night sweats. [place] of Arthritis and Musculoskeletal and Skin Diseases: www.niams.SouthExposed.es [employer] on Aging: http://kim-miller.com/ American College of Rheumatology: www.rheumatology.org Document Released: 10/22/2005 Document Revised: 08/12/2013 Document Reviewed: 06/29/2013 [facility] Patient Information 2014 [place], [place].

## 2013-11-30 ENCOUNTER — Ambulatory Visit: Payer: Medicare FFS | Admitting: Occupational Therapy

## 2013-11-30 ENCOUNTER — Ambulatory Visit: Payer: Medicare FFS | Admitting: Physical Therapy

## 2013-12-03 ENCOUNTER — Ambulatory Visit: Payer: Medicare FFS | Admitting: Physical Therapy

## 2013-12-03 ENCOUNTER — Ambulatory Visit: Payer: Medicare FFS | Admitting: Occupational Therapy

## 2013-12-05 ENCOUNTER — Other Ambulatory Visit: Payer: Self-pay | Admitting: Emergency Medicine

## 2013-12-07 ENCOUNTER — Ambulatory Visit: Payer: Medicare FFS | Admitting: Occupational Therapy

## 2013-12-07 ENCOUNTER — Ambulatory Visit: Payer: Medicare FFS | Attending: Physical Medicine & Rehabilitation | Admitting: Physical Therapy

## 2013-12-07 DIAGNOSIS — R269 Unspecified abnormalities of gait and mobility: Secondary | ICD-10-CM | POA: Insufficient documentation

## 2013-12-07 DIAGNOSIS — IMO0001 Reserved for inherently not codable concepts without codable children: Secondary | ICD-10-CM | POA: Insufficient documentation

## 2013-12-10 ENCOUNTER — Ambulatory Visit: Payer: Medicare FFS | Admitting: Occupational Therapy

## 2013-12-10 ENCOUNTER — Ambulatory Visit: Payer: Medicare FFS | Admitting: Physical Therapy

## 2013-12-14 ENCOUNTER — Ambulatory Visit: Payer: Medicare FFS | Admitting: Physical Therapy

## 2013-12-14 ENCOUNTER — Ambulatory Visit: Payer: Medicare FFS | Admitting: Occupational Therapy

## 2013-12-16 ENCOUNTER — Ambulatory Visit: Payer: Medicare FFS | Admitting: *Deleted

## 2013-12-16 ENCOUNTER — Ambulatory Visit: Payer: Medicare FFS | Admitting: Physical Therapy

## 2013-12-21 ENCOUNTER — Ambulatory Visit: Payer: Medicare FFS | Admitting: Occupational Therapy

## 2013-12-21 ENCOUNTER — Ambulatory Visit: Payer: Medicare FFS | Admitting: Physical Therapy

## 2013-12-24 ENCOUNTER — Ambulatory Visit: Payer: Medicare FFS

## 2013-12-24 ENCOUNTER — Ambulatory Visit: Payer: Medicare FFS | Admitting: Occupational Therapy

## 2013-12-24 ENCOUNTER — Encounter: Payer: Medicare FFS | Admitting: Occupational Therapy

## 2013-12-29 ENCOUNTER — Encounter: Payer: Medicare FFS | Admitting: Occupational Therapy

## 2013-12-29 ENCOUNTER — Ambulatory Visit: Payer: Medicare FFS | Admitting: Physical Therapy

## 2013-12-31 ENCOUNTER — Encounter: Payer: Medicare FFS | Admitting: Occupational Therapy

## 2013-12-31 ENCOUNTER — Ambulatory Visit: Payer: Medicare FFS | Admitting: Physical Therapy

## 2014-01-05 ENCOUNTER — Other Ambulatory Visit: Payer: Self-pay | Admitting: Internal Medicine

## 2014-01-05 ENCOUNTER — Ambulatory Visit: Payer: Medicare FFS | Admitting: Occupational Therapy

## 2014-01-05 ENCOUNTER — Ambulatory Visit: Payer: Medicare FFS | Attending: Physical Medicine & Rehabilitation | Admitting: Physical Therapy

## 2014-01-05 DIAGNOSIS — R269 Unspecified abnormalities of gait and mobility: Secondary | ICD-10-CM | POA: Insufficient documentation

## 2014-01-05 DIAGNOSIS — IMO0001 Reserved for inherently not codable concepts without codable children: Secondary | ICD-10-CM | POA: Insufficient documentation

## 2014-01-07 ENCOUNTER — Ambulatory Visit: Payer: Medicare FFS

## 2014-01-07 ENCOUNTER — Ambulatory Visit: Payer: Medicare FFS | Admitting: Occupational Therapy

## 2014-01-11 ENCOUNTER — Ambulatory Visit: Payer: Medicare FFS | Admitting: Occupational Therapy

## 2014-01-11 ENCOUNTER — Encounter: Payer: Medicare FFS | Attending: Physical Medicine & Rehabilitation | Admitting: Physical Medicine & Rehabilitation

## 2014-01-11 ENCOUNTER — Encounter: Payer: Self-pay | Admitting: Physical Medicine & Rehabilitation

## 2014-01-11 ENCOUNTER — Ambulatory Visit: Payer: Medicare FFS | Admitting: Physical Therapy

## 2014-01-11 VITALS — BP 151/71 | HR 83 | Resp 14 | Ht 65.0 in | Wt 140.0 lb

## 2014-01-11 DIAGNOSIS — X58XXXS Exposure to other specified factors, sequela: Secondary | ICD-10-CM | POA: Insufficient documentation

## 2014-01-11 DIAGNOSIS — S12600A Unspecified displaced fracture of seventh cervical vertebra, initial encounter for closed fracture: Secondary | ICD-10-CM

## 2014-01-11 DIAGNOSIS — N319 Neuromuscular dysfunction of bladder, unspecified: Secondary | ICD-10-CM | POA: Insufficient documentation

## 2014-01-11 DIAGNOSIS — G8254 Quadriplegia, C5-C7 incomplete: Secondary | ICD-10-CM | POA: Insufficient documentation

## 2014-01-11 DIAGNOSIS — M720 Palmar fascial fibromatosis [Dupuytren]: Secondary | ICD-10-CM | POA: Insufficient documentation

## 2014-01-11 DIAGNOSIS — S12500A Unspecified displaced fracture of sixth cervical vertebra, initial encounter for closed fracture: Secondary | ICD-10-CM

## 2014-01-11 MED ORDER — OXYBUTYNIN CHLORIDE 5 MG PO TABS: 5.0000 mg | ORAL_TABLET | Freq: Two times a day (BID) | ORAL | Status: DC

## 2014-01-11 NOTE — Patient Instructions (Signed)
WEAR KNEE HIGH COMPRESSION STOCKING DAILY!!!!   CONTINUE XARELTO FOR TWO MORE MONTHS

## 2014-01-11 NOTE — Progress Notes (Signed)
Subjective:    Patient ID: Cassandra Walker, female    DOB: [birth year], 80 y.o.   MRN: [id number]  HPI  Cassandra Walker is back regarding her SCI. She has lost weight since I last saw her. She hasn't had much of an appetite, especially at lunch.   Still urgency and frequency with her bladder which has increased since I last saw her. She hasn't had a fever or chills.    She is having difficulties with contractures in her hands. She has a history of dupuytren's contractures which were made worse with the SCI. Therapy has not achieved the goals she had hoped for.   Her feet have been blue or dark red at times. Sometimes associated with pain. She has noticed occasionally that her toes look black.   Pain Inventory Average Pain 6 Pain Right Now 0 My pain is aching  In the last 24 hours, has pain interfered with the following? General activity 2 Relation with others 0 Enjoyment of life 3 What TIME of day is your pain at its worst? daytime Sleep (in general) Fair  Pain is worse with: walking and standing Pain improves with: medication Relief from Meds: 3  Mobility walk with assistance use a walker how many minutes can you walk? 10 ability to climb steps?  yes do you drive?  no transfers alone Do you have any goals in this area?  yes  Function retired I need assistance with the following:  bathing, meal prep and household duties  Neuro/Psych bladder control problems  Prior Studies Any changes since last visit?  no  Physicians involved in your care Any changes since last visit?  no   Family History  Problem Relation Age of Onset  . Lung cancer Father    History   Social History  . Marital Status: Widowed    Spouse Name: N/A    Number of Children: 4  . Years of Education: N/A   Occupational History  . retired from [employer]  . Smoking status: Former Smoker -- 1.00 packs/day for 20 years    Quit date: 11/05/1972  . Smokeless  tobacco: Never Used  . Alcohol Use: No  . Drug Use: None  . Sexual Activity: None   Other Topics Concern  . None   Social History Narrative  . None   Past Surgical History  Procedure Laterality Date  . Thyroidectomy    . Total abdominal hysterectomy w/ bilateral salpingoophorectomy  1968  . Colon resection  1977  . Cervical laminectomy  1995  . Cataract extraction      bilateral  . Breast enhancement surgery      silicone  . Breast surgery    . Posterior cervical fusion/foraminotomy N/A 03/07/2013    Procedure: Cervical four -seven POSTERIOR CERVICAL FUSION;  Surgeon: Elaina Hoops, MD;  Location: [facility];  Service: Neurosurgery;  Laterality: N/A;   Past Medical History  Diagnosis Date  . Osteoarthritis   . Hyperlipidemia   . GERD (gastroesophageal reflux disease)   . Peripheral neuropathy   . Osteopenia   . DDD (degenerative disc disease)   . Shingles   . Colon cancer 1977  . Thyroid cancer 1966  . Peripheral vascular disease   . Deep vein blood clot of left lower extremity 05/06/13   BP 151/71  Pulse 83  Resp 14  Ht 5\' 5"  (1.651 m)  Wt 140 lb (63.504 kg)  BMI 23.30 kg/m2  SpO2 98%  Opioid Risk Score:   Fall Risk Score: Moderate Fall Risk (6-13 points) (pt educated on fall risk, brochure given to pt.)    Review of Systems  Genitourinary:       Bladder control problems  All other systems reviewed and are negative.       Objective:   Physical Exam  General: Alert and oriented x 3, No apparent distress. She is wearing her collar which is too loose.  HEENT: Head is normocephalic, atraumatic, PERRLA, EOMI, sclera anicteric, oral mucosa pink and moist, dentition intact, ext ear canals clear,  Neck: Supple without JVD or lymphadenopathy  Heart: Reg rate and rhythm. No murmurs rubs or gallops  Chest: CTA bilaterally without wheezes, rales, or rhonchi; no distress  Abdomen: Soft, non-tender, non-distended, bowel sounds positive.  Extremities: No clubbing,  1+ edema only at feet. Feet are purple/blue, blanch easily. Shoe and sock are tourniqueting the foot to a degree. Pulses 2+  Skin: wound is dressed. No odor.  Neuro: Pt is cognitively appropriate with improved insight, memory, and awareness. Cranial nerves 2-12 are intact. No nystagmus. Sensory exam is decreased slightly left more than right. Reflexes are 1+ in uppers, 2+ lowers. . Fine motor coordination is intact. No tremors. Motor function is 4/5 in UE prox to distal. She has decreased [place] in both hands with multiple flexor contractures in palm and fingers noted bilaterally. underyling strength is still approximately 3+ to 4/5. RLE is 4/5 HF, KE and ankle 4+, LLE is 4-prox to 4/5 distally. She walked with a shuffling but steady gait. She changes directions easily.  Musculoskeletal: low back tender with palpation. Not necessarily more tender with bending or extending while seated.  Psych: Pt's affect is appropriate. Pt is cooperative   Assessment & Plan:   1. Functional deficits secondary to C6-7 fx's with C6 SCI ASIA C, mild TBI  2. LLE DVT 3. Mild reactive mood decrease  4. Dupuytren's contractures of hands, with additional contracture related to her SCI  5. Right heel wound stage 2+ -almost closed  6. Neurogenic/overactive bladder.  7. "blue feet"---may be blood pooling vs insufficiency  Plan:  1. ABI's were done 3 months ago and were normal. Encouraged elevation and use of knee high TEDS. 2 Xarelto to continue for a period of 2 more months to make 9 total (given her ongoing weakness)  3. Continue with neuro rehab for gait and strength training and for focus on her hands to incrase functional use and decrease contracture.  4. Make a referral to hand surgery for options. Could consider botox  Before any potential surgery.   5. Check urine today. And start ditropan 5mg  bid.   6. Follow up with me in 2 months. 30 minutes of face to face patient care time were spent during this visit. All  questions were encouraged and answered. Consider surgical evaluation if hand functionality doesn't improve.

## 2014-01-12 LAB — URINALYSIS
BILIRUBIN URINE: NEGATIVE
Glucose, UA: NEGATIVE mg/dL
Hgb urine dipstick: NEGATIVE
Ketones, ur: NEGATIVE mg/dL
NITRITE: NEGATIVE
PROTEIN: NEGATIVE mg/dL
SPECIFIC GRAVITY, URINE: 1.011 (ref 1.005–1.030)
UROBILINOGEN UA: 0.2 mg/dL (ref 0.0–1.0)
pH: 6.5 (ref 5.0–8.0)

## 2014-01-13 ENCOUNTER — Telehealth: Payer: Self-pay | Admitting: Physical Medicine & Rehabilitation

## 2014-01-13 LAB — URINE CULTURE

## 2014-01-13 NOTE — Telephone Encounter (Signed)
Please inform pt/family that ucx negative

## 2014-01-14 ENCOUNTER — Ambulatory Visit: Payer: Medicare FFS | Admitting: Occupational Therapy

## 2014-01-14 ENCOUNTER — Ambulatory Visit: Payer: Medicare FFS | Admitting: Physical Therapy

## 2014-01-14 NOTE — Telephone Encounter (Signed)
Contacted patient to inform her that urine culture was negative.

## 2014-01-18 ENCOUNTER — Ambulatory Visit: Payer: Medicare FFS

## 2014-01-21 ENCOUNTER — Ambulatory Visit: Payer: Medicare FFS | Admitting: Physical Therapy

## 2014-01-21 ENCOUNTER — Ambulatory Visit: Payer: Medicare FFS | Admitting: Occupational Therapy

## 2014-01-25 ENCOUNTER — Ambulatory Visit: Payer: Medicare FFS | Admitting: Physical Therapy

## 2014-01-25 ENCOUNTER — Ambulatory Visit: Payer: Medicare FFS | Admitting: Occupational Therapy

## 2014-01-27 ENCOUNTER — Encounter: Payer: Self-pay | Admitting: Emergency Medicine

## 2014-01-27 ENCOUNTER — Ambulatory Visit (INDEPENDENT_AMBULATORY_CARE_PROVIDER_SITE_OTHER): Payer: Medicare FFS | Admitting: Emergency Medicine

## 2014-01-27 VITALS — BP 132/64 | HR 76 | Temp 98.6°F | Resp 16 | Ht 65.25 in | Wt 135.0 lb

## 2014-01-27 DIAGNOSIS — I10 Essential (primary) hypertension: Secondary | ICD-10-CM

## 2014-01-27 DIAGNOSIS — E782 Mixed hyperlipidemia: Secondary | ICD-10-CM

## 2014-01-27 DIAGNOSIS — R7309 Other abnormal glucose: Secondary | ICD-10-CM

## 2014-01-27 DIAGNOSIS — Z0000 Encounter for general adult medical examination without abnormal findings: Secondary | ICD-10-CM

## 2014-01-27 DIAGNOSIS — N3281 Overactive bladder: Secondary | ICD-10-CM

## 2014-01-27 DIAGNOSIS — Z789 Other specified health status: Secondary | ICD-10-CM

## 2014-01-27 DIAGNOSIS — Z1331 Encounter for screening for depression: Secondary | ICD-10-CM

## 2014-01-27 LAB — HEMOGLOBIN A1C
Hgb A1c MFr Bld: 6.5 % — ABNORMAL HIGH (ref <5.7)
Mean Plasma Glucose: 140 mg/dL — ABNORMAL HIGH (ref <117)

## 2014-01-27 LAB — CBC WITH DIFFERENTIAL/PLATELET
Basophils Absolute: 0 10*3/uL (ref 0.0–0.1)
Basophils Relative: 0 % (ref 0–1)
EOS PCT: 2 % (ref 0–5)
Eosinophils Absolute: 0.2 10*3/uL (ref 0.0–0.7)
HCT: 38.3 % (ref 36.0–46.0)
Hemoglobin: 12.9 g/dL (ref 12.0–15.0)
LYMPHS PCT: 26 % (ref 12–46)
Lymphs Abs: 2.7 10*3/uL (ref 0.7–4.0)
MCH: 27.6 pg (ref 26.0–34.0)
MCHC: 33.7 g/dL (ref 30.0–36.0)
MCV: 82 fL (ref 78.0–100.0)
Monocytes Absolute: 0.8 10*3/uL (ref 0.1–1.0)
Monocytes Relative: 8 % (ref 3–12)
Neutro Abs: 6.6 10*3/uL (ref 1.7–7.7)
Neutrophils Relative %: 64 % (ref 43–77)
PLATELETS: 316 10*3/uL (ref 150–400)
RBC: 4.67 MIL/uL (ref 3.87–5.11)
RDW: 14.9 % (ref 11.5–15.5)
WBC: 10.3 10*3/uL (ref 4.0–10.5)

## 2014-01-27 NOTE — Patient Instructions (Signed)
Cerumen Impaction The structures of the external ear canal secrete a waxy substance known as cerumen. Excess cerumen can build up in the ear canal, causing a condition known as cerumen impaction. Cerumen impaction can cause ear pain as well as disrupt the function of the ear. The rate of cerumen production differs for each individual. For certain individuals, the configuration of one's ear canal may cause him or her to have a decreased ability to naturally remove cerumen. It is important to note that removing cerumen as a part of normal hygiene is not necessary, and the use of swabs in the ear canal is not recommended. SYMPTOMS   Diminished hearing.  Ear drainage.  Ear pain.  Ear itch. CAUSES  Excessive cerumen production.  RISK INCREASES WITH:  Frequent use of swabs to clean ears.  Narrow ear canals.  Eczema (a skin condition).  Dehydration. PREVENTION  Do Not insert objects into the ear, even with the intent of cleaning the ear.  Maintain hydration.  Control eczema if present. TREATMENT  Maintaining preventative measures is the best way to treat cerumen impaction. If symptoms of cerumen impaction develop the first step is to use over-the-counter or prescription ear drops that are intended to soften the cerumen. If the cerumen does not clear, then visit your caregiver to have the cerumen removed. The most common method for cerumen removal is through irrigation with warm water. Although, some caregivers use ear curettes and other instruments to remove the cerumen physically. In the most severe cases, cerumen may be removed surgically. Document Released: 10/22/2005 Document Revised: 01/14/2012 Document Reviewed: 02/03/2009 [facility] Patient Information 2014 [place], [place]. Overactive Bladder, Adult The bladder has two functions that are totally opposite of the other. One is to relax and stretch out so it can store urine (fills like a balloon), and the other is to contract and  squeeze down so that it can empty the urine that it has stored. Proper functioning of the bladder is a complex mixing of these two functions. The filling and emptying of the bladder can be influenced by:  The bladder.  The spinal cord.  The brain.  The nerves going to the bladder.  Other organs that are closely related to the bladder such as prostate in males and the vagina in females. As your bladder fills with urine, nerve signals are sent from the bladder to the brain to tell you that you may need to urinate. Normal urination requires that the bladder squeeze down with sufficient strength to empty the bladder, but this also requires that the bladder squeeze down sufficiently long to finish the job. In addition the sphincter muscles, which normally keep you from leaking urine, must also relax so that the urine can pass. Coordination between the bladder muscle squeezing down and the sphincter muscles relaxing is required to make everything happen normally. With an overactive bladder sometimes the muscles of the bladder contract unexpectedly and involuntarily and this causes an urgent need to urinate. The normal response is to try to hold urine in by contracting the sphincter muscles. Sometimes the bladder contracts so strongly that the sphincter muscles cannot stop the urine from passing out and incontinence occurs. This kind of incontinence is called urge incontinence. Having an overactive bladder can be embarrassing and awkward. It can keep you from living life the way you want to. Many people think it is just something you have to put up with as you grow older or have certain health conditions. In fact, there are treatments  that can help make your life easier and more pleasant. CAUSES  Many things can cause an overactive bladder. Possibilities include:  Urinary tract infection or infection of nearby tissues such as the prostate.  Prostate enlargement.  In women, multiple pregnancies or  surgery on the uterus or urethra.  Bladder stones, inflammation or tumors.  Caffeine.  Alcohol.  Medications. For example, diuretics (drugs that help the body get rid of extra fluid) increase urine production. Some other medicines must be taken with lots of fluids.  Muscle or nerve weakness. This might be the result of a spinal cord injury, a stroke, multiple sclerosis or Parkinson's disease.  Diabetes can cause a high urine volume which fills the bladder so quickly that the normal urge to urinate is triggered very strongly. SYMPTOMS   Loss of bladder control. You feel the need to urinate and cannot make your body wait.  Sudden, strong urges to urinate.  Urinating 8 or more times a day.  Waking up to urinate two or more times a night. DIAGNOSIS  To decide if you have overactive bladder, your healthcare provider will probably:  Ask about symptoms you have noticed.  Ask about your overall health. This will include questions about any medications you are taking.  Do a physical examination. This will help determine if there are obvious blockages or other problems.  Order some tests. These might include:  A blood test to check for diabetes or other health issues that could be contributing to the problem.  Urine testing. This could measure the flow of urine and the pressure on the bladder.  A test of your neurological system (the brain, spinal cord and nerves). This is the system that senses the need to urinate. Some of these tests are called flow tests, bladder pressure tests and electrical measurements of the sphincter muscle.  A bladder test to check whether it is emptying completely when you urinate.  Cytoscopy. This test uses a thin tube with a tiny camera on it. It offers a look inside your urethra and bladder to see if there are problems.  Imaging tests. You might be given a contrast dye and then asked to urinate. X-rays are taken to see how your bladder is  working. TREATMENT  An overactive bladder can be treated in many ways. The treatment will depend on the cause. Whether you have a mild or severe case also makes a difference. Often, treatment can be given in your healthcare provider's office or clinic. Be sure to discuss the different options with your caregiver. They include:  Behavioral treatments. These do not involve medication or surgery:  Bladder training. For this, you would follow a schedule to urinate at regular intervals. This helps you learn to control the urge to urinate. At first, you might be asked to wait a few minutes after feeling the urge. In time, you should be able to schedule bathroom visits an hour or more apart.  Kegel exercises. These exercises strengthen the pelvic floor muscles, which support the bladder. By toning these muscles, they can help control urination, even if the bladder muscles are overactive. A specialist will teach you how to do these exercises correctly. They will require daily practice.  Weight loss. If you are obese or overweight, losing weight might stop your bladder from being overactive. Talk to your healthcare provider about how many pounds you should lose. Also ask if there is a specific program or method that would work best for you.  Diet change. This might  be suggested if constipation is making your overactive bladder worse. Your healthcare provider or a nutritionist can explain ways to change what you eat to ease constipation. Other people might need to take in less caffeine or alcohol. Sometimes drinking fewer fluids is needed, too.  Protection. This is not an actual treatment. But, you could wear special pads to take care of any leakage while you wait for other treatments to take effect. This will help you avoid embarrassment.  Physical treatments.  Electrical stimulation. Electrodes will send gentle pulses to the nerves or muscles that help control the bladder. The goal is to strengthen them.  Sometimes this is done with the electrodes outside of the body. Or, they might be placed inside the body (implanted). This treatment can take several months to have an effect.  Medications. These are usually used along with other treatments. Several medicines are available. Some are injected into the muscles involved in urination. Others come in pill form. Medications sometimes prescribed include:  Anticholinergics. These drugs block the signals that the nerves deliver to the bladder. This keeps it from releasing urine at the wrong time. Researchers think the drugs might help in other ways, too.  Imipramine. This is an antidepressant. But, it relaxes bladder muscles.  Botox. This is still experimental. Some people believe that injecting it into the bladder muscles will relax them so they work more normally. It has also been injected into the sphincter muscle when the sphincter muscle does not open properly. This is a temporary fix, however. Also, it might make matters worse, especially in older people.  Surgery.  A device might be implanted to help manage your nerves. It works on the nerves that signal when you need to urinate.  Surgery is sometimes needed with electrical stimulation. If the electrodes are implanted, this is done through surgery.  Sometimes repairs need to be made through surgery. For example, the size of the bladder can be changed. This is usually done in severe cases only. HOME CARE INSTRUCTIONS   Take any medications your healthcare provider prescribed or suggested. Follow the directions carefully.  Practice any lifestyle changes that are recommended. These might include:  Drinking less fluid or drinking at different times of the day. If you need to urinate often during the night, for example, you may need to stop drinking fluids early in the evening.  Cutting down on caffeine or alcohol. They can both make an overactive bladder worse. Caffeine is found in coffee, tea and  sodas.  Doing Kegel exercises to strengthen muscles.  Losing weight, if that is recommended.  Eating a healthy and balanced diet. This will help you avoid constipation.  Keep a journal or a log. You might be asked to record how much you drink and when, and also when you feel the need to urinate.  Learn how to care for implants or other devices, such as pessaries. SEEK MEDICAL CARE IF:   Your overactive bladder gets worse.  You feel increased pain or irritation when you urinate.  You notice blood in your urine.  You have questions about any medications or devices that your healthcare provider recommended.  You notice blood, pus or swelling at the site of any test or treatment procedure.  You have an oral temperature above 102 F (38.9 C). SEEK IMMEDIATE MEDICAL CARE IF:  You have an oral temperature above 102 F (38.9 C), not controlled by medicine. Document Released: 08/18/2009 Document Revised: 01/14/2012 Document Reviewed: 08/18/2009 [employer] Patient Information 2014 [employer]  

## 2014-01-27 NOTE — Progress Notes (Signed)
Patient ID: Cassandra Walker, female   DOB: [birth year], 80 y.o.   MRN: [id number] Subjective:   Cassandra Walker is a 80 y.o. female who presents for Medicare Annual Wellness Visit and 3 month follow up on hypertension, prediabetes, hyperlipidemia, vitamin D def.  Date of last medicare wellness visit is unknown.   She has had increased nocturia up to 7 episodes each night. She had recent UTI lab NEG. She has tried Oxybutynin but increased fatigue and balance difficulty. She notes it also increased constipation.  She has also been using OTC antifungal x 2-3 weeks for thick yellow toenails without improvement.  Her blood pressure has been controlled at home, today their BP is BP: 132/64 mmHg She does workout. She denies chest pain, shortness of breath, dizziness.  She is on cholesterol medication and denies myalgias. Her cholesterol is at goal. The cholesterol last visit was:   Lab Results  Component Value Date   CHOL 154 01/27/2014   HDL 58 01/27/2014   LDLCALC 71 01/27/2014   TRIG 123 01/27/2014   CHOLHDL 2.7 01/27/2014   She has been working on diet and exercise for prediabetes, and denies foot ulcerations and visual disturbances. Last A1C in the office was:  Lab Results  Component Value Date   HGBA1C 6.5* 01/27/2014   Patient is on Vitamin D supplement.  Names of Other Physician/Practitioners you currently use: Patient Care Team: Unk Pinto, MD as PCP - General (Internal Medicine) Exie Parody as Physician Assistant (Optometry) Schuyler Amor, MD as Consulting Physician (Orthopedic Surgery) Deliah Goody, MD as Consulting Physician (Ophthalmology) Winfield Cunas., MD as Consulting Physician (Gastroenterology) Newt Minion, MD as Consulting Physician (Orthopedic Surgery) Tanda Rockers, MD as Consulting Physician (Pulmonary Disease) Kathee Delton, MD as Consulting Physician (Pulmonary Disease)  Medication Review Current Outpatient Prescriptions on File Prior  to Visit  Medication Sig Dispense Refill  . atenolol (TENORMIN) 25 MG tablet Take 1 tablet (25 mg total) by mouth 2 (two) times daily.      Marland Kitchen atorvastatin (LIPITOR) 10 MG tablet Take 5 mg by mouth every evening.       . Cholecalciferol (VITAMIN D3 HIGH POTENCY PO) Take 8,000 Units by mouth daily.      . fish oil-omega-3 fatty acids 1000 MG capsule Take 2 g by mouth daily.      Marland Kitchen levothyroxine (SYNTHROID, LEVOTHROID) 75 MCG tablet Take 75 mcg by mouth daily.        Marland Kitchen lisinopril (PRINIVIL,ZESTRIL) 20 MG tablet Take 1 tablet (20 mg total) by mouth daily.      . Magnesium 250 MG TABS Take by mouth 2 (two) times daily at 10 AM and 5 PM.      . meclizine (ANTIVERT) 25 MG tablet TAKE 1 TABLET BY MOUTH THREE TIMES DAILY AS NEEDED  90 tablet  1  . meloxicam (MOBIC) 15 MG tablet Take 1 tablet (15 mg total) by mouth daily. For Pain and inflammation  90 tablet  99  . Rivaroxaban (XARELTO) 20 MG TABS tablet Take 1 tablet (20 mg total) by mouth daily.  30 tablet  3  . traMADol (ULTRAM) 50 MG tablet TAKE 1 TABLET BY MOUTH THREE TIMES DAILY AS NEEDED FOR PAIN  90 tablet  0   No current facility-administered medications on file prior to visit.    Current Problems (verified) Patient Active Problem List   Diagnosis Date Noted  . Neurogenic bladder 01/11/2014  . Tetraplegia 10/13/2013  .  Open wound of right heel 08/07/2013  . Pressure sore on heel 08/07/2013  . Constipation 06/29/2013  . Hypothyroidism 06/29/2013  . Anxiety 06/29/2013  . Dyslipidemia 06/29/2013  . Dupuytren's contracture of both hands 06/19/2013  . Left leg DVT 05/06/2013  . Low back pain 05/06/2013  . Vestibular dysfunction 04/09/2013  . UTI (urinary tract infection) due to citrobacter freundi 03/26/2013  . Leucocytosis 03/26/2013  . C7 cervical fracture 03/18/2013  . Urinary retention 03/17/2013  . Fracture of C5 vertebra, closed 03/10/2013  . C6 cervical fracture 03/10/2013  . Incomplete quadriplegia at C5-6 level 03/10/2013  .  HTN (hypertension) 03/10/2013  . Hyperglycemia 03/10/2013  . Pulmonary nodule 07/24/2011    Screening Tests Health Maintenance  Topic Date Due  . Colonoscopy  06/10/1984  . Zostavax  06/10/1994  . Tetanus/tdap  11/05/2006  . Influenza Vaccine  06/05/2014  . Pneumococcal Polysaccharide Vaccine Age 12 And Over  Completed     Immunization History  Administered Date(s) Administered  . Influenza Split 07/23/2013  . Pneumococcal Polysaccharide-23 06/27/2011  . Td 11/05/1996    Preventative care: Last colonoscopy: REFUSED Last mammogram: PER pt 2006 Last pap smear/pelvic exam: declines > 5 years DEXA: 2011osteoporosis  Prior vaccinations: TD or Tdap: 2013 at Hospital  Influenza: 07/23/13 Pneumococcal: 6948/ 2012 Shingles/Zostavax: declines due to cost  History reviewed:  Past Medical History  Diagnosis Date  . Osteoarthritis   . Hyperlipidemia   . GERD (gastroesophageal reflux disease)   . Peripheral neuropathy   . Osteopenia   . DDD (degenerative disc disease)   . Shingles   . Colon cancer 1977  . Thyroid cancer 1966  . Peripheral vascular disease   . Deep vein blood clot of left lower extremity 05/06/13   Past Surgical History  Procedure Laterality Date  . Thyroidectomy    . Total abdominal hysterectomy w/ bilateral salpingoophorectomy  1968  . Colon resection  1977  . Cervical laminectomy  1995  . Cataract extraction      bilateral  . Breast enhancement surgery      silicone  . Breast surgery    . Posterior cervical fusion/foraminotomy N/A 03/07/2013    Procedure: Cervical four -seven POSTERIOR CERVICAL FUSION;  Surgeon: Elaina Hoops, MD;  Location: [facility];  Service: Neurosurgery;  Laterality: N/A;   History  Substance Use Topics  . Smoking status: Former Smoker -- 1.00 packs/day for 20 years    Quit date: 11/05/1972  . Smokeless tobacco: Never Used  . Alcohol Use: No   Family History  Problem Relation Age of Onset  . Lung cancer Father       Risk Factors: Osteoporosis: postmenopausal estrogen deficiency History of fracture in the past year: no  Tobacco History  Substance Use Topics  . Smoking status: Former Smoker -- 1.00 packs/day for 20 years    Quit date: 11/05/1972  . Smokeless tobacco: Never Used  . Alcohol Use: No   She does not smoke.  Patient is a former smoker. Are there smokers in your home (other than you)?  No  Alcohol Current alcohol use: none  Caffeine Current caffeine use: coffee .5 /day  Exercise Exercise limitations: The patient is experiencing exercise intolerance (difficulty walking 10 feet on flat ground). Current exercise: gardening, housecleaning and no regular exercise  Nutrition/Diet Current diet: in general, a "healthy" diet    Cardiac risk factors: advanced age (older than 45 for men, 82 for women), hypertension and sedentary lifestyle.  Depression Screen Nurse depression screen  reviewed.  (Note: if answer to either of the following is "Yes", a more complete depression screening is indicated)   Q1: Over the past two weeks, have you felt down, depressed or hopeless? No  Q2: Over the past two weeks, have you felt little interest or pleasure in doing things? No  Have you lost interest or pleasure in daily life? No  Do you often feel hopeless? No  Do you cry easily over simple problems? No  Activities of Daily Living Nurse ADLs screen reviewed.  In your present state of health, do you have any difficulty performing the following activities?:  Driving? Yes Managing money?  Yes, Cassandra Walker takes care  Of it Feeding yourself? No Getting from bed to chair? No Climbing a flight of stairs? No Preparing food and eating?: Some foods, has aides 4 times a week Bathing or showering? No Getting dressed: No Getting to the toilet? No Using the toilet:No Moving around from place to place: Yes, uses walker, in OT/ PT In the past year have you fallen or had a near fall?:No   Are you  sexually active?  No  Do you have more than one partner?  No  Vision Difficulties: No  Hearing Difficulties: No Do you often ask people to speak up or repeat themselves? yes Do you experience ringing or noises in your ears? No Do you have difficulty understanding soft or whispered voices? Yes  Cognition  Do you feel that you have a problem with memory?Yes  Do you often misplace items? No  Do you feel safe at home?  Yes  Advanced directives Does patient have a [place]? Yes, Cassandra Walker Does patient have a Living Will? Yes    Objective:     Vision and hearing screens reviewed.   Blood pressure 132/64, pulse 76, temperature 98.6 F (37 C), temperature source Temporal, resp. rate 16, height 5' 5.25" (1.657 m), weight 135 lb (61.236 kg). Body mass index is 22.3 kg/(m^2).  General appearance: alert, no distress, WD/WN,  female Cognitive Testing  Alert? Yes  Normal Appearance?Yes  Oriented to person? Yes  Place? Yes   Time? Yes  Recall of three objects?  Yes  Can perform simple calculations? Yes  Displays appropriate judgment?Yes  Can read the correct time from a watch face?Yes  HEENT: normocephalic, sclerae anicteric, TMs pearly, nares patent, no discharge or erythema, pharynx normal Oral cavity: MMM, no lesions Neck: supple, no lymphadenopathy, no thyromegaly, no masses Heart: RRR, normal S1, S2, no murmurs Lungs: CTA bilaterally, no wheezes, rhonchi, or rales Abdomen: +bs, soft, non tender, non distended, no masses, no hepatomegaly, no splenomegaly Musculoskeletal: nontender, no swelling, no obvious deformity Extremities: no edema, no cyanosis, no clubbing Pulses: 2+ symmetric, upper and lower extremities, normal cap refill SKIN: Toe nails thick, yellow, peeling Neurological: alert, oriented x 3, CN2-12 intact, strength normal upper extremities and lower extremities, sensation normal throughout, DTRs 2+ throughout, no cerebellar signs, gait  normal Psychiatric: normal affect, behavior normal, pleasant  Breast: DEF Gyn: DEF  Assessment:  1.  3 month F/U for HTN, Cholesterol, Pre-Dm, D. Deficient. Needs healthy diet, cardio QD and obtain healthy weight. Check Labs, Check BP if >130/80 call office  2. medicare screening/ updateCPE- Update screening labs/ History/ Immunizations/ Testing as needed. Advised healthy diet, QD exercise, increase H20 and continue RX/ Vitamins AD.  3.OAB/ Constipation- Increase fiber/ water intake, decrease caffeine, increase activity level, can add Miralax or Metamucil QD until BM soft. Trial of Vesicare 5  mg 1 po qhs, call with results, SX# [id number]  4. Toe nail fungus- Try epsom salt soaks and Super glue coverage qod x 3 weeks and call with results. REF to Podiatry      Plan:   During the course of the visit the patient was educated and counseled about appropriate screening and preventive services including:    Diabetes screening  Nutrition counseling   Screening recommendations, referrals:  Vaccinations: Tdap vaccine Up to date Influenza vaccine Up to date Pneumococcal vaccine not done, Up to date Shingles vaccine not done Hep B vaccine no  Nutrition assessed and recommended  Colonoscopy no/ pt refusal Mammogram yes Pap smear No Pelvic exam no/ pt refusal Recommended yearly ophthalmology/optometry visit for glaucoma screening and checkup yes Recommended yearly dental visit for hygiene and checkup Advanced directives - yes  Conditions/risks identified: BMI: Discussed weight loss, diet, and increase physical activity.  Increase physical activity: AHA recommends 150 minutes of physical activity a week.  Medications reviewed DEXA- declined Diabetes is at goal, ACE/ARB therapy: Yes. Urinary Incontinence is an issue: discussed non pharmacology and pharmacology options.  Fall risk: moderate- discussed PT, home fall assessment, medications.   Medicare Attestation I have personally  reviewed: The patient's medical and social history Their use of alcohol, tobacco or illicit drugs Their current medications and supplements The patient's functional ability including ADLs,fall risks, home safety risks, cognitive, and hearing and visual impairment Diet and physical activities Evidence for depression or mood disorders  The patient's weight, height, BMI, and visual acuity have been recorded in the chart.  I have made referrals, counseling, and provided education to the patient based on review of the above and I have provided the patient with a written personalized care plan for preventive services.     Kelby Aline, R, PA-C   01/31/2014   CPT R8309 first AWV CPT [phone] subsequent AWV

## 2014-01-28 ENCOUNTER — Ambulatory Visit: Payer: Medicare FFS | Admitting: Physical Therapy

## 2014-01-28 ENCOUNTER — Ambulatory Visit: Payer: Medicare FFS | Admitting: Occupational Therapy

## 2014-01-28 LAB — LIPID PANEL
CHOL/HDL RATIO: 2.7 ratio
Cholesterol: 154 mg/dL (ref 0–200)
HDL: 58 mg/dL (ref >39)
LDL CALC: 71 mg/dL (ref 0–99)
Triglycerides: 123 mg/dL (ref <150)
VLDL: 25 mg/dL (ref 0–40)

## 2014-01-28 LAB — HEPATIC FUNCTION PANEL
ALK PHOS: 59 U/L (ref 39–117)
ALT: 13 U/L (ref 0–35)
AST: 16 U/L (ref 0–37)
Albumin: 4.1 g/dL (ref 3.5–5.2)
BILIRUBIN INDIRECT: 0.3 mg/dL (ref 0.2–1.2)
BILIRUBIN TOTAL: 0.4 mg/dL (ref 0.2–1.2)
Bilirubin, Direct: 0.1 mg/dL (ref 0.0–0.3)
Total Protein: 6.5 g/dL (ref 6.0–8.3)

## 2014-01-28 LAB — BASIC METABOLIC PANEL WITH GFR
BUN: 21 mg/dL (ref 6–23)
CALCIUM: 9.4 mg/dL (ref 8.4–10.5)
CO2: 32 mEq/L (ref 19–32)
Chloride: 101 mEq/L (ref 96–112)
Creat: 0.99 mg/dL (ref 0.50–1.10)
GFR, EST NON AFRICAN AMERICAN: 54 mL/min — AB
GFR, Est African American: 63 mL/min
Glucose, Bld: 81 mg/dL (ref 70–99)
Potassium: 4.9 mEq/L (ref 3.5–5.3)
SODIUM: 140 meq/L (ref 135–145)

## 2014-01-28 LAB — INSULIN, FASTING: INSULIN FASTING, SERUM: 10 u[IU]/mL (ref 3–28)

## 2014-02-01 ENCOUNTER — Ambulatory Visit: Payer: Medicare FFS | Admitting: Physical Therapy

## 2014-02-01 ENCOUNTER — Encounter: Payer: Medicare FFS | Admitting: Occupational Therapy

## 2014-02-04 ENCOUNTER — Encounter: Payer: Medicare FFS | Admitting: Occupational Therapy

## 2014-02-04 ENCOUNTER — Ambulatory Visit: Payer: Medicare FFS | Admitting: Physical Therapy

## 2014-02-08 ENCOUNTER — Other Ambulatory Visit: Payer: Self-pay | Admitting: Internal Medicine

## 2014-02-11 ENCOUNTER — Other Ambulatory Visit: Payer: Self-pay | Admitting: Emergency Medicine

## 2014-02-26 ENCOUNTER — Other Ambulatory Visit: Payer: Self-pay | Admitting: *Deleted

## 2014-02-26 MED ORDER — SOLIFENACIN SUCCINATE 5 MG PO TABS: 5.0000 mg | ORAL_TABLET | Freq: Every day | ORAL | Status: DC

## 2014-03-10 ENCOUNTER — Encounter: Payer: Self-pay | Admitting: Physical Medicine & Rehabilitation

## 2014-03-10 ENCOUNTER — Encounter: Payer: Medicare FFS | Attending: Physical Medicine & Rehabilitation | Admitting: Physical Medicine & Rehabilitation

## 2014-03-10 VITALS — BP 147/78 | HR 73 | Resp 14 | Ht 65.5 in | Wt 138.0 lb

## 2014-03-10 DIAGNOSIS — N319 Neuromuscular dysfunction of bladder, unspecified: Secondary | ICD-10-CM | POA: Insufficient documentation

## 2014-03-10 DIAGNOSIS — L8990 Pressure ulcer of unspecified site, unspecified stage: Secondary | ICD-10-CM

## 2014-03-10 DIAGNOSIS — G8254 Quadriplegia, C5-C7 incomplete: Secondary | ICD-10-CM

## 2014-03-10 DIAGNOSIS — X58XXXS Exposure to other specified factors, sequela: Secondary | ICD-10-CM | POA: Insufficient documentation

## 2014-03-10 DIAGNOSIS — M545 Low back pain, unspecified: Secondary | ICD-10-CM

## 2014-03-10 DIAGNOSIS — G8250 Quadriplegia, unspecified: Secondary | ICD-10-CM | POA: Insufficient documentation

## 2014-03-10 DIAGNOSIS — L89609 Pressure ulcer of unspecified heel, unspecified stage: Secondary | ICD-10-CM

## 2014-03-10 MED ORDER — SOLIFENACIN SUCCINATE 5 MG PO TABS: 5.0000 mg | ORAL_TABLET | Freq: Two times a day (BID) | ORAL | Status: DC

## 2014-03-10 NOTE — Patient Instructions (Addendum)
PLEASE CALL ME WITH ANY PROBLEMS OR QUESTIONS (#[phone]).     FLUID "RATIONING"

## 2014-03-10 NOTE — Progress Notes (Signed)
Subjective:    Patient ID: Cassandra Walker, female    DOB: 08/10/1932, 80 y.o.   MRN: [id number]  HPI  Cassandra Walker is back regarding her tetraplegia/ SCI. She had hand surgery last month and is involved in rehab. She has her splint on today.   Bladder has been problematic. She continues to have a lot of frequency especially in the evening. She was placed on vesicare because SE were too much (fatigue, dry mouth) with the ditropan. She is on 5mg  daily at present. She currently urinates 8-10 x per day. She has had her urine checked recently for infection also.  Pain levels in general have been better.   She has stopped PT for now given the OT she's doing. She would like to get back soon.   [name] would also like to drive soon     Pain Inventory Average Pain 6 Pain Right Now 0 My pain is n/a  In the last 24 hours, has pain interfered with the following? General activity 0 Relation with others 0 Enjoyment of life 0 What TIME of day is your pain at its worst? varies Sleep (in general) Good  Pain is worse with: some activites Pain improves with: n/a Relief from Meds: n/a  Mobility walk with assistance use a walker how many minutes can you walk? 10 do you drive?  no transfers alone Do you have any goals in this area?  yes  Function retired I need assistance with the following:  bathing, meal prep and household duties Do you have any goals in this area?  yes  Neuro/Psych bladder control problems dizziness  Prior Studies Any changes since last visit?  no  Physicians involved in your care Any changes since last visit?  no   Family History  Problem Relation Age of Onset  . Lung cancer Father    History   Social History  . Marital Status: Widowed    Spouse Name: N/A    Number of Children: 4  . Years of Education: N/A   Occupational History  . retired from [employer]  . Smoking status: Former Smoker -- 1.00 packs/day for 20  years    Quit date: 11/05/1972  . Smokeless tobacco: Never Used  . Alcohol Use: No  . Drug Use: None  . Sexual Activity: None   Other Topics Concern  . None   Social History Narrative  . None   Past Surgical History  Procedure Laterality Date  . Thyroidectomy    . Total abdominal hysterectomy w/ bilateral salpingoophorectomy  1968  . Colon resection  1977  . Cervical laminectomy  1995  . Cataract extraction      bilateral  . Breast enhancement surgery      silicone  . Breast surgery    . Posterior cervical fusion/foraminotomy N/A 03/07/2013    Procedure: Cervical four -seven POSTERIOR CERVICAL FUSION;  Surgeon: Elaina Hoops, MD;  Location: [facility];  Service: Neurosurgery;  Laterality: N/A;   Past Medical History  Diagnosis Date  . Osteoarthritis   . Hyperlipidemia   . GERD (gastroesophageal reflux disease)   . Peripheral neuropathy   . Osteopenia   . DDD (degenerative disc disease)   . Shingles   . Colon cancer 1977  . Thyroid cancer 1966  . Peripheral vascular disease   . Deep vein blood clot of left lower extremity 05/06/13   BP 147/78  Pulse 73  Resp 14  Ht  5' 5.5" (1.664 m)  Wt 138 lb (62.596 kg)  BMI 22.61 kg/m2  SpO2 98%  Opioid Risk Score:   Fall Risk Score: Moderate Fall Risk (6-13 points) (patient educated handout decline)   Review of Systems  Genitourinary: Positive for urgency and frequency.  Neurological: Positive for dizziness.  All other systems reviewed and are negative.      Objective:   Physical Exam  General: Alert and oriented x 3, No apparent distress. She is wearing her collar which is too loose.  HEENT: Head is normocephalic, atraumatic, PERRLA, EOMI, sclera anicteric, oral mucosa pink and moist, dentition intact, ext ear canals clear,  Neck: Supple without JVD or lymphadenopathy  Heart: Reg rate and rhythm. No murmurs rubs or gallops  Chest: CTA bilaterally without wheezes, rales, or rhonchi; no distress  Abdomen: Soft,  non-tender, non-distended, bowel sounds positive.  Extremities: No clubbing, 1+ edema only at feet.  Skin: wound is dressed. No odor.  Neuro: Pt is cognitively appropriate with improved insight, memory, and awareness. Cranial nerves 2-12 are intact. No nystagmus. Sensory exam is decreased slightly left more than right. Reflexes are 1+ in uppers, 2+ lowers. . Fine motor coordination is intact. No tremors. Motor function is 4/5 in UE prox to distal. She has decreased Dunbar in both hands underyling strength is still approximately 3+ to 4/5. RLE is 4/5 HF, KE and ankle 4+, LLE is 4-prox to 4/5 distally. She walked with improving gait.    Musculoskeletal: low back tender with palpation. Not necessarily more tender with bending or extending while seated. Hand with spling. Surgical wound healing nicely. Still with 40 deg flex contractures of middle digits. Psych: Pt's affect is appropriate. Pt is cooperative    Assessment & Plan:   1. Functional deficits secondary to C6-7 fx's with C6 SCI ASIA C, mild TBI  2. LLE DVT  3. Mild reactive mood decrease  4. Dupuytren's contractures of hands, with additional contracture related to her SCI  5. Right heel wound- closed  6. Neurogenic/overactive bladder.     Plan:  1. Reviewed HEP. Overall she continues to make great progress  2 Xarelto --9 months total 3. Resume neuro rehab for gait and strength training after she has done more with her hand. 4. Continue therapy/plan per ortho for hand  5. Increased vesicare to 5mg  BID (given increased pm symptoms). Made a referral to [facility].  6. Follow up with me in 3 months. 30 minutes of face to face patient care time were spent during this visit. All questions were encouraged and answered. Discussed the possbility of driving later this year.

## 2014-03-12 ENCOUNTER — Other Ambulatory Visit: Payer: Self-pay

## 2014-03-12 MED ORDER — SOLIFENACIN SUCCINATE 10 MG PO TABS: 10.0000 mg | ORAL_TABLET | Freq: Every day | ORAL | Status: DC

## 2014-03-12 NOTE — Telephone Encounter (Signed)
Patient's insurance denied paying for Vesicare 5 mg #60, so per Dr. Naaman Plummer changed Vesicare to 10 mg #30.

## 2014-03-20 ENCOUNTER — Other Ambulatory Visit: Payer: Self-pay | Admitting: Physician Assistant

## 2014-03-26 ENCOUNTER — Telehealth: Payer: Self-pay

## 2014-03-26 DIAGNOSIS — G8250 Quadriplegia, unspecified: Secondary | ICD-10-CM

## 2014-03-26 NOTE — Telephone Encounter (Signed)
Patient granddaughter called requesting new outpatient physical and occupational therapy order to [address], the other had expired.

## 2014-03-27 ENCOUNTER — Other Ambulatory Visit: Payer: Self-pay | Admitting: Emergency Medicine

## 2014-03-30 NOTE — Telephone Encounter (Signed)
Ok to reorder these or does hand surgery ortho need to order the OT?

## 2014-03-30 NOTE — Telephone Encounter (Signed)
Hand surgery should probably do the OT order as she is still rehabbing from surgery. She should check with them. I am happy to write both if needed.

## 2014-03-30 NOTE — Telephone Encounter (Signed)
i have ordered PT at neuro-rehab

## 2014-03-30 NOTE — Telephone Encounter (Signed)
Per patients granddaughter they want to resume the original physical therapy.  They stopped during surgery, so they just need to resume.

## 2014-04-16 ENCOUNTER — Other Ambulatory Visit: Payer: Self-pay | Admitting: Emergency Medicine

## 2014-04-19 ENCOUNTER — Ambulatory Visit: Payer: Medicare FFS | Admitting: Occupational Therapy

## 2014-04-19 ENCOUNTER — Ambulatory Visit: Payer: Medicare FFS | Admitting: Physical Therapy

## 2014-04-26 ENCOUNTER — Telehealth: Payer: Self-pay

## 2014-04-26 NOTE — Telephone Encounter (Signed)
Attempted to contact patient's granddaughter(Carmen). Left a voicemail to return call to clinic.

## 2014-04-26 NOTE — Telephone Encounter (Signed)
Patient's granddaughter would like a cal back in regards to some issues that the patient is having.

## 2014-04-29 ENCOUNTER — Encounter: Payer: Medicare HMO | Admitting: Internal Medicine

## 2014-04-29 DIAGNOSIS — Z79899 Other long term (current) drug therapy: Secondary | ICD-10-CM | POA: Insufficient documentation

## 2014-04-29 DIAGNOSIS — E559 Vitamin D deficiency, unspecified: Secondary | ICD-10-CM | POA: Insufficient documentation

## 2014-04-29 DIAGNOSIS — E119 Type 2 diabetes mellitus without complications: Secondary | ICD-10-CM | POA: Insufficient documentation

## 2014-04-29 NOTE — Progress Notes (Signed)
Patient ID: Cassandra Walker, female   DOB: 11-05-1932, 80 y.o.   MRN: [id number]    [name]

## 2014-05-14 ENCOUNTER — Other Ambulatory Visit: Payer: Self-pay | Admitting: *Deleted

## 2014-05-14 MED ORDER — ATORVASTATIN CALCIUM 10 MG PO TABS: 10.0000 mg | ORAL_TABLET | Freq: Every evening | ORAL | Status: DC

## 2014-05-15 ENCOUNTER — Other Ambulatory Visit: Payer: Self-pay | Admitting: Physician Assistant

## 2014-05-17 ENCOUNTER — Ambulatory Visit (INDEPENDENT_AMBULATORY_CARE_PROVIDER_SITE_OTHER): Payer: Medicare FFS | Admitting: Internal Medicine

## 2014-05-17 ENCOUNTER — Encounter: Payer: Self-pay | Admitting: Internal Medicine

## 2014-05-17 ENCOUNTER — Ambulatory Visit: Payer: Medicare FFS | Attending: Physical Medicine & Rehabilitation | Admitting: Physical Therapy

## 2014-05-17 ENCOUNTER — Ambulatory Visit: Payer: Medicare FFS | Attending: Orthopedic Surgery | Admitting: Occupational Therapy

## 2014-05-17 VITALS — BP 164/82 | HR 64 | Temp 97.5°F | Resp 16 | Ht 65.25 in | Wt 133.6 lb

## 2014-05-17 DIAGNOSIS — R279 Unspecified lack of coordination: Secondary | ICD-10-CM | POA: Diagnosis not present

## 2014-05-17 DIAGNOSIS — I10 Essential (primary) hypertension: Secondary | ICD-10-CM

## 2014-05-17 DIAGNOSIS — M6281 Muscle weakness (generalized): Secondary | ICD-10-CM | POA: Insufficient documentation

## 2014-05-17 DIAGNOSIS — E782 Mixed hyperlipidemia: Secondary | ICD-10-CM

## 2014-05-17 DIAGNOSIS — R269 Unspecified abnormalities of gait and mobility: Secondary | ICD-10-CM | POA: Insufficient documentation

## 2014-05-17 DIAGNOSIS — Z79899 Other long term (current) drug therapy: Secondary | ICD-10-CM

## 2014-05-17 DIAGNOSIS — IMO0001 Reserved for inherently not codable concepts without codable children: Secondary | ICD-10-CM | POA: Insufficient documentation

## 2014-05-17 DIAGNOSIS — M25649 Stiffness of unspecified hand, not elsewhere classified: Secondary | ICD-10-CM | POA: Insufficient documentation

## 2014-05-17 DIAGNOSIS — E1129 Type 2 diabetes mellitus with other diabetic kidney complication: Secondary | ICD-10-CM

## 2014-05-17 DIAGNOSIS — E559 Vitamin D deficiency, unspecified: Secondary | ICD-10-CM

## 2014-05-17 LAB — CBC WITH DIFFERENTIAL/PLATELET
Basophils Absolute: 0.1 10*3/uL (ref 0.0–0.1)
Basophils Relative: 1 % (ref 0–1)
EOS ABS: 1 10*3/uL — AB (ref 0.0–0.7)
EOS PCT: 10 % — AB (ref 0–5)
HCT: 36.1 % (ref 36.0–46.0)
HEMOGLOBIN: 12.1 g/dL (ref 12.0–15.0)
Lymphocytes Relative: 24 % (ref 12–46)
Lymphs Abs: 2.3 10*3/uL (ref 0.7–4.0)
MCH: 27.8 pg (ref 26.0–34.0)
MCHC: 33.5 g/dL (ref 30.0–36.0)
MCV: 82.8 fL (ref 78.0–100.0)
MONOS PCT: 9 % (ref 3–12)
Monocytes Absolute: 0.9 10*3/uL (ref 0.1–1.0)
Neutro Abs: 5.3 10*3/uL (ref 1.7–7.7)
Neutrophils Relative %: 56 % (ref 43–77)
Platelets: 326 10*3/uL (ref 150–400)
RBC: 4.36 MIL/uL (ref 3.87–5.11)
RDW: 13.8 % (ref 11.5–15.5)
WBC: 9.5 10*3/uL (ref 4.0–10.5)

## 2014-05-17 MED ORDER — ATENOLOL 50 MG PO TABS: 50.0000 mg | ORAL_TABLET | Freq: Every day | ORAL | Status: DC

## 2014-05-17 MED ORDER — ASPIRIN EC 81 MG PO TBEC: DELAYED_RELEASE_TABLET | ORAL | Status: DC

## 2014-05-17 NOTE — Patient Instructions (Addendum)
Stop Xarelto and start    Low dose coated Aspirin 81 mg x 2 tablets (=162 mg) daily ----------------------------------------------------------------------------------------- Recommend the book "The END of DIETING" by Dr Baker Janus   and the book "The END of DIABETES " by Dr Excell Seltzer  At [email or web address] - get book & Audio CD's   --------------------------------------------------------------------------------------    Being diabetic has a  300% increased risk for heart attack, stroke, cancer, and alzheimer- type vascular dementia. It is very important that you work harder with diet by avoiding all foods that are white except chicken & fish. Avoid white rice (brown & wild rice is OK), white potatoes (sweetpotatoes in moderation is OK), White bread or wheat bread or anything made out of white flour like bagels, donuts, rolls, buns, biscuits, cakes, pastries, cookies, pizza crust, and pasta (made from white flour & egg whites) - vegetarian pasta or spinach or wheat pasta is OK. Multigrain breads like Arnold's or Pepperidge [employer], or multigrain sandwich thins or flatbreads.  Diet, exercise and weight loss can reverse and cure diabetes in the early stages.  Diet, exercise and weight loss is very important in the control and prevention of complications of diabetes which affects every system in your body, ie. Brain - dementia/stroke, eyes - glaucoma/blindness, heart - heart attack/heart failure, kidneys - dialysis, stomach - gastric paralysis, intestines - malabsorption, nerves - severe painful neuritis, circulation - gangrene & loss of a leg(s), and finally cancer and Alzheimers.    I recommend avoid fried & greasy foods,  sweets/candy, white rice (brown or wild rice or Quinoa is OK), white potatoes (sweet potatoes are OK) - anything made from white flour - bagels, doughnuts, rolls, buns, biscuits,white and wheat breads, pizza crust and traditional pasta made of white flour & egg white(vegetarian pasta or  spinach or wheat pasta is OK).  Multi-grain bread is OK - like multi-grain flat bread or sandwich thins. Avoid alcohol in excess. Exercise is also important.    Eat all the vegetables you want - avoid meat, especially red meat and dairy - especially cheese.  Cheese is the most concentrated form of trans-fats which is the worst thing to clog up our arteries. Veggie cheese is OK which can be found in the fresh produce section at [facility] or [employer] or [employer]

## 2014-05-17 NOTE — Progress Notes (Signed)
Patient ID: Cassandra Walker, female   DOB: 1932-10-06, 80 y.o.   MRN: [id number]   This very nice 80 y.o.WWF presents for 3 month follow up with Hypertension, Hyperlipidemia, Pre-Diabetes and Vitamin D Deficiency. Patient has recently seen the Urologist for problems of incontinence & was intolerant of Oxybutynin & was then tried on Vesicare with a  500 ml residual  and that was likewise d/c'd.   Patient has a small 1 cm granulating stage 4 pressure type decubitus At the underside of the Lt 5th toe for which she's scheduled to be seen at the wound center.   HTN predates since 2002. BP has been controlled at home. Today's BP: 164/82 mmHg. Patient denies any cardiac type chest pain, palpitations, dyspnea/orthopnea/PND, dizziness, claudication, or dependent edema.    Also Patient had a LLE DVT in July 2014 following a Cervical spine Fx with resolving quadriparesis and has been on Xarelto since.    Hyperlipidemia is controlled with diet & meds. Patient denies myalgias or other med SE's. Last Lipids were at goal in Mar 2015 as below. Lab Results  Component Value Date   CHOL 154 01/27/2014   HDL 58 01/27/2014   LDLCALC 71 01/27/2014   TRIG 123 01/27/2014   CHOLHDL 2.7 01/27/2014    Also, the patient has history of PreDiabetes since April 2014 with A1c 6.0%  and last A1c was 6.5% in Mar 2015.  Patient denies any symptoms of reactive hypoglycemia, diabetic polys, paresthesias or visual blurring.   Further, Patient has history of Vitamin D Deficiency   and last vitamin D was 100 in Dec 2014. Patient supplements vitamin D without any suspected side-effects.   Medication List   atenolol 25 MG tablet  Commonly known as:  TENORMIN  Take 1 tablet (25 mg total) by mouth 2 (two) times daily.     atorvastatin 10 MG tablet  Commonly known as:  LIPITOR  Take 1 tablet (10 mg total) by mouth every evening.     fish oil-omega-3 fatty acids 1000 MG capsule  Take 2 g by mouth daily.     levothyroxine 75 MCG tablet   Commonly known as:  SYNTHROID, LEVOTHROID  Take 75 mcg by mouth daily.     lisinopril 20 MG tablet  Commonly known as:  PRINIVIL,ZESTRIL  Take 1 tablet (20 mg total) by mouth daily.     Magnesium 250 MG Tabs  Take by mouth 2 (two) times daily at 10 AM and 5 PM.     meclizine 25 MG tablet  Commonly known as:  ANTIVERT  TAKE 1 TABLET BY MOUTH THREE TIMES DAILY AS NEEDED     meloxicam 15 MG tablet  Commonly known as:  MOBIC  Take 1 tablet (15 mg total) by mouth daily. For Pain and inflammation     traMADol 50 MG tablet  Commonly known as:  ULTRAM  TAKE 1 TABLET BY MOUTH THREE TIMES DAILY AS NEEDED     VITAMIN D3 HIGH POTENCY PO  Take 8,000 Units by mouth daily.     XARELTO 20 MG Tabs tablet  Generic drug:  rivaroxaban  TAKE 1 TABLET BY MOUTH EVERY DAY FOR DVT       Allergies  Allergen Reactions  . Codeine Other (See Comments)    Jittery, nervous & hallucinations  . Oxybutynin     Tired   PMHx:   Past Medical History  Diagnosis Date  . Osteoarthritis   . Hyperlipidemia   . GERD (gastroesophageal reflux disease)   .  Peripheral neuropathy   . Osteopenia   . DDD (degenerative disc disease)   . Shingles   . Colon cancer 1977  . Thyroid cancer 1966  . Peripheral vascular disease   . Deep vein blood clot of left lower extremity 05/06/13   FHx:    Reviewed / unchanged  SHx:    Reviewed / unchanged  Systems Review:  Constitutional: Denies fever, chills, wt changes, headaches, insomnia, fatigue, night sweats, change in appetite. Eyes: Denies redness, blurred vision, diplopia, discharge, itchy, watery eyes.  ENT: Denies discharge, congestion, post nasal drip, epistaxis, sore throat, earache, hearing loss, dental pain, tinnitus, vertigo, sinus pain, snoring.  CV: Denies chest pain, palpitations, irregular heartbeat, syncope, dyspnea, diaphoresis, orthopnea, PND, claudication or edema. Respiratory: denies cough, dyspnea, DOE, pleurisy, hoarseness, laryngitis, wheezing.   Gastrointestinal: Denies dysphagia, odynophagia, heartburn, reflux, water brash, abdominal pain or cramps, nausea, vomiting, bloating, diarrhea, constipation, hematemesis, melena, hematochezia  or hemorrhoids. Genitourinary: Denies dysuria, frequency, nocturia, hesitancy, discharge, hematuria or flank pain. (+) urgency & incontinence.. Musculoskeletal: Denies  myalgias, stiffness, jt. swelling, limping or strain/sprain. Continues to c/o back pains controlled on Ultram. Skin: Denies pruritus, rash, hives, warts, acne, eczema or change in skin lesion(s). Neuro: No weakness, tremor, incoordination, spasms, paresthesia or pain. Psychiatric: Denies confusion, memory loss or sensory loss. Endo: Denies change in weight, skin or hair change.  Heme/Lymph: No excessive bleeding, bruising or enlarged lymph nodes.  Exam:  BP 164/82  Pulse 64  Temp(Src) 97.5 F (36.4 C) (Temporal)  Resp 16  Ht 5' 5.25" (1.657 m)  Wt 133 lb 9.6 oz (60.601 kg)  BMI 22.07 kg/m2  Appears well nourished and in no distress. Eyes: PERRLA, EOMs, conjunctiva no swelling or erythema. Sinuses: No frontal/maxillary tenderness ENT/Mouth: EAC's clear, TM's nl w/o erythema, bulging. Nares clear w/o erythema, swelling, exudates. Oropharynx clear without erythema or exudates. Oral hygiene is good. Tongue normal, non obstructing. Hearing intact.  Neck: Supple. Thyroid nl. Car 2+/2+ without bruits, nodes or JVD. Chest: Respirations nl with BS clear & equal w/o rales, rhonchi, wheezing or stridor.  Cor: Heart sounds normal w/ regular rate and rhythm without sig. murmurs, gallops, clicks, or rubs. Peripheral pulses normal and equal  without edema.  Abdomen: Soft & bowel sounds normal. Non-tender w/o guarding, rebound, hernias, masses, or organomegaly.  Lymphatics: Unremarkable.  Musculoskeletal: Full ROM all peripheral extremities, joint stability, 5/5 strength, and normal gait.  Skin: Warm, dry without exposed rashes, lesions or  ecchymosis apparent.  Neuro: Cranial nerves intact, reflexes equal bilaterally. Sensory-motor testing grossly intact. Tendon reflexes grossly intact.  Pysch: Alert & oriented x 3. Insight and judgement nl & appropriate. No ideations.  Assessment and Plan:  1. Hypertension - Continue monitor blood pressure at home. Continue diet/meds same.  2. Hyperlipidemia - Continue diet/meds, exercise,& lifestyle modifications. Continue monitor periodic cholesterol/liver & renal functions   3. T2_NIDDM w/Stage 3 CKD - continue recommend prudent low glycemic diet, weight control, regular exercise, diabetic monitoring and periodic eye exams.  4. Vitamin D Deficiency - Continue supplementation.  5. Lt Leg DVT  (July 2014)- Advised OK to d/c Xarelto and switch to bASA 81 mg x 2 tabs daily.  6. Occas. Incontinence - Sx's Myrbetriq to try.  Recommended regular exercise, BP monitoring, weight control, and discussed med and SE's. Recommended labs to assess and monitor clinical status. Further disposition pending results of labs.

## 2014-05-18 LAB — BASIC METABOLIC PANEL WITH GFR
BUN: 30 mg/dL — AB (ref 6–23)
CO2: 31 mEq/L (ref 19–32)
Calcium: 9.2 mg/dL (ref 8.4–10.5)
Chloride: 105 mEq/L (ref 96–112)
Creat: 1.19 mg/dL — ABNORMAL HIGH (ref 0.50–1.10)
GFR, Est African American: 50 mL/min — ABNORMAL LOW
GFR, Est Non African American: 44 mL/min — ABNORMAL LOW
GLUCOSE: 80 mg/dL (ref 70–99)
Potassium: 5 mEq/L (ref 3.5–5.3)
Sodium: 142 mEq/L (ref 135–145)

## 2014-05-18 LAB — LIPID PANEL
Cholesterol: 151 mg/dL (ref 0–200)
HDL: 59 mg/dL (ref >39)
LDL Cholesterol: 72 mg/dL (ref 0–99)
Total CHOL/HDL Ratio: 2.6 Ratio
Triglycerides: 102 mg/dL (ref <150)
VLDL: 20 mg/dL (ref 0–40)

## 2014-05-18 LAB — HEPATIC FUNCTION PANEL
ALT: 14 U/L (ref 0–35)
AST: 17 U/L (ref 0–37)
Albumin: 3.9 g/dL (ref 3.5–5.2)
Alkaline Phosphatase: 54 U/L (ref 39–117)
BILIRUBIN INDIRECT: 0.2 mg/dL (ref 0.2–1.2)
Bilirubin, Direct: 0.1 mg/dL (ref 0.0–0.3)
TOTAL PROTEIN: 6.2 g/dL (ref 6.0–8.3)
Total Bilirubin: 0.3 mg/dL (ref 0.2–1.2)

## 2014-05-18 LAB — HEMOGLOBIN A1C
HEMOGLOBIN A1C: 6.1 % — AB (ref <5.7)
MEAN PLASMA GLUCOSE: 128 mg/dL — AB (ref <117)

## 2014-05-18 LAB — INSULIN, FASTING: INSULIN FASTING, SERUM: 7 u[IU]/mL (ref 3–28)

## 2014-05-18 LAB — VITAMIN D 25 HYDROXY (VIT D DEFICIENCY, FRACTURES): VIT D 25 HYDROXY: 102 ng/mL — AB (ref 30–89)

## 2014-05-18 LAB — TSH: TSH: 3.331 u[IU]/mL (ref 0.350–4.500)

## 2014-05-18 LAB — MAGNESIUM: MAGNESIUM: 2 mg/dL (ref 1.5–2.5)

## 2014-05-20 ENCOUNTER — Encounter (HOSPITAL_BASED_OUTPATIENT_CLINIC_OR_DEPARTMENT_OTHER): Payer: Medicare FFS | Attending: Internal Medicine

## 2014-05-20 DIAGNOSIS — L97509 Non-pressure chronic ulcer of other part of unspecified foot with unspecified severity: Secondary | ICD-10-CM | POA: Insufficient documentation

## 2014-05-20 DIAGNOSIS — G5790 Unspecified mononeuropathy of unspecified lower limb: Secondary | ICD-10-CM | POA: Insufficient documentation

## 2014-05-21 NOTE — Progress Notes (Signed)
[facility]  NAME:  Cassandra Walker, Cassandra Walker NO.:  [id number]  MEDICAL RECORD NO.:  [id number]      DATE OF BIRTH:  07-18-1932  PHYSICIAN:  Ricard Dillon, M.D.      VISIT DATE:                                  OFFICE VISIT   LOCATION:  [facility].  CHIEF COMPLAINT:  Review of wound on her plantar left toe.  Cassandra Walker is a lady that we had in this program in the fall of 2014 at which time, she had a stage III pressure wound on her right heel. Arterial studies during that evaluation on September 10, 2013, showed the right ankle-brachial index at 1.01, on the left at 1.13.  She had triphasic waves on the left and right.  She had a toe break heel pressure index of 0.62, all of this indicating no evidence of significant peripheral or distal vascular disease.  On this occasion, she arrives with a wound on her the plantar aspect of her left great toe.  Again, this has been present for 3 weeks and really was of insidious onset.  The only issue I was able to ascertain from the patient is that she walks barefoot in her own home.  She also has hammer toe deformities, especially in the involved toe.  PAST MEDICAL HISTORY: 1. Hypertension. 2. Hypothyroidism. 3. Breast CA remotely in the 1970s. 4. Bilateral lower extremity DVTs. 5. Osteoarthritis. 6. Sinus problems. 7. Probably an idiopathic peripheral neuropathy.  She is not a     diabetic.  MEDICATIONS: 1. Atenolol 25 b.i.d. 2. Lipitor 5 daily. 3. Vitamin D3 daily. 4. Synthroid 75 daily. 5. Lisinopril 20 daily. 6. Magnesium 250 b.i.d. 7. Meclizine 25 two times a day p.r.n. 8. Meloxicam 15 mg daily. 9. Omega-3 fish oil 2 g daily. 10.Tramadol 50 three times daily. 11.Aspirin 160 daily.  PHYSICAL EXAMINATION:  VITAL SIGNS:  Temperature is 98.9, pulse 70, respirations 18, blood pressure is 148/72. EXTREMITIES:  The area in question is on the plantar aspect of her left great  toe, which as stated is a hammertoe.  This measures 0.4 x 0.4 x 0.2.  The wound was debrided using a #15 blade.  This removed the callus eschar.  This cleaned up quite nicely.  IMPRESSION:  Neuropathic wound on the left plantar toe.  We applied Collagen, Hydrogel, Kerlix, and a toe sock.  She will be put in a healing sandal.  I emphasized to walk in the home in a shoe protecting the toe.  We will see her again in a week's time.          ______________________________ Ricard Dillon, M.D.     MGR/MEDQ  D:  05/20/2014  T:  05/21/2014  Job:  [id number]

## 2014-05-24 ENCOUNTER — Ambulatory Visit: Payer: Medicare FFS | Admitting: Occupational Therapy

## 2014-05-24 ENCOUNTER — Ambulatory Visit: Payer: Medicare FFS | Admitting: Physical Therapy

## 2014-05-24 DIAGNOSIS — IMO0001 Reserved for inherently not codable concepts without codable children: Secondary | ICD-10-CM | POA: Diagnosis not present

## 2014-05-27 ENCOUNTER — Encounter (HOSPITAL_COMMUNITY): Payer: Self-pay | Admitting: Emergency Medicine

## 2014-05-27 ENCOUNTER — Emergency Department (HOSPITAL_COMMUNITY)
Admission: EM | Admit: 2014-05-27 | Discharge: 2014-05-27 | Disposition: A | Discharge status: Home / Self Care | Payer: Medicare FFS | Attending: Emergency Medicine | Admitting: Emergency Medicine

## 2014-05-27 ENCOUNTER — Emergency Department (HOSPITAL_COMMUNITY): Payer: Medicare FFS

## 2014-05-27 ENCOUNTER — Encounter: Payer: Self-pay | Admitting: Occupational Therapy

## 2014-05-27 ENCOUNTER — Ambulatory Visit: Payer: Self-pay | Admitting: Physical Therapy

## 2014-05-27 DIAGNOSIS — Z7982 Long term (current) use of aspirin: Secondary | ICD-10-CM | POA: Diagnosis not present

## 2014-05-27 DIAGNOSIS — D72829 Elevated white blood cell count, unspecified: Secondary | ICD-10-CM | POA: Insufficient documentation

## 2014-05-27 DIAGNOSIS — R5383 Other fatigue: Secondary | ICD-10-CM | POA: Diagnosis not present

## 2014-05-27 DIAGNOSIS — Z8619 Personal history of other infectious and parasitic diseases: Secondary | ICD-10-CM | POA: Insufficient documentation

## 2014-05-27 DIAGNOSIS — Z79899 Other long term (current) drug therapy: Secondary | ICD-10-CM | POA: Diagnosis not present

## 2014-05-27 DIAGNOSIS — R110 Nausea: Secondary | ICD-10-CM | POA: Insufficient documentation

## 2014-05-27 DIAGNOSIS — K219 Gastro-esophageal reflux disease without esophagitis: Secondary | ICD-10-CM | POA: Insufficient documentation

## 2014-05-27 DIAGNOSIS — C3490 Malignant neoplasm of unspecified part of unspecified bronchus or lung: Secondary | ICD-10-CM | POA: Diagnosis not present

## 2014-05-27 DIAGNOSIS — Z87891 Personal history of nicotine dependence: Secondary | ICD-10-CM | POA: Insufficient documentation

## 2014-05-27 DIAGNOSIS — R51 Headache: Secondary | ICD-10-CM | POA: Insufficient documentation

## 2014-05-27 DIAGNOSIS — R5381 Other malaise: Secondary | ICD-10-CM | POA: Diagnosis present

## 2014-05-27 DIAGNOSIS — Z792 Long term (current) use of antibiotics: Secondary | ICD-10-CM | POA: Diagnosis not present

## 2014-05-27 DIAGNOSIS — Z791 Long term (current) use of non-steroidal anti-inflammatories (NSAID): Secondary | ICD-10-CM | POA: Diagnosis not present

## 2014-05-27 DIAGNOSIS — Z86718 Personal history of other venous thrombosis and embolism: Secondary | ICD-10-CM | POA: Insufficient documentation

## 2014-05-27 DIAGNOSIS — E785 Hyperlipidemia, unspecified: Secondary | ICD-10-CM | POA: Diagnosis not present

## 2014-05-27 DIAGNOSIS — M1990 Unspecified osteoarthritis, unspecified site: Secondary | ICD-10-CM | POA: Diagnosis not present

## 2014-05-27 DIAGNOSIS — Z85038 Personal history of other malignant neoplasm of large intestine: Secondary | ICD-10-CM | POA: Insufficient documentation

## 2014-05-27 DIAGNOSIS — Z8679 Personal history of other diseases of the circulatory system: Secondary | ICD-10-CM | POA: Insufficient documentation

## 2014-05-27 DIAGNOSIS — Z85850 Personal history of malignant neoplasm of thyroid: Secondary | ICD-10-CM | POA: Insufficient documentation

## 2014-05-27 DIAGNOSIS — D491 Neoplasm of unspecified behavior of respiratory system: Secondary | ICD-10-CM

## 2014-05-27 LAB — CBC
HCT: 37.1 % (ref 36.0–46.0)
HEMOGLOBIN: 12.3 g/dL (ref 12.0–15.0)
MCH: 28.3 pg (ref 26.0–34.0)
MCHC: 33.2 g/dL (ref 30.0–36.0)
MCV: 85.5 fL (ref 78.0–100.0)
Platelets: 263 10*3/uL (ref 150–400)
RBC: 4.34 MIL/uL (ref 3.87–5.11)
RDW: 13.4 % (ref 11.5–15.5)
WBC: 23.7 10*3/uL — ABNORMAL HIGH (ref 4.0–10.5)

## 2014-05-27 LAB — TROPONIN I: Troponin I: <0.3 ng/mL (ref <0.30)

## 2014-05-27 LAB — I-STAT CHEM 8, ED
BUN: 28 mg/dL — ABNORMAL HIGH (ref 6–23)
CALCIUM ION: 1.13 mmol/L (ref 1.13–1.30)
Chloride: 98 mEq/L (ref 96–112)
Creatinine, Ser: 1 mg/dL (ref 0.50–1.10)
GLUCOSE: 130 mg/dL — AB (ref 70–99)
HEMATOCRIT: 39 % (ref 36.0–46.0)
HEMOGLOBIN: 13.3 g/dL (ref 12.0–15.0)
POTASSIUM: 4.1 meq/L (ref 3.7–5.3)
Sodium: 136 mEq/L — ABNORMAL LOW (ref 137–147)
TCO2: 29 mmol/L (ref 0–100)

## 2014-05-27 LAB — URINALYSIS, ROUTINE W REFLEX MICROSCOPIC
BILIRUBIN URINE: NEGATIVE
Glucose, UA: NEGATIVE mg/dL
Hgb urine dipstick: NEGATIVE
KETONES UR: NEGATIVE mg/dL
LEUKOCYTES UA: NEGATIVE
Nitrite: NEGATIVE
PH: 7.5 (ref 5.0–8.0)
Protein, ur: 30 mg/dL — AB
SPECIFIC GRAVITY, URINE: 1.018 (ref 1.005–1.030)
UROBILINOGEN UA: 0.2 mg/dL (ref 0.0–1.0)

## 2014-05-27 LAB — COMPREHENSIVE METABOLIC PANEL
ALBUMIN: 3.2 g/dL — AB (ref 3.5–5.2)
ALT: 19 U/L (ref 0–35)
ANION GAP: 12 (ref 5–15)
AST: 23 U/L (ref 0–37)
Alkaline Phosphatase: 68 U/L (ref 39–117)
BILIRUBIN TOTAL: 0.4 mg/dL (ref 0.3–1.2)
BUN: 25 mg/dL — ABNORMAL HIGH (ref 6–23)
CALCIUM: 8.9 mg/dL (ref 8.4–10.5)
CHLORIDE: 99 meq/L (ref 96–112)
CO2: 28 mEq/L (ref 19–32)
CREATININE: 0.96 mg/dL (ref 0.50–1.10)
GFR calc Af Amer: 64 mL/min — ABNORMAL LOW (ref >90)
GFR, EST NON AFRICAN AMERICAN: 55 mL/min — AB (ref >90)
Glucose, Bld: 126 mg/dL — ABNORMAL HIGH (ref 70–99)
Potassium: 4.3 mEq/L (ref 3.7–5.3)
Sodium: 139 mEq/L (ref 137–147)
Total Protein: 7 g/dL (ref 6.0–8.3)

## 2014-05-27 LAB — URINE MICROSCOPIC-ADD ON

## 2014-05-27 LAB — I-STAT CG4 LACTIC ACID, ED: LACTIC ACID, VENOUS: 1.13 mmol/L (ref 0.5–2.2)

## 2014-05-27 MED ORDER — LEVOFLOXACIN 500 MG PO TABS: 500.0000 mg | ORAL_TABLET | Freq: Every day | ORAL | Status: DC

## 2014-05-27 MED ORDER — ONDANSETRON 4 MG PO TBDP: 4.0000 mg | ORAL_TABLET | Freq: Three times a day (TID) | ORAL | Status: DC | PRN

## 2014-05-27 MED ORDER — DEXTROSE 5 % IV SOLN
1.0000 g | Freq: Once | INTRAVENOUS | Status: AC
  Administered 2014-05-27: 1 g via INTRAVENOUS
  Filled 2014-05-27: qty 10

## 2014-05-27 MED ORDER — IOHEXOL 300 MG/ML  SOLN
100.0000 mL | Freq: Once | INTRAMUSCULAR | Status: AC | PRN
  Administered 2014-05-27: 80 mL via INTRAVENOUS

## 2014-05-27 MED ORDER — DEXTROSE 5 % IV SOLN
500.0000 mg | Freq: Once | INTRAVENOUS | Status: AC
  Administered 2014-05-27: 500 mg via INTRAVENOUS
  Filled 2014-05-27: qty 500

## 2014-05-27 MED ORDER — SODIUM CHLORIDE 0.9 % IV SOLN
Freq: Once | INTRAVENOUS | Status: AC
  Administered 2014-05-27: 14:00:00 via INTRAVENOUS

## 2014-05-27 MED ORDER — ONDANSETRON HCL 4 MG/2ML IJ SOLN
4.0000 mg | Freq: Once | INTRAMUSCULAR | Status: AC
  Administered 2014-05-27: 4 mg via INTRAVENOUS
  Filled 2014-05-27: qty 2

## 2014-05-27 MED ORDER — SODIUM CHLORIDE 0.9 % IV BOLUS (SEPSIS)
1000.0000 mL | Freq: Once | INTRAVENOUS | Status: AC
  Administered 2014-05-27: 1000 mL via INTRAVENOUS

## 2014-05-27 NOTE — ED Notes (Signed)
Pt at xray

## 2014-05-27 NOTE — ED Provider Notes (Signed)
CSN: [id number]     Arrival date & time 05/27/14  1059 History   First MD Initiated Contact with Patient 05/27/14 1105     Chief Complaint  Patient presents with  . Fatigue      HPI  Patient presents after a two-day illness. Felt a little weak during the day yesterday but last night at dinner and going to bed felt well. She awakened during the night with a mild headache and nausea. Headache is resolved now. Feels generalized weakness. On Tuesday states that she "almost fainted". Does not recall that now but told her daughter about it. Granddaughter states that she saw her primary care physician 2 weeks ago and was told that she was "dehydrated" based on some lab testing. Patient admits she has really not been able to increase her by mouth intake.  She denies cough or shortness of breath. She denied denies dysuria. He has had difficulty with occasional stress incontinence. Had urinary retention on a trial of Vesicare. No irritant voiding symptoms recently. Normal urine output this morning. No diarrhea. No joint pain.  Past Medical History  Diagnosis Date  . Osteoarthritis   . Hyperlipidemia   . GERD (gastroesophageal reflux disease)   . Peripheral neuropathy   . Osteopenia   . DDD (degenerative disc disease)   . Shingles   . Colon cancer 1977  . Thyroid cancer 1966  . Peripheral vascular disease   . Deep vein blood clot of left lower extremity 05/06/13   Past Surgical History  Procedure Laterality Date  . Thyroidectomy    . Total abdominal hysterectomy w/ bilateral salpingoophorectomy  1968  . Colon resection  1977  . Cervical laminectomy  1995  . Cataract extraction      bilateral  . Breast enhancement surgery      silicone  . Breast surgery    . Posterior cervical fusion/foraminotomy N/A 03/07/2013    Procedure: Cervical four -seven POSTERIOR CERVICAL FUSION;  Surgeon: Elaina Hoops, MD;  Location: [facility];  Service: Neurosurgery;  Laterality: N/A;   Family History   Problem Relation Age of Onset  . Lung cancer Father    History  Substance Use Topics  . Smoking status: Former Smoker -- 1.00 packs/day for 20 years    Quit date: 11/05/1972  . Smokeless tobacco: Never Used  . Alcohol Use: No   OB History   Grav Para Term Preterm Abortions TAB SAB Ect Mult Living                 Review of Systems  Constitutional: Positive for fatigue. Negative for fever, chills, diaphoresis and appetite change.  HENT: Negative for mouth sores, sore throat and trouble swallowing.   Eyes: Negative for visual disturbance.  Respiratory: Negative for cough, chest tightness, shortness of breath and wheezing.   Cardiovascular: Negative for chest pain.  Gastrointestinal: Positive for nausea. Negative for vomiting, abdominal pain, diarrhea and abdominal distention.  Endocrine: Negative for polydipsia, polyphagia and polyuria.  Genitourinary: Negative for dysuria, frequency and hematuria.  Musculoskeletal: Negative for gait problem.  Skin: Negative for color change, pallor and rash.  Neurological: Positive for weakness, light-headedness and headaches. Negative for dizziness and syncope.  Hematological: Does not bruise/bleed easily.  Psychiatric/Behavioral: Negative for behavioral problems and confusion.      Allergies  Codeine and Oxybutynin  Home Medications   Prior to Admission medications   Medication Sig Start Date End Date Taking? Authorizing Provider  aspirin EC 81 MG tablet Take 162  mg by mouth daily.   Yes Historical Provider, MD  atenolol (TENORMIN) 50 MG tablet Take 1 tablet (50 mg total) by mouth daily. For BP 05/17/14 05/18/15 Yes Unk Pinto, MD  atorvastatin (LIPITOR) 10 MG tablet Take 5 mg by mouth at bedtime.   Yes Historical Provider, MD  Cholecalciferol (VITAMIN D3 HIGH POTENCY PO) Take 2,000-5,000 Units by mouth 2 (two) times daily. Take 5000 units in the morning and take 2000 units in the evening.   Yes Historical Provider, MD  fish  oil-omega-3 fatty acids 1000 MG capsule Take 1 g by mouth daily.    Yes Historical Provider, MD  levothyroxine (SYNTHROID, LEVOTHROID) 75 MCG tablet Take 75 mcg by mouth daily.     Yes Historical Provider, MD  lisinopril (PRINIVIL,ZESTRIL) 20 MG tablet Take 1 tablet (20 mg total) by mouth daily. 04/10/13  Yes Ivan Anchors Love, PA-C  loratadine (CLARITIN) 10 MG tablet Take 10 mg by mouth daily as needed for allergies.   Yes Historical Provider, MD  Magnesium 250 MG TABS Take 250 mg by mouth 2 (two) times daily.    Yes Historical Provider, MD  meclizine (ANTIVERT) 25 MG tablet Take 25 mg by mouth 2 (two) times daily as needed for dizziness.    Yes Historical Provider, MD  meloxicam (MOBIC) 15 MG tablet Take 1 tablet (15 mg total) by mouth daily. For Pain and inflammation 11/26/13  Yes Unk Pinto, MD  naproxen sodium (ANAPROX) 220 MG tablet Take 440 mg by mouth daily as needed (pain).   Yes Historical Provider, MD  traMADol (ULTRAM) 50 MG tablet Take 50 mg by mouth 3 (three) times daily as needed for moderate pain.   Yes Historical Provider, MD  levofloxacin (LEVAQUIN) 500 MG tablet Take 1 tablet (500 mg total) by mouth daily. 05/27/14   Tanna Furry, MD  ondansetron (ZOFRAN ODT) 4 MG disintegrating tablet Take 1 tablet (4 mg total) by mouth every 8 (eight) hours as needed for nausea. 05/27/14   Tanna Furry, MD   BP 159/68  Pulse 80  Temp(Src) 97.9 F (36.6 C) (Oral)  Resp 12  Ht 5' 5.5" (1.664 m)  Wt 133 lb (60.328 kg)  BMI 21.79 kg/m2  SpO2 99% Physical Exam  Constitutional: She is oriented to person, place, and time. She appears well-developed and well-nourished. No distress.  HENT:  Head: Normocephalic.  No nystagmus. Dry mucous membranes the mouth.  Eyes: Conjunctivae are normal. Pupils are equal, round, and reactive to light. No scleral icterus.  Neck: Normal range of motion. Neck supple. No thyromegaly present.  Cardiovascular: Normal rate and regular rhythm.  Exam reveals no gallop and no  friction rub.   No murmur heard. Pulmonary/Chest: Effort normal and breath sounds normal. No respiratory distress. She has no wheezes. She has no rales.  Clear lungs. Not dyspneic or tachypneic  Abdominal: Soft. Bowel sounds are normal. She exhibits no distension. There is no tenderness. There is no rebound.  Musculoskeletal: Normal range of motion.  Neurological: She is alert and oriented to person, place, and time.  Skin: Skin is warm and dry. No rash noted.  Psychiatric: She has a normal mood and affect. Her behavior is normal.    ED Course  Procedures (including critical care time) Labs Review Labs Reviewed  CBC - Abnormal; Notable for the following:    WBC 23.7 (*)    All other components within normal limits  COMPREHENSIVE METABOLIC PANEL - Abnormal; Notable for the following:    Glucose, Bld  126 (*)    BUN 25 (*)    Albumin 3.2 (*)    GFR calc non Af Amer 55 (*)    GFR calc Af Amer 64 (*)    All other components within normal limits  URINALYSIS, ROUTINE W REFLEX MICROSCOPIC - Abnormal; Notable for the following:    Protein, ur 30 (*)    All other components within normal limits  I-STAT CHEM 8, ED - Abnormal; Notable for the following:    Sodium 136 (*)    BUN 28 (*)    Glucose, Bld 130 (*)    All other components within normal limits  URINE CULTURE  CULTURE, BLOOD (ROUTINE X 2)  CULTURE, BLOOD (ROUTINE X 2)  TROPONIN I  URINE MICROSCOPIC-ADD ON  I-STAT CG4 LACTIC ACID, ED    Imaging Review Dg Chest 2 View  05/27/2014   CLINICAL DATA:  Fatigue and chest pain  EXAM: CHEST  2 VIEW  COMPARISON:  03/07/2013  FINDINGS: COPD with hyperinflation. Densely calcified breast implants overlie the lung bases and obscure this area.  Right hilar density has developed since the prior study. This may represent pneumonia or mass. Follow-up recommended. No effusion or heart failure.  IMPRESSION: COPD.  Prominent right hilum which may represent pneumonia or mass lesion. Followup chest  x-ray or chest CT suggested.   Electronically Signed   By: Franchot Gallo M.D.   On: 05/27/2014 12:53   Ct Chest W Contrast  05/27/2014   CLINICAL DATA:  Fever malaise. Rule out pneumonia. Abnormal chest x-ray  EXAM: CT CHEST WITH CONTRAST  TECHNIQUE: Multidetector CT imaging of the chest was performed during intravenous contrast administration.  CONTRAST:  44mL OMNIPAQUE IOHEXOL 300 MG/ML  SOLN  COMPARISON:  CT chest 07/02/2011  FINDINGS: Spiculated mass in the superior segment right lower lobe has progressed since the prior CT. The mass now measures 5.4 x 3.5 cm and is compatible with carcinoma. Right lower paratracheal lymph node measures 12 mm and has enlarged in the interval. 9 x 20 mm subcarinal lymph node has progressed slightly.  Several small lung nodules on the right have progressed. Subpleural nodules are present right upper lobe. Nodular density right medial lung base measures 11 mm and is new. These findings are suspicious for metastatic disease.  No pleural effusion.  Negative for pneumonia.  IMPRESSION: Spiculated mass superior segment right lower lobe has progressed since 2012 compatible with carcinoma. Several small lung nodules on the right are suspicious for metastatic disease. Mediastinal adenopathy suspicious for metastatic disease.   Electronically Signed   By: Franchot Gallo M.D.   On: 05/27/2014 14:40     EKG Interpretation   Date/Time:  Thursday May 27 2014 11:09:55 EDT Ventricular Rate:  77 PR Interval:  153 QRS Duration: 100 QT Interval:  406 QTC Calculation: 459 R Axis:   -56 Text Interpretation:  Sinus rhythm Incomplete RBBB and LAFB Abnormal  R-wave progression, early transition Confirmed by Jeneen Rinks  MD, Barclay ([id number])  on 05/27/2014 11:13:31 AM      MDM   Final diagnoses:  Other fatigue  Lung tumor  Leukocytosis  Nausea    No no concerning findings on initial exam. She is nauseated and having some dry heaves. Neurologically intact. Afebrile, well oxygenated.  Plan will be rehydration and antiemetics. Labs urine chest x-ray EKG troponin. Reevaluation.  15:41:  Patient's x-ray suggests infiltrate versus tumor. Head CT shows spiculated tumor. Of note the patient did have a BAL and bronchial biopsies that were  benign of the similar area in 2012. However, they're increasing areas of disease, an increase in size of the area. I will continue her on antibiotics. However, have asked her to see pulmonary and followup. Appointment originally made for Monday, 4 days from now. However, family states they will be out of town and unable to take her until a week from Monday. Appointment made August 3 with Dr. Melvyn Novas at pulmonary care at [facility].  Tanna Furry, MD 05/27/14 [phone]

## 2014-05-27 NOTE — ED Notes (Signed)
Per grand-daughter pt c/o generalized weakness & fatigue, nausea, mid-abdominal pain and headache. Pt in NAD. Endorses dizziness and lightheadedness that has started yesterday. Pt sts had a syncopal episode on Tuesday but denies any injuries. Pt sts she has had good PO intake but family sts is unsure. Pt is HOH.

## 2014-05-27 NOTE — Discharge Instructions (Signed)
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 The tumor was benign on your testing then, it has gotten bigger, and will need further evaluation. Appointment made with Dr. Melvyn Novas (Pulmonary) on Monday August 3rd, 2015, at 11:30am. Return to ER with worsening vomiting, fever, or other changes in your condition at home.  Fatigue Fatigue is a feeling of tiredness, lack of energy, lack of motivation, or feeling tired all the time. Having enough rest, good nutrition, and reducing stress will normally reduce fatigue. Consult your caregiver if it persists. The nature of your fatigue will help your caregiver to find out its cause. The treatment is based on the cause.  CAUSES  There are many causes for fatigue. Most of the time, fatigue can be traced to one or more of your habits or routines. Most causes fit into one or more of three general areas. They are: Lifestyle problems  Sleep disturbances.  Overwork.  Physical exertion.  Unhealthy habits.  Poor eating habits or eating disorders.  Alcohol and/or drug use .  Lack of proper nutrition (malnutrition). Psychological problems  Stress and/or anxiety problems.  Depression.  Grief.  Boredom. Medical Problems or Conditions  Anemia.  Pregnancy.  Thyroid gland problems.  Recovery from major surgery.  Continuous pain.  Emphysema or asthma that is not well controlled  Allergic conditions.  Diabetes.  Infections (such as mononucleosis).  Obesity.  Sleep disorders, such as sleep apnea.  Heart failure or other heart-related problems.  Cancer.  Kidney disease.  Liver disease.  Effects of certain medicines such as antihistamines, cough and cold remedies, prescription pain medicines, heart and blood pressure medicines, drugs used for treatment of cancer, and some antidepressants. SYMPTOMS  The symptoms of fatigue include:   Lack of energy.  Lack of drive (motivation).  Drowsiness.  Feeling of  indifference to the surroundings. DIAGNOSIS  The details of how you feel help guide your caregiver in finding out what is causing the fatigue. You will be asked about your present and past health condition. It is important to review all medicines that you take, including prescription and non-prescription items. A thorough exam will be done. You will be questioned about your feelings, habits, and normal lifestyle. Your caregiver may suggest blood tests, urine tests, or other tests to look for common medical causes of fatigue.  TREATMENT  Fatigue is treated by correcting the underlying cause. For example, if you have continuous pain or depression, treating these causes will improve how you feel. Similarly, adjusting the dose of certain medicines will help in reducing fatigue.  HOME CARE INSTRUCTIONS   Try to get the required amount of good sleep every night.  Eat a healthy and nutritious diet, and drink enough water throughout the day.  Practice ways of relaxing (including yoga or meditation).  Exercise regularly.  Make plans to change situations that cause stress. Act on those plans so that stresses decrease over time. Keep your work and personal routine reasonable.  Avoid street drugs and minimize use of alcohol.  Start taking a daily multivitamin after consulting your caregiver. SEEK MEDICAL CARE IF:   You have persistent tiredness, which cannot be accounted for.  You have fever.  You have unintentional weight loss.  You have headaches.  You have disturbed sleep throughout the night.  You are feeling sad.  You have constipation.  You have dry skin.  You have gained weight.  You are taking any new or different medicines that you suspect are causing  fatigue.  You are unable to sleep at night.  You develop any unusual swelling of your legs or other parts of your body. SEEK IMMEDIATE MEDICAL CARE IF:   You are feeling confused.  Your vision is blurred.  You feel faint  or pass out.  You develop severe headache.  You develop severe abdominal, pelvic, or back pain.  You develop chest pain, shortness of breath, or an irregular or fast heartbeat.  You are unable to pass a normal amount of urine.  You develop abnormal bleeding such as bleeding from the rectum or you vomit blood.  You have thoughts about harming yourself or committing suicide.  You are worried that you might harm someone else. MAKE SURE YOU:   Understand these instructions.  Will watch your condition.  Will get help right away if you are not doing well or get worse. Document Released: 08/19/2007 Document Revised: 01/14/2012 Document Reviewed: 02/23/2014 [facility] Patient Information 2015 [place], [place]. This information is not intended to replace advice given to you by your health care provider. Make sure you discuss any questions you have with your health care provider.  Leukocytosis Leukocytosis means you have more white blood cells than normal. White blood cells are made in your bone marrow. The main job of white blood cells is to fight infection. Having too many white blood cells is a common condition. It can develop as a result of many types of medical problems. CAUSES  In some cases, your bone marrow may be normal, but it is still making too many white blood cells. This could be the result of:  Infection.  Injury.  Physical stress.  Emotional stress.  Surgery.  Allergic reactions.  Tumors that do not start in the blood or bone marrow.  An inherited disease.  Certain medicines.  Pregnancy and labor. In other cases, you may have a bone marrow disorder that is causing your body to make too many white blood cells. Bone marrow disorders include:  Leukemia. This is a type of blood cancer.  Myeloproliferative disorders. These disorders cause blood cells to grow abnormally. SYMPTOMS  Some people have no symptoms. Others have symptoms due to the medical problem that is  causing their leukocytosis. These symptoms may include:  Bleeding.  Bruising.  Fever.  Night sweats.  Repeated infections.  Weakness.  Weight loss. DIAGNOSIS  Leukocytosis is often found during blood tests that are done as part of a normal physical exam. Your caregiver will probably order other tests to help determine why you have too many white blood cells. These tests may include:  A complete blood count (CBC). This test measures all the types of blood cells in your body.  Chest X-rays, urine tests (urinalysis), or other tests to look for signs of infection.  Bone marrow aspiration. For this test, a needle is put into your bone. Cells from the bone marrow are removed through the needle. The cells are then examined under a microscope. TREATMENT  Treatment is usually not needed for leukocytosis. However, if a disorder is causing your leukocytosis, it will need to be treated. Treatment may include:  Antibiotic medicines if you have a bacterial infection.  Bone marrow transplant. Your diseased bone marrow is replaced with healthy cells that will grow new bone marrow.  Chemotherapy. This is the use of drugs to kill cancer cells. HOME CARE INSTRUCTIONS  Only take over-the-counter or prescription medicines as directed by your caregiver.  Maintain a healthy weight. Ask your caregiver what weight is best  for you.  Eat foods that are low in saturated fats and high in fiber. Eat plenty of fruits and vegetables.  Drink enough fluids to keep your urine clear or pale yellow.  Get 30 minutes of exercise at least 5 times a week. Check with your caregiver before starting a new exercise routine.  Limit caffeine and alcohol.  Do not smoke.  Keep all follow-up appointments as directed by your caregiver. SEEK MEDICAL CARE IF:  You feel weak or more tired than usual.  You develop chills, a cough, or nasal congestion.  You lose weight without trying.  You have night sweats.  You  bruise easily. SEEK IMMEDIATE MEDICAL CARE IF:  You bleed more than normal.  You have chest pain.  You have trouble breathing.  You have a fever.  You have uncontrolled nausea or vomiting.  You feel dizzy or lightheaded. MAKE SURE YOU:  Understand these instructions.  Will watch your condition.  Will get help right away if you are not doing well or get worse. Document Released: 10/11/2011 Document Revised: 01/14/2012 Document Reviewed: 10/11/2011 [facility] Patient Information 2015 [place], [place]. This information is not intended to replace advice given to you by your health care provider. Make sure you discuss any questions you have with your health care provider.  Nausea and Vomiting Nausea is a sick feeling that often comes before throwing up (vomiting). Vomiting is a reflex where stomach contents come out of your mouth. Vomiting can cause severe loss of body fluids (dehydration). Children and elderly adults can become dehydrated quickly, especially if they also have diarrhea. Nausea and vomiting are symptoms of a condition or disease. It is important to find the cause of your symptoms. CAUSES   Direct irritation of the stomach lining. This irritation can result from increased acid production (gastroesophageal reflux disease), infection, food poisoning, taking certain medicines (such as nonsteroidal anti-inflammatory drugs), alcohol use, or tobacco use.  Signals from the brain.These signals could be caused by a headache, heat exposure, an inner ear disturbance, increased pressure in the brain from injury, infection, a tumor, or a concussion, pain, emotional stimulus, or metabolic problems.  An obstruction in the gastrointestinal tract (bowel obstruction).  Illnesses such as diabetes, hepatitis, gallbladder problems, appendicitis, kidney problems, cancer, sepsis, atypical symptoms of a heart attack, or eating disorders.  Medical treatments such as chemotherapy and  radiation.  Receiving medicine that makes you sleep (general anesthetic) during surgery. DIAGNOSIS Your caregiver may ask for tests to be done if the problems do not improve after a few days. Tests may also be done if symptoms are severe or if the reason for the nausea and vomiting is not clear. Tests may include:  Urine tests.  Blood tests.  Stool tests.  Cultures (to look for evidence of infection).  X-rays or other imaging studies. Test results can help your caregiver make decisions about treatment or the need for additional tests. TREATMENT You need to stay well hydrated. Drink frequently but in small amounts.You may wish to drink water, sports drinks, clear broth, or eat frozen ice pops or gelatin dessert to help stay hydrated.When you eat, eating slowly may help prevent nausea.There are also some antinausea medicines that may help prevent nausea. HOME CARE INSTRUCTIONS   Take all medicine as directed by your caregiver.  If you do not have an appetite, do not force yourself to eat. However, you must continue to drink fluids.  If you have an appetite, eat a normal diet unless your caregiver tells  you differently.  Eat a variety of complex carbohydrates (rice, wheat, potatoes, bread), lean meats, yogurt, fruits, and vegetables.  Avoid high-fat foods because they are more difficult to digest.  Drink enough water and fluids to keep your urine clear or pale yellow.  If you are dehydrated, ask your caregiver for specific rehydration instructions. Signs of dehydration may include:  Severe thirst.  Dry lips and mouth.  Dizziness.  Dark urine.  Decreasing urine frequency and amount.  Confusion.  Rapid breathing or pulse. SEEK IMMEDIATE MEDICAL CARE IF:   You have blood or brown flecks (like coffee grounds) in your vomit.  You have black or bloody stools.  You have a severe headache or stiff neck.  You are confused.  You have severe abdominal pain.  You have  chest pain or trouble breathing.  You do not urinate at least once every 8 hours.  You develop cold or clammy skin.  You continue to vomit for longer than 24 to 48 hours.  You have a fever. MAKE SURE YOU:   Understand these instructions.  Will watch your condition.  Will get help right away if you are not doing well or get worse. Document Released: 10/22/2005 Document Revised: 01/14/2012 Document Reviewed: 03/21/2011 [facility] Patient Information 2015 [place], [place]. This information is not intended to replace advice given to you by your health care provider. Make sure you discuss any questions you have with your health care provider.

## 2014-05-28 LAB — URINE CULTURE: Colony Count: 75000

## 2014-05-29 ENCOUNTER — Inpatient Hospital Stay (HOSPITAL_COMMUNITY)
Admission: EM | Admit: 2014-05-29 | Discharge: 2014-06-03 | DRG: 603 | Disposition: A | Discharge status: Home Health | Payer: Medicare FFS | Attending: Internal Medicine | Admitting: Internal Medicine

## 2014-05-29 ENCOUNTER — Encounter (HOSPITAL_COMMUNITY): Payer: Self-pay | Admitting: Emergency Medicine

## 2014-05-29 ENCOUNTER — Emergency Department (HOSPITAL_COMMUNITY): Payer: Medicare FFS

## 2014-05-29 ENCOUNTER — Emergency Department (HOSPITAL_COMMUNITY)
Admission: EM | Admit: 2014-05-29 | Discharge: 2014-05-29 | Disposition: A | Discharge status: Nursing Home (Includes ICF) | Payer: Medicare FFS | Source: Home / Self Care

## 2014-05-29 DIAGNOSIS — Z79899 Other long term (current) drug therapy: Secondary | ICD-10-CM

## 2014-05-29 DIAGNOSIS — L0390 Cellulitis, unspecified: Secondary | ICD-10-CM | POA: Diagnosis present

## 2014-05-29 DIAGNOSIS — R918 Other nonspecific abnormal finding of lung field: Secondary | ICD-10-CM | POA: Diagnosis present

## 2014-05-29 DIAGNOSIS — M899 Disorder of bone, unspecified: Secondary | ICD-10-CM | POA: Diagnosis present

## 2014-05-29 DIAGNOSIS — E1129 Type 2 diabetes mellitus with other diabetic kidney complication: Secondary | ICD-10-CM

## 2014-05-29 DIAGNOSIS — L02419 Cutaneous abscess of limb, unspecified: Secondary | ICD-10-CM

## 2014-05-29 DIAGNOSIS — L03119 Cellulitis of unspecified part of limb: Secondary | ICD-10-CM

## 2014-05-29 DIAGNOSIS — IMO0002 Reserved for concepts with insufficient information to code with codable children: Secondary | ICD-10-CM | POA: Diagnosis present

## 2014-05-29 DIAGNOSIS — R911 Solitary pulmonary nodule: Secondary | ICD-10-CM | POA: Diagnosis present

## 2014-05-29 DIAGNOSIS — I129 Hypertensive chronic kidney disease with stage 1 through stage 4 chronic kidney disease, or unspecified chronic kidney disease: Secondary | ICD-10-CM | POA: Diagnosis present

## 2014-05-29 DIAGNOSIS — E039 Hypothyroidism, unspecified: Secondary | ICD-10-CM | POA: Diagnosis present

## 2014-05-29 DIAGNOSIS — M79609 Pain in unspecified limb: Secondary | ICD-10-CM | POA: Diagnosis present

## 2014-05-29 DIAGNOSIS — N319 Neuromuscular dysfunction of bladder, unspecified: Secondary | ICD-10-CM | POA: Diagnosis present

## 2014-05-29 DIAGNOSIS — L03116 Cellulitis of left lower limb: Secondary | ICD-10-CM

## 2014-05-29 DIAGNOSIS — G609 Hereditary and idiopathic neuropathy, unspecified: Secondary | ICD-10-CM | POA: Diagnosis present

## 2014-05-29 DIAGNOSIS — E119 Type 2 diabetes mellitus without complications: Secondary | ICD-10-CM | POA: Diagnosis present

## 2014-05-29 DIAGNOSIS — E559 Vitamin D deficiency, unspecified: Secondary | ICD-10-CM

## 2014-05-29 DIAGNOSIS — A4901 Methicillin susceptible Staphylococcus aureus infection, unspecified site: Secondary | ICD-10-CM | POA: Diagnosis present

## 2014-05-29 DIAGNOSIS — L03032 Cellulitis of left toe: Secondary | ICD-10-CM

## 2014-05-29 DIAGNOSIS — L02619 Cutaneous abscess of unspecified foot: Secondary | ICD-10-CM

## 2014-05-29 DIAGNOSIS — I998 Other disorder of circulatory system: Secondary | ICD-10-CM

## 2014-05-29 DIAGNOSIS — R32 Unspecified urinary incontinence: Secondary | ICD-10-CM | POA: Diagnosis present

## 2014-05-29 DIAGNOSIS — M4712 Other spondylosis with myelopathy, cervical region: Secondary | ICD-10-CM | POA: Diagnosis present

## 2014-05-29 DIAGNOSIS — E785 Hyperlipidemia, unspecified: Secondary | ICD-10-CM | POA: Diagnosis present

## 2014-05-29 DIAGNOSIS — K219 Gastro-esophageal reflux disease without esophagitis: Secondary | ICD-10-CM | POA: Diagnosis present

## 2014-05-29 DIAGNOSIS — G8250 Quadriplegia, unspecified: Secondary | ICD-10-CM

## 2014-05-29 DIAGNOSIS — M1990 Unspecified osteoarthritis, unspecified site: Secondary | ICD-10-CM | POA: Diagnosis present

## 2014-05-29 DIAGNOSIS — L97509 Non-pressure chronic ulcer of other part of unspecified foot with unspecified severity: Secondary | ICD-10-CM | POA: Diagnosis present

## 2014-05-29 DIAGNOSIS — Z885 Allergy status to narcotic agent status: Secondary | ICD-10-CM | POA: Diagnosis not present

## 2014-05-29 DIAGNOSIS — L03129 Acute lymphangitis of unspecified part of limb: Secondary | ICD-10-CM

## 2014-05-29 DIAGNOSIS — Z86718 Personal history of other venous thrombosis and embolism: Secondary | ICD-10-CM

## 2014-05-29 DIAGNOSIS — Z87891 Personal history of nicotine dependence: Secondary | ICD-10-CM | POA: Diagnosis not present

## 2014-05-29 DIAGNOSIS — N183 Chronic kidney disease, stage 3 unspecified: Secondary | ICD-10-CM | POA: Diagnosis present

## 2014-05-29 DIAGNOSIS — D638 Anemia in other chronic diseases classified elsewhere: Secondary | ICD-10-CM | POA: Diagnosis present

## 2014-05-29 DIAGNOSIS — E782 Mixed hyperlipidemia: Secondary | ICD-10-CM

## 2014-05-29 DIAGNOSIS — M720 Palmar fascial fibromatosis [Dupuytren]: Secondary | ICD-10-CM

## 2014-05-29 DIAGNOSIS — Z801 Family history of malignant neoplasm of trachea, bronchus and lung: Secondary | ICD-10-CM

## 2014-05-29 DIAGNOSIS — M949 Disorder of cartilage, unspecified: Secondary | ICD-10-CM

## 2014-05-29 DIAGNOSIS — E871 Hypo-osmolality and hyponatremia: Secondary | ICD-10-CM | POA: Diagnosis present

## 2014-05-29 DIAGNOSIS — L03039 Cellulitis of unspecified toe: Principal | ICD-10-CM | POA: Diagnosis present

## 2014-05-29 DIAGNOSIS — I82402 Acute embolism and thrombosis of unspecified deep veins of left lower extremity: Secondary | ICD-10-CM

## 2014-05-29 DIAGNOSIS — Z888 Allergy status to other drugs, medicaments and biological substances status: Secondary | ICD-10-CM

## 2014-05-29 DIAGNOSIS — G8254 Quadriplegia, C5-C7 incomplete: Secondary | ICD-10-CM

## 2014-05-29 DIAGNOSIS — I739 Peripheral vascular disease, unspecified: Secondary | ICD-10-CM | POA: Diagnosis present

## 2014-05-29 DIAGNOSIS — D509 Iron deficiency anemia, unspecified: Secondary | ICD-10-CM | POA: Diagnosis present

## 2014-05-29 DIAGNOSIS — Z7982 Long term (current) use of aspirin: Secondary | ICD-10-CM | POA: Diagnosis not present

## 2014-05-29 DIAGNOSIS — N179 Acute kidney failure, unspecified: Secondary | ICD-10-CM | POA: Diagnosis present

## 2014-05-29 DIAGNOSIS — F419 Anxiety disorder, unspecified: Secondary | ICD-10-CM

## 2014-05-29 DIAGNOSIS — E876 Hypokalemia: Secondary | ICD-10-CM | POA: Diagnosis present

## 2014-05-29 DIAGNOSIS — L0291 Cutaneous abscess, unspecified: Secondary | ICD-10-CM

## 2014-05-29 DIAGNOSIS — E038 Other specified hypothyroidism: Secondary | ICD-10-CM

## 2014-05-29 DIAGNOSIS — I10 Essential (primary) hypertension: Secondary | ICD-10-CM

## 2014-05-29 DIAGNOSIS — L03126 Acute lymphangitis of left lower limb: Secondary | ICD-10-CM | POA: Diagnosis present

## 2014-05-29 LAB — CBC WITH DIFFERENTIAL/PLATELET
Basophils Absolute: 0 10*3/uL (ref 0.0–0.1)
Basophils Relative: 0 % (ref 0–1)
Eosinophils Absolute: 1.1 10*3/uL — ABNORMAL HIGH (ref 0.0–0.7)
Eosinophils Relative: 6 % — ABNORMAL HIGH (ref 0–5)
HCT: 35.1 % — ABNORMAL LOW (ref 36.0–46.0)
HEMOGLOBIN: 11.6 g/dL — AB (ref 12.0–15.0)
LYMPHS PCT: 11 % — AB (ref 12–46)
Lymphs Abs: 2 10*3/uL (ref 0.7–4.0)
MCH: 28.1 pg (ref 26.0–34.0)
MCHC: 33 g/dL (ref 30.0–36.0)
MCV: 85 fL (ref 78.0–100.0)
MONOS PCT: 9 % (ref 3–12)
Monocytes Absolute: 1.6 10*3/uL — ABNORMAL HIGH (ref 0.1–1.0)
Neutro Abs: 13.2 10*3/uL — ABNORMAL HIGH (ref 1.7–7.7)
Neutrophils Relative %: 74 % (ref 43–77)
PLATELETS: 247 10*3/uL (ref 150–400)
RBC: 4.13 MIL/uL (ref 3.87–5.11)
RDW: 13.7 % (ref 11.5–15.5)
WBC: 17.9 10*3/uL — AB (ref 4.0–10.5)

## 2014-05-29 LAB — CBC
HCT: 30.9 % — ABNORMAL LOW (ref 36.0–46.0)
HEMOGLOBIN: 10.3 g/dL — AB (ref 12.0–15.0)
MCH: 27.9 pg (ref 26.0–34.0)
MCHC: 33.3 g/dL (ref 30.0–36.0)
MCV: 83.7 fL (ref 78.0–100.0)
Platelets: 229 10*3/uL (ref 150–400)
RBC: 3.69 MIL/uL — AB (ref 3.87–5.11)
RDW: 13.6 % (ref 11.5–15.5)
WBC: 14.9 10*3/uL — ABNORMAL HIGH (ref 4.0–10.5)

## 2014-05-29 LAB — BASIC METABOLIC PANEL
Anion gap: 14 (ref 5–15)
BUN: 31 mg/dL — AB (ref 6–23)
CO2: 26 mEq/L (ref 19–32)
Calcium: 8.8 mg/dL (ref 8.4–10.5)
Chloride: 98 mEq/L (ref 96–112)
Creatinine, Ser: 1.31 mg/dL — ABNORMAL HIGH (ref 0.50–1.10)
GFR calc Af Amer: 44 mL/min — ABNORMAL LOW (ref >90)
GFR calc non Af Amer: 38 mL/min — ABNORMAL LOW (ref >90)
GLUCOSE: 87 mg/dL (ref 70–99)
POTASSIUM: 4.1 meq/L (ref 3.7–5.3)
SODIUM: 138 meq/L (ref 137–147)

## 2014-05-29 LAB — RETICULOCYTES
RBC.: 3.72 MIL/uL — AB (ref 3.87–5.11)
Retic Count, Absolute: 26 10*3/uL (ref 19.0–186.0)
Retic Ct Pct: 0.7 % (ref 0.4–3.1)

## 2014-05-29 LAB — CREATININE, SERUM
Creatinine, Ser: 1.2 mg/dL — ABNORMAL HIGH (ref 0.50–1.10)
GFR calc Af Amer: 48 mL/min — ABNORMAL LOW (ref >90)
GFR calc non Af Amer: 42 mL/min — ABNORMAL LOW (ref >90)

## 2014-05-29 LAB — SEDIMENTATION RATE: Sed Rate: 46 mm/hr — ABNORMAL HIGH (ref 0–22)

## 2014-05-29 MED ORDER — ATENOLOL 25 MG PO TABS
25.0000 mg | ORAL_TABLET | Freq: Two times a day (BID) | ORAL | Status: DC
  Administered 2014-05-29 – 2014-05-30 (×3): 25 mg via ORAL
  Filled 2014-05-29 (×5): qty 1

## 2014-05-29 MED ORDER — DARIFENACIN HYDROBROMIDE ER 15 MG PO TB24
15.0000 mg | ORAL_TABLET | Freq: Every day | ORAL | Status: DC
  Administered 2014-05-30 – 2014-06-03 (×5): 15 mg via ORAL
  Filled 2014-05-29 (×5): qty 1

## 2014-05-29 MED ORDER — VANCOMYCIN HCL 10 G IV SOLR
1250.0000 mg | INTRAVENOUS | Status: DC
  Administered 2014-05-29: 1250 mg via INTRAVENOUS
  Filled 2014-05-29: qty 1250

## 2014-05-29 MED ORDER — VITAMIN D3 25 MCG (1000 UNIT) PO TABS
5000.0000 [IU] | ORAL_TABLET | Freq: Two times a day (BID) | ORAL | Status: DC
  Administered 2014-05-29 – 2014-06-03 (×10): 5000 [IU] via ORAL
  Filled 2014-05-29 (×11): qty 5

## 2014-05-29 MED ORDER — LISINOPRIL 20 MG PO TABS: 20.0000 mg | ORAL_TABLET | Freq: Every day | ORAL | Status: DC

## 2014-05-29 MED ORDER — HEPARIN SODIUM (PORCINE) 5000 UNIT/ML IJ SOLN
5000.0000 [IU] | Freq: Three times a day (TID) | INTRAMUSCULAR | Status: DC
  Administered 2014-05-29 – 2014-05-31 (×6): 5000 [IU] via SUBCUTANEOUS
  Filled 2014-05-29 (×8): qty 1

## 2014-05-29 MED ORDER — MAGNESIUM OXIDE 400 (241.3 MG) MG PO TABS
200.0000 mg | ORAL_TABLET | Freq: Two times a day (BID) | ORAL | Status: DC
  Administered 2014-05-29 – 2014-06-03 (×10): 200 mg via ORAL
  Filled 2014-05-29 (×11): qty 0.5

## 2014-05-29 MED ORDER — MAGNESIUM 250 MG PO TABS: 250.0000 mg | ORAL_TABLET | Freq: Two times a day (BID) | ORAL | Status: DC

## 2014-05-29 MED ORDER — MECLIZINE HCL 25 MG PO TABS
25.0000 mg | ORAL_TABLET | Freq: Two times a day (BID) | ORAL | Status: DC | PRN
  Filled 2014-05-29: qty 1

## 2014-05-29 MED ORDER — METHOCARBAMOL 500 MG PO TABS
250.0000 mg | ORAL_TABLET | Freq: Three times a day (TID) | ORAL | Status: DC
  Administered 2014-05-29 – 2014-06-03 (×14): 250 mg via ORAL
  Filled 2014-05-29 (×19): qty 0.5

## 2014-05-29 MED ORDER — SODIUM CHLORIDE 0.9 % IJ SOLN
3.0000 mL | Freq: Two times a day (BID) | INTRAMUSCULAR | Status: DC
  Administered 2014-05-30 – 2014-06-03 (×7): 3 mL via INTRAVENOUS

## 2014-05-29 MED ORDER — GABAPENTIN 100 MG PO CAPS
200.0000 mg | ORAL_CAPSULE | Freq: Three times a day (TID) | ORAL | Status: DC
  Administered 2014-05-29 – 2014-06-03 (×14): 200 mg via ORAL
  Filled 2014-05-29 (×16): qty 2

## 2014-05-29 MED ORDER — ATORVASTATIN CALCIUM 10 MG PO TABS
5.0000 mg | ORAL_TABLET | Freq: Every day | ORAL | Status: DC
  Administered 2014-05-29 – 2014-06-02 (×5): 5 mg via ORAL
  Filled 2014-05-29 (×6): qty 0.5

## 2014-05-29 MED ORDER — ONDANSETRON 4 MG PO TBDP
4.0000 mg | ORAL_TABLET | Freq: Three times a day (TID) | ORAL | Status: DC | PRN
  Filled 2014-05-29: qty 1

## 2014-05-29 MED ORDER — LEVOTHYROXINE SODIUM 75 MCG PO TABS
75.0000 ug | ORAL_TABLET | Freq: Every day | ORAL | Status: DC
  Administered 2014-05-30 – 2014-06-03 (×5): 75 ug via ORAL
  Filled 2014-05-29 (×6): qty 1

## 2014-05-29 MED ORDER — OXYBUTYNIN CHLORIDE ER 5 MG PO TB24
5.0000 mg | ORAL_TABLET | Freq: Two times a day (BID) | ORAL | Status: DC
  Administered 2014-05-29 – 2014-06-03 (×10): 5 mg via ORAL
  Filled 2014-05-29 (×11): qty 1

## 2014-05-29 MED ORDER — SODIUM CHLORIDE 0.9 % IV SOLN
INTRAVENOUS | Status: DC
  Administered 2014-05-29 – 2014-06-01 (×5): via INTRAVENOUS

## 2014-05-29 MED ORDER — LISINOPRIL 20 MG PO TABS
20.0000 mg | ORAL_TABLET | Freq: Every day | ORAL | Status: DC
  Administered 2014-05-30 – 2014-06-03 (×5): 20 mg via ORAL
  Filled 2014-05-29 (×6): qty 1

## 2014-05-29 NOTE — Progress Notes (Signed)
ANTIBIOTIC CONSULT NOTE - INITIAL  Pharmacy Consult for Vancomycin Indication: cellulitis  Allergies  Allergen Reactions  . Codeine Other (See Comments)    Jittery, nervous & hallucinations  . Oxybutynin     Tired   Labs:  Recent Labs  05/27/14 1125 05/27/14 1148  WBC 23.7*  --   HGB 12.3 13.3  PLT 263  --   CREATININE 0.96 1.00    Microbiology: Recent Results (from the past 720 hour(s))  URINE CULTURE     Status: None   Collection Time    05/27/14  1:28 PM      Result Value Ref Range Status   Specimen Description URINE, CLEAN CATCH   Final   Special Requests NONE   Final   Culture  Setup Time     Final   Value: 05/27/2014 18:31     Performed at [facility]     Final   Value: 75,000 COLONIES/ML     Performed at [facility]   Culture     Final   Value: Multiple bacterial morphotypes present, none predominant. Suggest appropriate recollection if clinically indicated.     Performed at [facility]   Report Status 05/28/2014 FINAL   Final  CULTURE, BLOOD (ROUTINE X 2)     Status: None   Collection Time    05/27/14  1:35 PM      Result Value Ref Range Status   Specimen Description BLOOD LEFT ANTECUBITAL   Final   Special Requests BOTTLES DRAWN AEROBIC AND ANAEROBIC 10CC   Final   Culture  Setup Time     Final   Value: 05/27/2014 17:40     Performed at [facility]   Culture     Final   Value:        BLOOD CULTURE RECEIVED NO GROWTH TO DATE CULTURE WILL BE HELD FOR 5 DAYS BEFORE ISSUING A FINAL NEGATIVE REPORT     Performed at [facility]   Report Status PENDING   Incomplete  CULTURE, BLOOD (ROUTINE X 2)     Status: None   Collection Time    05/27/14  1:40 PM      Result Value Ref Range Status   Specimen Description BLOOD RIGHT ANTECUBITAL   Final   Special Requests BOTTLES DRAWN AEROBIC AND ANAEROBIC 10CC   Final   Culture  Setup Time     Final   Value: 05/27/2014 17:40     Performed at [facility]   Culture     Final   Value:        BLOOD CULTURE RECEIVED NO GROWTH TO DATE CULTURE WILL BE HELD FOR 5 DAYS BEFORE ISSUING A FINAL NEGATIVE REPORT     Performed at [facility]   Report Status PENDING   Incomplete    Medical History: Past Medical History  Diagnosis Date  . Osteoarthritis   . Hyperlipidemia   . GERD (gastroesophageal reflux disease)   . Peripheral neuropathy   . Osteopenia   . DDD (degenerative disc disease)   . Shingles   . Colon cancer 1977  . Thyroid cancer 1966  . Peripheral vascular disease   . Deep vein blood clot of left lower extremity 05/06/13    Assessment: 80 year old female to begin vancomycin for left foot cellulitis.  Foot is swollen, red, and unable to palpate pulse.  Goal of Therapy:  Vancomycin trough level 10-15 mcg/ml  Plan:  1) Vancomycin 1250 mg iv Q 24 hours 2) Follow up Scr, cultures, progress  Thank you. Anette Guarneri, PharmD [phone]  05/29/2014,4:08 PM

## 2014-05-29 NOTE — ED Notes (Signed)
Pt in X ray

## 2014-05-29 NOTE — ED Notes (Signed)
Dressing noted to left great toe; states she has been treating toe wound x 1 month under care of PCP.  Started to notice redness and swelling to left foot yesterday.  Large amount swelling and deep redness noted.Red streak now extending up to left inner thigh.  Denies fevers.  Pt is poor historian - family member that normally cares for her is out of town.  Cannot verify her medicines; brought filled Rx papers, but cannot verify if she is still on those meds.

## 2014-05-29 NOTE — ED Provider Notes (Signed)
CSN: [id number]     Arrival date & time 05/29/14  1529 History   First MD Initiated Contact with Patient 05/29/14 1548     Chief Complaint  Patient presents with  . Foot Problem     (Consider location/radiation/quality/duration/timing/severity/associated sxs/prior Treatment) HPI Comments: Patient presents emergency department with chief complaint of redness, swelling, and tenderness to the left foot. She states the symptoms started 2 days ago. She also reports pain of the left leg. She has not tried taking anything to alleviate her symptoms. She does report having an open sore on her left great toe, which is been there for several months. She denies fevers or chills. Denies any nausea or vomiting. Denies any history of diabetes. Was treated 2-3 days ago for pneumonia with Levaquin in the ED.   The history is provided by the patient. No language interpreter was used.    Past Medical History  Diagnosis Date  . Osteoarthritis   . Hyperlipidemia   . GERD (gastroesophageal reflux disease)   . Peripheral neuropathy   . Osteopenia   . DDD (degenerative disc disease)   . Shingles   . Colon cancer 1977  . Thyroid cancer 1966  . Peripheral vascular disease   . Deep vein blood clot of left lower extremity 05/06/13   Past Surgical History  Procedure Laterality Date  . Thyroidectomy    . Total abdominal hysterectomy w/ bilateral salpingoophorectomy  1968  . Colon resection  1977  . Cervical laminectomy  1995  . Cataract extraction      bilateral  . Breast enhancement surgery      silicone  . Breast surgery    . Posterior cervical fusion/foraminotomy N/A 03/07/2013    Procedure: Cervical four -seven POSTERIOR CERVICAL FUSION;  Surgeon: Elaina Hoops, MD;  Location: [facility];  Service: Neurosurgery;  Laterality: N/A;   Family History  Problem Relation Age of Onset  . Lung cancer Father    History  Substance Use Topics  . Smoking status: Former Smoker -- 1.00 packs/day for 20 years   Quit date: 11/05/1972  . Smokeless tobacco: Never Used  . Alcohol Use: No   OB History   Grav Para Term Preterm Abortions TAB SAB Ect Mult Living                 Review of Systems  All other systems reviewed and are negative.     Allergies  Codeine and Oxybutynin  Home Medications   Prior to Admission medications   Medication Sig Start Date End Date Taking? Authorizing Provider  aspirin EC 81 MG tablet Take 162 mg by mouth daily.    Historical Provider, MD  atenolol (TENORMIN) 50 MG tablet Take 25 mg by mouth daily. For BP 05/17/14 05/18/15  Unk Pinto, MD  atorvastatin (LIPITOR) 10 MG tablet Take 5 mg by mouth at bedtime.    Historical Provider, MD  Cholecalciferol (VITAMIN D3 HIGH POTENCY PO) Take 2,000-5,000 Units by mouth 2 (two) times daily. Take 5000 units in the morning and take 2000 units in the evening.    Historical Provider, MD  cyclobenzaprine (FLEXERIL) 10 MG tablet Take 10 mg by mouth.    Historical Provider, MD  fish oil-omega-3 fatty acids 1000 MG capsule Take 1 g by mouth daily.     Historical Provider, MD  gabapentin (NEURONTIN) 100 MG capsule Take 100 mg by mouth.    Historical Provider, MD  levofloxacin (LEVAQUIN) 500 MG tablet Take 1 tablet (500 mg total)  by mouth daily. 05/27/14   Tanna Furry, MD  levothyroxine (SYNTHROID, LEVOTHROID) 75 MCG tablet Take 75 mcg by mouth daily.      Historical Provider, MD  lisinopril (PRINIVIL,ZESTRIL) 20 MG tablet Take 1 tablet (20 mg total) by mouth daily. 04/10/13   Bary Leriche, PA-C  loratadine (CLARITIN) 10 MG tablet Take 10 mg by mouth daily as needed for allergies.    Historical Provider, MD  Magnesium 250 MG TABS Take 250 mg by mouth 2 (two) times daily.     Historical Provider, MD  meclizine (ANTIVERT) 25 MG tablet Take 25 mg by mouth 2 (two) times daily as needed for dizziness.     Historical Provider, MD  meloxicam (MOBIC) 15 MG tablet Take 1 tablet (15 mg total) by mouth daily. For Pain and inflammation 11/26/13    Unk Pinto, MD  methocarbamol (ROBAXIN) 500 MG tablet Take 500 mg by mouth.    Historical Provider, MD  naproxen sodium (ANAPROX) 220 MG tablet Take 440 mg by mouth daily as needed (pain).    Historical Provider, MD  ondansetron (ZOFRAN ODT) 4 MG disintegrating tablet Take 1 tablet (4 mg total) by mouth every 8 (eight) hours as needed for nausea. 05/27/14   Tanna Furry, MD  oxybutynin (DITROPAN-XL) 5 MG 24 hr tablet Take 5 mg by mouth at bedtime.    Historical Provider, MD  solifenacin (VESICARE) 10 MG tablet Take by mouth daily.    Historical Provider, MD  traMADol (ULTRAM) 50 MG tablet Take 50 mg by mouth 3 (three) times daily as needed for moderate pain.    Historical Provider, MD   BP 152/70  Pulse 66  Temp(Src) 98.3 F (36.8 C) (Oral)  Resp 18  SpO2 98% Physical Exam  Nursing note and vitals reviewed. Constitutional: She is oriented to person, place, and time. She appears well-developed and well-nourished.  HENT:  Head: Normocephalic and atraumatic.  Eyes: Conjunctivae and EOM are normal. Pupils are equal, round, and reactive to light.  Neck: Normal range of motion. Neck supple.  Cardiovascular: Normal rate and regular rhythm.  Exam reveals no gallop and no friction rub.   No murmur heard. Distal pulses are dopplerable bilaterally with the right foot being much more faint that the left  The right foot is cool to the touch and bluish in color  Pulmonary/Chest: Effort normal and breath sounds normal. No respiratory distress. She has no wheezes. She has no rales. She exhibits no tenderness.  Abdominal: Soft. Bowel sounds are normal. She exhibits no distension and no mass. There is no tenderness. There is no rebound and no guarding.  Musculoskeletal: Normal range of motion. She exhibits no edema and no tenderness.  Neurological: She is alert and oriented to person, place, and time.  Skin: Skin is warm and dry.     Erythema and swelling to the left foot, with some streaking up the  left leg, dime-sized sore on the bottom of the left great toe  Psychiatric: She has a normal mood and affect. Her behavior is normal. Judgment and thought content normal.    ED Course  Procedures (including critical care time) Results for orders placed during the hospital encounter of 05/29/14  CBC WITH DIFFERENTIAL      Result Value Ref Range   WBC 17.9 (*) 4.0 - 10.5 K/uL   RBC 4.13  3.87 - 5.11 MIL/uL   Hemoglobin 11.6 (*) 12.0 - 15.0 g/dL   HCT 35.1 (*) 36.0 - 46.0 %  MCV 85.0  78.0 - 100.0 fL   MCH 28.1  26.0 - 34.0 pg   MCHC 33.0  30.0 - 36.0 g/dL   RDW 13.7  11.5 - 15.5 %   Platelets 247  150 - 400 K/uL   Neutrophils Relative % 74  43 - 77 %   Neutro Abs 13.2 (*) 1.7 - 7.7 K/uL   Lymphocytes Relative 11 (*) 12 - 46 %   Lymphs Abs 2.0  0.7 - 4.0 K/uL   Monocytes Relative 9  3 - 12 %   Monocytes Absolute 1.6 (*) 0.1 - 1.0 K/uL   Eosinophils Relative 6 (*) 0 - 5 %   Eosinophils Absolute 1.1 (*) 0.0 - 0.7 K/uL   Basophils Relative 0  0 - 1 %   Basophils Absolute 0.0  0.0 - 0.1 K/uL  BASIC METABOLIC PANEL      Result Value Ref Range   Sodium 138  137 - 147 mEq/L   Potassium 4.1  3.7 - 5.3 mEq/L   Chloride 98  96 - 112 mEq/L   CO2 26  19 - 32 mEq/L   Glucose, Bld 87  70 - 99 mg/dL   BUN 31 (*) 6 - 23 mg/dL   Creatinine, Ser 1.31 (*) 0.50 - 1.10 mg/dL   Calcium 8.8  8.4 - 10.5 mg/dL   GFR calc non Af Amer 38 (*) >90 mL/min   GFR calc Af Amer 44 (*) >90 mL/min   Anion gap 14  5 - 15   Dg Chest 2 View  05/27/2014   CLINICAL DATA:  Fatigue and chest pain  EXAM: CHEST  2 VIEW  COMPARISON:  03/07/2013  FINDINGS: COPD with hyperinflation. Densely calcified breast implants overlie the lung bases and obscure this area.  Right hilar density has developed since the prior study. This may represent pneumonia or mass. Follow-up recommended. No effusion or heart failure.  IMPRESSION: COPD.  Prominent right hilum which may represent pneumonia or mass lesion. Followup chest x-ray or chest  CT suggested.   Electronically Signed   By: Franchot Gallo M.D.   On: 05/27/2014 12:53   Ct Chest W Contrast  05/27/2014   CLINICAL DATA:  Fever malaise. Rule out pneumonia. Abnormal chest x-ray  EXAM: CT CHEST WITH CONTRAST  TECHNIQUE: Multidetector CT imaging of the chest was performed during intravenous contrast administration.  CONTRAST:  19mL OMNIPAQUE IOHEXOL 300 MG/ML  SOLN  COMPARISON:  CT chest 07/02/2011  FINDINGS: Spiculated mass in the superior segment right lower lobe has progressed since the prior CT. The mass now measures 5.4 x 3.5 cm and is compatible with carcinoma. Right lower paratracheal lymph node measures 12 mm and has enlarged in the interval. 9 x 20 mm subcarinal lymph node has progressed slightly.  Several small lung nodules on the right have progressed. Subpleural nodules are present right upper lobe. Nodular density right medial lung base measures 11 mm and is new. These findings are suspicious for metastatic disease.  No pleural effusion.  Negative for pneumonia.  IMPRESSION: Spiculated mass superior segment right lower lobe has progressed since 2012 compatible with carcinoma. Several small lung nodules on the right are suspicious for metastatic disease. Mediastinal adenopathy suspicious for metastatic disease.   Electronically Signed   By: Franchot Gallo M.D.   On: 05/27/2014 14:40   Dg Foot Complete Left  05/29/2014   CLINICAL DATA:  Pain and swelling of the dorsal aspect of the left foot for the past several days.  EXAM: LEFT FOOT - COMPLETE 3+ VIEW  COMPARISON:  No priors.  FINDINGS: Soft tissue swelling dorsal to the metatarsals. No acute displaced fracture, subluxation or dislocation. Multifocal degenerative changes of osteoarthritis are noted throughout the DIP and PIP joints, as well as the midfoot and hindfoot.  IMPRESSION: 1. Soft tissue swelling along the dorsal aspect of the foot overlying the metatarsals. No underlying acute bony abnormality.   Electronically Signed    By: Vinnie Langton M.D.   On: 05/29/2014 17:43      EKG Interpretation None      MDM   Final diagnoses:  Cellulitis of left lower extremity  Peripheral vascular disease    Patient has cellulitis to the left foot also with some streaking up the left leg. Check labs and start antibiotics.  Also right foot concerning for developing ischemia.  The foot is cool to the touch, pulses are not palpable, but they are dopplerable.  5:15 PM Patient discussed with Dr. Tamera Punt, who recommends admission.  Patient will also likely need non-emergent vascular consult for right foot.    6:18 PM Patient discussed with [facility], who will admit the patient.    6:29 PM Unable to reach Dr. Donnetta Hutching for consultation.  I left a message, and will try again in 30 minutes.  6:43 PM Discussed with Dr. Donnetta Hutching ([phone]) from vascular who will consult.    Montine Circle, PA-C 05/29/14 1844

## 2014-05-29 NOTE — ED Notes (Addendum)
Blood cultures were drawn prior to antibiotics given. Phlebotomist drew second set of cultures and will click off in the computer.

## 2014-05-29 NOTE — H&P (Addendum)
Hospitalist Admission History and Physical  Patient name: Cassandra Walker record number: [id number] Date of birth: Aug 27, 1932 Age: 80 y.o. Gender: female  Primary Care Provider: Alesia Richards, MD  Chief Complaint: Left lower extremity cellulitis, right lower extremity diminished pulses History of Present Illness:This is a 80 y.o. year old female with significant past medical history of hypertension, neurogenic bladder, history of pulmonary nodule, lower extremity DVT, history of cervical fracture, chronic left lower extremity wound presenting with left lower tremor he cellulitis, right lower extremity diminished pulses. The family states the patient had a significant fall last year with a subsequent cervical fracture. Since this point, family states patient has had decreased functionality while at home. Does have a home care provider to help her with things around the house with her granddaughter. Per the family, granddaughter has been out of town. There has been reports of progressive left lower extremity redness and pain over the past 2-3 days. Per the patient, she has been seeing outpatient wound care for chronic left lower extremity wound. Denies any fevers or chills. No purulent drainage. Patient noted to have been seen on July 23 for fatigue with? Infiltrate on chest x-ray. Patient was placed on extended course of Levaquin for treatment. Has been compliant with this. Nondiabetic. Patient presented to the laser urticaria symptoms. Was directed to come to the ER for further evaluation On presentation, hemodynamically stable. Afebrile. Systolic pressures in the 469G to 295 systolic. Satting greater than 98% on room air. White blood cell count 17.9, hemoglobin 11.6, creatinine 1.3 (baseline of one). Left foot x-ray with soft tissue swelling around the dorsal aspect of the foot and underlying metatarsals. Chest CT shows spiculated mass in the superior segment of the right lower lobe that  has progressed since 2012 compatible with carcinoma as well as several small lung nodules on the right there are suspicious for metastatic disease as well as mediastinal adenopathy. Patient started on IV vancomycin. Blood cultures obtained. There is also question of right lower extremity diminished pulses there were minimally palpable on exam as well as by Doppler in the ER. I have asked her physician to consult vascular surgery about this. Assessment and Plan: Cassandra Walker is a 80 y.o. year old female presenting with cellultis w/ lymphangitis   Active Problems:   Cellulitis   Acute lymphangitis of left lower extremity   ?Lower limb ischemia   Cellulitis: Continue IV vancomycin. Her cultures. We'll continue to follow closely. Does not clinically meets this criteria as of yet. Vital signs within normal limits. Noted leukocytosis in the setting of cellulitis. Check sedimentation rate to rule out osteomyelitis. Left great toe ulcer with a firm eschar. Note secondary lymphangitis likely secondary to streptococcal infection. Continue to watch closely to  Right lower extremity diminished pulses. No diminished pulses on exam and by duplex at bedside. Vascular consult pending. Follow up on recommendations. Note history of DVT in the past. Unclear if this is related, though less likely. We'll check a lower extremity ultrasound to evaluate.  Pulmonary nodules: Clinical progression of disease based on CT imaging. We'll need further discussion about this with family. No active pulmonary complaints currently. Continue to follow.  Hypertension: Elevated blood pressures on admission. Continue home regimen. Titrate as clinically indicated.  Anemia: likely anemia of chronic disease in setting of malignancy. No active signs of bleeding. Check anemia panel.   AKI: likely prerenal in etiology. Hydrate and reassess.   FEN/GI: heart healthy diet.  Prophylaxis: sub q heparin  Disposition: pending further  evaluatinon  Code Status:Full Code    Patient Active Problem List   Diagnosis Date Noted  . Cellulitis 05/29/2014  . Vitamin D Deficiency 04/29/2014  . Encounter for long-term (current) use of other medications 04/29/2014  . T2_NIDDM w/Stage 3 CKD (GFR 53 ml/min) 04/29/2014  . Neurogenic bladder 01/11/2014  . Tetraplegia 10/13/2013  . Hypothyroidism 06/29/2013  . Anxiety 06/29/2013  . Hyperlipidemia 06/29/2013  . Dupuytren's contracture of both hands 06/19/2013  . Left leg DVT 05/06/2013  . Vestibular dysfunction 04/09/2013  . C7 cervical fracture 03/18/2013  . Fracture of C5 vertebra, closed 03/10/2013  . C6 cervical fracture 03/10/2013  . Incomplete quadriplegia at C5-6 level 03/10/2013  . HTN (hypertension) 03/10/2013  . Pulmonary nodule 07/24/2011   Past Medical History: Past Medical History  Diagnosis Date  . Osteoarthritis   . Hyperlipidemia   . GERD (gastroesophageal reflux disease)   . Peripheral neuropathy   . Osteopenia   . DDD (degenerative disc disease)   . Shingles   . Colon cancer 1977  . Thyroid cancer 1966  . Peripheral vascular disease   . Deep vein blood clot of left lower extremity 05/06/13    Past Surgical History: Past Surgical History  Procedure Laterality Date  . Thyroidectomy    . Total abdominal hysterectomy w/ bilateral salpingoophorectomy  1968  . Colon resection  1977  . Cervical laminectomy  1995  . Cataract extraction      bilateral  . Breast enhancement surgery      silicone  . Breast surgery    . Posterior cervical fusion/foraminotomy N/A 03/07/2013    Procedure: Cervical four -seven POSTERIOR CERVICAL FUSION;  Surgeon: Elaina Hoops, MD;  Location: [facility];  Service: Neurosurgery;  Laterality: N/A;    Social History: History   Social History  . Marital Status: Widowed    Spouse Name: N/A    Number of Children: 4  . Years of Education: N/A   Occupational History  . retired from [employer]   . Smoking status: Former Smoker -- 1.00 packs/day for 20 years    Quit date: 11/05/1972  . Smokeless tobacco: Never Used  . Alcohol Use: No  . Drug Use: None  . Sexual Activity: None   Other Topics Concern  . None   Social History Narrative  . None    Family History: Family History  Problem Relation Age of Onset  . Lung cancer Father     Allergies: Allergies  Allergen Reactions  . Codeine Other (See Comments)    Jittery, nervous & hallucinations  . Oxybutynin     Tired    Current Facility-Administered Medications  Medication Dose Route Frequency Provider Last Rate Last Dose  . 0.9 %  sodium chloride infusion   Intravenous Continuous Shanda Howells, MD      . atenolol (TENORMIN) tablet 25 mg  25 mg Oral BID Shanda Howells, MD      . atorvastatin (LIPITOR) tablet 5 mg  5 mg Oral QHS Shanda Howells, MD      . Cholecalciferol 5,000 Units  5,000 Units Oral BID Shanda Howells, MD      . darifenacin (ENABLEX) 24 hr tablet 15 mg  15 mg Oral Daily Shanda Howells, MD      . gabapentin (NEURONTIN) capsule 200 mg  200 mg Oral TID Shanda Howells, MD      . heparin injection 5,000 Units  5,000 Units Subcutaneous 3 times  per day Shanda Howells, MD      . levothyroxine (SYNTHROID, LEVOTHROID) tablet 75 mcg  75 mcg Oral Daily Shanda Howells, MD      . lisinopril (PRINIVIL,ZESTRIL) tablet 20 mg  20 mg Oral Daily Shanda Howells, MD      . Magnesium TABS 250 mg  250 mg Oral BID Shanda Howells, MD      . meclizine (ANTIVERT) tablet 25 mg  25 mg Oral BID PRN Shanda Howells, MD      . methocarbamol (ROBAXIN) tablet 250 mg  250 mg Oral TID Shanda Howells, MD      . ondansetron (ZOFRAN-ODT) disintegrating tablet 4 mg  4 mg Oral Q8H PRN Shanda Howells, MD      . oxybutynin (DITROPAN-XL) 24 hr tablet 5 mg  5 mg Oral BID Shanda Howells, MD      . sodium chloride 0.9 % injection 3 mL  3 mL Intravenous Q12H Shanda Howells, MD      . vancomycin (VANCOCIN) 1,250 mg in sodium chloride 0.9 % 250 mL IVPB  1,250 mg  Intravenous Q24H Malvin Johns, MD   1,250 mg at 05/29/14 1633   Current Outpatient Prescriptions  Medication Sig Dispense Refill  . atenolol (TENORMIN) 25 MG tablet Take 25 mg by mouth 2 (two) times daily.      Marland Kitchen atorvastatin (LIPITOR) 10 MG tablet Take 5 mg by mouth at bedtime.      . Cholecalciferol (VITAMIN D3 HIGH POTENCY PO) Take 2,000-5,000 Units by mouth 2 (two) times daily. Take 5000 units in the morning and take 2000 units in the evening.      . cyclobenzaprine (FLEXERIL) 10 MG tablet Take 5 mg by mouth 3 (three) times daily as needed for muscle spasms.       . fish oil-omega-3 fatty acids 1000 MG capsule Take 1 g by mouth daily.       Marland Kitchen gabapentin (NEURONTIN) 100 MG capsule Take 200 mg by mouth 3 (three) times daily.       Marland Kitchen levofloxacin (LEVAQUIN) 500 MG tablet Take 1 tablet (500 mg total) by mouth daily.  10 tablet  0  . levothyroxine (SYNTHROID, LEVOTHROID) 75 MCG tablet Take 75 mcg by mouth daily.        Marland Kitchen lisinopril (PRINIVIL,ZESTRIL) 20 MG tablet Take 1 tablet (20 mg total) by mouth daily.      . Magnesium 250 MG TABS Take 250 mg by mouth 2 (two) times daily.       . meloxicam (MOBIC) 15 MG tablet Take 1 tablet (15 mg total) by mouth daily. For Pain and inflammation  90 tablet  99  . methocarbamol (ROBAXIN) 500 MG tablet Take 250 mg by mouth 3 (three) times daily.       . ondansetron (ZOFRAN ODT) 4 MG disintegrating tablet Take 1 tablet (4 mg total) by mouth every 8 (eight) hours as needed for nausea.  6 tablet  0  . oxybutynin (DITROPAN-XL) 5 MG 24 hr tablet Take 5 mg by mouth 2 (two) times daily.       . solifenacin (VESICARE) 10 MG tablet Take 10 mg by mouth daily.       . traMADol (ULTRAM) 50 MG tablet Take 50 mg by mouth 3 (three) times daily as needed for moderate pain.      Marland Kitchen loratadine (CLARITIN) 10 MG tablet Take 10 mg by mouth daily as needed for allergies.      Marland Kitchen meclizine (ANTIVERT) 25 MG tablet Take 25  mg by mouth 2 (two) times daily as needed for dizziness.        . naproxen sodium (ANAPROX) 220 MG tablet Take 440 mg by mouth daily as needed (pain).       Review Of Systems: 12 point ROS negative except as noted above in HPI.  Physical Exam: Filed Vitals:   05/29/14 1830  BP: 138/107  Pulse: 74  Temp:   Resp: 16    General: alert and cooperative HEENT: PERRLA and extra ocular movement intact Heart: S1, S2 normal, no murmur, rub or gallop, regular rate and rhythm Lungs: clear to auscultation, no wheezes or rales and unlabored breathing Abdomen: abdomen is soft without significant tenderness, masses, organomegaly or guarding Extremities: RLE mildly cool to touch, diminished pulses on palpation-[place] mildly intact, LLE generalized erythema and swelling  Skin:as above  Neurology: normal without focal findings  Labs and Imaging: Lab Results  Component Value Date/Time   NA 138 05/29/2014  4:16 PM   K 4.1 05/29/2014  4:16 PM   CL 98 05/29/2014  4:16 PM   CO2 26 05/29/2014  4:16 PM   BUN 31* 05/29/2014  4:16 PM   CREATININE 1.31* 05/29/2014  4:16 PM   CREATININE 1.19* 05/17/2014  4:53 PM   GLUCOSE 87 05/29/2014  4:16 PM   Lab Results  Component Value Date   WBC 17.9* 05/29/2014   HGB 11.6* 05/29/2014   HCT 35.1* 05/29/2014   MCV 85.0 05/29/2014   PLT 247 05/29/2014    Dg Foot Complete Left  05/29/2014   CLINICAL DATA:  Pain and swelling of the dorsal aspect of the left foot for the past several days.  EXAM: LEFT FOOT - COMPLETE 3+ VIEW  COMPARISON:  No priors.  FINDINGS: Soft tissue swelling dorsal to the metatarsals. No acute displaced fracture, subluxation or dislocation. Multifocal degenerative changes of osteoarthritis are noted throughout the DIP and PIP joints, as well as the midfoot and hindfoot.  IMPRESSION: 1. Soft tissue swelling along the dorsal aspect of the foot overlying the metatarsals. No underlying acute bony abnormality.   Electronically Signed   By: Vinnie Langton M.D.   On: 05/29/2014 17:43           Shanda Howells  MD  Pager: [phone]

## 2014-05-29 NOTE — ED Provider Notes (Signed)
CSN: [id number]     Arrival date & time 05/29/14  1333 History   First MD Initiated Contact with Patient 05/29/14 1439     Chief Complaint  Patient presents with  . Foot Swelling   (Consider location/radiation/quality/duration/timing/severity/associated sxs/prior Treatment) HPI Comments: 80 year old female is accompanied by her daughter-in-law today complaining of redness swelling and tenderness to the left foot and red streaks in the leg. She has had a wound to the left great toe for several months and has been followed by podiatry and PCP. 2 days ago she was seen in the emergency department for weakness, dehydration and fatigue. There was also a nodule on chest films indicating possible pneumonia as well as metastatic disease. She was placed on Levaquin. Despite 2 days of Levaquin in the saline this has progressed rapidly. Denies fever or chills.   Past Medical History  Diagnosis Date  . Osteoarthritis   . Hyperlipidemia   . GERD (gastroesophageal reflux disease)   . Peripheral neuropathy   . Osteopenia   . DDD (degenerative disc disease)   . Shingles   . Colon cancer 1977  . Thyroid cancer 1966  . Peripheral vascular disease   . Deep vein blood clot of left lower extremity 05/06/13   Past Surgical History  Procedure Laterality Date  . Thyroidectomy    . Total abdominal hysterectomy w/ bilateral salpingoophorectomy  1968  . Colon resection  1977  . Cervical laminectomy  1995  . Cataract extraction      bilateral  . Breast enhancement surgery      silicone  . Breast surgery    . Posterior cervical fusion/foraminotomy N/A 03/07/2013    Procedure: Cervical four -seven POSTERIOR CERVICAL FUSION;  Surgeon: Elaina Hoops, MD;  Location: [facility];  Service: Neurosurgery;  Laterality: N/A;   Family History  Problem Relation Age of Onset  . Lung cancer Father    History  Substance Use Topics  . Smoking status: Former Smoker -- 1.00 packs/day for 20 years    Quit date: 11/05/1972   . Smokeless tobacco: Never Used  . Alcohol Use: No   OB History   Grav Para Term Preterm Abortions TAB SAB Ect Mult Living                 Review of Systems  Constitutional: Positive for activity change. Negative for fever.  HENT: Negative.   Respiratory: Negative for cough and shortness of breath.   Cardiovascular: Negative for chest pain.  Gastrointestinal: Negative.   Skin: Positive for color change and wound.  Neurological: Positive for weakness. Negative for seizures, syncope, facial asymmetry and speech difficulty.    Allergies  Codeine and Oxybutynin  Home Medications   Prior to Admission medications   Medication Sig Start Date End Date Taking? Authorizing Provider  atorvastatin (LIPITOR) 10 MG tablet Take 5 mg by mouth at bedtime.   Yes Historical Provider, MD  cyclobenzaprine (FLEXERIL) 10 MG tablet Take 10 mg by mouth.   Yes Historical Provider, MD  gabapentin (NEURONTIN) 100 MG capsule Take 100 mg by mouth.   Yes Historical Provider, MD  levofloxacin (LEVAQUIN) 500 MG tablet Take 1 tablet (500 mg total) by mouth daily. 05/27/14  Yes Tanna Furry, MD  levothyroxine (SYNTHROID, LEVOTHROID) 75 MCG tablet Take 75 mcg by mouth daily.     Yes Historical Provider, MD  lisinopril (PRINIVIL,ZESTRIL) 20 MG tablet Take 1 tablet (20 mg total) by mouth daily. 04/10/13  Yes Ivan Anchors Love, PA-C  meloxicam (  MOBIC) 15 MG tablet Take 1 tablet (15 mg total) by mouth daily. For Pain and inflammation 11/26/13  Yes Unk Pinto, MD  methocarbamol (ROBAXIN) 500 MG tablet Take 500 mg by mouth.   Yes Historical Provider, MD  ondansetron (ZOFRAN ODT) 4 MG disintegrating tablet Take 1 tablet (4 mg total) by mouth every 8 (eight) hours as needed for nausea. 05/27/14  Yes Tanna Furry, MD  oxybutynin (DITROPAN-XL) 5 MG 24 hr tablet Take 5 mg by mouth at bedtime.   Yes Historical Provider, MD  solifenacin (VESICARE) 10 MG tablet Take by mouth daily.   Yes Historical Provider, MD  traMADol (ULTRAM) 50 MG  tablet Take 50 mg by mouth 3 (three) times daily as needed for moderate pain.   Yes Historical Provider, MD  aspirin EC 81 MG tablet Take 162 mg by mouth daily.    Historical Provider, MD  atenolol (TENORMIN) 50 MG tablet Take 25 mg by mouth daily. For BP 05/17/14 05/18/15  Unk Pinto, MD  Cholecalciferol (VITAMIN D3 HIGH POTENCY PO) Take 2,000-5,000 Units by mouth 2 (two) times daily. Take 5000 units in the morning and take 2000 units in the evening.    Historical Provider, MD  fish oil-omega-3 fatty acids 1000 MG capsule Take 1 g by mouth daily.     Historical Provider, MD  loratadine (CLARITIN) 10 MG tablet Take 10 mg by mouth daily as needed for allergies.    Historical Provider, MD  Magnesium 250 MG TABS Take 250 mg by mouth 2 (two) times daily.     Historical Provider, MD  meclizine (ANTIVERT) 25 MG tablet Take 25 mg by mouth 2 (two) times daily as needed for dizziness.     Historical Provider, MD  naproxen sodium (ANAPROX) 220 MG tablet Take 440 mg by mouth daily as needed (pain).    Historical Provider, MD   BP 127/74  Pulse 79  Temp(Src) 98.4 F (36.9 C) (Oral)  SpO2 99% Physical Exam  Nursing note and vitals reviewed. Constitutional: She is oriented to person, place, and time. She appears well-developed and well-nourished. No distress.  Neck: Normal range of motion.  Cardiovascular: Normal rate and regular rhythm.   Pulmonary/Chest: Effort normal. No respiratory distress.  Musculoskeletal: She exhibits edema and tenderness.  Neurological: She is alert and oriented to person, place, and time. She exhibits normal muscle tone.  Skin:  Edema, increased warmth and erythema to the Left foot. Overlying skin changes, wound to the great toe. Lymphangitis to the inner thigh.  R foot with purplish/cyanotic color with 4 sec cap refill. Unable to palpate pedal pulse. Cool .  Psychiatric: She has a normal mood and affect.    ED Course  Procedures (including critical care time) Labs  Review Labs Reviewed - No data to display  Imaging Review No results found.   MDM   1. Cellulitis of foot, left   2. Acute lymphangitis of extremity   3. Pulmonary nodule   4. PVD (peripheral vascular disease)     Transfer to [facility] for management of L foot cellulitis with lymphangitis failing tx with Levaquin.      Janne Napoleon, NP 05/29/14 [phone]

## 2014-05-29 NOTE — ED Notes (Signed)
Pt in from urgent care for evaluation for an infection to her left foot which is red and swollen- upon further assessment pt right foot is cold and blue, unable to palpate a pulse, family reports MD at urgent care was unable to get a pulse, unknown how long symptoms have been going on

## 2014-05-29 NOTE — ED Provider Notes (Signed)
Medical screening examination/treatment/procedure(s) were performed by a resident physician or non-physician practitioner and as the supervising physician I was immediately available for consultation/collaboration.  Linna Darner, MD Family Medicine   Waldemar Dickens, MD 05/29/14 [phone]

## 2014-05-29 NOTE — Consult Note (Signed)
Patient name: Cassandra Walker MRN: [id number] DOB: Jan 02, 1932 Sex: female   Referred by: [facility] hospitalist  Reason for referral:  Chief Complaint  Patient presents with  . Foot Problem    HISTORY OF PRESENT ILLNESS: Patient is a 80 year old female with long history of nonhealing ulcerations on the tip of her left great toe. She reports that this occurred around the time of cervical surgery one year ago has had persistent difficulty in healing this. She presents today with significant edema and swelling in her left leg. There was some question as far as arterial flow into her right leg and vascular surgery was consult and for further evaluation. She does not have any history of peripheral vascular occlusive disease. She has no history of cardiac disease does have a remote history of left leg DVT in July of 2014. She does report some discomfort associated with increased erythema and swelling in her left foot she reports this extends up into her leg as well.  Past Medical History  Diagnosis Date  . Osteoarthritis   . Hyperlipidemia   . GERD (gastroesophageal reflux disease)   . Peripheral neuropathy   . Osteopenia   . DDD (degenerative disc disease)   . Shingles   . Colon cancer 1977  . Thyroid cancer 1966  . Peripheral vascular disease   . Deep vein blood clot of left lower extremity 05/06/13    Past Surgical History  Procedure Laterality Date  . Thyroidectomy    . Total abdominal hysterectomy w/ bilateral salpingoophorectomy  1968  . Colon resection  1977  . Cervical laminectomy  1995  . Cataract extraction      bilateral  . Breast enhancement surgery      silicone  . Breast surgery    . Posterior cervical fusion/foraminotomy N/A 03/07/2013    Procedure: Cervical four -seven POSTERIOR CERVICAL FUSION;  Surgeon: Elaina Hoops, MD;  Location: [facility];  Service: Neurosurgery;  Laterality: N/A;    History   Social History  . Marital Status: Widowed    Spouse Name: N/A     Number of Children: 4  . Years of Education: N/A   Occupational History  . retired from [employer]  . Smoking status: Former Smoker -- 1.00 packs/day for 20 years    Quit date: 11/05/1972  . Smokeless tobacco: Never Used  . Alcohol Use: No  . Drug Use: Not on file  . Sexual Activity: Not on file   Other Topics Concern  . Not on file   Social History Narrative  . No narrative on file    Family History  Problem Relation Age of Onset  . Lung cancer Father     Allergies as of 05/29/2014 - Review Complete 05/29/2014  Allergen Reaction Noted  . Codeine Other (See Comments) 07/23/2011  . Oxybutynin  03/10/2014    No current facility-administered medications on file prior to encounter.   Current Outpatient Prescriptions on File Prior to Encounter  Medication Sig Dispense Refill  . atorvastatin (LIPITOR) 10 MG tablet Take 5 mg by mouth at bedtime.      . Cholecalciferol (VITAMIN D3 HIGH POTENCY PO) Take 2,000-5,000 Units by mouth 2 (two) times daily. Take 5000 units in the morning and take 2000 units in the evening.      . cyclobenzaprine (FLEXERIL) 10 MG tablet Take 5 mg by mouth 3 (three) times daily as needed for muscle spasms.       Marland Kitchen  fish oil-omega-3 fatty acids 1000 MG capsule Take 1 g by mouth daily.       Marland Kitchen gabapentin (NEURONTIN) 100 MG capsule Take 200 mg by mouth 3 (three) times daily.       Marland Kitchen levofloxacin (LEVAQUIN) 500 MG tablet Take 1 tablet (500 mg total) by mouth daily.  10 tablet  0  . levothyroxine (SYNTHROID, LEVOTHROID) 75 MCG tablet Take 75 mcg by mouth daily.        Marland Kitchen lisinopril (PRINIVIL,ZESTRIL) 20 MG tablet Take 1 tablet (20 mg total) by mouth daily.      . Magnesium 250 MG TABS Take 250 mg by mouth 2 (two) times daily.       . meloxicam (MOBIC) 15 MG tablet Take 1 tablet (15 mg total) by mouth daily. For Pain and inflammation  90 tablet  99  . methocarbamol (ROBAXIN) 500 MG tablet Take 250 mg by mouth 3 (three) times  daily.       . ondansetron (ZOFRAN ODT) 4 MG disintegrating tablet Take 1 tablet (4 mg total) by mouth every 8 (eight) hours as needed for nausea.  6 tablet  0  . oxybutynin (DITROPAN-XL) 5 MG 24 hr tablet Take 5 mg by mouth 2 (two) times daily.       . solifenacin (VESICARE) 10 MG tablet Take 10 mg by mouth daily.       . traMADol (ULTRAM) 50 MG tablet Take 50 mg by mouth 3 (three) times daily as needed for moderate pain.      Marland Kitchen loratadine (CLARITIN) 10 MG tablet Take 10 mg by mouth daily as needed for allergies.      Marland Kitchen meclizine (ANTIVERT) 25 MG tablet Take 25 mg by mouth 2 (two) times daily as needed for dizziness.       . naproxen sodium (ANAPROX) 220 MG tablet Take 440 mg by mouth daily as needed (pain).         REVIEW OF SYSTEMS: Reviewed in her history and physical with no further addition  PHYSICAL EXAMINATION:  General: The patient is a well-nourished female, in no acute distress. Vital signs are BP 162/53  Pulse 75  Temp(Src) 99.1 F (37.3 C) (Oral)  Resp 12  Ht 5\' 5"  (1.651 m)  Wt 132 lb 3.2 oz (59.966 kg)  BMI 22.00 kg/m2  SpO2 100% Pulmonary: There is a good air exchange   Musculoskeletal: There are no major deformities in her lower extremities. Marked arthritic changes in both hands Neurologic: No focal weakness or paresthesias are detected, Skin: Left leg is noted for marked erythema and swelling from her knee down into her foot. There are several areas of probable full-thickness skin loss with a darkened areas over the dorsum of her left foot. She does have dry callus on the tip of her left great toe which does not appear to be infected. Right leg without ulcerations Psychiatric: The patient has normal affect. Cardiovascular: Pulse status: 2+ radial pulses bilaterally. 2+ popliteal pulses bilaterally. 2+ left anterior tibial pulse at the ankle and 2+ right posterior tibial pulse Hand-held Doppler reveals a biphasic normal flow at the left anterior tibial and right  posterior tibial and peroneal arteries   Impression and Plan:  No evidence of lower extremity arterial insufficiency. Unusual picture on the left with her cellulitis. Discussed at length with patient and multiple family members present. No further vascular evaluation is warranted. Please call if we can offer any assistance     Halona Amstutz [facility] Office: [phone]

## 2014-05-30 LAB — CBC WITH DIFFERENTIAL/PLATELET
Basophils Absolute: 0 10*3/uL (ref 0.0–0.1)
Basophils Relative: 0 % (ref 0–1)
EOS PCT: 8 % — AB (ref 0–5)
Eosinophils Absolute: 0.9 10*3/uL — ABNORMAL HIGH (ref 0.0–0.7)
HEMATOCRIT: 32 % — AB (ref 36.0–46.0)
Hemoglobin: 10.4 g/dL — ABNORMAL LOW (ref 12.0–15.0)
LYMPHS ABS: 2 10*3/uL (ref 0.7–4.0)
LYMPHS PCT: 18 % (ref 12–46)
MCH: 27.7 pg (ref 26.0–34.0)
MCHC: 32.5 g/dL (ref 30.0–36.0)
MCV: 85.1 fL (ref 78.0–100.0)
MONO ABS: 1.3 10*3/uL — AB (ref 0.1–1.0)
Monocytes Relative: 12 % (ref 3–12)
Neutro Abs: 7.2 10*3/uL (ref 1.7–7.7)
Neutrophils Relative %: 62 % (ref 43–77)
Platelets: 254 10*3/uL (ref 150–400)
RBC: 3.76 MIL/uL — ABNORMAL LOW (ref 3.87–5.11)
RDW: 13.7 % (ref 11.5–15.5)
WBC: 11.5 10*3/uL — ABNORMAL HIGH (ref 4.0–10.5)

## 2014-05-30 LAB — COMPREHENSIVE METABOLIC PANEL
ALT: 16 U/L (ref 0–35)
AST: 18 U/L (ref 0–37)
Albumin: 2.4 g/dL — ABNORMAL LOW (ref 3.5–5.2)
Alkaline Phosphatase: 63 U/L (ref 39–117)
Anion gap: 11 (ref 5–15)
BUN: 20 mg/dL (ref 6–23)
CO2: 28 meq/L (ref 19–32)
Calcium: 8.4 mg/dL (ref 8.4–10.5)
Chloride: 107 mEq/L (ref 96–112)
Creatinine, Ser: 1.1 mg/dL (ref 0.50–1.10)
GFR, EST AFRICAN AMERICAN: 54 mL/min — AB (ref >90)
GFR, EST NON AFRICAN AMERICAN: 46 mL/min — AB (ref >90)
GLUCOSE: 87 mg/dL (ref 70–99)
POTASSIUM: 4.4 meq/L (ref 3.7–5.3)
SODIUM: 146 meq/L (ref 137–147)
TOTAL PROTEIN: 5.8 g/dL — AB (ref 6.0–8.3)
Total Bilirubin: 0.3 mg/dL (ref 0.3–1.2)

## 2014-05-30 LAB — HEMOGLOBIN A1C
HEMOGLOBIN A1C: 6.3 % — AB (ref <5.7)
Mean Plasma Glucose: 134 mg/dL — ABNORMAL HIGH (ref <117)

## 2014-05-30 LAB — IRON AND TIBC
Iron: 10 ug/dL — ABNORMAL LOW (ref 42–135)
Saturation Ratios: 5 % — ABNORMAL LOW (ref 20–55)
TIBC: 211 ug/dL — AB (ref 250–470)
UIBC: 201 ug/dL (ref 125–400)

## 2014-05-30 LAB — FOLATE: FOLATE: 18.9 ng/mL

## 2014-05-30 LAB — FERRITIN: Ferritin: 148 ng/mL (ref 10–291)

## 2014-05-30 LAB — VITAMIN B12: VITAMIN B 12: 448 pg/mL (ref 211–911)

## 2014-05-30 MED ORDER — TRAMADOL HCL 50 MG PO TABS
50.0000 mg | ORAL_TABLET | Freq: Three times a day (TID) | ORAL | Status: DC | PRN
  Administered 2014-06-01 – 2014-06-03 (×3): 50 mg via ORAL
  Filled 2014-05-30 (×3): qty 1

## 2014-05-30 MED ORDER — CYCLOBENZAPRINE HCL 10 MG PO TABS: 5.0000 mg | ORAL_TABLET | Freq: Three times a day (TID) | ORAL | Status: DC | PRN

## 2014-05-30 MED ORDER — BACITRACIN ZINC 500 UNIT/GM EX OINT
TOPICAL_OINTMENT | Freq: Every day | CUTANEOUS | Status: DC
  Administered 2014-05-30 – 2014-05-31 (×2): 1 via TOPICAL
  Administered 2014-06-01 – 2014-06-03 (×3): via TOPICAL
  Filled 2014-05-30: qty 28.35

## 2014-05-30 MED ORDER — VANCOMYCIN HCL IN DEXTROSE 1-5 GM/200ML-% IV SOLN
1000.0000 mg | INTRAVENOUS | Status: DC
  Administered 2014-05-30: 1000 mg via INTRAVENOUS
  Filled 2014-05-30 (×2): qty 200

## 2014-05-30 MED ORDER — CEFAZOLIN SODIUM 1-5 GM-% IV SOLN
1.0000 g | Freq: Three times a day (TID) | INTRAVENOUS | Status: DC
  Administered 2014-05-30 – 2014-05-31 (×4): 1 g via INTRAVENOUS
  Filled 2014-05-30 (×6): qty 50

## 2014-05-30 NOTE — Consult Note (Signed)
WOC wound consult note Reason for Consult: Left foot (anterior aspect) with edema, discoloration.  Vascular consult has already been performed. Assess for topical recommendations, suggestions for care Wound type:infectious vs traumatic Pressure Ulcer POA: No Measurement: 12cm x 10cm area of erythema, edema and discoloration (purple islands) with a concentrated area of ecchymosis measuring 4cm x 5cm in the central area.  No open areas/wounds.  The largest ecchymotic area measures 2cm x 1cm.  There is a previously noted dry callous formation on the left great toe (LGT) measuring 1cm round with no depth, only hyperkeratotic tissue Wound bed:As described above.  No open wounds. Drainage (amount, consistency, odor) None Periwound: As noted above. Dressing procedure/placement/frequency: I will add to the POC a nursing order to apply bacitracin ointment twice daily to the left great toe callous.  Additionally, I will add a daily dressing of a single layer of xeroform gauze (for its antimicrobial and astringent properties) to the foot. I have no further suggestions.  Patient notes that edema is improving and erythema is diminishing as well. [facility] nursing team will not follow, but will remain available to this patient, the nursing and medical teams.  Please re-consult if needed. Thanks, Maudie Flakes, MSN, RN, [place], [place], [place] [phone])

## 2014-05-30 NOTE — Evaluation (Signed)
Occupational Therapy Evaluation Patient Details Name: Cassandra Walker MRN: [id number] DOB: 11-17-1931 Today's Date: 05/30/2014    History of Present Illness Patient is a 80 yo female admitted 05/29/14 with cellulitis LLE.  PMH:  HTN, LLE DVT, chronic LLE wound, anemia, AKI, DM, peripheral neuropathy, cervical fx's s/p C 4-7 fusion - residual weakness esp hands..   Clinical Impression   Pt admitted with above. Feel pt will benefit from acute OT to increase strength and independence prior to d/c. Pt states she is going to Outpatient OT and recommend she continue going.     Follow Up Recommendations  Outpatient OT    Equipment Recommendations  None recommended by OT    Recommendations for Other Services       Precautions / Restrictions Precautions Precautions: Fall Restrictions Weight Bearing Restrictions: No      Mobility Bed Mobility          General bed mobility comments: not assessed  Transfers Overall transfer level: Needs assistance Equipment used: Rolling walker (2 wheeled) Transfers: Sit to/from Stand Sit to Stand: Min guard;Min assist         General transfer comment: assist to rise from chair on one occassion.    Balance                                            ADL Overall ADL's : Needs assistance/impaired     Grooming: Wash/dry face;Oral care;Set up;Supervision/safety;Standing               Lower Body Dressing: Moderate assistance;Sitting/lateral leans;With adaptive equipment (socks)   Toilet Transfer: Min guard;Minimal assistance;Ambulation;RW (chair; Min guard/Min A for sit to stand transfer)           Functional mobility during ADLs: Min guard;Rolling walker General ADL Comments: OT assisted in opening toothpaste. Pt reports she has difficulty opening containers at home even with jar opener. Pt has theraputty at home. Pt practiced donning/doffing socks with AE. OT educated on how to use reacher to don LB clothing.  Educated on use of bag on walker. Elevated LE's at end of session on pillows. Pt squeezing washcloth to wash face and OT told her this was good to help with hand strength.      Vision                     Perception     Praxis      Pertinent Vitals/Pain No pain reported.      Hand Dominance     Extremity/Trunk Assessment Upper Extremity Assessment Upper Extremity Assessment: RUE deficits/detail;LUE deficits/detail RUE Deficits / Details: weak grip strength-prior cervical surgery LUE Deficits / Details: missing first digit; weakness in grip strength-prior cervical surgery   Lower Extremity Assessment Lower Extremity Assessment: Defer to PT evaluation LLE Deficits / Details: Edema and reddness lower leg and foot.  Weakness noted LLE. LLE Sensation: history of peripheral neuropathy   Cervical / Trunk Assessment Cervical / Trunk Assessment: Kyphotic;Other exceptions Cervical / Trunk Exceptions: Cervical fusion - decreased ROM.  Holds head downward during gait   Communication Communication Communication: No difficulties   Cognition Arousal/Alertness: Awake/alert Behavior During Therapy: WFL for tasks assessed/performed Overall Cognitive Status: Within Functional Limits for tasks assessed                     General Comments  Exercises       Shoulder Instructions      Home Living Family/patient expects to be discharged to:: Private residence Living Arrangements: Alone Available Help at Discharge: Family;Available PRN/intermittently Type of Home: House Home Access: Ramped entrance     Home Layout: One level     Bathroom Shower/Tub: Teacher, early years/pre: Standard     Home Equipment: Environmental consultant - 2 wheels;Wheelchair - manual;Shower seat;Grab bars - toilet          Prior Functioning/Environment Level of Independence: Independent with assistive device(s);Needs assistance  Gait / Transfers Assistance Needed: Uses RW for  ambulation ADL's / Homemaking Assistance Needed: Daughter and son assist with meals.  Granddaughter assists with bathing and LB dressing.        OT Diagnosis: Generalized weakness.   OT Problem List: Decreased knowledge of use of DME or AE;Decreased knowledge of precautions;Decreased strength;Decreased activity tolerance;Impaired balance (sitting and/or standing)   OT Treatment/Interventions: Self-care/ADL training;DME and/or AE instruction;Therapeutic activities;Patient/family education;Balance training;Therapeutic exercise    OT Goals(Current goals can be found in the care plan section) Acute Rehab OT Goals Patient Stated Goal: 1626 OT Goal Formulation: With patient Time For Goal Achievement: 06/06/14 Potential to Achieve Goals: Good ADL Goals Pt Will Perform Lower Body Dressing: with set-up;with adaptive equipment;sit to/from stand Pt Will Transfer to Toilet: with modified independence;ambulating;grab bars;regular height toilet Pt Will Perform Toileting - Clothing Manipulation and hygiene: with modified independence;sit to/from stand Additional ADL Goal #1: Pt will be independent with HEP for bilateral hands to increase strength.  OT Frequency: Min 2X/week   Barriers to D/C:            Co-evaluation              End of Session Equipment Utilized During Treatment: Gait belt;Rolling walker  Activity Tolerance: Patient tolerated treatment well Patient left: in chair;with call bell/phone within reach;with family/visitor present   Time: 4103-0131 OT Time Calculation (min): 26 min Charges:  OT General Charges $OT Visit: 1 Procedure OT Evaluation $Initial OT Evaluation Tier I: 1 Procedure OT Treatments $Self Care/Home Management : 8-22 mins G-CodesBenito Mccreedy OTR/L [phone] 05/30/2014, 5:16 PM

## 2014-05-30 NOTE — Evaluation (Signed)
**Note De-Identified Cassandra Obfuscation** Physical Therapy Evaluation Patient Details Name: Cassandra Walker MRN: [id number] DOB: [birth year] Today's Date: 05/30/2014   History of Present Illness  Patient is a 80 yo female admitted 05/29/14 with cellulitis LLE.  PMH:  HTN, LLE DVT, chronic LLE wound, anemia, AKI, DM, peripheral neuropathy, cervical fx's s/p C 4-7 fusion - residual weakness esp hands..  Clinical Impression  Patient presents with problems listed below.  Patient able to ambulate 59' with RW and supervision today.  Per patient/family - close to baseline.  Patient receiving OP PT pta.  Recommend patient continue these services.  Will continue PT services acutely while in hospital.    Follow Up Recommendations Outpatient PT;Supervision - Intermittent (Patient was receiving OP PT pta.  Recommend continue.)    Equipment Recommendations  None recommended by PT    Recommendations for Other Services       Precautions / Restrictions Precautions Precautions: Fall Restrictions Weight Bearing Restrictions: No      Mobility  Bed Mobility Overal bed mobility: Modified Independent             General bed mobility comments: Uses bed rail.  Increased time for mobility.  Transfers Overall transfer level: Needs assistance Equipment used: Rolling walker (2 wheeled) Transfers: Sit to/from Stand Sit to Stand: Supervision         General transfer comment: Verbal cues for hand placement.  Assist for safety/balance during transfers.  Ambulation/Gait Ambulation/Gait assistance: Supervision Ambulation Distance (Feet): 40 Feet Assistive device: Rolling walker (2 wheeled) Gait Pattern/deviations: Step-through pattern;Decreased stride length;Trunk flexed Gait velocity: Decreased Gait velocity interpretation: Below normal speed for age/gender General Gait Details: Patient demonstrates safe use of RW.  Assist for balance/safety.  Stairs            Wheelchair Mobility    Modified Rankin (Stroke Patients Only)        Balance                                             Pertinent Vitals/Pain     Home Living Family/patient expects to be discharged to:: Private residence Living Arrangements: Alone Available Help at Discharge: Family;Available PRN/intermittently Type of Home: House Home Access: Ramped entrance     Home Layout: One level Home Equipment: Walker - 2 wheels;Wheelchair - manual;Shower seat      Prior Function Level of Independence: Independent with assistive device(s);Needs assistance   Gait / Transfers Assistance Needed: Uses RW for ambulation  ADL's / Homemaking Assistance Needed: Daughter and son assist with meals.  Granddaughter assists with bathing        Hand Dominance        Extremity/Trunk Assessment   Upper Extremity Assessment: Generalized weakness (Decreased hand strength and ROM)           Lower Extremity Assessment: Generalized weakness;LLE deficits/detail   LLE Deficits / Details: Edema and reddness lower leg and foot.  Weakness noted LLE.  Cervical / Trunk Assessment: Kyphotic;Other exceptions  Communication   Communication: No difficulties  Cognition Arousal/Alertness: Awake/alert Behavior During Therapy: WFL for tasks assessed/performed Overall Cognitive Status: Within Functional Limits for tasks assessed                      General Comments      Exercises        Assessment/Plan    PT Assessment Patient needs continued PT  services  PT Diagnosis Difficulty walking;Generalized weakness   PT Problem List Decreased strength;Decreased balance;Decreased mobility;Impaired sensation  PT Treatment Interventions DME instruction;Gait training;Functional mobility training;Therapeutic activities;Therapeutic exercise;Patient/family education   PT Goals (Current goals can be found in the Care Plan section) Acute Rehab PT Goals Patient Stated Goal: To go home PT Goal Formulation: With patient/family Time For Goal  Achievement: 06/06/14 Potential to Achieve Goals: Good    Frequency Min 3X/week   Barriers to discharge Decreased caregiver support Patient lives alone.  Family assists prn (daily).    Co-evaluation               End of Session Equipment Utilized During Treatment: Gait belt Activity Tolerance: Patient tolerated treatment well Patient left: in chair;with call bell/phone within reach;with family/visitor present Nurse Communication: Mobility status         Time: 1324-1350 PT Time Calculation (min): 26 min   Charges:   PT Evaluation $Initial PT Evaluation Tier I: 1 Procedure PT Treatments $Gait Training: 8-22 mins   PT G Codes:          Cassandra Walker 05/30/2014, 2:41 PM Cassandra Walker. Cassandra Walker, Cassandra Walker Pager [phone]
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
## 2014-05-30 NOTE — ED Provider Notes (Signed)
Medical screening examination/treatment/procedure(s) were conducted as a shared visit with non-physician practitioner(s) and myself.  I personally evaluated the patient during the encounter.   EKG Interpretation None      Pt with cellulitis of left leg, will admit for IV abx.  Vascular consult for possible ischemia of right.  Malvin Johns, MD 05/30/14 [phone]

## 2014-05-31 ENCOUNTER — Ambulatory Visit: Payer: Self-pay | Admitting: Physical Therapy

## 2014-05-31 ENCOUNTER — Encounter: Payer: Self-pay | Admitting: Occupational Therapy

## 2014-05-31 DIAGNOSIS — I82409 Acute embolism and thrombosis of unspecified deep veins of unspecified lower extremity: Secondary | ICD-10-CM

## 2014-05-31 DIAGNOSIS — M7989 Other specified soft tissue disorders: Secondary | ICD-10-CM

## 2014-05-31 LAB — CBC WITH DIFFERENTIAL/PLATELET
BASOS ABS: 0 10*3/uL (ref 0.0–0.1)
Basophils Relative: 0 % (ref 0–1)
EOS ABS: 1.1 10*3/uL — AB (ref 0.0–0.7)
Eosinophils Relative: 10 % — ABNORMAL HIGH (ref 0–5)
HEMATOCRIT: 31.1 % — AB (ref 36.0–46.0)
Hemoglobin: 10.2 g/dL — ABNORMAL LOW (ref 12.0–15.0)
Lymphocytes Relative: 22 % (ref 12–46)
Lymphs Abs: 2.5 10*3/uL (ref 0.7–4.0)
MCH: 27.8 pg (ref 26.0–34.0)
MCHC: 32.8 g/dL (ref 30.0–36.0)
MCV: 84.7 fL (ref 78.0–100.0)
MONO ABS: 1 10*3/uL (ref 0.1–1.0)
Monocytes Relative: 9 % (ref 3–12)
Neutro Abs: 6.8 10*3/uL (ref 1.7–7.7)
Neutrophils Relative %: 59 % (ref 43–77)
Platelets: 275 10*3/uL (ref 150–400)
RBC: 3.67 MIL/uL — ABNORMAL LOW (ref 3.87–5.11)
RDW: 13.8 % (ref 11.5–15.5)
WBC: 11.4 10*3/uL — ABNORMAL HIGH (ref 4.0–10.5)

## 2014-05-31 LAB — COMPREHENSIVE METABOLIC PANEL
ALBUMIN: 2.1 g/dL — AB (ref 3.5–5.2)
ALK PHOS: 55 U/L (ref 39–117)
ALT: 21 U/L (ref 0–35)
ANION GAP: 11 (ref 5–15)
AST: 26 U/L (ref 0–37)
BUN: 15 mg/dL (ref 6–23)
CHLORIDE: 107 meq/L (ref 96–112)
CO2: 26 mEq/L (ref 19–32)
Calcium: 8.1 mg/dL — ABNORMAL LOW (ref 8.4–10.5)
Creatinine, Ser: 0.95 mg/dL (ref 0.50–1.10)
GFR calc Af Amer: 64 mL/min — ABNORMAL LOW (ref >90)
GFR calc non Af Amer: 55 mL/min — ABNORMAL LOW (ref >90)
Glucose, Bld: 111 mg/dL — ABNORMAL HIGH (ref 70–99)
POTASSIUM: 3.7 meq/L (ref 3.7–5.3)
Sodium: 144 mEq/L (ref 137–147)
Total Protein: 5.4 g/dL — ABNORMAL LOW (ref 6.0–8.3)

## 2014-05-31 MED ORDER — DOXYCYCLINE HYCLATE 100 MG PO TABS
100.0000 mg | ORAL_TABLET | Freq: Two times a day (BID) | ORAL | Status: DC
  Administered 2014-05-31 – 2014-06-01 (×2): 100 mg via ORAL
  Filled 2014-05-31 (×4): qty 1

## 2014-05-31 MED ORDER — RIVAROXABAN 20 MG PO TABS: 20.0000 mg | ORAL_TABLET | Freq: Every day | ORAL | Status: DC

## 2014-05-31 MED ORDER — ATENOLOL 25 MG PO TABS
37.5000 mg | ORAL_TABLET | Freq: Two times a day (BID) | ORAL | Status: DC
  Administered 2014-05-31 – 2014-06-03 (×7): 37.5 mg via ORAL
  Filled 2014-05-31 (×9): qty 1

## 2014-05-31 MED ORDER — HYDRALAZINE HCL 10 MG PO TABS
10.0000 mg | ORAL_TABLET | Freq: Four times a day (QID) | ORAL | Status: DC | PRN
Start: 2014-05-31 — End: 2014-06-03
  Administered 2014-05-31 – 2014-06-01 (×2): 10 mg via ORAL
  Filled 2014-05-31 (×3): qty 1

## 2014-05-31 MED ORDER — RIVAROXABAN 15 MG PO TABS
15.0000 mg | ORAL_TABLET | Freq: Two times a day (BID) | ORAL | Status: DC
  Administered 2014-06-01 – 2014-06-03 (×5): 15 mg via ORAL
  Filled 2014-05-31 (×8): qty 1

## 2014-05-31 MED ORDER — CEFAZOLIN SODIUM 1-5 GM-% IV SOLN
1.0000 g | Freq: Three times a day (TID) | INTRAVENOUS | Status: AC
  Administered 2014-05-31: 1 g via INTRAVENOUS
  Filled 2014-05-31: qty 50

## 2014-05-31 NOTE — Progress Notes (Signed)
Bilateral lower extremity venous duplex completed.  Right:  No evidence of DVT, superficial thrombosis, or Baker's cyst.  Left: DVT noted in the common femoral vein, proximal profunda, and proximal femoral veins.  No evidence of superficial thrombosis.  No Baker's cyst.

## 2014-05-31 NOTE — Progress Notes (Signed)
ANTICOAGULATION CONSULT NOTE - Initial Consult  Pharmacy Consult for xarelto  Indication: DVT  Allergies  Allergen Reactions  . Codeine Other (See Comments)    Jittery, nervous & hallucinations  . Oxybutynin     Tired    Patient Measurements: Height: 5\' 5"  (165.1 cm) Weight: 136 lb 11 oz (62 kg) IBW/kg (Calculated) : 57 Heparin Dosing Weight:   Vital Signs: Temp: 98.1 F (36.7 C) (07/27 1404) Temp src: Oral (07/27 1404) BP: 135/92 mmHg (07/27 1404) Pulse Rate: 70 (07/27 1404)  Labs:  Recent Labs  05/29/14 1915 05/30/14 0533 05/31/14 0510  HGB 10.3* 10.4* 10.2*  HCT 30.9* 32.0* 31.1*  PLT 229 254 275  CREATININE 1.20* 1.10 0.95    Estimated Creatinine Clearance: 43.2 ml/min (by C-G formula based on Cr of 0.95).   Medical History: Past Medical History  Diagnosis Date  . Osteoarthritis   . Hyperlipidemia   . GERD (gastroesophageal reflux disease)   . Peripheral neuropathy   . Osteopenia   . DDD (degenerative disc disease)   . Shingles   . Colon cancer 1977  . Thyroid cancer 1966  . Peripheral vascular disease   . Deep vein blood clot of left lower extremity 05/06/13    Medications:  Prescriptions prior to admission  Medication Sig Dispense Refill  . atenolol (TENORMIN) 25 MG tablet Take 25 mg by mouth 2 (two) times daily.      Marland Kitchen atorvastatin (LIPITOR) 10 MG tablet Take 5 mg by mouth at bedtime.      . Cholecalciferol (VITAMIN D3 HIGH POTENCY PO) Take 2,000-5,000 Units by mouth 2 (two) times daily. Take 5000 units in the morning and take 2000 units in the evening.      . cyclobenzaprine (FLEXERIL) 10 MG tablet Take 5 mg by mouth 3 (three) times daily as needed for muscle spasms.       . fish oil-omega-3 fatty acids 1000 MG capsule Take 1 g by mouth daily.       Marland Kitchen gabapentin (NEURONTIN) 100 MG capsule Take 200 mg by mouth 3 (three) times daily.       Marland Kitchen levofloxacin (LEVAQUIN) 500 MG tablet Take 1 tablet (500 mg total) by mouth daily.  10 tablet  0  .  levothyroxine (SYNTHROID, LEVOTHROID) 75 MCG tablet Take 75 mcg by mouth daily.        Marland Kitchen lisinopril (PRINIVIL,ZESTRIL) 20 MG tablet Take 1 tablet (20 mg total) by mouth daily.      . Magnesium 250 MG TABS Take 250 mg by mouth 2 (two) times daily.       . meloxicam (MOBIC) 15 MG tablet Take 1 tablet (15 mg total) by mouth daily. For Pain and inflammation  90 tablet  99  . methocarbamol (ROBAXIN) 500 MG tablet Take 250 mg by mouth 3 (three) times daily.       . ondansetron (ZOFRAN ODT) 4 MG disintegrating tablet Take 1 tablet (4 mg total) by mouth every 8 (eight) hours as needed for nausea.  6 tablet  0  . oxybutynin (DITROPAN-XL) 5 MG 24 hr tablet Take 5 mg by mouth 2 (two) times daily.       . solifenacin (VESICARE) 10 MG tablet Take 10 mg by mouth daily.       . traMADol (ULTRAM) 50 MG tablet Take 50 mg by mouth 3 (three) times daily as needed for moderate pain.      Marland Kitchen loratadine (CLARITIN) 10 MG tablet Take 10 mg by mouth daily  as needed for allergies.      Marland Kitchen meclizine (ANTIVERT) 25 MG tablet Take 25 mg by mouth 2 (two) times daily as needed for dizziness.       . naproxen sodium (ANAPROX) 220 MG tablet Take 440 mg by mouth daily as needed (pain).       Scheduled:  . atenolol  37.5 mg Oral BID  . atorvastatin  5 mg Oral QHS  . bacitracin   Topical Daily  .  ceFAZolin (ANCEF) IV  1 g Intravenous 3 times per day  . cholecalciferol  5,000 Units Oral BID  . darifenacin  15 mg Oral Daily  . doxycycline  100 mg Oral Q12H  . gabapentin  200 mg Oral TID  . heparin  5,000 Units Subcutaneous 3 times per day  . levothyroxine  75 mcg Oral QAC breakfast  . lisinopril  20 mg Oral Daily  . magnesium oxide  200 mg Oral BID  . methocarbamol  250 mg Oral TID  . oxybutynin  5 mg Oral BID  . sodium chloride  3 mL Intravenous Q12H   Infusions:  . sodium chloride 100 mL/hr at 05/31/14 0307    Assessment: 80 yo who has been here cellulitis. She has been on abx. A doppler was done today and showed a  left DVT in the femoral vein. Xarelto has been ordered for anticoagulation. H/H only slightly low at baseline. Plt ok.   Goal of Therapy:   Monitor platelets by anticoagulation protocol: Yes   Plan:   Xarelto 15mg  PO BID x 21 days then 20mg  PO qday Monitor CBC

## 2014-05-31 NOTE — Progress Notes (Signed)
Clarification needed for vascular study that was ordered 05/29/2014. If any vascular exam is still needed please reorder with appropriate indication. Call [phone] with any questions/concerns.  05/31/2014 2:21 PM Maudry Mayhew, RVT, RDCS, RDMS

## 2014-05-31 NOTE — Progress Notes (Signed)
Physical Therapy Treatment Patient Details Name: AFTYN NOTT MRN: [id number] DOB: [birth year] Today's Date: 06-11-14    History of Present Illness Patient is a 80 yo female admitted 05/29/14 with cellulitis LLE.  PMH:  HTN, LLE DVT, chronic LLE wound, anemia, AKI, DM, peripheral neuropathy, cervical fx's s/p C 4-7 fusion - residual weakness esp hands..    PT Comments    Patient making good progress with mobility/gait.  Follow Up Recommendations  Outpatient PT;Supervision - Intermittent (Patient was receiving OP PT pta.  Recommend continue.)     Equipment Recommendations  None recommended by PT    Recommendations for Other Services       Precautions / Restrictions Precautions Precautions: Fall Restrictions Weight Bearing Restrictions: No    Mobility  Bed Mobility Overal bed mobility: Modified Independent                Transfers Overall transfer level: Needs assistance Equipment used: Rolling walker (2 wheeled) Transfers: Sit to/from Stand Sit to Stand: Supervision         General transfer comment: Patient uses correct hand placement.  Supervision for balance only.  Ambulation/Gait Ambulation/Gait assistance: Supervision Ambulation Distance (Feet): 120 Feet Assistive device: Rolling walker (2 wheeled) Gait Pattern/deviations: Step-through pattern;Decreased stride length;Trunk flexed Gait velocity: Decreased Gait velocity interpretation: Below normal speed for age/gender General Gait Details: Patient demonstrates safe use of RW.  Assist for balance/safety.   Stairs            Wheelchair Mobility    Modified Rankin (Stroke Patients Only)       Balance                                    Cognition Arousal/Alertness: Awake/alert Behavior During Therapy: WFL for tasks assessed/performed Overall Cognitive Status: Within Functional Limits for tasks assessed                      Exercises General Exercises - Lower  Extremity Ankle Circles/Pumps: AROM;Both;10 reps;Seated Long Arc Quad: AROM;Both;10 reps;Seated Hip ABduction/ADduction: AROM;Both;10 reps;Seated Hip Flexion/Marching: AROM;Both;10 reps;Seated    General Comments        Pertinent Vitals/Pain     Home Living                      Prior Function            PT Goals (current goals can now be found in the care plan section) Progress towards PT goals: Progressing toward goals    Frequency  Min 3X/week    PT Plan Current plan remains appropriate    Co-evaluation             End of Session Equipment Utilized During Treatment: Gait belt Activity Tolerance: Patient tolerated treatment well Patient left: in chair;with call bell/phone within reach;with family/visitor present     Time: 9417-4081 PT Time Calculation (min): 24 min  Charges:  $Gait Training: 8-22 mins $Therapeutic Exercise: 8-22 mins                    G Codes:      Despina Pole Jun 11, 2014, 12:44 PM Carita Pian. Sanjuana Kava, [place] Pager [phone]
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
## 2014-06-01 LAB — COMPREHENSIVE METABOLIC PANEL
ALT: 19 U/L (ref 0–35)
ANION GAP: 12 (ref 5–15)
AST: 25 U/L (ref 0–37)
Albumin: 2.4 g/dL — ABNORMAL LOW (ref 3.5–5.2)
Alkaline Phosphatase: 62 U/L (ref 39–117)
BILIRUBIN TOTAL: 0.2 mg/dL — AB (ref 0.3–1.2)
BUN: 11 mg/dL (ref 6–23)
CHLORIDE: 109 meq/L (ref 96–112)
CO2: 27 mEq/L (ref 19–32)
CREATININE: 0.91 mg/dL (ref 0.50–1.10)
Calcium: 8.5 mg/dL (ref 8.4–10.5)
GFR calc Af Amer: 68 mL/min — ABNORMAL LOW (ref >90)
GFR calc non Af Amer: 58 mL/min — ABNORMAL LOW (ref >90)
Glucose, Bld: 109 mg/dL — ABNORMAL HIGH (ref 70–99)
Potassium: 3.4 mEq/L — ABNORMAL LOW (ref 3.7–5.3)
Sodium: 148 mEq/L — ABNORMAL HIGH (ref 137–147)
Total Protein: 6.2 g/dL (ref 6.0–8.3)

## 2014-06-01 LAB — CBC WITH DIFFERENTIAL/PLATELET
Basophils Absolute: 0 10*3/uL (ref 0.0–0.1)
Basophils Relative: 0 % (ref 0–1)
Eosinophils Absolute: 1.3 10*3/uL — ABNORMAL HIGH (ref 0.0–0.7)
Eosinophils Relative: 10 % — ABNORMAL HIGH (ref 0–5)
HEMATOCRIT: 31.6 % — AB (ref 36.0–46.0)
Hemoglobin: 10.2 g/dL — ABNORMAL LOW (ref 12.0–15.0)
LYMPHS ABS: 2.5 10*3/uL (ref 0.7–4.0)
LYMPHS PCT: 20 % (ref 12–46)
MCH: 27.3 pg (ref 26.0–34.0)
MCHC: 32.3 g/dL (ref 30.0–36.0)
MCV: 84.7 fL (ref 78.0–100.0)
Monocytes Absolute: 1.3 10*3/uL — ABNORMAL HIGH (ref 0.1–1.0)
Monocytes Relative: 11 % (ref 3–12)
NEUTROS ABS: 7.4 10*3/uL (ref 1.7–7.7)
Neutrophils Relative %: 59 % (ref 43–77)
PLATELETS: 323 10*3/uL (ref 150–400)
RBC: 3.73 MIL/uL — AB (ref 3.87–5.11)
RDW: 13.7 % (ref 11.5–15.5)
WBC: 12.4 10*3/uL — ABNORMAL HIGH (ref 4.0–10.5)

## 2014-06-01 MED ORDER — CLONIDINE HCL 0.1 MG PO TABS
0.1000 mg | ORAL_TABLET | Freq: Three times a day (TID) | ORAL | Status: DC
  Administered 2014-06-01 – 2014-06-02 (×4): 0.1 mg via ORAL
  Filled 2014-06-01 (×6): qty 1

## 2014-06-01 MED ORDER — AMOXICILLIN-POT CLAVULANATE 500-125 MG PO TABS
1.0000 | ORAL_TABLET | Freq: Three times a day (TID) | ORAL | Status: DC
  Administered 2014-06-01 – 2014-06-03 (×6): 500 mg via ORAL
  Filled 2014-06-01 (×9): qty 1

## 2014-06-01 MED ORDER — POTASSIUM CHLORIDE CRYS ER 20 MEQ PO TBCR
40.0000 meq | EXTENDED_RELEASE_TABLET | ORAL | Status: AC
  Administered 2014-06-01 (×2): 40 meq via ORAL
  Filled 2014-06-01 (×2): qty 2

## 2014-06-01 MED ORDER — DEXTROSE 5 % IV SOLN
INTRAVENOUS | Status: AC
  Administered 2014-06-01 – 2014-06-02 (×2): via INTRAVENOUS

## 2014-06-01 MED ORDER — HYDRALAZINE HCL 50 MG PO TABS
50.0000 mg | ORAL_TABLET | Freq: Three times a day (TID) | ORAL | Status: DC
  Administered 2014-06-01 – 2014-06-03 (×6): 50 mg via ORAL
  Filled 2014-06-01 (×9): qty 1

## 2014-06-01 MED ORDER — CLONIDINE HCL 0.2 MG PO TABS: 0.2000 mg | ORAL_TABLET | Freq: Three times a day (TID) | ORAL | Status: DC

## 2014-06-01 MED ORDER — VANCOMYCIN HCL IN DEXTROSE 1-5 GM/200ML-% IV SOLN
1000.0000 mg | INTRAVENOUS | Status: DC
  Administered 2014-06-01 – 2014-06-02 (×2): 1000 mg via INTRAVENOUS
  Filled 2014-06-01 (×3): qty 200

## 2014-06-01 MED ORDER — CLONIDINE HCL 0.1 MG PO TABS
0.1000 mg | ORAL_TABLET | Freq: Three times a day (TID) | ORAL | Status: DC | PRN
  Administered 2014-06-01: 0.1 mg via ORAL
  Filled 2014-06-01: qty 1

## 2014-06-01 NOTE — Care Management Note (Signed)
    Page 1 of 1   06/03/2014     4:07:30 PM CARE MANAGEMENT NOTE 06/03/2014  Patient:  Cassandra Walker, Cassandra Walker   Account Number:  [id number]  Date Initiated:  06/01/2014  Documentation initiated by:  Merik Mignano  Subjective/Objective Assessment:   Pt adm on 05/29/14 with cellulitis Lt foot, DVT.  PTA, pt independent of ADLS.     Action/Plan:   Pt to dc on Xarelto.  Will check copay info.  Pt will also need HH at dc for wound care.  +1   Anticipated DC Date:  06/02/2014   Anticipated DC Plan:  [place]  CM consult  Medication Assistance      Choice offered to / List presented to:             Status of service:  Completed, signed off Medicare Important Message given?  YES (If response is "NO", the following Medicare IM given date fields will be blank) Date Medicare IM given:  06/01/2014 Medicare IM given by:  Joakim Huesman Date Additional Medicare IM given:   Additional Medicare IM given by:    Discharge Disposition:  [place]  Per UR Regulation:  Reviewed for med. necessity/level of care/duration of stay  If discussed at [place] of Stay Meetings, dates discussed:   06/03/2014    Comments:  06/03/14 Ellan Lambert, RN, BSN [phone] Pt for dc home today with [facility] services.  Notified [facility] of dc date.  06/01/14 Ellan Lambert RN, BSN [phone]  Per insurance co, pt's copay for 15mg  dose of Xarelto will be $139.50; 20mg  dose will be $95.21.  (30 day supply) The high cost is due to the fact that this med will put pt in the coverage gap or "donut hole" for her insurance. Will discuss with pt to see if she can afford this high copay.

## 2014-06-01 NOTE — Progress Notes (Signed)
MD paged and aware, Pt's BP still elevated after am scheduled medication and PRN BP meds, see MAR. New orders given. Will continue to monitor.

## 2014-06-01 NOTE — Progress Notes (Signed)
MD paged and aware of pt's elevated BP. New orders given and activated. Will continue to monitor.

## 2014-06-01 NOTE — Progress Notes (Signed)
ANTIBIOTIC CONSULT NOTE - FOLLOW UP  Pharmacy Consult for vancomycin Indication: cellulitis  Allergies  Allergen Reactions  . Codeine Other (See Comments)    Jittery, nervous & hallucinations  . Oxybutynin     Tired    Patient Measurements: Height: 5\' 5"  (165.1 cm) Weight: 136 lb 0.4 oz (61.7 kg) IBW/kg (Calculated) : 57   Vital Signs: Temp: 97.8 F (36.6 C) (07/28 1242) Temp src: Oral (07/28 1242) BP: 178/77 mmHg (07/28 1242) Pulse Rate: 68 (07/28 1242) Intake/Output from previous day: 07/27 0701 - 07/28 0700 In: 240 [P.O.:240] Out: -  Intake/Output from this shift: Total I/O In: 120 [P.O.:120] Out: -   Labs:  Recent Labs  05/30/14 0533 05/31/14 0510 06/01/14 0500  WBC 11.5* 11.4* 12.4*  HGB 10.4* 10.2* 10.2*  PLT 254 275 323  CREATININE 1.10 0.95 0.91   Estimated Creatinine Clearance: 45.1 ml/min (by C-G formula based on Cr of 0.91). No results found for this basename: VANCOTROUGH, Corlis Leak, VANCORANDOM, GENTTROUGH, GENTPEAK, GENTRANDOM, TOBRATROUGH, TOBRAPEAK, TOBRARND, AMIKACINPEAK, AMIKACINTROU, AMIKACIN,  in the last 72 hours   Microbiology: Recent Results (from the past 720 hour(s))  URINE CULTURE     Status: None   Collection Time    05/27/14  1:28 PM      Result Value Ref Range Status   Specimen Description URINE, CLEAN CATCH   Final   Special Requests NONE   Final   Culture  Setup Time     Final   Value: 05/27/2014 18:31     Performed at [facility]     Final   Value: 75,000 COLONIES/ML     Performed at [facility]   Culture     Final   Value: Multiple bacterial morphotypes present, none predominant. Suggest appropriate recollection if clinically indicated.     Performed at [facility]   Report Status 05/28/2014 FINAL   Final  CULTURE, BLOOD (ROUTINE X 2)     Status: None   Collection Time    05/27/14  1:35 PM      Result Value Ref Range Status   Specimen Description BLOOD LEFT ANTECUBITAL    Final   Special Requests BOTTLES DRAWN AEROBIC AND ANAEROBIC 10CC   Final   Culture  Setup Time     Final   Value: 05/27/2014 17:40     Performed at [facility]   Culture     Final   Value:        BLOOD CULTURE RECEIVED NO GROWTH TO DATE CULTURE WILL BE HELD FOR 5 DAYS BEFORE ISSUING A FINAL NEGATIVE REPORT     Performed at [facility]   Report Status PENDING   Incomplete  CULTURE, BLOOD (ROUTINE X 2)     Status: None   Collection Time    05/27/14  1:40 PM      Result Value Ref Range Status   Specimen Description BLOOD RIGHT ANTECUBITAL   Final   Special Requests BOTTLES DRAWN AEROBIC AND ANAEROBIC 10CC   Final   Culture  Setup Time     Final   Value: 05/27/2014 17:40     Performed at [facility]   Culture     Final   Value:        BLOOD CULTURE RECEIVED NO GROWTH TO DATE CULTURE WILL BE HELD FOR 5 DAYS BEFORE ISSUING A FINAL NEGATIVE REPORT     Performed at [facility]   Report Status  PENDING   Incomplete  CULTURE, BLOOD (ROUTINE X 2)     Status: None   Collection Time    05/29/14  4:16 PM      Result Value Ref Range Status   Specimen Description BLOOD LEFT ARM   Final   Special Requests BOTTLES DRAWN AEROBIC AND ANAEROBIC 6CC   Final   Culture  Setup Time     Final   Value: 05/29/2014 19:57     Performed at [facility]   Culture     Final   Value:        BLOOD CULTURE RECEIVED NO GROWTH TO DATE CULTURE WILL BE HELD FOR 5 DAYS BEFORE ISSUING A FINAL NEGATIVE REPORT     Performed at [facility]   Report Status PENDING   Incomplete  CULTURE, BLOOD (ROUTINE X 2)     Status: None   Collection Time    05/29/14  4:30 PM      Result Value Ref Range Status   Specimen Description BLOOD RIGHT ARM   Final   Special Requests BOTTLES DRAWN AEROBIC AND ANAEROBIC  10 CC   Final   Culture  Setup Time     Final   Value: 05/29/2014 19:57     Performed at [facility]   Culture     Final   Value:        BLOOD CULTURE RECEIVED NO  GROWTH TO DATE CULTURE WILL BE HELD FOR 5 DAYS BEFORE ISSUING A FINAL NEGATIVE REPORT     Performed at [facility]   Report Status PENDING   Incomplete    Anti-infectives   Start     Dose/Rate Route Frequency Ordered Stop   06/01/14 1700  vancomycin (VANCOCIN) IVPB 1000 mg/200 mL premix     1,000 mg 200 mL/hr over 60 Minutes Intravenous Every 24 hours 06/01/14 1248     06/01/14 1400  amoxicillin-clavulanate (AUGMENTIN) 500-125 MG per tablet 500 mg     1 tablet Oral 3 times per day 06/01/14 1240     05/31/14 2200  ceFAZolin (ANCEF) IVPB 1 g/50 mL premix     1 g 100 mL/hr over 30 Minutes Intravenous 3 times per day 05/31/14 1501 05/31/14 2149   05/31/14 2200  doxycycline (VIBRA-TABS) tablet 100 mg  Status:  Discontinued     100 mg Oral Every 12 hours 05/31/14 1501 06/01/14 1240   05/30/14 1700  vancomycin (VANCOCIN) IVPB 1000 mg/200 mL premix  Status:  Discontinued     1,000 mg 200 mL/hr over 60 Minutes Intravenous Every 24 hours 05/30/14 0754 05/31/14 1501   05/30/14 1400  ceFAZolin (ANCEF) IVPB 1 g/50 mL premix  Status:  Discontinued     1 g 100 mL/hr over 30 Minutes Intravenous 3 times per day 05/30/14 1127 05/31/14 1501   05/29/14 1700  vancomycin (VANCOCIN) 1,250 mg in sodium chloride 0.9 % 250 mL IVPB  Status:  Discontinued     1,250 mg 166.7 mL/hr over 90 Minutes Intravenous Every 24 hours 05/29/14 1612 05/30/14 0754      Assessment: 42 YOF admitted with worsening cellulitis of LLE. WBC have been trending down, afebrile. Per MD, cellulitis looking better. SCr on admit 1.3, now trended down to 0.91, est CrCL ~28mL/min.  Note patient's vancomycin was stopped yesterday, so patient did not receive dose.  Blood culture with NGTD.  Goal of Therapy:  Vancomycin trough level 10-15 mcg/ml  Plan:  1. Vancomycin 1000mg  IV q24h 2. Follow  up c/s, LOT, clinical progression, renal function, trough PRN  Divine Imber D. L[place], PharmD, BCPS Clinical Pharmacist Pager:  [phone] 06/01/2014 2:27 PM

## 2014-06-01 NOTE — Progress Notes (Signed)
Attempted to see pt this morning.  Pt with new DVT noted in leg and has not been on anticoagulation for 24 hours.  Blood pressure also is 188/80.  Will hold seeing for now per nursing and check back tomorrow.  Jinger Neighbors, [place] [phone]

## 2014-06-01 NOTE — Progress Notes (Signed)
Pt found with right arm resting in seat of bedside chair and legs stretched out. Pt stated " I didn't want to call anyone. I started peeing and couldn't stop. I sat in the floor to clean it up and couldn't get up." . Pt denies any pain. Skin intact. Pt assisted off of floor with 2 assist. Urine found under pt. Pt cleaned up and assisted to bed. Bed alarm on. Will continue to monitor.
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
## 2014-06-02 LAB — CBC WITH DIFFERENTIAL/PLATELET
Basophils Absolute: 0 10*3/uL (ref 0.0–0.1)
Basophils Relative: 0 % (ref 0–1)
EOS PCT: 10 % — AB (ref 0–5)
Eosinophils Absolute: 1.2 10*3/uL — ABNORMAL HIGH (ref 0.0–0.7)
HEMATOCRIT: 30.7 % — AB (ref 36.0–46.0)
Hemoglobin: 10 g/dL — ABNORMAL LOW (ref 12.0–15.0)
LYMPHS PCT: 22 % (ref 12–46)
Lymphs Abs: 2.6 10*3/uL (ref 0.7–4.0)
MCH: 27.7 pg (ref 26.0–34.0)
MCHC: 32.6 g/dL (ref 30.0–36.0)
MCV: 85 fL (ref 78.0–100.0)
MONO ABS: 1.2 10*3/uL — AB (ref 0.1–1.0)
MONOS PCT: 10 % (ref 3–12)
Neutro Abs: 6.8 10*3/uL (ref 1.7–7.7)
Neutrophils Relative %: 58 % (ref 43–77)
Platelets: 309 10*3/uL (ref 150–400)
RBC: 3.61 MIL/uL — ABNORMAL LOW (ref 3.87–5.11)
RDW: 13.9 % (ref 11.5–15.5)
WBC: 11.8 10*3/uL — AB (ref 4.0–10.5)

## 2014-06-02 LAB — BASIC METABOLIC PANEL
ANION GAP: 11 (ref 5–15)
BUN: 7 mg/dL (ref 6–23)
CHLORIDE: 107 meq/L (ref 96–112)
CO2: 28 meq/L (ref 19–32)
CREATININE: 0.85 mg/dL (ref 0.50–1.10)
Calcium: 8.6 mg/dL (ref 8.4–10.5)
GFR calc Af Amer: 74 mL/min — ABNORMAL LOW (ref >90)
GFR calc non Af Amer: 63 mL/min — ABNORMAL LOW (ref >90)
GLUCOSE: 113 mg/dL — AB (ref 70–99)
Potassium: 4.2 mEq/L (ref 3.7–5.3)
Sodium: 146 mEq/L (ref 137–147)

## 2014-06-02 LAB — CULTURE, BLOOD (ROUTINE X 2)
CULTURE: NO GROWTH
Culture: NO GROWTH

## 2014-06-02 MED ORDER — POLYVINYL ALCOHOL 1.4 % OP SOLN
1.0000 [drp] | OPHTHALMIC | Status: DC | PRN
  Filled 2014-06-02: qty 15
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
## 2014-06-02 NOTE — Discharge Instructions (Addendum)
Follow with Primary MD [name], MD in 4 days   Get CBC, CMP  checked  by Primary MD next visit.    Activity: As tolerated with Full fall precautions use walker/cane & assistance as needed   Disposition Home     Diet: Heart Healthy    For Heart failure patients - Check your Weight same time everyday, if you gain over 2 pounds, or you develop in leg swelling, experience more shortness of breath or chest pain, call your Primary MD immediately. Follow Cardiac Low Salt Diet and 1.8 lit/day fluid restriction.   On your next visit with her primary care physician please Get Medicines reviewed and adjusted.  Please request your Prim.MD to go over all Hospital Tests and Procedure/Radiological results at the follow up, please get all Hospital records sent to your Prim MD by signing hospital release before you go home.   If you experience worsening of your admission symptoms, develop shortness of breath, life threatening emergency, suicidal or homicidal thoughts you must seek medical attention immediately by calling 911 or calling your MD immediately  if symptoms less severe.  You Must read complete instructions/literature along with all the possible adverse reactions/side effects for all the Medicines you take and that have been prescribed to you. Take any new Medicines after you have completely understood and accpet all the possible adverse reactions/side effects.   Do not drive, operating heavy machinery, perform activities at heights, swimming or participation in water activities or provide baby sitting services if your were admitted for syncope or siezures until you have seen by Primary MD or a Neurologist and advised to do so again.  Do not drive when taking Pain medications.    Do not take more than prescribed Pain, Sleep and Anxiety Medications  Special Instructions: If you have smoked or chewed Tobacco  in the last 2 yrs please stop smoking, stop any regular Alcohol  and or  any Recreational drug use.  Wear Seat belts while driving.   Please note  You were cared for by a hospitalist during your hospital stay. If you have any questions about your discharge medications or the care you received while you were in the hospital after you are discharged, you can call the unit and asked to speak with the hospitalist on call if the hospitalist that took care of you is not available. Once you are discharged, your primary care physician will handle any further medical issues. Please note that NO REFILLS for any discharge medications will be authorized once you are discharged, as it is imperative that you return to your primary care physician (or establish a relationship with a primary care physician if you do not have one) for your aftercare needs so that they can reassess your need for medications and monitor your lab values.    Information on my medicine - XARELTO (rivaroxaban)  This medication education was reviewed with me or my healthcare representative as part of my discharge preparation.  The pharmacist that spoke with me during my hospital stay was:  Bajbus, Lauren, RPH  WHY WAS XARELTO PRESCRIBED FOR YOU? Xarelto was prescribed to treat blood clots that may have been found in the veins of your legs (deep vein thrombosis) or in your lungs (pulmonary embolism) and to reduce the risk of them occurring again.  What do you need to know about Xarelto? The starting dose is one 15 mg tablet taken TWICE daily with food for the FIRST 21 DAYS then on  06/22/2014  the dose is changed to one 20 mg tablet taken ONCE A DAY with your evening meal.  DO NOT stop taking Xarelto without talking to the health care provider who prescribed the medication.  Refill your prescription for 20 mg tablets before you run out.  After discharge, you should have regular check-up appointments with your healthcare provider that is prescribing your Xarelto.  In the future your dose may need to be  changed if your kidney function changes by a significant amount.  What do you do if you miss a dose? If you are taking Xarelto TWICE DAILY and you miss a dose, take it as soon as you remember. You may take two 15 mg tablets (total 30 mg) at the same time then resume your regularly scheduled 15 mg twice daily the next day.  If you are taking Xarelto ONCE DAILY and you miss a dose, take it as soon as you remember on the same day then continue your regularly scheduled once daily regimen the next day. Do not take two doses of Xarelto at the same time.   Important Safety Information Xarelto is a blood thinner medicine that can cause bleeding. You should call your healthcare provider right away if you experience any of the following:   Bleeding from an injury or your nose that does not stop.   Unusual colored urine (red or dark brown) or unusual colored stools (red or black).   Unusual bruising for unknown reasons.   A serious fall or if you hit your head (even if there is no bleeding).  Some medicines may interact with Xarelto and might increase your risk of bleeding while on Xarelto. To help avoid this, consult your healthcare provider or pharmacist prior to using any new prescription or non-prescription medications, including herbals, vitamins, non-steroidal anti-inflammatory drugs (NSAIDs) and supplements.  This website has more information on Xarelto: [email or web address]

## 2014-06-02 NOTE — Progress Notes (Signed)
Occupational Therapy Treatment Patient Details Name: Cassandra Walker MRN: [id number] DOB: Nov 14, 1931 Today's Date: 06/02/2014    History of present illness Patient is a 80 yo female admitted 05/29/14 with cellulitis LLE.  PMH:  HTN, LLE DVT, chronic LLE wound, anemia, AKI, DM, peripheral neuropathy, cervical fx's s/p C 4-7 fusion - residual weakness esp hands..   OT comments  Pt instructed in walker safety for home.  She requires min guard assist for BADLs today.  Recommend HHOT  Follow Up Recommendations  Home health OT;Supervision/Assistance - 24 hour    Equipment Recommendations  None recommended by OT    Recommendations for Other Services      Precautions / Restrictions Precautions Precautions: Fall       Mobility Bed Mobility                  Transfers Overall transfer level: Needs assistance Equipment used: Rolling walker (2 wheeled) Transfers: Sit to/from [facility] Sit to Stand: Min guard Stand pivot transfers: Min guard       General transfer comment: Pt mildly unsteady moving sit to stand from recliner    Balance                                   ADL       Grooming: Brushing hair;Wash/dry face;Wash/dry hands;Min guard;Standing               Lower Body Dressing: Minimal assistance;Sit to/from stand   Toilet Transfer: Min guard;Ambulation;Regular Toilet;Grab bars           Functional mobility during ADLs: Min guard;Rolling walker General ADL Comments: Pt with significant scar tissue Rt hand (reports surgery ? tendon release in April 2015).  Myofascial release and soft tissue mobilization performed followed by passive stretch MCPs.  Pt able to retrieve styrofoam cup with Rt. UE.  Instructed pt on safe negotiating of RW through narrow spaces - needs verbal cues to carry info over.  Discussed safety issues with pt.  She insists she will have adequate assist at discharge.       Vision                      Perception     Praxis      Cognition   Behavior During Therapy: WFL for tasks assessed/performed Overall Cognitive Status: Within Functional Limits for tasks assessed                       Extremity/Trunk Assessment               Exercises Other Exercises Other Exercises: soft tissue mobs and passive stretch to MCPs Rt. hand    Shoulder Instructions       General Comments      Pertinent Vitals/ Pain       Denies pain   Home Living                                          Prior Functioning/Environment              Frequency Min 2X/week     Progress Toward Goals  OT Goals(current goals can now be found in the care plan section)     ADL Goals Pt Will Perform Lower  Body Dressing: with set-up;with adaptive equipment;sit to/from stand Pt Will Transfer to Toilet: with modified independence;ambulating;grab bars;regular height toilet Pt Will Perform Toileting - Clothing Manipulation and hygiene: with modified independence;sit to/from stand Additional ADL Goal #1: Pt will be independent with HEP for bilateral hands to increase strength.  Plan Discharge plan needs to be updated    Co-evaluation                 End of Session Equipment Utilized During Treatment: Rolling walker   Activity Tolerance Patient tolerated treatment well   Patient Left in chair;with call bell/phone within reach;with chair alarm set   Nurse Communication Mobility status        Time: 5732-2567 OT Time Calculation (min): 26 min  Charges: OT General Charges $OT Visit: 1 Procedure OT Treatments $Self Care/Home Management : 8-22 mins $Therapeutic Activity: 8-22 mins  Gonzalo Waymire M 06/02/2014, 4:39 PM

## 2014-06-02 NOTE — Progress Notes (Signed)
Physical Therapy Treatment Patient Details Name: Cassandra Walker MRN: [id number] DOB: September 15, 1932 Today's Date: 06/02/2014    History of Present Illness Patient is a 80 yo female admitted 05/29/14 with cellulitis LLE.  PMH:  HTN, LLE DVT, chronic LLE wound, anemia, AKI, DM, peripheral neuropathy, cervical fx's s/p C 4-7 fusion - residual weakness esp hands..    PT Comments    Pt with fall yesterday attempting to clean up urine from floor.  Discussed safety with pt.  Amb 130' with RW and S.  Follow Up Recommendations  Outpatient PT;Supervision - Intermittent     Equipment Recommendations  None recommended by PT    Recommendations for Other Services       Precautions / Restrictions Precautions Precautions: Fall Restrictions Weight Bearing Restrictions: No    Mobility  Bed Mobility Overal bed mobility: Modified Independent                Transfers   Equipment used: Rolling walker (2 wheeled) Transfers: Sit to/from Stand Sit to Stand: Supervision         General transfer comment: Demonstrating good use of hands.  Ambulation/Gait Ambulation/Gait assistance: Supervision Ambulation Distance (Feet): 130 Feet Assistive device: Rolling walker (2 wheeled) Gait Pattern/deviations: Decreased stance time - left;Decreased step length - right;Trunk flexed Gait velocity: Decreased   General Gait Details: cues to look up as able with hx of cervical issues   Stairs            Wheelchair Mobility    Modified Rankin (Stroke Patients Only)       Balance                                    Cognition Arousal/Alertness: Awake/alert Behavior During Therapy: WFL for tasks assessed/performed Overall Cognitive Status: Within Functional Limits for tasks assessed                      Exercises General Exercises - Lower Extremity Ankle Circles/Pumps: AROM;Both;10 reps;Seated Long Arc Quad: AROM;Both;10 reps;Seated Hip ABduction/ADduction:  AROM;Both;10 reps;Seated Hip Flexion/Marching: AROM;Both;10 reps;Seated    General Comments        Pertinent Vitals/Pain No c/o pain    Home Living                      Prior Function            PT Goals (current goals can now be found in the care plan section) Acute Rehab PT Goals Patient Stated Goal: go home. Time For Goal Achievement: 06/06/14 Potential to Achieve Goals: Good Progress towards PT goals: Progressing toward goals    Frequency  Min 3X/week    PT Plan Current plan remains appropriate    Co-evaluation             End of Session Equipment Utilized During Treatment: Gait belt Activity Tolerance: Patient tolerated treatment well Patient left: in chair;with call bell/phone within reach;with chair alarm set     Time: 7124-5809 PT Time Calculation (min): 25 min  Charges:  $Gait Training: 8-22 mins $Therapeutic Exercise: 8-22 mins                    G Codes:      Akshat Minehart LUBECK 06/02/2014, 9:45 AM

## 2014-06-02 NOTE — Progress Notes (Addendum)
[place] for xarelto  Indication: DVT  Allergies  Allergen Reactions  . Codeine Other (See Comments)    Jittery, nervous & hallucinations  . Oxybutynin     Tired   Patient Measurements: Height: 5\' 5"  (165.1 cm) Weight: 133 lb 2.5 oz (60.4 kg) IBW/kg (Calculated) : 57  Vital Signs: Temp: 98 F (36.7 C) (07/29 0503) Temp src: Oral (07/29 0503) BP: 150/90 mmHg (07/29 1014) Pulse Rate: 80 (07/29 1014)  Labs:  Recent Labs  05/31/14 0510 06/01/14 0500 06/02/14 0348  HGB 10.2* 10.2* 10.0*  HCT 31.1* 31.6* 30.7*  PLT 275 323 309  CREATININE 0.95 0.91 0.85    Estimated Creatinine Clearance: 48.3 ml/min (by C-G formula based on Cr of 0.85).  Assessment: 80 yo being treated for cellulitis. A doppler was done and showed a left DVT in the femoral vein. Xarelto was started on 7/27. Hgb low but stable, platelets are WNL. No bleeding noted. SCr has improved from admit- now 0.85 with est CrCl ~45-25mL/min- stable.  Goal of Therapy:  Monitor platelets by anticoagulation protocol: Yes   Plan:  1. Xarelto 15mg  PO BIDWC x 21 days, then 20mg  PO qsupper starting 8/18 2. Recommend a CBC at least q72h while in the hospital 3. Will provide discharge counseling 4. As patient has been stable for a couple of days, pharmacy will sign off of Xarelto consult. Please re-consult if the need arises.  Constantinos Krempasky D. Manya Balash, PharmD, BCPS Clinical Pharmacist Pager: [phone] 06/02/2014 1:33 PM

## 2014-06-03 ENCOUNTER — Encounter: Payer: Self-pay | Admitting: Occupational Therapy

## 2014-06-03 ENCOUNTER — Ambulatory Visit: Payer: Self-pay | Admitting: Physical Therapy

## 2014-06-03 MED ORDER — IBUPROFEN 400 MG PO TABS
400.0000 mg | ORAL_TABLET | Freq: Four times a day (QID) | ORAL | Status: DC | PRN
  Administered 2014-06-03: 400 mg via ORAL
  Filled 2014-06-03: qty 1

## 2014-06-03 MED ORDER — SACCHAROMYCES BOULARDII 250 MG PO CAPS: 250.0000 mg | ORAL_CAPSULE | Freq: Two times a day (BID) | ORAL | Status: DC

## 2014-06-03 MED ORDER — RIVAROXABAN 15 MG PO TABS: 15.0000 mg | ORAL_TABLET | Freq: Two times a day (BID) | ORAL | Status: DC

## 2014-06-03 MED ORDER — DOXYCYCLINE HYCLATE 100 MG PO CAPS
100.0000 mg | ORAL_CAPSULE | Freq: Two times a day (BID) | ORAL | Status: DC
Start: 2014-06-03 — End: 2014-07-21

## 2014-06-03 NOTE — Progress Notes (Signed)
Went over discharge instructions with patient, daughter at bedside. Updated on follow up appointments, new medications, given paper prescriptions. IV discontinued, patient taken off cardiac monitor. Discharged home with daughter. Elmarie Shiley R
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
## 2014-06-04 DIAGNOSIS — L8995 Pressure ulcer of unspecified site, unstageable: Secondary | ICD-10-CM

## 2014-06-04 DIAGNOSIS — L02619 Cutaneous abscess of unspecified foot: Secondary | ICD-10-CM

## 2014-06-04 DIAGNOSIS — L89899 Pressure ulcer of other site, unspecified stage: Secondary | ICD-10-CM

## 2014-06-04 DIAGNOSIS — L03119 Cellulitis of unspecified part of limb: Secondary | ICD-10-CM

## 2014-06-04 DIAGNOSIS — I82409 Acute embolism and thrombosis of unspecified deep veins of unspecified lower extremity: Secondary | ICD-10-CM

## 2014-06-04 LAB — CULTURE, BLOOD (ROUTINE X 2)
CULTURE: NO GROWTH
Culture: NO GROWTH

## 2014-06-07 ENCOUNTER — Encounter: Payer: Self-pay | Admitting: Occupational Therapy

## 2014-06-07 ENCOUNTER — Ambulatory Visit (INDEPENDENT_AMBULATORY_CARE_PROVIDER_SITE_OTHER): Payer: Medicare FFS | Admitting: Internal Medicine

## 2014-06-07 ENCOUNTER — Encounter: Payer: Self-pay | Admitting: Internal Medicine

## 2014-06-07 ENCOUNTER — Ambulatory Visit (INDEPENDENT_AMBULATORY_CARE_PROVIDER_SITE_OTHER): Payer: Medicare HMO | Admitting: Internal Medicine

## 2014-06-07 ENCOUNTER — Ambulatory Visit: Payer: Medicare FFS | Admitting: Physical Therapy

## 2014-06-07 VITALS — BP 132/76 | HR 84 | Temp 98.9°F | Resp 16 | Ht 65.25 in | Wt 140.6 lb

## 2014-06-07 VITALS — BP 136/70 | HR 73 | Temp 98.8°F | Ht 65.5 in | Wt 137.0 lb

## 2014-06-07 DIAGNOSIS — I82402 Acute embolism and thrombosis of unspecified deep veins of left lower extremity: Secondary | ICD-10-CM

## 2014-06-07 DIAGNOSIS — L02419 Cutaneous abscess of limb, unspecified: Secondary | ICD-10-CM

## 2014-06-07 DIAGNOSIS — L03119 Cellulitis of unspecified part of limb: Secondary | ICD-10-CM

## 2014-06-07 DIAGNOSIS — L03116 Cellulitis of left lower limb: Secondary | ICD-10-CM

## 2014-06-07 DIAGNOSIS — I82409 Acute embolism and thrombosis of unspecified deep veins of unspecified lower extremity: Secondary | ICD-10-CM

## 2014-06-07 DIAGNOSIS — R911 Solitary pulmonary nodule: Secondary | ICD-10-CM

## 2014-06-07 MED ORDER — OXYBUTYNIN CHLORIDE 5 MG PO TABS: 5.0000 mg | ORAL_TABLET | Freq: Three times a day (TID) | ORAL | Status: DC

## 2014-06-07 MED ORDER — CEPHALEXIN 500 MG PO CAPS: 500.0000 mg | ORAL_CAPSULE | Freq: Four times a day (QID) | ORAL | Status: AC

## 2014-06-07 NOTE — Progress Notes (Signed)
Subjective:    Patient ID: Cassandra Walker, female    DOB: 02-Feb-1932, 80 y.o.   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 Other problems include an enlarging lung nodule being f/u by Dr Melvyn Novas. [facility] Nurses are doing dressing changes on the left foot.    Medication List   atenolol 25 MG tablet  Commonly known as:  TENORMIN  Take 25 mg by mouth 2 (two) times daily.     atorvastatin 10 MG tablet  Commonly known as:  LIPITOR  Take 5 mg by mouth at bedtime.     cyclobenzaprine 10 MG tablet  Commonly known as:  FLEXERIL  Take 5 mg by mouth 3 (three) times daily as needed for muscle spasms.     doxycycline 100 MG capsule  Commonly known as:  VIBRAMYCIN  Take 1 capsule (100 mg total) by mouth 2 (two) times daily.     fish oil-omega-3 fatty acids 1000 MG capsule  Take 1 g by mouth daily.     levETIRAcetam 500 MG tablet  Commonly known as:  KEPPRA  Take 500 mg by mouth daily.     levothyroxine 75 MCG tablet  Commonly known as:  SYNTHROID, LEVOTHROID  Take 75 mcg by mouth daily.     lisinopril 20 MG tablet  Commonly known as:  PRINIVIL,ZESTRIL  Take 1 tablet (20 mg total) by mouth daily.     loratadine 10 MG tablet  Commonly known as:  CLARITIN  Take 10 mg by mouth daily as needed for allergies.     Magnesium 250 MG Tabs  Take 250 mg by mouth 2 (two) times daily.     meclizine 25 MG tablet  Commonly known as:  ANTIVERT  Take 25 mg by mouth 2 (two) times daily as needed for dizziness.     meloxicam 15 MG tablet  Commonly known as:  MOBIC  Take 1 tablet (15 mg total) by mouth daily. For Pain and inflammation     MYRBETRIQ 25 MG Tb24 tablet  Generic drug:  mirabegron ER  Take 25 mg by mouth daily.     naproxen sodium 220 MG tablet  Commonly known as:  ANAPROX  Take 440  mg by mouth daily as needed (pain).     ondansetron 4 MG disintegrating tablet  Commonly known as:  ZOFRAN ODT  Take 1 tablet (4 mg total) by mouth every 8 (eight) hours as needed for nausea.     Rivaroxaban 15 MG Tabs tablet  Commonly known as:  XARELTO  Take 1 tablet (15 mg total) by mouth 2 (two) times daily. To be switched to 20 mg daily on 06/22/2014 by PCP     saccharomyces boulardii 250 MG capsule  Commonly known as:  FLORASTOR  Take 1 capsule (250 mg total) by mouth 2 (two) times daily.     traMADol 50 MG tablet  Commonly known as:  ULTRAM  Take 50 mg by mouth 3 (three) times daily as needed for moderate pain.     VITAMIN D3 HIGH POTENCY PO  Take 2,000-5,000 Units by mouth 2 (two) times daily. Take 5000 units in the morning and take 2000 units in the evening.     Allergies  Allergen Reactions  . Codeine Other (See Comments)    Jittery, nervous & hallucinations  .  Oxybutynin     Tired   Past Medical History  Diagnosis Date  . Osteoarthritis   . Hyperlipidemia   . GERD (gastroesophageal reflux disease)   . Peripheral neuropathy   . Osteopenia   . DDD (degenerative disc disease)   . Shingles   . Colon cancer 1977  . Thyroid cancer 1966  . Peripheral vascular disease   . Deep vein blood clot of left lower extremity 05/06/13   Review of Systems In addition to the HPI above,  No Fever-chills,  No Headache, No changes with Vision or hearing,  No problems swallowing food or Liquids,  No Chest pain or productive Cough,  No Abdominal pain, No Nausea or Vomitting, Bowel movements are regular,  No Blood in stool or Urine,  No dysuria,  No new skin rashes or bruises,  No new joints pains-aches,  No new weakness, tingling, numbness in any extremity,  No recent weight loss,  No polyuria, polydypsia or polyphagia,  No significant Mental Stressors.  A full 10 point Review of Systems was done, except as stated above, all other Review of Systems were negative  Objective:    Physical Exam BP 132/76  Pulse 84  Temp(Src) 98.9 F (37.2 C)  Resp 16  Ht 5' 5.25" (1.657 m)  Wt 140 lb 9.6 oz (63.776 kg)  BMI 23.23 kg/m2  HEENT - Eac's patent. TM's Nl. EOM's full. PERRLA. NasoOroPharynx clear. Neck - supple. Nl Thyroid. Carotids 2+ & No bruits, nodes, JVD Chest - Clear equal BS w/o Rales, rhonchi, wheezes. Cor - Nl HS. RRR w/o sig MGR. PP 1(+). No edema. Abd - No palpable organomegaly, masses or tenderness. BS nl. MS- FROM w/o deformities. Muscle power, tone and bulk Nl. Gait Nl. Neuro - No obvious Cr N abnormalities. Sensory, motor and Cerebellar functions appear Nl w/o focal abnormalities. Psyche - Mental status normal & appropriate.  No delusions, ideations or obvious mood abnormalities. Skin - Left ankle/foot wrapped in gauze. Lt. Ankle and distal shin have edema 1-2 (+) with a reddish hue .   Assessment & Plan:   1. Cellulitis of left lower extremity - given Rx Keflex 500 mg tid #56 to follow the Doxycyline   2. DVT (deep venous thrombosis), left - continue Xarelto 15 mg bid to complete the 15 days prescribed and in addition given Sx's Xarelto 20 mg #30 to last til next app't  3. HTN

## 2014-06-07 NOTE — Patient Instructions (Signed)
Unable to do further work up on your lung nodule for now  Please schedule a follow up office visit in 6 weeks, call sooner if needed

## 2014-06-07 NOTE — Progress Notes (Addendum)
Subjective:    Patient ID: Cassandra Walker, female    DOB: July 23, 1932  MRN: [id number]  HPI  42 yowf quit smoking 1974 without resp problems evaluated by Dr Gwenette Greet 07/2011 for RLL nodule with non diagnostic findings and referred back to pulmonary p admit with incidental CT Chest findings by Dr Tanna Furry (ER doctor prior to Admit)  Discharge Date: 06/03/2014   Recommendations for primary care physician for things to follow:  Monitor CBC, BMP, left foot ulcer and cellulitis closely  Admission Diagnosis Peripheral vascular disease [443.9]  Cellulitis of left lower extremity [682.6]  Discharge Diagnosis Peripheral vascular disease [443.9]  Cellulitis of left lower extremity [682.6]  Active Problems:  Cellulitis  Acute lymphangitis of left lower extremity  ?Lower limb ischemia  Past Medical History   Diagnosis  Date   .  Osteoarthritis    .  Hyperlipidemia    .  GERD (gastroesophageal reflux disease)    .  Peripheral neuropathy    .  Osteopenia    .  DDD (degenerative disc disease)    .  Shingles    .  Colon cancer  1977   .  Thyroid cancer  1966   .  Peripheral vascular disease    .  Deep vein blood clot of left lower extremity  05/06/13    Past Surgical History   Procedure  Laterality  Date   .  Thyroidectomy     .  Total abdominal hysterectomy w/ bilateral salpingoophorectomy   1968   .  Colon resection   1977   .  Cervical laminectomy   1995   .  Cataract extraction       bilateral   .  Breast enhancement surgery       silicone   .  Breast surgery     .  Posterior cervical fusion/foraminotomy  N/A  03/07/2013     Procedure: Cervical four -seven POSTERIOR CERVICAL FUSION; Surgeon: Elaina Hoops, MD; Location: [facility]; Service: Neurosurgery; Laterality: N/A;        06/07/2014 1st [facility] office visit/ Cassandra Walker  Chief Complaint  Patient presents with  . Pulmonary Consult    Referred per Dr. Tanna Furry for eval of lung mass. Pt states went to ED on 05/29/14 with  fatigue- cxr and ct chest were done and pt states she was advised known lung mass has grown.      Very frail, very sedentary not limited by leg strength/  fatigue, not sob  No obvious day to day or daytime variabilty or assoc chronic cough or cp or chest tightness, subjective wheeze overt sinus or hb symptoms. No unusual exp hx or h/o childhood pna/ asthma or knowledge of premature birth.  Sleeping ok without nocturnal  or early am exacerbation  of respiratory  c/o's or need for noct saba. Also denies any obvious fluctuation of symptoms with weather or environmental changes or other aggravating or alleviating factors except as outlined above   Current Medications, Allergies, Complete Past Medical History, Past Surgical History, Family History, and Social History were reviewed in [employer] record.     Review of Systems  Constitutional: Negative for fever, chills and unexpected weight change.  HENT: Negative for congestion, dental problem, ear pain, nosebleeds, postnasal drip, rhinorrhea, sinus pressure, sneezing, sore throat, trouble swallowing and voice change.   Eyes: Negative for visual disturbance.  Respiratory: Negative for cough, choking and shortness of breath.   Cardiovascular: Negative for  chest pain and leg swelling.  Gastrointestinal: Negative for vomiting, abdominal pain and diarrhea.  Genitourinary: Negative for difficulty urinating.  Musculoskeletal: Negative for arthralgias.  Skin: Positive for rash.  Neurological: Positive for headaches. Negative for tremors and syncope.  Hematological: Does not bruise/bleed easily.       Objective:   Physical Exam  Very frail elderly wf with rolling sitter/ walker  Wt Readings from Last 3 Encounters:  06/07/14 137 lb (62.143 kg)  06/03/14 137 lb 5.6 oz (62.3 kg)  05/27/14 133 lb (60.328 kg)      HEENT: nl dentition, turbinates, and orophanx. Nl external ear canals without cough reflex   NECK :  without  JVD/Nodes/TM/ nl carotid upstrokes bilaterally   LUNGS: no acc muscle use, clear to A and P bilaterally without cough on insp or exp maneuvers   CV:  RRR  no s3 or murmur or increase in P2, no edema   ABD:  soft and nontender with nl excursion in the supine position. No bruits or organomegaly, bowel sounds nl  MS:  warm without deformities, calf tenderness, cyanosis or clubbing  SKIN: warm and dry without lesions    NEURO:  alert, approp, no deficits    CT Chest  05/2314 Spiculated mass superior segment right lower lobe has progressed  since 2012 compatible with carcinoma. Several small lung nodules on  the right are suspicious for metastatic disease. Mediastinal  adenopathy suspicious for metastatic disease.       Assessment & Plan:

## 2014-06-07 NOTE — Patient Instructions (Signed)
  Take the Keflex (Cephalexin)  500 mg    3 x day after meals

## 2014-06-08 ENCOUNTER — Telehealth: Payer: Self-pay | Admitting: *Deleted

## 2014-06-08 NOTE — Telephone Encounter (Signed)
Clair Gulling, physical therapist, for [facility], called and requested order for PT and OT referral.  OK per Dr Melford Aase.

## 2014-06-09 ENCOUNTER — Encounter: Payer: Self-pay | Admitting: Internal Medicine

## 2014-06-09 ENCOUNTER — Encounter: Payer: Self-pay | Admitting: Occupational Therapy

## 2014-06-09 ENCOUNTER — Ambulatory Visit: Payer: Self-pay | Admitting: Physical Therapy

## 2014-06-09 ENCOUNTER — Telehealth: Payer: Self-pay | Admitting: *Deleted

## 2014-06-09 NOTE — Telephone Encounter (Signed)
Message copied by Rosana Berger on Wed Jun 09, 2014  4:41 PM ------      Message from: Christinia Gully B      Created: Wed Jun 09, 2014  6:52 AM       This pt should be set up for f/u with Clance in 6 weeks  ------

## 2014-06-09 NOTE — Telephone Encounter (Signed)
Per [place]- schedule as ov but allow 30 min slot  Spoke with the pt and scheduled appt with [facility] for 07/21/14

## 2014-06-09 NOTE — Assessment & Plan Note (Addendum)
First noted 07/2010, mildly enlarged on f/u 06/2011 - CT Chest 05/27/14 c/w met dz  She is just recovering from cellulitis/ sepsis so PET Scanning is next step but should be delayed by 6 weeks as if this is metastatic dz the delay will may no difference in rx or prognosis and if it's not this will reduce likelihood of false pos hot spots  Will refer back to Dr Gwenette Greet, her pulmonologist of record   Discussed in detail all the  indications, usual  risks and alternatives  relative to the benefits with patient who agrees to proceed with plan as outlined

## 2014-06-10 ENCOUNTER — Encounter: Payer: Self-pay | Admitting: Registered Nurse

## 2014-06-10 ENCOUNTER — Other Ambulatory Visit: Payer: Self-pay

## 2014-06-10 ENCOUNTER — Encounter: Payer: Medicare FFS | Attending: Physical Medicine & Rehabilitation | Admitting: Registered Nurse

## 2014-06-10 VITALS — BP 113/74 | HR 78 | Resp 14 | Ht 65.0 in | Wt 137.0 lb

## 2014-06-10 DIAGNOSIS — N319 Neuromuscular dysfunction of bladder, unspecified: Secondary | ICD-10-CM | POA: Insufficient documentation

## 2014-06-10 DIAGNOSIS — G8250 Quadriplegia, unspecified: Secondary | ICD-10-CM | POA: Diagnosis present

## 2014-06-10 DIAGNOSIS — X58XXXS Exposure to other specified factors, sequela: Secondary | ICD-10-CM | POA: Diagnosis not present

## 2014-06-10 NOTE — Progress Notes (Signed)
Subjective:    Patient ID: Cassandra Walker, female    DOB: Jun 04, 1932, 80 y.o.   MRN: [id number]  HPI: Ms. Cassandra Walker is a 80 year old female who returns for follow up appointment. She denies having any pain at this time. Her granddaughter in room, all questions answered. She was at [facility] on July 23rd for dehydration she recived IV fluids. She has a lung mass which is being followed by pulmonology. Also was placed on antibiotics. Her grandaughter states she had a DVT and has been placed on Xarelto. She is being seen at the [facility] for Left Great Toe infection. Pain Inventory Average Pain 4 Pain Right Now 0 My pain is intermittent and burning  In the last 24 hours, has pain interfered with the following? General activity 1 Relation with others 0 Enjoyment of life 1 What TIME of day is your pain at its worst? evening Sleep (in general) Good  Pain is worse with: sitting Pain improves with: medication Relief from Meds: na  Mobility walk with assistance use a walker ability to climb steps?  yes do you drive?  no Do you have any goals in this area?  yes  Function retired I need assistance with the following:  meal prep Do you have any goals in this area?  yes  Neuro/Psych bladder control problems tingling spasms  Prior Studies Any changes since last visit?  yes x-rays CT/MRI  Physicians involved in your care Any changes since last visit?  yes Dr. Melvyn Novas   Family History  Problem Relation Age of Onset  . Lung cancer Father    History   Social History  . Marital Status: Widowed    Spouse Name: N/A    Number of Children: 4  . Years of Education: N/A   Occupational History  . retired from [employer]  . Smoking status: Former Smoker -- 1.00 packs/day for 20 years    Quit date: 11/05/1972  . Smokeless tobacco: Never Used  . Alcohol Use: No  . Drug Use: None  . Sexual Activity: None   Other Topics Concern   . None   Social History Narrative  . None   Past Surgical History  Procedure Laterality Date  . Thyroidectomy    . Total abdominal hysterectomy w/ bilateral salpingoophorectomy  1968  . Colon resection  1977  . Cervical laminectomy  1995  . Cataract extraction      bilateral  . Breast enhancement surgery      silicone  . Breast surgery    . Posterior cervical fusion/foraminotomy N/A 03/07/2013    Procedure: Cervical four -seven POSTERIOR CERVICAL FUSION;  Surgeon: Elaina Hoops, MD;  Location: [place] [facility];  Service: Neurosurgery;  Laterality: N/A;   Past Medical History  Diagnosis Date  . Osteoarthritis   . Hyperlipidemia   . GERD (gastroesophageal reflux disease)   . Peripheral neuropathy   . Osteopenia   . DDD (degenerative disc disease)   . Shingles   . Colon cancer 1977  . Thyroid cancer 1966  . Peripheral vascular disease   . Deep vein blood clot of left lower extremity 05/06/13   BP 113/74  Pulse 78  Resp 14  Ht 5\' 5"  (1.651 m)  Wt 137 lb (62.143 kg)  BMI 22.80 kg/m2  SpO2 97%  Opioid Risk Score:   Fall Risk Score: High Fall Risk (>13 points) (pt educated brochure declined)  Review of Systems  Gastrointestinal: Positive for nausea.  Genitourinary:       Bladder control problems   Skin: Positive for rash.  Neurological:       Tingling, spasms  All other systems reviewed and are negative.      Objective:   Physical Exam  Nursing note and vitals reviewed. Constitutional: She is oriented to person, place, and time. She appears well-developed and well-nourished.  HENT:  Head: Normocephalic and atraumatic.  Neck: Normal range of motion. Neck supple.  Cardiovascular: Normal rate, regular rhythm and normal heart sounds.   Pulmonary/Chest: Effort normal and breath sounds normal.  Musculoskeletal:  Normal Muscle Bulk and Muscle Testing Reveals: Upper and Lower Extremities: Full ROM and Muscle Strength 5/5 Arises from chair with ease Narrow based gait   Neurological: She is alert and oriented to person, place, and time.  Skin: Skin is warm and dry.  Psychiatric: She has a normal mood and affect.          Assessment & Plan:  1. 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 Continue to Monitor 2. LLE DVT : Continue Xarelto   30 minutes of face to face patient care time was spent during this visit. All questions were encouraged and answered.   F/U in 3 months

## 2014-06-12 ENCOUNTER — Other Ambulatory Visit: Payer: Self-pay | Admitting: Physician Assistant

## 2014-06-14 ENCOUNTER — Ambulatory Visit: Payer: Self-pay | Admitting: Physical Therapy

## 2014-06-14 ENCOUNTER — Encounter: Payer: Self-pay | Admitting: Occupational Therapy

## 2014-06-17 ENCOUNTER — Ambulatory Visit: Payer: Self-pay | Admitting: Physical Therapy

## 2014-06-17 ENCOUNTER — Encounter: Payer: Self-pay | Admitting: Occupational Therapy

## 2014-06-21 ENCOUNTER — Encounter: Payer: Self-pay | Admitting: Occupational Therapy

## 2014-06-21 ENCOUNTER — Ambulatory Visit: Payer: Self-pay | Admitting: Physical Therapy

## 2014-06-24 ENCOUNTER — Encounter: Payer: Self-pay | Admitting: Occupational Therapy

## 2014-06-24 ENCOUNTER — Telehealth: Payer: Self-pay | Admitting: *Deleted

## 2014-06-24 ENCOUNTER — Ambulatory Visit: Payer: Self-pay | Admitting: Physical Therapy

## 2014-06-24 NOTE — Telephone Encounter (Signed)
OK to extend physical therapy 2 times a week for 2 more weeks per Dr Melford Aase.

## 2014-07-08 ENCOUNTER — Encounter (HOSPITAL_BASED_OUTPATIENT_CLINIC_OR_DEPARTMENT_OTHER): Payer: Medicare FFS | Attending: Internal Medicine

## 2014-07-08 DIAGNOSIS — L97509 Non-pressure chronic ulcer of other part of unspecified foot with unspecified severity: Secondary | ICD-10-CM | POA: Insufficient documentation

## 2014-07-08 DIAGNOSIS — L03119 Cellulitis of unspecified part of limb: Secondary | ICD-10-CM | POA: Diagnosis not present

## 2014-07-08 DIAGNOSIS — L02619 Cutaneous abscess of unspecified foot: Secondary | ICD-10-CM | POA: Insufficient documentation

## 2014-07-15 ENCOUNTER — Ambulatory Visit: Payer: Self-pay | Admitting: Internal Medicine

## 2014-07-15 DIAGNOSIS — L02619 Cutaneous abscess of unspecified foot: Secondary | ICD-10-CM | POA: Diagnosis not present

## 2014-07-15 DIAGNOSIS — L97509 Non-pressure chronic ulcer of other part of unspecified foot with unspecified severity: Secondary | ICD-10-CM | POA: Diagnosis not present

## 2014-07-16 ENCOUNTER — Ambulatory Visit: Payer: Self-pay | Admitting: Internal Medicine

## 2014-07-19 ENCOUNTER — Ambulatory Visit: Payer: Self-pay | Admitting: Internal Medicine

## 2014-07-21 ENCOUNTER — Ambulatory Visit (INDEPENDENT_AMBULATORY_CARE_PROVIDER_SITE_OTHER): Payer: Medicare FFS | Admitting: Pulmonary Disease

## 2014-07-21 ENCOUNTER — Encounter: Payer: Self-pay | Admitting: Pulmonary Disease

## 2014-07-21 VITALS — BP 112/64 | HR 66 | Temp 97.7°F | Ht 65.5 in | Wt 132.0 lb

## 2014-07-21 DIAGNOSIS — R222 Localized swelling, mass and lump, trunk: Secondary | ICD-10-CM

## 2014-07-21 DIAGNOSIS — R918 Other nonspecific abnormal finding of lung field: Secondary | ICD-10-CM

## 2014-07-21 NOTE — Progress Notes (Signed)
   Subjective:    Patient ID: Cassandra Walker, female    DOB: May 09, 1932, 80 y.o.   MRN: [id number]  HPI The patient comes in today for followup of an abnormal CT chest. The patient was evaluated in 2012 for a nodule/mass involving her right lower lobe. Surprisingly, the abnormal density had very little uptake on PET, and she did have bronchoscopy that was negative for malignancy. The plan was for a short followup with a CT chest, and this was scheduled. Unfortunately, the patient did not show for the CT chest, and never followed up with me. She comes in now wear a followup CT 3 years later has shown increased of the mass in question, and also the development of mediastinal adenopathy. There are also other nodular densities that may represent satellite metastases. The patient currently is not eating well, and has lost some weight according to the family member. She denies any chest pain, headaches, and has not had any hemoptysis. Her breathing has been completely stable.   Review of Systems  Constitutional: Negative for fever and unexpected weight change.  HENT: Negative for congestion, dental problem, ear pain, nosebleeds, postnasal drip, rhinorrhea, sinus pressure, sneezing, sore throat and trouble swallowing.   Eyes: Negative for redness and itching.  Respiratory: Negative for cough, chest tightness, shortness of breath and wheezing.   Cardiovascular: Negative for palpitations and leg swelling.  Gastrointestinal: Negative for nausea and vomiting.  Genitourinary: Negative for dysuria.  Musculoskeletal: Negative for joint swelling.  Skin: Negative for rash.  Neurological: Negative for headaches.  Hematological: Does not bruise/bleed easily.  Psychiatric/Behavioral: Negative for dysphoric mood. The patient is not nervous/anxious.        Objective:   Physical Exam Frail-appearing female in no acute distress Nose without purulence or discharge noted Neck without lymphadenopathy or  thyromegaly. There is no supraclavicular lymphadenopathy seen. Chest with mildly decreased breath sounds, no wheezing Cardiac exam with regular rate and rhythm Lower extremities with mild edema, support boot on left foot Alert and oriented, moves all 4 extremities.       Assessment & Plan:

## 2014-07-21 NOTE — Assessment & Plan Note (Signed)
The patient has an enlarging right lower lobe mass with new mediastinal lymphadenopathy that I suspect is secondary to bronchogenic cancer. 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 I have outlined various approaches with the patient, one of which is bronchoscopy with endobronchial ultrasound to make a tissue diagnosis and also his stage the abnormality. I also discussed with her the possibility of doing a PET scan in order to reassess everything, and see if she had any distant metastases that would be easier to biopsy given her frailty. The patient is very hesitant to do anything about her abnormality, and understands that it probably represents a lung cancer. She does want to do a PET scan, and then we can discuss diagnostic options further. She is leaning toward doing nothing at this point.

## 2014-07-21 NOTE — Patient Instructions (Signed)
Will schedule for PET scan, and will arrange followup thereafter to discuss further.

## 2014-07-22 ENCOUNTER — Ambulatory Visit (INDEPENDENT_AMBULATORY_CARE_PROVIDER_SITE_OTHER): Payer: Medicare HMO | Admitting: Internal Medicine

## 2014-07-22 ENCOUNTER — Encounter: Payer: Self-pay | Admitting: Internal Medicine

## 2014-07-22 VITALS — BP 148/82 | HR 72 | Temp 97.9°F | Resp 16 | Ht 65.25 in | Wt 133.4 lb

## 2014-07-22 DIAGNOSIS — E1129 Type 2 diabetes mellitus with other diabetic kidney complication: Secondary | ICD-10-CM

## 2014-07-22 DIAGNOSIS — L02619 Cutaneous abscess of unspecified foot: Secondary | ICD-10-CM | POA: Diagnosis not present

## 2014-07-22 DIAGNOSIS — E559 Vitamin D deficiency, unspecified: Secondary | ICD-10-CM

## 2014-07-22 DIAGNOSIS — I10 Essential (primary) hypertension: Secondary | ICD-10-CM

## 2014-07-22 DIAGNOSIS — Z79899 Other long term (current) drug therapy: Secondary | ICD-10-CM

## 2014-07-22 DIAGNOSIS — L03119 Cellulitis of unspecified part of limb: Secondary | ICD-10-CM | POA: Diagnosis not present

## 2014-07-22 DIAGNOSIS — L97509 Non-pressure chronic ulcer of other part of unspecified foot with unspecified severity: Secondary | ICD-10-CM | POA: Diagnosis not present

## 2014-07-22 DIAGNOSIS — E782 Mixed hyperlipidemia: Secondary | ICD-10-CM

## 2014-07-22 LAB — CBC WITH DIFFERENTIAL/PLATELET
BASOS ABS: 0.1 10*3/uL (ref 0.0–0.1)
BASOS PCT: 1 % (ref 0–1)
Eosinophils Absolute: 1.4 10*3/uL — ABNORMAL HIGH (ref 0.0–0.7)
Eosinophils Relative: 16 % — ABNORMAL HIGH (ref 0–5)
HCT: 33.7 % — ABNORMAL LOW (ref 36.0–46.0)
Hemoglobin: 11.1 g/dL — ABNORMAL LOW (ref 12.0–15.0)
Lymphocytes Relative: 22 % (ref 12–46)
Lymphs Abs: 1.9 10*3/uL (ref 0.7–4.0)
MCH: 27.6 pg (ref 26.0–34.0)
MCHC: 32.9 g/dL (ref 30.0–36.0)
MCV: 83.8 fL (ref 78.0–100.0)
MONO ABS: 0.6 10*3/uL (ref 0.1–1.0)
Monocytes Relative: 7 % (ref 3–12)
NEUTROS ABS: 4.6 10*3/uL (ref 1.7–7.7)
Neutrophils Relative %: 54 % (ref 43–77)
PLATELETS: 305 10*3/uL (ref 150–400)
RBC: 4.02 MIL/uL (ref 3.87–5.11)
RDW: 15.2 % (ref 11.5–15.5)
WBC: 8.6 10*3/uL (ref 4.0–10.5)

## 2014-07-22 NOTE — Patient Instructions (Signed)

## 2014-07-22 NOTE — Progress Notes (Signed)
Patient ID: Cassandra Walker, female   DOB: 08/08/1932, 80 y.o.   MRN: [id number]   This very nice 80 y.o.female presents for 3 month follow up with Hypertension, Hypothyroidism, Hyperlipidemia, Pre-Diabetes and Vitamin D Deficiency. Recently patient has been followed at the wound center for a wound of the left foot which recently cultured (+) and she was started on Cipro.    Patient is treated for HTN & BP has been controlled at home. Today's BP: 148/82 mmHg. Patient denies any cardiac type chest pain, palpitations, dyspnea/orthopnea/PND, dizziness, claudication, or dependent edema.   Hyperlipidemia is controlled with diet & meds. Patient denies myalgias or other med SE's. Last Lipids were - at goal Total Chol 151; HDL Chol 59; LDL 72; Trig 102 on 05/17/2014.    Also, the patient has history of PreDiabetes and patient denies any symptoms of reactive hypoglycemia, diabetic polys, paresthesias or visual blurring.  Last A1c was  6.3% on 05/29/2014.   Further, Patient has history of Vitamin D Deficiency and patient supplements vitamin D without any suspected side-effects. Last vitamin D was  102 on 05/17/2014 and patient was advised to decrease dose of Vit D.   Medication List   atenolol 25 MG tablet  Commonly known as:  TENORMIN  Take 25 mg by mouth 2 (two) times daily.     atorvastatin 10 MG tablet  Commonly known as:  LIPITOR  Take 5 mg by mouth at bedtime.     ciprofloxacin 500 MG tablet  Commonly known as:  CIPRO  1 tablet twice daily     cyclobenzaprine 10 MG tablet  Commonly known as:  FLEXERIL  Take 5 mg by mouth 3 (three) times daily as needed for muscle spasms.     fish oil-omega-3 fatty acids 1000 MG capsule  Take 1 g by mouth daily.     levothyroxine 75 MCG tablet  Commonly known as:  SYNTHROID, LEVOTHROID  Take 75 mcg by mouth daily.     lisinopril 20 MG tablet  Commonly known as:  PRINIVIL,ZESTRIL  Take 1 tablet (20 mg total) by mouth daily.     loratadine 10 MG tablet   Commonly known as:  CLARITIN  Take 10 mg by mouth daily as needed for allergies.     Magnesium 250 MG Tabs  Take 250 mg by mouth 2 (two) times daily.     meclizine 25 MG tablet  Commonly known as:  ANTIVERT  TAKE 1 TABLET BY MOUTH THREE TIMES DAILY AS NEEDED     meloxicam 15 MG tablet  Commonly known as:  MOBIC  Take 1 tablet (15 mg total) by mouth daily. For Pain and inflammation     naproxen sodium 220 MG tablet  Commonly known as:  ANAPROX  Take 440 mg by mouth daily as needed (pain).     ondansetron 4 MG disintegrating tablet  Commonly known as:  ZOFRAN ODT  Take 1 tablet (4 mg total) by mouth every 8 (eight) hours as needed for nausea.     Rivaroxaban 15 MG Tabs tablet  Commonly known as:  XARELTO  Take 1 tablet (15 mg total) by mouth 2 (two) times daily. To be switched to 20 mg daily on 06/22/2014 by PCP     traMADol 50 MG tablet  Commonly known as:  ULTRAM  Take 50 mg by mouth 3 (three) times daily as needed for moderate pain.     VITAMIN D3 HIGH POTENCY PO  Take 2,000-5,000 Units by mouth 2 (two)  times daily. Take 5000 units in the morning and take 2000 units in the evening.     Allergies  Allergen Reactions  . Codeine Other (See Comments)    Jittery, nervous & hallucinations  . Oxybutynin     Tired   PMHx:   Past Medical History  Diagnosis Date  . Osteoarthritis   . Hyperlipidemia   . GERD (gastroesophageal reflux disease)   . Peripheral neuropathy   . Osteopenia   . DDD (degenerative disc disease)   . Shingles   . Colon cancer 1977  . Thyroid cancer 1966  . Peripheral vascular disease   . Deep vein blood clot of left lower extremity 05/06/13   FHx:    Reviewed / unchanged SHx:    Reviewed / unchanged  Systems Review:  Constitutional: Denies fever, chills, wt changes, headaches, insomnia, fatigue, night sweats, change in appetite. Eyes: Denies redness, blurred vision, diplopia, discharge, itchy, watery eyes.  ENT: Denies discharge, congestion,  post nasal drip, epistaxis, sore throat, earache, hearing loss, dental pain, tinnitus, vertigo, sinus pain, snoring.  CV: Denies chest pain, palpitations, irregular heartbeat, syncope, dyspnea, diaphoresis, orthopnea, PND, claudication or edema. Respiratory: denies cough, dyspnea, DOE, pleurisy, hoarseness, laryngitis, wheezing.  Gastrointestinal: Denies dysphagia, odynophagia, heartburn, reflux, water brash, abdominal pain or cramps, nausea, vomiting, bloating, diarrhea, constipation, hematemesis, melena, hematochezia  or hemorrhoids. Genitourinary: Denies dysuria, frequency, urgency, nocturia, hesitancy, discharge, hematuria or flank pain. Musculoskeletal: Denies arthralgias, myalgias, stiffness, jt. swelling, pain, limping or strain/sprain.  Skin: Denies pruritus, rash, hives, warts, acne, eczema or change in skin lesion(s). Neuro: No weakness, tremor, incoordination, spasms, paresthesia or pain. Psychiatric: Denies confusion, memory loss or sensory loss. Endo: Denies change in weight, skin or hair change.  Heme/Lymph: No excessive bleeding, bruising or enlarged lymph nodes.  Exam:  BP 148/82  Pulse 72  Temp(Src) 97.9 F (36.6 C) (Temporal)  Resp 16  Ht 5' 5.25" (1.657 m)  Wt 133 lb 6.4 oz (60.51 kg)  BMI 22.04 kg/m2  Appears well nourished and in no distress. Eyes: PERRLA, EOMs, conjunctiva no swelling or erythema. Sinuses: No frontal/maxillary tenderness ENT/Mouth: EAC's clear, TM's nl w/o erythema, bulging. Nares clear w/o erythema, swelling, exudates. Oropharynx clear without erythema or exudates. Oral hygiene is good. Tongue normal, non obstructing. Hearing intact.  Neck: Supple. Thyroid nl. Car 2+/2+ without bruits, nodes or JVD. Chest: Respirations nl with BS clear & equal w/o rales, rhonchi, wheezing or stridor.  Cor: Heart sounds normal w/ regular rate and rhythm without sig. murmurs, gallops, clicks, or rubs. Peripheral pulses normal and equal  without edema.  Abdomen: Soft  & bowel sounds normal. Non-tender w/o guarding, rebound, hernias, masses, or organomegaly.  Lymphatics: Unremarkable.  Musculoskeletal: Full ROM all peripheral extremities, joint stability, 5/5 strength, and normal gait. Left foot wrapped & in wooden shoe.  Skin: Warm, dry without exposed rashes, lesions or ecchymosis apparent.  Neuro: Cranial nerves intact, reflexes equal bilaterally. Sensory-motor testing grossly intact. Tendon reflexes grossly intact.  Pysch: Alert & oriented x 3.  Insight and judgement nl & appropriate. No ideations.  Assessment and Plan:  1. Hypertension - Continue monitor blood pressure at home. Continue diet/meds same.  2. Hyperlipidemia - Continue diet/meds, exercise,& lifestyle modifications. 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 Monitor appropriate labs.  4. Vitamin D Deficiency - Continue supplementation.  Recommended regular exercise, BP monitoring, weight control, and discussed med and SE's. Recommended labs to assess and monitor clinical status. Further  disposition pending results of labs.

## 2014-07-23 LAB — LIPID PANEL
Cholesterol: 136 mg/dL (ref 0–200)
HDL: 54 mg/dL (ref >39)
LDL Cholesterol: 67 mg/dL (ref 0–99)
TRIGLYCERIDES: 75 mg/dL (ref <150)
Total CHOL/HDL Ratio: 2.5 Ratio
VLDL: 15 mg/dL (ref 0–40)

## 2014-07-23 LAB — HEMOGLOBIN A1C
Hgb A1c MFr Bld: 6 % — ABNORMAL HIGH (ref <5.7)
Mean Plasma Glucose: 126 mg/dL — ABNORMAL HIGH (ref <117)

## 2014-07-23 LAB — HEPATIC FUNCTION PANEL
ALBUMIN: 3.7 g/dL (ref 3.5–5.2)
ALK PHOS: 66 U/L (ref 39–117)
ALT: 13 U/L (ref 0–35)
AST: 18 U/L (ref 0–37)
BILIRUBIN TOTAL: 0.4 mg/dL (ref 0.2–1.2)
Bilirubin, Direct: 0.1 mg/dL (ref 0.0–0.3)
Indirect Bilirubin: 0.3 mg/dL (ref 0.2–1.2)
TOTAL PROTEIN: 6.6 g/dL (ref 6.0–8.3)

## 2014-07-23 LAB — BASIC METABOLIC PANEL WITH GFR
BUN: 33 mg/dL — AB (ref 6–23)
CHLORIDE: 102 meq/L (ref 96–112)
CO2: 30 mEq/L (ref 19–32)
Calcium: 9.2 mg/dL (ref 8.4–10.5)
Creat: 1.37 mg/dL — ABNORMAL HIGH (ref 0.50–1.10)
GFR, EST AFRICAN AMERICAN: 42 mL/min — AB
GFR, EST NON AFRICAN AMERICAN: 36 mL/min — AB
Glucose, Bld: 76 mg/dL (ref 70–99)
POTASSIUM: 4.7 meq/L (ref 3.5–5.3)
Sodium: 138 mEq/L (ref 135–145)

## 2014-07-23 LAB — VITAMIN D 25 HYDROXY (VIT D DEFICIENCY, FRACTURES): VIT D 25 HYDROXY: 103 ng/mL — AB (ref 30–89)

## 2014-07-23 LAB — TSH: TSH: 4.362 u[IU]/mL (ref 0.350–4.500)

## 2014-07-23 LAB — MAGNESIUM: Magnesium: 2 mg/dL (ref 1.5–2.5)

## 2014-07-23 LAB — INSULIN, FASTING: INSULIN FASTING, SERUM: 5 u[IU]/mL (ref 2.0–19.6)

## 2014-07-28 ENCOUNTER — Ambulatory Visit (HOSPITAL_COMMUNITY): Payer: Medicare FFS

## 2014-07-29 ENCOUNTER — Other Ambulatory Visit: Payer: Self-pay | Admitting: Internal Medicine

## 2014-07-29 DIAGNOSIS — L03119 Cellulitis of unspecified part of limb: Secondary | ICD-10-CM | POA: Diagnosis not present

## 2014-07-29 DIAGNOSIS — L97509 Non-pressure chronic ulcer of other part of unspecified foot with unspecified severity: Secondary | ICD-10-CM | POA: Diagnosis not present

## 2014-07-29 DIAGNOSIS — L02619 Cutaneous abscess of unspecified foot: Secondary | ICD-10-CM | POA: Diagnosis not present

## 2014-08-02 ENCOUNTER — Other Ambulatory Visit: Payer: Self-pay | Admitting: Internal Medicine

## 2014-08-05 ENCOUNTER — Encounter (HOSPITAL_BASED_OUTPATIENT_CLINIC_OR_DEPARTMENT_OTHER): Payer: Medicare FFS | Attending: Internal Medicine

## 2014-08-05 DIAGNOSIS — G629 Polyneuropathy, unspecified: Secondary | ICD-10-CM | POA: Diagnosis not present

## 2014-08-05 DIAGNOSIS — L97522 Non-pressure chronic ulcer of other part of left foot with fat layer exposed: Secondary | ICD-10-CM | POA: Insufficient documentation

## 2014-08-05 DIAGNOSIS — L03116 Cellulitis of left lower limb: Secondary | ICD-10-CM | POA: Insufficient documentation

## 2014-08-05 DIAGNOSIS — L97529 Non-pressure chronic ulcer of other part of left foot with unspecified severity: Secondary | ICD-10-CM | POA: Diagnosis present

## 2014-08-06 ENCOUNTER — Ambulatory Visit: Payer: Self-pay | Admitting: Internal Medicine

## 2014-08-12 ENCOUNTER — Other Ambulatory Visit: Payer: Self-pay | Admitting: Internal Medicine

## 2014-08-12 DIAGNOSIS — L03116 Cellulitis of left lower limb: Secondary | ICD-10-CM | POA: Diagnosis not present

## 2014-08-12 DIAGNOSIS — G629 Polyneuropathy, unspecified: Secondary | ICD-10-CM | POA: Diagnosis not present

## 2014-08-12 DIAGNOSIS — L97529 Non-pressure chronic ulcer of other part of left foot with unspecified severity: Secondary | ICD-10-CM | POA: Diagnosis not present

## 2014-08-12 DIAGNOSIS — L97522 Non-pressure chronic ulcer of other part of left foot with fat layer exposed: Secondary | ICD-10-CM | POA: Diagnosis not present

## 2014-08-16 ENCOUNTER — Other Ambulatory Visit: Payer: Self-pay | Admitting: Internal Medicine

## 2014-08-19 DIAGNOSIS — G629 Polyneuropathy, unspecified: Secondary | ICD-10-CM | POA: Diagnosis not present

## 2014-08-19 DIAGNOSIS — L97522 Non-pressure chronic ulcer of other part of left foot with fat layer exposed: Secondary | ICD-10-CM | POA: Diagnosis not present

## 2014-08-19 DIAGNOSIS — L03116 Cellulitis of left lower limb: Secondary | ICD-10-CM | POA: Diagnosis not present

## 2014-08-19 DIAGNOSIS — L97529 Non-pressure chronic ulcer of other part of left foot with unspecified severity: Secondary | ICD-10-CM | POA: Diagnosis not present

## 2014-08-25 ENCOUNTER — Encounter: Payer: Self-pay | Admitting: Emergency Medicine

## 2014-08-26 DIAGNOSIS — G629 Polyneuropathy, unspecified: Secondary | ICD-10-CM | POA: Diagnosis not present

## 2014-08-26 DIAGNOSIS — L97522 Non-pressure chronic ulcer of other part of left foot with fat layer exposed: Secondary | ICD-10-CM | POA: Diagnosis not present

## 2014-08-26 DIAGNOSIS — L03116 Cellulitis of left lower limb: Secondary | ICD-10-CM | POA: Diagnosis not present

## 2014-08-26 DIAGNOSIS — L97529 Non-pressure chronic ulcer of other part of left foot with unspecified severity: Secondary | ICD-10-CM | POA: Diagnosis not present

## 2014-09-02 DIAGNOSIS — L03116 Cellulitis of left lower limb: Secondary | ICD-10-CM | POA: Diagnosis not present

## 2014-09-02 DIAGNOSIS — L97522 Non-pressure chronic ulcer of other part of left foot with fat layer exposed: Secondary | ICD-10-CM | POA: Diagnosis not present

## 2014-09-02 DIAGNOSIS — L97529 Non-pressure chronic ulcer of other part of left foot with unspecified severity: Secondary | ICD-10-CM | POA: Diagnosis not present

## 2014-09-02 DIAGNOSIS — G629 Polyneuropathy, unspecified: Secondary | ICD-10-CM | POA: Diagnosis not present

## 2014-09-08 ENCOUNTER — Encounter: Payer: Medicare FFS | Attending: Physical Medicine & Rehabilitation | Admitting: Physical Medicine & Rehabilitation

## 2014-09-10 ENCOUNTER — Encounter (HOSPITAL_BASED_OUTPATIENT_CLINIC_OR_DEPARTMENT_OTHER): Payer: Medicare FFS | Attending: Internal Medicine

## 2014-09-10 DIAGNOSIS — L97521 Non-pressure chronic ulcer of other part of left foot limited to breakdown of skin: Secondary | ICD-10-CM | POA: Diagnosis present

## 2014-09-15 ENCOUNTER — Encounter: Payer: Self-pay | Admitting: Physician Assistant

## 2014-09-17 DIAGNOSIS — L97521 Non-pressure chronic ulcer of other part of left foot limited to breakdown of skin: Secondary | ICD-10-CM | POA: Diagnosis not present

## 2014-09-24 DIAGNOSIS — L97521 Non-pressure chronic ulcer of other part of left foot limited to breakdown of skin: Secondary | ICD-10-CM | POA: Diagnosis not present

## 2014-10-02 ENCOUNTER — Other Ambulatory Visit: Payer: Self-pay | Admitting: Physician Assistant

## 2014-10-08 ENCOUNTER — Other Ambulatory Visit: Payer: Self-pay | Admitting: Internal Medicine

## 2014-10-08 ENCOUNTER — Encounter (HOSPITAL_BASED_OUTPATIENT_CLINIC_OR_DEPARTMENT_OTHER): Payer: Medicare FFS | Attending: Internal Medicine

## 2014-10-08 DIAGNOSIS — S91102D Unspecified open wound of left great toe without damage to nail, subsequent encounter: Secondary | ICD-10-CM

## 2014-10-08 DIAGNOSIS — L03116 Cellulitis of left lower limb: Secondary | ICD-10-CM | POA: Insufficient documentation

## 2014-10-08 DIAGNOSIS — B9689 Other specified bacterial agents as the cause of diseases classified elsewhere: Secondary | ICD-10-CM | POA: Diagnosis not present

## 2014-10-08 DIAGNOSIS — L97521 Non-pressure chronic ulcer of other part of left foot limited to breakdown of skin: Secondary | ICD-10-CM | POA: Insufficient documentation

## 2014-10-08 DIAGNOSIS — S91302D Unspecified open wound, left foot, subsequent encounter: Secondary | ICD-10-CM

## 2014-10-11 ENCOUNTER — Other Ambulatory Visit: Payer: Self-pay | Admitting: *Deleted

## 2014-10-11 MED ORDER — LISINOPRIL 20 MG PO TABS: 20.0000 mg | ORAL_TABLET | Freq: Every day | ORAL | Status: DC

## 2014-10-11 MED ORDER — LEVOTHYROXINE SODIUM 75 MCG PO TABS: 75.0000 ug | ORAL_TABLET | Freq: Every day | ORAL | Status: DC

## 2014-10-11 MED ORDER — ATENOLOL 25 MG PO TABS: 25.0000 mg | ORAL_TABLET | Freq: Two times a day (BID) | ORAL | Status: DC

## 2014-10-20 ENCOUNTER — Ambulatory Visit (HOSPITAL_COMMUNITY)
Admission: RE | Admit: 2014-10-20 | Discharge: 2014-10-20 | Disposition: A | Discharge status: Home / Self Care | Payer: Medicare FFS | Source: Ambulatory Visit | Attending: Internal Medicine | Admitting: Internal Medicine

## 2014-10-22 DIAGNOSIS — B9689 Other specified bacterial agents as the cause of diseases classified elsewhere: Secondary | ICD-10-CM | POA: Diagnosis not present

## 2014-10-22 DIAGNOSIS — L97521 Non-pressure chronic ulcer of other part of left foot limited to breakdown of skin: Secondary | ICD-10-CM | POA: Diagnosis not present

## 2014-10-22 DIAGNOSIS — L03116 Cellulitis of left lower limb: Secondary | ICD-10-CM | POA: Diagnosis not present

## 2014-11-04 ENCOUNTER — Other Ambulatory Visit: Payer: Self-pay | Admitting: Internal Medicine

## 2014-11-04 ENCOUNTER — Ambulatory Visit
Admission: RE | Admit: 2014-11-04 | Discharge: 2014-11-04 | Disposition: A | Discharge status: Home / Self Care | Payer: Medicare FFS | Source: Ambulatory Visit | Attending: Internal Medicine | Admitting: Internal Medicine

## 2014-11-04 DIAGNOSIS — L98499 Non-pressure chronic ulcer of skin of other sites with unspecified severity: Secondary | ICD-10-CM

## 2014-11-08 ENCOUNTER — Ambulatory Visit (HOSPITAL_COMMUNITY)
Admission: RE | Admit: 2014-11-08 | Discharge: 2014-11-08 | Disposition: A | Discharge status: Home / Self Care | Payer: Medicare FFS | Source: Ambulatory Visit | Attending: Internal Medicine | Admitting: Internal Medicine

## 2014-11-08 DIAGNOSIS — M19072 Primary osteoarthritis, left ankle and foot: Secondary | ICD-10-CM | POA: Diagnosis not present

## 2014-11-08 DIAGNOSIS — S91302D Unspecified open wound, left foot, subsequent encounter: Secondary | ICD-10-CM

## 2014-11-08 DIAGNOSIS — S91102D Unspecified open wound of left great toe without damage to nail, subsequent encounter: Secondary | ICD-10-CM

## 2014-11-08 DIAGNOSIS — M79672 Pain in left foot: Secondary | ICD-10-CM | POA: Diagnosis not present

## 2014-11-08 DIAGNOSIS — N289 Disorder of kidney and ureter, unspecified: Secondary | ICD-10-CM | POA: Insufficient documentation

## 2014-11-08 DIAGNOSIS — L539 Erythematous condition, unspecified: Secondary | ICD-10-CM | POA: Insufficient documentation

## 2014-11-08 DIAGNOSIS — M7989 Other specified soft tissue disorders: Secondary | ICD-10-CM | POA: Insufficient documentation

## 2014-11-08 MED ORDER — GADOBENATE DIMEGLUMINE 529 MG/ML IV SOLN
10.0000 mL | Freq: Once | INTRAVENOUS | Status: AC | PRN
  Administered 2014-11-08: 6 mL via INTRAVENOUS

## 2014-11-12 ENCOUNTER — Encounter (HOSPITAL_BASED_OUTPATIENT_CLINIC_OR_DEPARTMENT_OTHER): Payer: Medicare FFS | Attending: Internal Medicine

## 2014-11-12 ENCOUNTER — Other Ambulatory Visit: Payer: Self-pay | Admitting: Internal Medicine

## 2014-11-12 DIAGNOSIS — L97521 Non-pressure chronic ulcer of other part of left foot limited to breakdown of skin: Secondary | ICD-10-CM | POA: Diagnosis not present

## 2014-11-12 DIAGNOSIS — L97411 Non-pressure chronic ulcer of right heel and midfoot limited to breakdown of skin: Secondary | ICD-10-CM | POA: Insufficient documentation

## 2014-11-16 ENCOUNTER — Encounter: Payer: Self-pay | Admitting: Physician Assistant

## 2014-11-16 ENCOUNTER — Ambulatory Visit (INDEPENDENT_AMBULATORY_CARE_PROVIDER_SITE_OTHER): Payer: Medicare HMO | Admitting: Physician Assistant

## 2014-11-16 VITALS — BP 142/80 | HR 76 | Temp 98.6°F | Resp 16 | Ht 65.0 in | Wt 131.0 lb

## 2014-11-16 DIAGNOSIS — G8254 Quadriplegia, C5-C7 incomplete: Secondary | ICD-10-CM

## 2014-11-16 DIAGNOSIS — F419 Anxiety disorder, unspecified: Secondary | ICD-10-CM

## 2014-11-16 DIAGNOSIS — H9193 Unspecified hearing loss, bilateral: Secondary | ICD-10-CM

## 2014-11-16 DIAGNOSIS — G8250 Quadriplegia, unspecified: Secondary | ICD-10-CM

## 2014-11-16 DIAGNOSIS — E559 Vitamin D deficiency, unspecified: Secondary | ICD-10-CM

## 2014-11-16 DIAGNOSIS — I10 Essential (primary) hypertension: Secondary | ICD-10-CM

## 2014-11-16 DIAGNOSIS — R296 Repeated falls: Secondary | ICD-10-CM

## 2014-11-16 DIAGNOSIS — R6889 Other general symptoms and signs: Secondary | ICD-10-CM

## 2014-11-16 DIAGNOSIS — D649 Anemia, unspecified: Secondary | ICD-10-CM

## 2014-11-16 DIAGNOSIS — R918 Other nonspecific abnormal finding of lung field: Secondary | ICD-10-CM

## 2014-11-16 DIAGNOSIS — R059 Cough, unspecified: Secondary | ICD-10-CM

## 2014-11-16 DIAGNOSIS — E782 Mixed hyperlipidemia: Secondary | ICD-10-CM

## 2014-11-16 DIAGNOSIS — Z79899 Other long term (current) drug therapy: Secondary | ICD-10-CM

## 2014-11-16 DIAGNOSIS — I82402 Acute embolism and thrombosis of unspecified deep veins of left lower extremity: Secondary | ICD-10-CM

## 2014-11-16 DIAGNOSIS — Z9181 History of falling: Secondary | ICD-10-CM | POA: Insufficient documentation

## 2014-11-16 DIAGNOSIS — M25562 Pain in left knee: Secondary | ICD-10-CM

## 2014-11-16 DIAGNOSIS — H832X3 Labyrinthine dysfunction, bilateral: Secondary | ICD-10-CM

## 2014-11-16 DIAGNOSIS — L03116 Cellulitis of left lower limb: Secondary | ICD-10-CM

## 2014-11-16 DIAGNOSIS — R05 Cough: Secondary | ICD-10-CM

## 2014-11-16 DIAGNOSIS — R7303 Prediabetes: Secondary | ICD-10-CM

## 2014-11-16 DIAGNOSIS — E038 Other specified hypothyroidism: Secondary | ICD-10-CM

## 2014-11-16 DIAGNOSIS — Z0001 Encounter for general adult medical examination with abnormal findings: Secondary | ICD-10-CM

## 2014-11-16 DIAGNOSIS — N319 Neuromuscular dysfunction of bladder, unspecified: Secondary | ICD-10-CM

## 2014-11-16 DIAGNOSIS — M720 Palmar fascial fibromatosis [Dupuytren]: Secondary | ICD-10-CM

## 2014-11-16 LAB — CBC WITH DIFFERENTIAL/PLATELET
BASOS ABS: 0.1 10*3/uL (ref 0.0–0.1)
BASOS PCT: 1 % (ref 0–1)
EOS ABS: 0.6 10*3/uL (ref 0.0–0.7)
EOS PCT: 7 % — AB (ref 0–5)
HEMATOCRIT: 37.7 % (ref 36.0–46.0)
Hemoglobin: 12.2 g/dL (ref 12.0–15.0)
LYMPHS ABS: 2 10*3/uL (ref 0.7–4.0)
Lymphocytes Relative: 23 % (ref 12–46)
MCH: 27.9 pg (ref 26.0–34.0)
MCHC: 32.4 g/dL (ref 30.0–36.0)
MCV: 86.3 fL (ref 78.0–100.0)
MPV: 10.5 fL (ref 8.6–12.4)
Monocytes Absolute: 0.7 10*3/uL (ref 0.1–1.0)
Monocytes Relative: 8 % (ref 3–12)
NEUTROS ABS: 5.4 10*3/uL (ref 1.7–7.7)
Neutrophils Relative %: 61 % (ref 43–77)
Platelets: 332 10*3/uL (ref 150–400)
RBC: 4.37 MIL/uL (ref 3.87–5.11)
RDW: 15.5 % (ref 11.5–15.5)
WBC: 8.8 10*3/uL (ref 4.0–10.5)

## 2014-11-16 LAB — LIPID PANEL
CHOL/HDL RATIO: 2.5 ratio
CHOLESTEROL: 149 mg/dL (ref 0–200)
HDL: 59 mg/dL (ref >39)
LDL Cholesterol: 65 mg/dL (ref 0–99)
TRIGLYCERIDES: 126 mg/dL (ref <150)
VLDL: 25 mg/dL (ref 0–40)

## 2014-11-16 LAB — TSH: TSH: 2.694 u[IU]/mL (ref 0.350–4.500)

## 2014-11-16 LAB — BASIC METABOLIC PANEL WITH GFR
BUN: 36 mg/dL — ABNORMAL HIGH (ref 6–23)
CO2: 32 mEq/L (ref 19–32)
Calcium: 9.3 mg/dL (ref 8.4–10.5)
Chloride: 103 mEq/L (ref 96–112)
Creat: 1.56 mg/dL — ABNORMAL HIGH (ref 0.50–1.10)
GFR, EST AFRICAN AMERICAN: 36 mL/min — AB
GFR, EST NON AFRICAN AMERICAN: 31 mL/min — AB
Glucose, Bld: 92 mg/dL (ref 70–99)
POTASSIUM: 4.8 meq/L (ref 3.5–5.3)
SODIUM: 141 meq/L (ref 135–145)

## 2014-11-16 LAB — MAGNESIUM: Magnesium: 2.3 mg/dL (ref 1.5–2.5)

## 2014-11-16 LAB — HEPATIC FUNCTION PANEL
ALK PHOS: 76 U/L (ref 39–117)
ALT: 16 U/L (ref 0–35)
AST: 19 U/L (ref 0–37)
Albumin: 3.6 g/dL (ref 3.5–5.2)
BILIRUBIN TOTAL: 0.4 mg/dL (ref 0.2–1.2)
Bilirubin, Direct: 0.1 mg/dL (ref 0.0–0.3)
Indirect Bilirubin: 0.3 mg/dL (ref 0.2–1.2)
TOTAL PROTEIN: 6.4 g/dL (ref 6.0–8.3)

## 2014-11-16 LAB — VITAMIN B12: VITAMIN B 12: 1658 pg/mL — AB (ref 211–911)

## 2014-11-16 MED ORDER — RIVAROXABAN 20 MG PO TABS: 20.0000 mg | ORAL_TABLET | Freq: Every day | ORAL | Status: DC

## 2014-11-16 MED ORDER — ATENOLOL 50 MG PO TABS: 50.0000 mg | ORAL_TABLET | Freq: Every day | ORAL | Status: DC

## 2014-11-16 MED ORDER — DOXYCYCLINE HYCLATE 100 MG PO TABS: 100.0000 mg | ORAL_TABLET | Freq: Two times a day (BID) | ORAL | Status: DC

## 2014-11-16 NOTE — Patient Instructions (Signed)
Use a dropper to put olive oil or canola oil in the effected ear- 2-3 times a week. Let it soak for 20-30 min then you can take a shower or use a baby bulb with warm water to wash out the ear wax.  Do not use Qtips  [employer] with no obligation # [phone] Tues-Sat 10-6  [place]- free test with no obligation # [phone] Call for store hours  Preventative Care for Adults - Female      [place]:  A routine yearly physical is a good way to check in with your primary care provider about your health and preventive screening. It is also an opportunity to share updates about your health and any concerns you have, and receive a thorough all-over exam.   Most health insurance companies pay for at least some preventative services.  Check with your health plan for specific coverages.  WHAT PREVENTATIVE SERVICES DO WOMEN NEED?  Adult women should have their weight and blood pressure checked regularly.   Women age 30 and older should have their cholesterol levels checked regularly.  Women should be screened for cervical cancer with a Pap smear and pelvic exam beginning at either age 59, or 3 years after they become sexually activity.    Breast cancer screening generally begins at age 7 with a mammogram and breast exam by your primary care provider.    Beginning at age 85 and continuing to age 3, women should be screened for colorectal cancer.  Certain people may need continued testing until age 60.  Updating vaccinations is part of preventative care.  Vaccinations help protect against diseases such as the flu.  Osteoporosis is a disease in which the bones lose minerals and strength as we age. Women ages 29 and over should discuss this with their caregivers, as should women after menopause who have other risk factors.  Lab tests are generally done as part of preventative care to screen for anemia and blood disorders, to screen for  problems with the kidneys and liver, to screen for bladder problems, to check blood sugar, and to check your cholesterol level.  Preventative services generally include counseling about diet, exercise, avoiding tobacco, drugs, excessive alcohol consumption, and sexually transmitted infections.    GENERAL RECOMMENDATIONS FOR GOOD HEALTH:  Healthy diet:  Eat a variety of foods, including fruit, vegetables, animal or vegetable protein, such as meat, fish, chicken, and eggs, or beans, lentils, tofu, and grains, such as rice.  Drink plenty of water daily.  Decrease saturated fat in the diet, avoid lots of red meat, processed foods, sweets, fast foods, and fried foods.  Exercise:  Aerobic exercise helps maintain good heart health. At least 30-40 minutes of moderate-intensity exercise is recommended. For example, a brisk walk that increases your heart rate and breathing. This should be done on most days of the week.   Find a type of exercise or a variety of exercises that you enjoy so that it becomes a part of your daily life.  Examples are running, walking, swimming, water aerobics, and biking.  For motivation and support, explore group exercise such as aerobic class, spin class, Zumba, Yoga,or  martial arts, etc.    Set exercise goals for yourself, such as a certain weight goal, walk or run in a race such as a 5k walk/run.  Speak to your primary care provider about exercise goals.  Disease prevention:  If you smoke or chew tobacco, find out  from your caregiver how to quit. It can literally save your life, no matter how long you have been a tobacco user. If you do not use tobacco, never begin.   Maintain a healthy diet and normal weight. Increased weight leads to problems with blood pressure and diabetes.   The Body Mass Index or BMI is a way of measuring how much of your body is fat. Having a BMI above 27 increases the risk of heart disease, diabetes, hypertension, stroke and other problems  related to obesity. Your caregiver can help determine your BMI and based on it develop an exercise and dietary program to help you achieve or maintain this important measurement at a healthful level.  High blood pressure causes heart and blood vessel problems.  Persistent high blood pressure should be treated with medicine if weight loss and exercise do not work.   Fat and cholesterol leaves deposits in your arteries that can block them. This causes heart disease and vessel disease elsewhere in your body.  If your cholesterol is found to be high, or if you have heart disease or certain other medical conditions, then you may need to have your cholesterol monitored frequently and be treated with medication.   Ask if you should have a cardiac stress test if your history suggests this. A stress test is a test done on a treadmill that looks for heart disease. This test can find disease prior to there being a problem.  Menopause can be associated with physical symptoms and risks. Hormone replacement therapy is available to decrease these. You should talk to your caregiver about whether starting or continuing to take hormones is right for you.   Osteoporosis is a disease in which the bones lose minerals and strength as we age. This can result in serious bone fractures. Risk of osteoporosis can be identified using a bone density scan. Women ages 35 and over should discuss this with their caregivers, as should women after menopause who have other risk factors. Ask your caregiver whether you should be taking a calcium supplement and Vitamin D, to reduce the rate of osteoporosis.   Avoid drinking alcohol in excess (more than two drinks per day).  Avoid use of street drugs. Do not share needles with anyone. Ask for professional help if you need assistance or instructions on stopping the use of alcohol, cigarettes, and/or drugs.  Brush your teeth twice a day with fluoride toothpaste, and floss once a day. Good oral  hygiene prevents tooth decay and gum disease. The problems can be painful, unattractive, and can cause other health problems. Visit your dentist for a routine oral and dental check up and preventive care every 6-12 months.   Look at your skin regularly.  Use a mirror to look at your back. Notify your caregivers of changes in moles, especially if there are changes in shapes, colors, a size larger than a pencil eraser, an irregular border, or development of new moles.  Safety:  Use seatbelts 100% of the time, whether driving or as a passenger.  Use safety devices such as hearing protection if you work in environments with loud noise or significant background noise.  Use safety glasses when doing any work that could send debris in to the eyes.  Use a helmet if you ride a bike or motorcycle.  Use appropriate safety gear for contact sports.  Talk to your caregiver about gun safety.  Use sunscreen with a SPF (or skin protection factor) of 15 or greater.  Lighter  skinned people are at a greater risk of skin cancer. Don't forget to also wear sunglasses in order to protect your eyes from too much damaging sunlight. Damaging sunlight can accelerate cataract formation.   Practice safe sex. Use condoms. Condoms are used for birth control and to help reduce the spread of sexually transmitted infections (or STIs).  Some of the STIs are gonorrhea (the clap), chlamydia, syphilis, trichomonas, herpes, HPV (human papilloma virus) and HIV (human immunodeficiency virus) which causes AIDS. The herpes, HIV and HPV are viral illnesses that have no cure. These can result in disability, cancer and death.   Keep carbon monoxide and smoke detectors in your home functioning at all times. Change the batteries every 6 months or use a model that plugs into the wall.   Vaccinations:  Stay up to date with your tetanus shots and other required immunizations. You should have a booster for tetanus every 10 years. Be sure to get your  flu shot every year, since 5%-20% of the [place] population comes down with the flu. The flu vaccine changes each year, so being vaccinated once is not enough. Get your shot in the fall, before the flu season peaks.   Other vaccines to consider:  Human Papilloma Virus or HPV causes cancer of the cervix, and other infections that can be transmitted from person to person. There is a vaccine for HPV, and females should get immunized between the ages of 32 and 15. It requires a series of 3 shots.   Pneumococcal vaccine to protect against certain types of pneumonia.  This is normally recommended for adults age 79 or older.  However, adults younger than 79 years old with certain underlying conditions such as diabetes, heart or lung disease should also receive the vaccine.  Shingles vaccine to protect against Varicella Zoster if you are older than age 41, or younger than 79 years old with certain underlying illness.  Hepatitis A vaccine to protect against a form of infection of the liver by a virus acquired from food.  Hepatitis B vaccine to protect against a form of infection of the liver by a virus acquired from blood or body fluids, particularly if you work in health care.  If you plan to travel internationally, check with your local health department for specific vaccination recommendations.  Cancer Screening:  Breast cancer screening is essential to preventive care for women. All women age 23 and older should perform a breast self-exam every month. At age 33 and older, women should have their caregiver complete a breast exam each year. Women at ages 31 and older should have a mammogram (x-ray film) of the breasts. Your caregiver can discuss how often you need mammograms.    Cervical cancer screening includes taking a Pap smear (sample of cells examined under a microscope) from the cervix (end of the uterus). It also includes testing for HPV (Human Papilloma Virus, which can cause cervical cancer).  Screening and a pelvic exam should begin at age 69, or 3 years after a woman becomes sexually active. Screening should occur every year, with a Pap smear but no HPV testing, up to age 35. After age 50, you should have a Pap smear every 3 years with HPV testing, if no HPV was found previously.   Most routine colon cancer screening begins at the age of 13. On a yearly basis, doctors may provide special easy to use take-home tests to check for hidden blood in the stool. Sigmoidoscopy or colonoscopy can detect the earliest  forms of colon cancer and is life saving. These tests use a small camera at the end of a tube to directly examine the colon. Speak to your caregiver about this at age 103, when routine screening begins (and is repeated every 5 years unless early forms of pre-cancerous polyps or small growths are found).

## 2014-11-16 NOTE — Progress Notes (Signed)
MEDICARE ANNUAL WELLNESS VISIT AND CPE  Assessment:   1. At high risk for falls Can not do PT at this time  2. Essential hypertension May need to switch ACE--> ARB - atenolol (TENORMIN) 50 MG tablet; Take 1 tablet (50 mg total) by mouth at bedtime.  Dispense: 90 tablet; Refill: 1 - CBC with Differential - BASIC METABOLIC PANEL WITH GFR - Hepatic function panel  3. Left leg DVT Has had DVT x 2, once after fall 2 years ago with cervical fractures and once recently while immobile with infection- in addition with lung cancer suspected puts at increased risk hypercoagulable state, suggest staying on anticoagulant until after infection better.  Granddaughter declines switch to coumadin for cost Will try to get samples of xarelto.   4. Other specified hypothyroidism Hypothyroidism-check TSH level, continue medications the same, reminded to take on an empty stomach 30-27mins before food.  - TSH  5. Incomplete quadriplegia at C5-6 level Walks with walker, not doing PT at this time due to foot ulcer/cellutlits  6. Vestibular dysfunction, bilateral Remission, meclizine PRN  7. Tetraplegia Can not continue PT until cellulitis/foot is better, has stopped for now  8. Dupuytren's contracture of both hands Continue follow up with ortho  9. Lung mass Likely cancer but at this time the patient and granddaughter do not want it treated Monitor  10. Anxiety remission  11. Hyperlipidemia Stop the lipitor for now, patient is frustrated with overmedication - Lipid panel  12. Neurogenic bladder Continue follow up with urology, check urine  13. Vitamin D deficiency - Vit D  25 hydroxy (rtn osteoporosis monitoring)  14. Medication management - Magnesium- switch from 2 a day to 400mg  once daily  15. Cellulitis of left lower extremity Continue follow up wound center - doxycycline (VIBRA-TABS) 100 MG tablet; Take 1 tablet (100 mg total) by mouth 2 (two) times daily.  Dispense: 20 tablet;  Refill: 0  16. Cough Continue doxy, if not better will switch lisinopril to ARB  17. Anemia, unspecified anemia type - Vitamin B12  18. Hearing loss, bilateral Get hearing test and hearing aids  19. Prediabetes Discussed general issues about diabetes pathophysiology and management., Educational material distributed., Suggested low cholesterol diet., Encouraged aerobic exercise., Discussed foot care., Reminded to get yearly retinal exam. - Hemoglobin A1c  20. Left knee pain Refer to Dr. Sharol Given for pain   Plan:   During the course of the visit the patient was educated and counseled about appropriate screening and preventive services including:    Pneumococcal vaccine   Influenza vaccine  Td vaccine  Screening electrocardiogram  Bone densitometry screening  Colorectal cancer screening  Diabetes screening  Glaucoma screening  Nutrition counseling   Advanced directives: requested  Screening recommendations, referrals: Vaccinations: Please see documentation below and orders this visit.  Nutrition assessed and recommended  Colonoscopy declined Recommended yearly ophthalmology/optometry visit for glaucoma screening and checkup Recommended yearly dental visit for hygiene and checkup Advanced directives - requested  Conditions/risks identified: BMI: Discussed weight loss, diet, and increase physical activity.  Increase physical activity: AHA recommends 150 minutes of physical activity a week.  Medications reviewed Diabetes is at goal, ACE/ARB therapy: Yes. Urinary Incontinence is an issue: discussed non pharmacology and pharmacology options.  Fall risk: high- discussed PT, home fall assessment, medications.    Subjective:  Cassandra Walker is a 81 y.o. female who presents for Medicare Annual Wellness Visit and complete physical.  Date of last medicare wellness visit is 01/27/2014   Her blood  pressure has been controlled at home, she is on lisinopril 20mg  and  atenolol 25mg  BID, today their BP is BP: (!) 142/80 mmHg She does workout. She denies chest pain, shortness of breath, dizziness. Has had non productive cough/runny nose for 2-3 weeks, on mucinex.  She is on cholesterol medication, lipitor 10mg  daily and denies myalgias. Her cholesterol is at goal. The cholesterol last visit was:   Lab Results  Component Value Date   CHOL 136 07/22/2014   HDL 54 07/22/2014   LDLCALC 67 07/22/2014   TRIG 75 07/22/2014   CHOLHDL 2.5 07/22/2014  She has been working on diet and exercise for prediabetes, and denies polydipsia, polyuria and visual disturbances. Last A1C in the office was:  Lab Results  Component Value Date   HGBA1C 6.0* 07/22/2014  Patient is on Vitamin D supplement.   Lab Results  Component Value Date   VD25OH 103* 07/22/2014  She is on thyroid medication. Her medication was not changed last visit.  Lab Results  Component Value Date   TSH 4.362 07/22/2014  She has a history of DVT, she was put on it after her accident 2 years ago that left her with partial peripaligia and neurogenic bladder and then again in august due to infection/immobility and is on Xarelto, this is expensive however she does not want coumadin. Declines CP, SOB, leg swelling.  She has been following wound center for cellulitis/wound of left foot, negative MRI for osteomyelitis and just started on doxy last week. Grand daughter states only pain when she walks on it, and that she has been having some pain with it if her foot is warm, better when it is cold. Feeling better since on doxy.  She has a history of vestibular dysfunction and occ takes meclizine for this.  She follows with Dr. Gwenette Greet for possible lung mass in left lower lobe, he favors slow growing bronchogenic cancer, she decided to NOT do PET scan and prefers to not pursue treatment. States that she is more interested in quality of life and does not want to treat it if it is cancer.  She has trouble hearing, wants  to get hearing test.  She has some knee pain in her right knee, walking with walker, wants to get corticosteroid injection with Dr. Sharol Given.  She has been fatigued, use to be on B12 injections, granddaughter and power of attorney put her on B12 pills.   Names of Other Physician/Practitioners you currently use: 1. [facility] here for primary care Patient Care Team: Unk Pinto, MD as PCP - General (Internal Medicine) Exie Parody, OD as Physician Assistant (Optometry) Charlotte Crumb, MD as Consulting Physician (Orthopedic Surgery) Clent Jacks, MD as Consulting Physician (Ophthalmology) Winfield Cunas., MD as Consulting Physician (Gastroenterology) Newt Minion, MD as Consulting Physician (Orthopedic Surgery) Tanda Rockers, MD as Consulting Physician (Pulmonary Disease) Kathee Delton, MD as Consulting Physician (Pulmonary Disease)   Medication Review: Current Outpatient Prescriptions on File Prior to Visit  Medication Sig Dispense Refill  . Cholecalciferol (VITAMIN D3 HIGH POTENCY PO) Take 5,000 Units by mouth daily.    . fish oil-omega-3 fatty acids 1000 MG capsule Take 1 g by mouth daily.     Marland Kitchen levothyroxine (SYNTHROID, LEVOTHROID) 75 MCG tablet Take 1 tablet (75 mcg total) by mouth daily. 90 tablet 2  . lisinopril (PRINIVIL,ZESTRIL) 20 MG tablet Take 1 tablet (20 mg total) by mouth daily. 90 tablet 2  . loratadine (CLARITIN) 10  MG tablet Take 10 mg by mouth daily as needed for allergies.    . Magnesium 250 MG TABS Take 400 mg by mouth daily.    . meclizine (ANTIVERT) 25 MG tablet TAKE 1 TABLET BY MOUTH THREE TIMES DAILY AS NEEDED 90 tablet 1  . meloxicam (MOBIC) 15 MG tablet Take 1 tablet (15 mg total) by mouth daily. For Pain and inflammation 90 tablet 99  . ondansetron (ZOFRAN ODT) 4 MG disintegrating tablet Take 1 tablet (4 mg total) by mouth every 8 (eight) hours as needed for nausea. 6 tablet 0  . rivaroxaban (XARELTO) 20  MG TABS tablet Take 1 tablet (20 mg total) by mouth daily with supper. 30 tablet 2  . traMADol (ULTRAM) 50 MG tablet TAKE 1/2-1 TABLET BY MOUTH THREE TIMES DAILY AS NEEDED FOR PAIN 90 tablet 0   No current facility-administered medications on file prior to visit.    Current Problems (verified) Patient Active Problem List   Diagnosis Date Noted  . At high risk for falls 11/16/2014  . Cellulitis 05/29/2014  . Acute lymphangitis of left lower extremity 05/29/2014  . Vitamin D deficiency 04/29/2014  . Medication management 04/29/2014  . T2_NIDDM w/Stage 3 CKD (GFR 53 ml/min) 04/29/2014  . Neurogenic bladder 01/11/2014  . Tetraplegia 10/13/2013  . Hypothyroidism 06/29/2013  . Anxiety 06/29/2013  . Hyperlipidemia 06/29/2013  . Dupuytren's contracture of both hands 06/19/2013  . Left leg DVT 05/06/2013  . Vestibular dysfunction 04/09/2013  . Incomplete quadriplegia at C5-6 level 03/10/2013  . HTN (hypertension) 03/10/2013  . Lung mass 07/24/2011    Screening Tests Health Maintenance  Topic Date Due  . FOOT EXAM  06/10/1942  . OPHTHALMOLOGY EXAM  06[birth year]10/1942  . URINE MICROALBUMIN  06/10/1944  . COLONOSCOPY  06/10/1984  . ZOSTAVAX  06/10/1994  . DEXA SCAN  06/11/1999  . TETANUS/TDAP  11/05/2006  . INFLUENZA VACCINE  02/03/2015 (Originally 06/05/2014)  . HEMOGLOBIN A1C  01/20/2015  . PNEUMOCOCCAL POLYSACCHARIDE VACCINE AGE 46 AND OVER  Completed    Immunization History  Administered Date(s) Administered  . Influenza Split 07/23/2013  . Pneumococcal Polysaccharide-23 06/27/2011  . Td 11/05/1996    Preventative care: Last colonoscopy: REFUSES Last mammogram: PER patient 2006 Last pap smear/pelvic exam: remote, declines another  DEXA: 2011 osteoporsis- declines CT chest 05/2014 CT cervical spine- 07/2013 ABI- 2014 Foot Xray 10/2014- normal MRI- Left foot- IMPRESSION: 1. Dorsal subcutaneous edema and infiltrative enhancement at the level of the distal metatarsals and  extending into the toes, favoring cellulitis, without abscess or osteomyelitis identified. No findings of septic joint. 2. Degenerative arthropathy in the great toe.  Prior vaccinations: TD or Tdap: 2013  Influenza: 2014- declines  Pneumococcal: 2012 Prevnar13: DUE- out of in the office Shingles/Zostavax: declines due to cost    Medication List       This list is accurate as of: 11/16/14  2:59 PM.  Always use your most recent med list.               atenolol 50 MG tablet  Commonly known as:  TENORMIN  Take 1 tablet (50 mg total) by mouth at bedtime.     doxycycline 100 MG tablet  Commonly known as:  VIBRA-TABS  Take 1 tablet (100 mg total) by mouth 2 (two) times daily.     fish oil-omega-3 fatty acids 1000 MG capsule  Take 1 g by mouth daily.     levothyroxine 75 MCG tablet  Commonly known as:  SYNTHROID, LEVOTHROID  Take 1 tablet (75 mcg total) by mouth daily.     lisinopril 20 MG tablet  Commonly known as:  PRINIVIL,ZESTRIL  Take 1 tablet (20 mg total) by mouth daily.     loratadine 10 MG tablet  Commonly known as:  CLARITIN  Take 10 mg by mouth daily as needed for allergies.     Magnesium 250 MG Tabs  Take 400 mg by mouth daily.     meclizine 25 MG tablet  Commonly known as:  ANTIVERT  TAKE 1 TABLET BY MOUTH THREE TIMES DAILY AS NEEDED     meloxicam 15 MG tablet  Commonly known as:  MOBIC  Take 1 tablet (15 mg total) by mouth daily. For Pain and inflammation     ondansetron 4 MG disintegrating tablet  Commonly known as:  ZOFRAN ODT  Take 1 tablet (4 mg total) by mouth every 8 (eight) hours as needed for nausea.     rivaroxaban 20 MG Tabs tablet  Commonly known as:  XARELTO  Take 1 tablet (20 mg total) by mouth daily with supper.     traMADol 50 MG tablet  Commonly known as:  ULTRAM  TAKE 1/2-1 TABLET BY MOUTH THREE TIMES DAILY AS NEEDED FOR PAIN     VITAMIN D3 HIGH POTENCY PO  Take 5,000 Units by mouth daily.        Past Surgical History   Procedure Laterality Date  . Thyroidectomy    . Total abdominal hysterectomy w/ bilateral salpingoophorectomy  1968  . Colon resection  1977  . Cervical laminectomy  1995  . Cataract extraction      bilateral  . Breast enhancement surgery      silicone  . Breast surgery    . Posterior cervical fusion/foraminotomy N/A 03/07/2013    Procedure: Cervical four -seven POSTERIOR CERVICAL FUSION;  Surgeon: Elaina Hoops, MD;  Location: [facility];  Service: Neurosurgery;  Laterality: N/A;   Family History  Problem Relation Age of Onset  . Lung cancer Father     History reviewed: allergies, current medications, past family history, past medical history, past social history, past surgical history and problem list   Risk Factors: Osteoporosis/FallRisk: postmenopausal estrogen deficiency and dietary calcium and/or vitamin D deficiency In the past year have you fallen or had a near fall?:No History of fracture in the past year: no  Tobacco History  Substance Use Topics  . Smoking status: Former Smoker -- 1.00 packs/day for 20 years    Quit date: 11/05/1972  . Smokeless tobacco: Never Used  . Alcohol Use: No   She does not smoke.  Patient is a former smoker. Are there smokers in your home (other than you)?  No  Alcohol Current alcohol use: none  Caffeine Current caffeine use: coffee 1 /day  Exercise Current exercise: exercise intolerance  Nutrition/Diet Current diet: in general, a "healthy" diet    Cardiac risk factors: advanced age (older than 31 for men, 10 for women), hypertension and sedentary lifestyle.  Depression Screen (Note: if answer to either of the following is "Yes", a more complete depression screening is indicated)   Q1: Over the past two weeks, have you felt down, depressed or hopeless? No  Q2: Over the past two weeks, have you felt little interest or pleasure in doing things? No  Have you lost interest or pleasure in daily life? No  Do you often feel  hopeless? No  Do you cry easily over simple problems? No  Activities of Daily  Living In your present state of health, do you have any difficulty performing the following activities?:  Driving? Yes Managing money?  Yes Feeding yourself? No Getting from bed to chair? No Climbing a flight of stairs? No Preparing food and eating?: Yes Bathing or showering? No Getting dressed: No Getting to the toilet? No Using the toilet:No Moving around from place to place: Yes In the past year have you fallen or had a near fall?:Yes   Are you sexually active?  No  Do you have more than one partner?  No  Vision Difficulties: No  Hearing Difficulties: Yes Do you often ask people to speak up or repeat themselves? Yes Do you experience ringing or noises in your ears? No Do you have difficulty understanding soft or whispered voices? Yes  Cognition  Do you feel that you have a problem with memory?Yes  Do you often misplace items? No  Do you feel safe at home?  Yes  Advanced directives Does patient have a [place]? Yes Does patient have a Living Will? Yes   Objective:     Blood pressure 142/80, pulse 76, temperature 98.6 F (37 C), resp. rate 16, height 5\' 5"  (1.651 m), weight 131 lb (59.421 kg). Body mass index is 21.8 kg/(m^2).  General appearance: alert, no distress, WD/WN, female Cognitive Testing  Alert? Yes  Normal Appearance?Yes  Oriented to person? Yes  Place? Yes   Time? Yes  Recall of three objects?  Yes  Can perform simple calculations? Yes  Displays appropriate judgment?Yes  Can read the correct time from a watch face?Yes  HEENT: normocephalic, sclerae anicteric, right ear cerumen impaction removed manually,TMs pearly, nares patent, no discharge or erythema, pharynx normal Oral cavity: MMM, no lesions Neck: supple, no lymphadenopathy, no thyromegaly, no masses Heart: RRR, normal S1, S2, no murmurs Lungs: decreased/coarse breath sounds diffuse no  wheezes, rhonchi, or rales Abdomen: +bs, soft, non tender, non distended, no masses, no hepatomegaly, no splenomegaly Musculoskeletal: nontender, no swelling, no obvious deformity, left foot in boot and wrapped. Extremities: no edema, no cyanosis, no clubbing Pulses: 2+ symmetric, upper and lower extremities Neurological: alert, oriented x 3, CN2-12 intact, strength 4/5 upper extremities and lower extremities,  DTRs 2+ throughout, no cerebellar signs, gait antalgic with walker Psychiatric: normal affect, behavior normal, pleasant   Medicare Attestation I have personally reviewed: The patient's medical and social history Their use of alcohol, tobacco or illicit drugs Their current medications and supplements The patient's functional ability including ADLs,fall risks, home safety risks, cognitive, and hearing and visual impairment Diet and physical activities Evidence for depression or mood disorders  The patient's weight, height, BMI, and visual acuity have been recorded in the chart.  I have made referrals, counseling, and provided education to the patient based on review of the above and I have provided the patient with a written personalized care plan for preventive services.     Vicie Mutters, PA-C   11/16/2014

## 2014-11-17 LAB — URINALYSIS, MICROSCOPIC ONLY
CRYSTALS: NONE SEEN
Casts: NONE SEEN
Squamous Epithelial / LPF: NONE SEEN

## 2014-11-17 LAB — URINALYSIS, ROUTINE W REFLEX MICROSCOPIC
Bilirubin Urine: NEGATIVE
Glucose, UA: NEGATIVE mg/dL
Hgb urine dipstick: NEGATIVE
Ketones, ur: NEGATIVE mg/dL
NITRITE: NEGATIVE
Protein, ur: NEGATIVE mg/dL
SPECIFIC GRAVITY, URINE: 1.014 (ref 1.005–1.030)
UROBILINOGEN UA: 0.2 mg/dL (ref 0.0–1.0)
pH: 7 (ref 5.0–8.0)

## 2014-11-17 LAB — MICROALBUMIN / CREATININE URINE RATIO
Creatinine, Urine: 171.7 mg/dL
MICROALB UR: 1.9 mg/dL (ref <2.0)
Microalb Creat Ratio: 11.1 mg/g (ref 0.0–30.0)

## 2014-11-17 LAB — URINE CULTURE
Colony Count: NO GROWTH
Organism ID, Bacteria: NO GROWTH

## 2014-11-17 LAB — VITAMIN D 25 HYDROXY (VIT D DEFICIENCY, FRACTURES): Vit D, 25-Hydroxy: 90 ng/mL (ref 30–100)

## 2014-11-17 LAB — HEMOGLOBIN A1C
Hgb A1c MFr Bld: 6.3 % — ABNORMAL HIGH (ref <5.7)
Mean Plasma Glucose: 134 mg/dL — ABNORMAL HIGH (ref <117)

## 2014-11-18 ENCOUNTER — Other Ambulatory Visit: Payer: Self-pay

## 2014-11-19 DIAGNOSIS — L97411 Non-pressure chronic ulcer of right heel and midfoot limited to breakdown of skin: Secondary | ICD-10-CM | POA: Diagnosis not present

## 2014-11-19 DIAGNOSIS — L97521 Non-pressure chronic ulcer of other part of left foot limited to breakdown of skin: Secondary | ICD-10-CM | POA: Diagnosis not present

## 2014-12-10 ENCOUNTER — Encounter (HOSPITAL_BASED_OUTPATIENT_CLINIC_OR_DEPARTMENT_OTHER): Payer: Medicare FFS | Attending: Internal Medicine

## 2014-12-10 ENCOUNTER — Other Ambulatory Visit: Payer: Self-pay | Admitting: Emergency Medicine

## 2015-01-07 ENCOUNTER — Ambulatory Visit: Payer: Medicare HMO | Admitting: Internal Medicine

## 2015-01-07 MED ORDER — PREDNISONE 20 MG PO TABS: ORAL_TABLET | ORAL | Status: DC

## 2015-01-07 MED ORDER — BISOPROLOL-HYDROCHLOROTHIAZIDE 10-6.25 MG PO TABS: ORAL_TABLET | ORAL | Status: DC

## 2015-01-07 MED ORDER — HYDROCODONE-ACETAMINOPHEN 5-325 MG PO TABS: ORAL_TABLET | ORAL | Status: DC

## 2015-01-07 MED ORDER — TIZANIDINE HCL 4 MG PO TABS: 4.0000 mg | ORAL_TABLET | Freq: Three times a day (TID) | ORAL | Status: DC

## 2015-01-07 MED ORDER — ATENOLOL 50 MG PO TABS: 50.0000 mg | ORAL_TABLET | Freq: Every day | ORAL | Status: DC

## 2015-01-07 MED ORDER — LISINOPRIL-HYDROCHLOROTHIAZIDE 20-12.5 MG PO TABS: ORAL_TABLET | ORAL | Status: DC

## 2015-01-07 MED ORDER — BISOPROLOL-HYDROCHLOROTHIAZIDE 5-6.25 MG PO TABS: ORAL_TABLET | ORAL | Status: DC

## 2015-01-07 NOTE — Progress Notes (Signed)
Patient ID: Cassandra Walker, female   DOB: [birth year], 81 y.o.   MRN: [id number]  [facility]

## 2015-01-13 ENCOUNTER — Other Ambulatory Visit: Payer: Self-pay | Admitting: Internal Medicine

## 2015-03-17 ENCOUNTER — Other Ambulatory Visit: Payer: Self-pay | Admitting: Internal Medicine

## 2015-03-31 ENCOUNTER — Ambulatory Visit (HOSPITAL_COMMUNITY)
Admission: RE | Admit: 2015-03-31 | Discharge: 2015-03-31 | Disposition: A | Discharge status: Home / Self Care | Payer: Medicare HMO | Source: Ambulatory Visit | Attending: Internal Medicine | Admitting: Internal Medicine

## 2015-03-31 ENCOUNTER — Encounter: Payer: Self-pay | Admitting: Internal Medicine

## 2015-03-31 ENCOUNTER — Ambulatory Visit (INDEPENDENT_AMBULATORY_CARE_PROVIDER_SITE_OTHER): Payer: Medicare HMO | Admitting: Internal Medicine

## 2015-03-31 VITALS — BP 142/62 | HR 70 | Temp 98.2°F | Resp 16 | Ht 65.0 in | Wt 139.0 lb

## 2015-03-31 DIAGNOSIS — E559 Vitamin D deficiency, unspecified: Secondary | ICD-10-CM

## 2015-03-31 DIAGNOSIS — E1122 Type 2 diabetes mellitus with diabetic chronic kidney disease: Secondary | ICD-10-CM

## 2015-03-31 DIAGNOSIS — J069 Acute upper respiratory infection, unspecified: Secondary | ICD-10-CM

## 2015-03-31 DIAGNOSIS — E785 Hyperlipidemia, unspecified: Secondary | ICD-10-CM

## 2015-03-31 DIAGNOSIS — J449 Chronic obstructive pulmonary disease, unspecified: Secondary | ICD-10-CM | POA: Insufficient documentation

## 2015-03-31 DIAGNOSIS — Z79899 Other long term (current) drug therapy: Secondary | ICD-10-CM

## 2015-03-31 DIAGNOSIS — R5383 Other fatigue: Secondary | ICD-10-CM

## 2015-03-31 DIAGNOSIS — R918 Other nonspecific abnormal finding of lung field: Secondary | ICD-10-CM | POA: Diagnosis not present

## 2015-03-31 DIAGNOSIS — R0602 Shortness of breath: Secondary | ICD-10-CM | POA: Diagnosis present

## 2015-03-31 DIAGNOSIS — E038 Other specified hypothyroidism: Secondary | ICD-10-CM

## 2015-03-31 DIAGNOSIS — R0600 Dyspnea, unspecified: Secondary | ICD-10-CM

## 2015-03-31 DIAGNOSIS — I10 Essential (primary) hypertension: Secondary | ICD-10-CM

## 2015-03-31 LAB — CBC WITH DIFFERENTIAL/PLATELET
Basophils Absolute: 0.1 10*3/uL (ref 0.0–0.1)
Basophils Relative: 1 % (ref 0–1)
EOS ABS: 0.7 10*3/uL (ref 0.0–0.7)
EOS PCT: 6 % — AB (ref 0–5)
HCT: 34.8 % — ABNORMAL LOW (ref 36.0–46.0)
Hemoglobin: 11.4 g/dL — ABNORMAL LOW (ref 12.0–15.0)
Lymphocytes Relative: 20 % (ref 12–46)
Lymphs Abs: 2.2 10*3/uL (ref 0.7–4.0)
MCH: 27.6 pg (ref 26.0–34.0)
MCHC: 32.8 g/dL (ref 30.0–36.0)
MCV: 84.3 fL (ref 78.0–100.0)
MONOS PCT: 10 % (ref 3–12)
MPV: 10.7 fL (ref 8.6–12.4)
Monocytes Absolute: 1.1 10*3/uL — ABNORMAL HIGH (ref 0.1–1.0)
Neutro Abs: 7 10*3/uL (ref 1.7–7.7)
Neutrophils Relative %: 63 % (ref 43–77)
Platelets: 323 10*3/uL (ref 150–400)
RBC: 4.13 MIL/uL (ref 3.87–5.11)
RDW: 13.9 % (ref 11.5–15.5)
WBC: 11.1 10*3/uL — ABNORMAL HIGH (ref 4.0–10.5)

## 2015-03-31 MED ORDER — TRAMADOL HCL 50 MG PO TABS: ORAL_TABLET | ORAL | Status: DC

## 2015-03-31 MED ORDER — BENZONATATE 100 MG PO CAPS: 100.0000 mg | ORAL_CAPSULE | Freq: Four times a day (QID) | ORAL | Status: DC | PRN

## 2015-03-31 MED ORDER — PREDNISONE 20 MG PO TABS: ORAL_TABLET | ORAL | Status: DC

## 2015-03-31 MED ORDER — LEVOFLOXACIN 750 MG PO TABS: 750.0000 mg | ORAL_TABLET | Freq: Every day | ORAL | Status: DC

## 2015-03-31 NOTE — Progress Notes (Signed)
Patient ID: Cassandra Walker, female   DOB: 01/10/1932, 81 y.o.   MRN: [id number]  Assessment and Plan:   1. Essential hypertension -cont meds -monitor at home -DASH diet  2. Other specified hypothyroidism   3. Type 2 diabetes mellitus with diabetic chronic kidney disease  - Hemoglobin A1c - Insulin, random  4. Vitamin D deficiency -cont supplement - Vit D  25 hydroxy (rtn osteoporosis monitoring)  5. Medication management  - CBC with Differential/Platelet - BASIC METABOLIC PANEL WITH GFR - Hepatic function panel - Magnesium  6. Acute URI -concern for CHF vs. Infection vs. Increase in pulmonary nodule - DG Chest 2 View; Future - benzonatate (TESSALON PERLES) 100 MG capsule; Take 1 capsule (100 mg total) by mouth every 6 (six) hours as needed for cough.  Dispense: 30 capsule; Refill: 1 - predniSONE (DELTASONE) 20 MG tablet; 3 tabs po day one, then 2 tabs daily x 4 days  Dispense: 11 tablet; Refill: 0 - levofloxacin (LEVAQUIN) 750 MG tablet; Take 1 tablet (750 mg total) by mouth daily. X 7 days  Dispense: 7 tablet; Refill: 0  7. Dyspnea -concern for CHF with dry cough - Pro b natriuretic peptide (BNP)  8. Hyperlipidemia -cont meds - Lipid panel  9. Other fatigue -check for source of leg pain - Iron and TIBC - Vitamin B12     Continue diet and meds as discussed. Further disposition pending results of labs. Discussed med's effects and SE's.    HPI 81 y.o. female  presents for 3 month follow up with hypertension, hyperlipidemia, diabetes and vitamin D deficiency.   Her blood pressure has been controlled at home, today their BP is BP: (!) 142/62 mmHg.She does not workout. She denies chest pain, shortness of breath, dizziness.   She is on cholesterol medication and denies myalgias. Her cholesterol is at goal. The cholesterol was:  11/16/2014: Cholesterol, Total 149; HDL-C 59; LDL (calc) 65; Triglycerides 126   She has not been working on diet and exercise for diabetes  with diabetic chronic kidney disease, she is not on bASA, she is not on ACE/ARB, and denies  foot ulcerations, hyperglycemia, hypoglycemia , increased appetite, nausea, paresthesia of the feet, polydipsia, polyuria, visual disturbances, vomiting and weight loss. Last A1C was: 11/16/2014: Hemoglobin-A1c 6.3*   Patient is on Vitamin D supplement. 11/16/2014: VITD 90  Patient has been having a cough for the past 1 month.  They have tried OTC cough syrups and medications which have not helped much.  She has had some mild sputum production that is mostly clear.  She reports that she has not had a lot of coughing like this in the past.    Current Medications:  Current Outpatient Prescriptions on File Prior to Visit  Medication Sig Dispense Refill  . atorvastatin (LIPITOR) 10 MG tablet TAKE 1 TABLET BY MOUTH EVERY EVENING 90 tablet 0  . Cholecalciferol (VITAMIN D3 HIGH POTENCY PO) Take 5,000 Units by mouth daily.    . fish oil-omega-3 fatty acids 1000 MG capsule Take 1 g by mouth daily.     Marland Kitchen levothyroxine (SYNTHROID, LEVOTHROID) 75 MCG tablet Take 1 tablet (75 mcg total) by mouth daily. 90 tablet 2  . meloxicam (MOBIC) 15 MG tablet Take 1 tablet (15 mg total) by mouth daily. For Pain and inflammation 90 tablet 99  . rivaroxaban (XARELTO) 20 MG TABS tablet Take 1 tablet (20 mg total) by mouth daily with supper. 30 tablet 0  . traMADol (ULTRAM) 50 MG tablet TAKE 1/2-1  TABLET BY MOUTH THREE TIMES DAILY AS NEEDED FOR PAIN 90 tablet 0  . atenolol (TENORMIN) 25 MG tablet   2  . atenolol (TENORMIN) 50 MG tablet Take 1 tablet (50 mg total) by mouth daily. 30 tablet 11   No current facility-administered medications on file prior to visit.   Medical History:  Past Medical History  Diagnosis Date  . Osteoarthritis   . Hyperlipidemia   . GERD (gastroesophageal reflux disease)   . Peripheral neuropathy   . Osteopenia   . DDD (degenerative disc disease)   . Shingles   . Colon cancer 1977  . Thyroid cancer  1966  . Peripheral vascular disease   . Deep vein blood clot of left lower extremity 05/06/13   Allergies:  Allergies  Allergen Reactions  . Codeine Other (See Comments)    Jittery, nervous & hallucinations  . Oxybutynin     Tired     Review of Systems:  Review of Systems  Constitutional: Positive for malaise/fatigue. Negative for fever, chills and weight loss.  HENT: Negative for congestion, nosebleeds and sore throat.   Respiratory: Positive for cough, sputum production, shortness of breath and wheezing.   Cardiovascular: Positive for leg swelling. Negative for chest pain.  Gastrointestinal: Positive for diarrhea. Negative for heartburn, nausea, vomiting, constipation, blood in stool and melena.  Genitourinary: Positive for urgency and frequency. Negative for dysuria.  Musculoskeletal: Positive for myalgias.  Skin: Negative.   Neurological: Positive for sensory change. Negative for dizziness, loss of consciousness and headaches.  Psychiatric/Behavioral: Negative for depression. The patient is not nervous/anxious and does not have insomnia.     Family history- Review and unchanged  Social history- Review and unchanged  Physical Exam: BP 142/62 mmHg  Pulse 70  Temp(Src) 98.2 F (36.8 C) (Temporal)  Resp 16  Ht '5\' 5"'$  (1.651 m)  Wt 139 lb (63.05 kg)  BMI 23.13 kg/m2  SpO2 96% Wt Readings from Last 3 Encounters:  03/31/15 139 lb (63.05 kg)  11/16/14 131 lb (59.421 kg)  11/08/14 131 lb (59.421 kg)   General Appearance: Well nourished well developed, non-toxic appearing, in no apparent distress. Eyes: PERRLA, EOMs, conjunctiva no swelling or erythema ENT/Mouth: Ear canals clear with no erythema, swelling, or discharge.  TMs normal bilaterally, oropharynx clear, moist, with no exudate.   Neck: Supple, thyroid normal, no JVD, no cervical adenopathy.  Respiratory: Respiratory effort normal, mildly decreased breath sounds, but clear A&P, no wheeze, rhonchi or rales noted.  No  retractions, no accessory muscle usage Cardio: RRR with no MRGs. 2+ edema of the bilateral lower extremity pretibial Abdomen: Soft, + BS.  Minimally tender to palpation, no guarding, rebound, hernias, masses. Musculoskeletal: Full ROM, 5/5 strength, antalgic with walker Skin: Warm, dry without rashes, lesions, ecchymosis.  Neuro: Awake and oriented X 3, Cranial nerves intact. No cerebellar symptoms.  Psych: normal affect, Insight and Judgment appropriate.    FORCUCCI, Gamble Enderle, PA-C 3:06 PM [facility]

## 2015-03-31 NOTE — Patient Instructions (Signed)
Upper Respiratory Infection, Adult An upper respiratory infection (URI) is also sometimes known as the common cold. The upper respiratory tract includes the nose, sinuses, throat, trachea, and bronchi. Bronchi are the airways leading to the lungs. Most people improve within 1 week, but symptoms can last up to 2 weeks. A residual cough may last even longer.  CAUSES Many different viruses can infect the tissues lining the upper respiratory tract. The tissues become irritated and inflamed and often become very moist. Mucus production is also common. A cold is contagious. You can easily spread the virus to others by oral contact. This includes kissing, sharing a glass, coughing, or sneezing. Touching your mouth or nose and then touching a surface, which is then touched by another person, can also spread the virus. SYMPTOMS  Symptoms typically develop 1 to 3 days after you come in contact with a cold virus. Symptoms vary from person to person. They may include:  Runny nose.  Sneezing.  Nasal congestion.  Sinus irritation.  Sore throat.  Loss of voice (laryngitis).  Cough.  Fatigue.  Muscle aches.  Loss of appetite.  Headache.  Low-grade fever. DIAGNOSIS  You might diagnose your own cold based on familiar symptoms, since most people get a cold 2 to 3 times a year. Your caregiver can confirm this based on your exam. Most importantly, your caregiver can check that your symptoms are not due to another disease such as strep throat, sinusitis, pneumonia, asthma, or epiglottitis. Blood tests, throat tests, and X-rays are not necessary to diagnose a common cold, but they may sometimes be helpful in excluding other more serious diseases. Your caregiver will decide if any further tests are required. RISKS AND COMPLICATIONS  You may be at risk for a more severe case of the common cold if you smoke cigarettes, have chronic heart disease (such as heart failure) or lung disease (such as asthma), or if  you have a weakened immune system. The very young and very old are also at risk for more serious infections. Bacterial sinusitis, middle ear infections, and bacterial pneumonia can complicate the common cold. The common cold can worsen asthma and chronic obstructive pulmonary disease (COPD). Sometimes, these complications can require emergency medical care and may be life-threatening. PREVENTION  The best way to protect against getting a cold is to practice good hygiene. Avoid oral or hand contact with people with cold symptoms. Wash your hands often if contact occurs. There is no clear evidence that vitamin C, vitamin E, echinacea, or exercise reduces the chance of developing a cold. However, it is always recommended to get plenty of rest and practice good nutrition. TREATMENT  Treatment is directed at relieving symptoms. There is no cure. Antibiotics are not effective, because the infection is caused by a virus, not by bacteria. Treatment may include:  Increased fluid intake. Sports drinks offer valuable electrolytes, sugars, and fluids.  Breathing heated mist or steam (vaporizer or shower).  Eating chicken soup or other clear broths, and maintaining good nutrition.  Getting plenty of rest.  Using gargles or lozenges for comfort.  Controlling fevers with ibuprofen or acetaminophen as directed by your caregiver.  Increasing usage of your inhaler if you have asthma. Zinc gel and zinc lozenges, taken in the first 24 hours of the common cold, can shorten the duration and lessen the severity of symptoms. Pain medicines may help with fever, muscle aches, and throat pain. A variety of non-prescription medicines are available to treat congestion and runny nose. Your caregiver   can make recommendations and may suggest nasal or lung inhalers for other symptoms.  HOME CARE INSTRUCTIONS   Only take over-the-counter or prescription medicines for pain, discomfort, or fever as directed by your  caregiver.  Use a warm mist humidifier or inhale steam from a shower to increase air moisture. This may keep secretions moist and make it easier to breathe.  Drink enough water and fluids to keep your urine clear or pale yellow.  Rest as needed.  Return to work when your temperature has returned to normal or as your caregiver advises. You may need to stay home longer to avoid infecting others. You can also use a face mask and careful hand washing to prevent spread of the virus. SEEK MEDICAL CARE IF:   After the first few days, you feel you are getting worse rather than better.  You need your caregiver's advice about medicines to control symptoms.  You develop chills, worsening shortness of breath, or brown or red sputum. These may be signs of pneumonia.  You develop yellow or brown nasal discharge or pain in the face, especially when you bend forward. These may be signs of sinusitis.  You develop a fever, swollen neck glands, pain with swallowing, or white areas in the back of your throat. These may be signs of strep throat. SEEK IMMEDIATE MEDICAL CARE IF:   You have a fever.  You develop severe or persistent headache, ear pain, sinus pain, or chest pain.  You develop wheezing, a prolonged cough, cough up blood, or have a change in your usual mucus (if you have chronic lung disease).  You develop sore muscles or a stiff neck. Document Released: 04/17/2001 Document Revised: 01/14/2012 Document Reviewed: 01/27/2014 [facility] Patient Information 2015 [place], [place]. This information is not intended to replace advice given to you by your health care provider. Make sure you discuss any questions you have with your health care provider.  Heart Failure Heart failure is a condition in which the heart has trouble pumping blood. This means your heart does not pump blood efficiently for your body to work well. In some cases of heart failure, fluid may back up into your lungs or you may have  swelling (edema) in your lower legs. Heart failure is usually a long-term (chronic) condition. It is important for you to take good care of yourself and follow your health care provider's treatment plan. CAUSES  Some health conditions can cause heart failure. Those health conditions include:  High blood pressure (hypertension). Hypertension causes the heart muscle to work harder than normal. When pressure in the blood vessels is high, the heart needs to pump (contract) with more force in order to circulate blood throughout the body. High blood pressure eventually causes the heart to become stiff and weak.  Coronary artery disease (CAD). CAD is the buildup of cholesterol and fat (plaque) in the arteries of the heart. The blockage in the arteries deprives the heart muscle of oxygen and blood. This can cause chest pain and may lead to a heart attack. High blood pressure can also contribute to CAD.  Heart attack (myocardial infarction). A heart attack occurs when one or more arteries in the heart become blocked. The loss of oxygen damages the muscle tissue of the heart. When this happens, part of the heart muscle dies. The injured tissue does not contract as well and weakens the heart's ability to pump blood.  Abnormal heart valves. When the heart valves do not open and close properly, it can cause  heart failure. This makes the heart muscle pump harder to keep the blood flowing.  Heart muscle disease (cardiomyopathy or myocarditis). Heart muscle disease is damage to the heart muscle from a variety of causes. These can include drug or alcohol abuse, infections, or unknown reasons. These can increase the risk of heart failure.  Lung disease. Lung disease makes the heart work harder because the lungs do not work properly. This can cause a strain on the heart, leading it to fail.  Diabetes. Diabetes increases the risk of heart failure. High blood sugar contributes to high fat (lipid) levels in the blood.  Diabetes can also cause slow damage to tiny blood vessels that carry important nutrients to the heart muscle. When the heart does not get enough oxygen and food, it can cause the heart to become weak and stiff. This leads to a heart that does not contract efficiently.  Other conditions can contribute to heart failure. These include abnormal heart rhythms, thyroid problems, and low blood counts (anemia). Certain unhealthy behaviors can increase the risk of heart failure, including:  Being overweight.  Smoking or chewing tobacco.  Eating foods high in fat and cholesterol.  Abusing illicit drugs or alcohol.  Lacking physical activity. SYMPTOMS  Heart failure symptoms may vary and can be hard to detect. Symptoms may include:  Shortness of breath with activity, such as climbing stairs.  Persistent cough.  Swelling of the feet, ankles, legs, or abdomen.  Unexplained weight gain.  Difficulty breathing when lying flat (orthopnea).  Waking from sleep because of the need to sit up and get more air.  Rapid heartbeat.  Fatigue and loss of energy.  Feeling light-headed, dizzy, or close to fainting.  Loss of appetite.  Nausea.  Increased urination during the night (nocturia). DIAGNOSIS  A diagnosis of heart failure is based on your history, symptoms, physical examination, and diagnostic tests. Diagnostic tests for heart failure may include:  Echocardiography.  Electrocardiography.  Chest X-ray.  Blood tests.  Exercise stress test.  Cardiac angiography.  Radionuclide scans. TREATMENT  Treatment is aimed at managing the symptoms of heart failure. Medicines, behavioral changes, or surgical intervention may be necessary to treat heart failure.  Medicines to help treat heart failure may include:  Angiotensin-converting enzyme (ACE) inhibitors. This type of medicine blocks the effects of a blood protein called angiotensin-converting enzyme. ACE inhibitors relax (dilate) the  blood vessels and help lower blood pressure.  Angiotensin receptor blockers (ARBs). This type of medicine blocks the actions of a blood protein called angiotensin. Angiotensin receptor blockers dilate the blood vessels and help lower blood pressure.  Water pills (diuretics). Diuretics cause the kidneys to remove salt and water from the blood. The extra fluid is removed through urination. This loss of extra fluid lowers the volume of blood the heart pumps.  Beta blockers. These prevent the heart from beating too fast and improve heart muscle strength.  Digitalis. This increases the force of the heartbeat.  Healthy behavior changes include:  Obtaining and maintaining a healthy weight.  Stopping smoking or chewing tobacco.  Eating heart-healthy foods.  Limiting or avoiding alcohol.  Stopping illicit drug use.  Physical activity as directed by your health care provider.  Surgical treatment for heart failure may include:  A procedure to open blocked arteries, repair damaged heart valves, or remove damaged heart muscle tissue.  A pacemaker to improve heart muscle function and control certain abnormal heart rhythms.  An internal cardioverter defibrillator to treat certain serious abnormal heart rhythms.  A left ventricular assist device (LVAD) to assist the pumping ability of the heart. HOME CARE INSTRUCTIONS   Take medicines only as directed by your health care provider. Medicines are important in reducing the workload of your heart, slowing the progression of heart failure, and improving your symptoms.  Do not stop taking your medicine unless directed by your health care provider.  Do not skip any dose of medicine.  Refill your prescriptions before you run out of medicine. Your medicines are needed every day.  Engage in moderate physical activity if directed by your health care provider. Moderate physical activity can benefit some people. The elderly and people with severe heart  failure should consult with a health care provider for physical activity recommendations.  Eat heart-healthy foods. Food choices should be free of trans fat and low in saturated fat, cholesterol, and salt (sodium). Healthy choices include fresh or frozen fruits and vegetables, fish, lean meats, legumes, fat-free or low-fat dairy products, and whole grain or high fiber foods. Talk to a dietitian to learn more about heart-healthy foods.  Limit sodium if directed by your health care provider. Sodium restriction may reduce symptoms of heart failure in some people. Talk to a dietitian to learn more about heart-healthy seasonings.  Use healthy cooking methods. Healthy cooking methods include roasting, grilling, broiling, baking, poaching, steaming, or stir-frying. Talk to a dietitian to learn more about healthy cooking methods.  Limit fluids if directed by your health care provider. Fluid restriction may reduce symptoms of heart failure in some people.  Weigh yourself every day. Daily weights are important in the early recognition of excess fluid. You should weigh yourself every morning after you urinate and before you eat breakfast. Wear the same amount of clothing each time you weigh yourself. Record your daily weight. Provide your health care provider with your weight record.  Monitor and record your blood pressure if directed by your health care provider.  Check your pulse if directed by your health care provider.  Lose weight if directed by your health care provider. Weight loss may reduce symptoms of heart failure in some people.  Stop smoking or chewing tobacco. Nicotine makes your heart work harder by causing your blood vessels to constrict. Do not use nicotine gum or patches before talking to your health care provider.  Keep all follow-up visits as directed by your health care provider. This is important.  Limit alcohol intake to no more than 1 drink per day for nonpregnant women and 2  drinks per day for men. One drink equals 12 ounces of beer, 5 ounces of wine, or 1 ounces of hard liquor. Drinking more than that is harmful to your heart. Tell your health care provider if you drink alcohol several times a week. Talk with your health care provider about whether alcohol is safe for you. If your heart has already been damaged by alcohol or you have severe heart failure, drinking alcohol should be stopped completely.  Stop illicit drug use.  Stay up-to-date with immunizations. It is especially important to prevent respiratory infections through current pneumococcal and influenza immunizations.  Manage other health conditions such as hypertension, diabetes, thyroid disease, or abnormal heart rhythms as directed by your health care provider.  Learn to manage stress.  Plan rest periods when fatigued.  Learn strategies to manage high temperatures. If the weather is extremely hot:  Avoid vigorous physical activity.  Use air conditioning or fans or seek a cooler location.  Avoid caffeine and alcohol.  Wear  loose-fitting, lightweight, and light-colored clothing.  Learn strategies to manage cold temperatures. If the weather is extremely cold:  Avoid vigorous physical activity.  Layer clothes.  Wear mittens or gloves, a hat, and a scarf when going outside.  Avoid alcohol.  Obtain ongoing education and support as needed.  Participate in or seek rehabilitation as needed to maintain or improve independence and quality of life. SEEK MEDICAL CARE IF:   Your weight increases by 03 lb/1.4 kg in 1 day or 05 lb/2.3 kg in a week.  You have increasing shortness of breath that is unusual for you.  You are unable to participate in your usual physical activities.  You tire easily.  You cough more than normal, especially with physical activity.  You have any or more swelling in areas such as your hands, feet, ankles, or abdomen.  You are unable to sleep because it is hard to  breathe.  You feel like your heart is beating fast (palpitations).  You become dizzy or light-headed upon standing up. SEEK IMMEDIATE MEDICAL CARE IF:   You have difficulty breathing.  There is a change in mental status such as decreased alertness or difficulty with concentration.  You have a pain or discomfort in your chest.  You have an episode of fainting (syncope). MAKE SURE YOU:   Understand these instructions.  Will watch your condition.  Will get help right away if you are not doing well or get worse. Document Released: 10/22/2005 Document Revised: 03/08/2014 Document Reviewed: 11/21/2012 [facility] Patient Information 2015 [place], [place]. This information is not intended to replace advice given to you by your health care provider. Make sure you discuss any questions you have with your health care provider.

## 2015-04-01 LAB — IRON AND TIBC
%SAT: 6 % — ABNORMAL LOW (ref 20–55)
Iron: 19 ug/dL — ABNORMAL LOW (ref 42–145)
TIBC: 305 ug/dL (ref 250–470)
UIBC: 286 ug/dL (ref 125–400)

## 2015-04-01 LAB — BASIC METABOLIC PANEL WITH GFR
BUN: 30 mg/dL — ABNORMAL HIGH (ref 6–23)
CALCIUM: 9.7 mg/dL (ref 8.4–10.5)
CO2: 28 mEq/L (ref 19–32)
Chloride: 106 mEq/L (ref 96–112)
Creat: 1.08 mg/dL (ref 0.50–1.10)
GFR, Est African American: 56 mL/min — ABNORMAL LOW
GFR, Est Non African American: 49 mL/min — ABNORMAL LOW
GLUCOSE: 84 mg/dL (ref 70–99)
Potassium: 5 mEq/L (ref 3.5–5.3)
Sodium: 145 mEq/L (ref 135–145)

## 2015-04-01 LAB — HEPATIC FUNCTION PANEL
ALBUMIN: 3.3 g/dL — AB (ref 3.5–5.2)
ALT: 12 U/L (ref 0–35)
AST: 15 U/L (ref 0–37)
Alkaline Phosphatase: 79 U/L (ref 39–117)
Bilirubin, Direct: 0.1 mg/dL (ref 0.0–0.3)
Indirect Bilirubin: 0.2 mg/dL (ref 0.2–1.2)
Total Bilirubin: 0.3 mg/dL (ref 0.2–1.2)
Total Protein: 6.8 g/dL (ref 6.0–8.3)

## 2015-04-01 LAB — HEMOGLOBIN A1C
HEMOGLOBIN A1C: 6.6 % — AB (ref <5.7)
Mean Plasma Glucose: 143 mg/dL — ABNORMAL HIGH (ref <117)

## 2015-04-01 LAB — PRO B NATRIURETIC PEPTIDE: Pro B Natriuretic peptide (BNP): 1872 pg/mL — ABNORMAL HIGH (ref <451)

## 2015-04-01 LAB — LIPID PANEL
CHOL/HDL RATIO: 2.5 ratio
Cholesterol: 150 mg/dL (ref 0–200)
HDL: 61 mg/dL (ref >=46)
LDL Cholesterol: 73 mg/dL (ref 0–99)
TRIGLYCERIDES: 81 mg/dL (ref <150)
VLDL: 16 mg/dL (ref 0–40)

## 2015-04-01 LAB — MAGNESIUM: Magnesium: 1.9 mg/dL (ref 1.5–2.5)

## 2015-04-01 LAB — VITAMIN D 25 HYDROXY (VIT D DEFICIENCY, FRACTURES): Vit D, 25-Hydroxy: 82 ng/mL (ref 30–100)

## 2015-04-01 LAB — INSULIN, RANDOM: INSULIN: 5.5 u[IU]/mL (ref 2.0–19.6)

## 2015-04-01 LAB — VITAMIN B12: Vitamin B-12: >2000 pg/mL — ABNORMAL HIGH (ref 211–911)

## 2015-04-04 ENCOUNTER — Other Ambulatory Visit: Payer: Self-pay | Admitting: Internal Medicine

## 2015-04-04 MED ORDER — METFORMIN HCL ER 500 MG PO TB24: 500.0000 mg | ORAL_TABLET | Freq: Every day | ORAL | Status: DC

## 2015-04-04 MED ORDER — FUROSEMIDE 20 MG PO TABS: 20.0000 mg | ORAL_TABLET | Freq: Every day | ORAL | Status: DC

## 2015-04-04 MED ORDER — POTASSIUM CHLORIDE CRYS ER 20 MEQ PO TBCR: 20.0000 meq | EXTENDED_RELEASE_TABLET | Freq: Every day | ORAL | Status: DC

## 2015-04-05 ENCOUNTER — Other Ambulatory Visit: Payer: Self-pay | Admitting: Internal Medicine

## 2015-04-10 ENCOUNTER — Inpatient Hospital Stay (HOSPITAL_COMMUNITY)
Admission: EM | Admit: 2015-04-10 | Discharge: 2015-04-14 | DRG: 871 | Disposition: A | Discharge status: Home Health | Payer: Medicare HMO | Attending: Family Medicine | Admitting: Family Medicine

## 2015-04-10 ENCOUNTER — Encounter (HOSPITAL_COMMUNITY): Payer: Self-pay | Admitting: *Deleted

## 2015-04-10 ENCOUNTER — Emergency Department (HOSPITAL_COMMUNITY): Payer: Medicare HMO

## 2015-04-10 DIAGNOSIS — K219 Gastro-esophageal reflux disease without esophagitis: Secondary | ICD-10-CM | POA: Diagnosis present

## 2015-04-10 DIAGNOSIS — Z86718 Personal history of other venous thrombosis and embolism: Secondary | ICD-10-CM

## 2015-04-10 DIAGNOSIS — Z9841 Cataract extraction status, right eye: Secondary | ICD-10-CM

## 2015-04-10 DIAGNOSIS — Z79899 Other long term (current) drug therapy: Secondary | ICD-10-CM

## 2015-04-10 DIAGNOSIS — E1140 Type 2 diabetes mellitus with diabetic neuropathy, unspecified: Secondary | ICD-10-CM | POA: Diagnosis present

## 2015-04-10 DIAGNOSIS — Z85038 Personal history of other malignant neoplasm of large intestine: Secondary | ICD-10-CM | POA: Diagnosis not present

## 2015-04-10 DIAGNOSIS — A419 Sepsis, unspecified organism: Secondary | ICD-10-CM | POA: Diagnosis present

## 2015-04-10 DIAGNOSIS — J189 Pneumonia, unspecified organism: Secondary | ICD-10-CM | POA: Diagnosis present

## 2015-04-10 DIAGNOSIS — R0602 Shortness of breath: Secondary | ICD-10-CM

## 2015-04-10 DIAGNOSIS — I739 Peripheral vascular disease, unspecified: Secondary | ICD-10-CM | POA: Diagnosis present

## 2015-04-10 DIAGNOSIS — K5900 Constipation, unspecified: Secondary | ICD-10-CM | POA: Diagnosis present

## 2015-04-10 DIAGNOSIS — R05 Cough: Secondary | ICD-10-CM

## 2015-04-10 DIAGNOSIS — E785 Hyperlipidemia, unspecified: Secondary | ICD-10-CM | POA: Diagnosis present

## 2015-04-10 DIAGNOSIS — Z981 Arthrodesis status: Secondary | ICD-10-CM

## 2015-04-10 DIAGNOSIS — Z87891 Personal history of nicotine dependence: Secondary | ICD-10-CM | POA: Diagnosis not present

## 2015-04-10 DIAGNOSIS — Z9842 Cataract extraction status, left eye: Secondary | ICD-10-CM | POA: Diagnosis not present

## 2015-04-10 DIAGNOSIS — M1611 Unilateral primary osteoarthritis, right hip: Secondary | ICD-10-CM | POA: Diagnosis present

## 2015-04-10 DIAGNOSIS — Z853 Personal history of malignant neoplasm of breast: Secondary | ICD-10-CM | POA: Diagnosis not present

## 2015-04-10 DIAGNOSIS — N179 Acute kidney failure, unspecified: Secondary | ICD-10-CM | POA: Diagnosis present

## 2015-04-10 DIAGNOSIS — Z7901 Long term (current) use of anticoagulants: Secondary | ICD-10-CM | POA: Diagnosis not present

## 2015-04-10 DIAGNOSIS — G8254 Quadriplegia, C5-C7 incomplete: Secondary | ICD-10-CM | POA: Diagnosis present

## 2015-04-10 DIAGNOSIS — R918 Other nonspecific abnormal finding of lung field: Secondary | ICD-10-CM | POA: Diagnosis present

## 2015-04-10 DIAGNOSIS — J449 Chronic obstructive pulmonary disease, unspecified: Secondary | ICD-10-CM | POA: Diagnosis present

## 2015-04-10 DIAGNOSIS — N183 Chronic kidney disease, stage 3 (moderate): Secondary | ICD-10-CM | POA: Diagnosis present

## 2015-04-10 DIAGNOSIS — M25559 Pain in unspecified hip: Secondary | ICD-10-CM

## 2015-04-10 DIAGNOSIS — R059 Cough, unspecified: Secondary | ICD-10-CM

## 2015-04-10 LAB — CBC
HCT: 37.6 % (ref 36.0–46.0)
HEMOGLOBIN: 12.6 g/dL (ref 12.0–15.0)
MCH: 27.6 pg (ref 26.0–34.0)
MCHC: 33.5 g/dL (ref 30.0–36.0)
MCV: 82.3 fL (ref 78.0–100.0)
Platelets: 317 10*3/uL (ref 150–400)
RBC: 4.57 MIL/uL (ref 3.87–5.11)
RDW: 13.9 % (ref 11.5–15.5)
WBC: 13.4 10*3/uL — AB (ref 4.0–10.5)

## 2015-04-10 LAB — URINALYSIS, ROUTINE W REFLEX MICROSCOPIC
Bilirubin Urine: NEGATIVE
GLUCOSE, UA: NEGATIVE mg/dL
Ketones, ur: NEGATIVE mg/dL
Leukocytes, UA: NEGATIVE
NITRITE: NEGATIVE
PH: 5 (ref 5.0–8.0)
Protein, ur: NEGATIVE mg/dL
Specific Gravity, Urine: 1.009 (ref 1.005–1.030)
Urobilinogen, UA: 0.2 mg/dL (ref 0.0–1.0)

## 2015-04-10 LAB — I-STAT CG4 LACTIC ACID, ED: Lactic Acid, Venous: 1.41 mmol/L (ref 0.5–2.0)

## 2015-04-10 LAB — COMPREHENSIVE METABOLIC PANEL
ALBUMIN: 2.7 g/dL — AB (ref 3.5–5.0)
ALT: 29 U/L (ref 14–54)
AST: 36 U/L (ref 15–41)
Alkaline Phosphatase: 53 U/L (ref 38–126)
Anion gap: 11 (ref 5–15)
BUN: 40 mg/dL — ABNORMAL HIGH (ref 6–20)
CALCIUM: 8.8 mg/dL — AB (ref 8.9–10.3)
CO2: 28 mmol/L (ref 22–32)
Chloride: 97 mmol/L — ABNORMAL LOW (ref 101–111)
Creatinine, Ser: 1.78 mg/dL — ABNORMAL HIGH (ref 0.44–1.00)
GFR calc Af Amer: 30 mL/min — ABNORMAL LOW (ref >60)
GFR, EST NON AFRICAN AMERICAN: 26 mL/min — AB (ref >60)
GLUCOSE: 103 mg/dL — AB (ref 65–99)
Potassium: 4.3 mmol/L (ref 3.5–5.1)
Sodium: 136 mmol/L (ref 135–145)
Total Bilirubin: 0.9 mg/dL (ref 0.3–1.2)
Total Protein: 6.5 g/dL (ref 6.5–8.1)

## 2015-04-10 LAB — I-STAT TROPONIN, ED: TROPONIN I, POC: 0 ng/mL (ref 0.00–0.08)

## 2015-04-10 LAB — BRAIN NATRIURETIC PEPTIDE: B Natriuretic Peptide: 110.6 pg/mL — ABNORMAL HIGH (ref 0.0–100.0)

## 2015-04-10 LAB — POC OCCULT BLOOD, ED: FECAL OCCULT BLD: NEGATIVE

## 2015-04-10 LAB — URINE MICROSCOPIC-ADD ON

## 2015-04-10 LAB — CBG MONITORING, ED: GLUCOSE-CAPILLARY: 80 mg/dL (ref 65–99)

## 2015-04-10 MED ORDER — BENZONATATE 100 MG PO CAPS
100.0000 mg | ORAL_CAPSULE | Freq: Four times a day (QID) | ORAL | Status: DC | PRN
  Administered 2015-04-10 – 2015-04-12 (×3): 100 mg via ORAL
  Filled 2015-04-10 (×5): qty 1

## 2015-04-10 MED ORDER — IPRATROPIUM-ALBUTEROL 0.5-2.5 (3) MG/3ML IN SOLN
3.0000 mL | Freq: Once | RESPIRATORY_TRACT | Status: AC
  Administered 2015-04-10: 3 mL via RESPIRATORY_TRACT
  Filled 2015-04-10: qty 3

## 2015-04-10 MED ORDER — ATENOLOL 50 MG PO TABS
50.0000 mg | ORAL_TABLET | Freq: Every day | ORAL | Status: DC
  Administered 2015-04-10 – 2015-04-14 (×5): 50 mg via ORAL
  Filled 2015-04-10 (×5): qty 1

## 2015-04-10 MED ORDER — AZITHROMYCIN 500 MG PO TABS
500.0000 mg | ORAL_TABLET | ORAL | Status: DC
  Administered 2015-04-11 – 2015-04-12 (×2): 500 mg via ORAL
  Filled 2015-04-10 (×3): qty 1

## 2015-04-10 MED ORDER — RIVAROXABAN 20 MG PO TABS: 20.0000 mg | ORAL_TABLET | Freq: Every day | ORAL | Status: DC

## 2015-04-10 MED ORDER — DEXTROSE 5 % IV SOLN
500.0000 mg | Freq: Once | INTRAVENOUS | Status: DC
  Filled 2015-04-10: qty 500

## 2015-04-10 MED ORDER — ATORVASTATIN CALCIUM 10 MG PO TABS
10.0000 mg | ORAL_TABLET | Freq: Every evening | ORAL | Status: DC
  Administered 2015-04-10 – 2015-04-13 (×4): 10 mg via ORAL
  Filled 2015-04-10 (×5): qty 1

## 2015-04-10 MED ORDER — DEXTROSE 5 % IV SOLN
1.0000 g | INTRAVENOUS | Status: DC
  Administered 2015-04-11 – 2015-04-12 (×2): 1 g via INTRAVENOUS
  Filled 2015-04-10 (×3): qty 10

## 2015-04-10 MED ORDER — SODIUM CHLORIDE 0.9 % IV BOLUS (SEPSIS): 1000.0000 mL | Freq: Once | INTRAVENOUS | Status: DC

## 2015-04-10 MED ORDER — SODIUM CHLORIDE 0.9 % IV SOLN
INTRAVENOUS | Status: DC
  Administered 2015-04-10 – 2015-04-12 (×4): via INTRAVENOUS
  Administered 2015-04-13: 1000 mL via INTRAVENOUS
  Administered 2015-04-14: 01:00:00 via INTRAVENOUS

## 2015-04-10 MED ORDER — LEVOTHYROXINE SODIUM 75 MCG PO TABS
75.0000 ug | ORAL_TABLET | Freq: Every day | ORAL | Status: DC
  Administered 2015-04-11 – 2015-04-14 (×4): 75 ug via ORAL
  Filled 2015-04-10 (×5): qty 1

## 2015-04-10 MED ORDER — APIXABAN 2.5 MG PO TABS
2.5000 mg | ORAL_TABLET | Freq: Two times a day (BID) | ORAL | Status: DC
  Administered 2015-04-10 – 2015-04-14 (×8): 2.5 mg via ORAL
  Filled 2015-04-10 (×9): qty 1

## 2015-04-10 MED ORDER — GABAPENTIN 100 MG PO CAPS
200.0000 mg | ORAL_CAPSULE | Freq: Three times a day (TID) | ORAL | Status: DC
  Administered 2015-04-10 – 2015-04-13 (×8): 200 mg via ORAL
  Filled 2015-04-10 (×10): qty 2

## 2015-04-10 MED ORDER — TRAMADOL HCL 50 MG PO TABS
50.0000 mg | ORAL_TABLET | Freq: Every day | ORAL | Status: DC
  Administered 2015-04-10 – 2015-04-13 (×4): 50 mg via ORAL
  Filled 2015-04-10 (×4): qty 1

## 2015-04-10 MED ORDER — CEFTRIAXONE SODIUM 1 G IJ SOLR
1.0000 g | Freq: Once | INTRAMUSCULAR | Status: AC
  Administered 2015-04-10: 1 g via INTRAVENOUS
  Filled 2015-04-10: qty 10

## 2015-04-10 NOTE — ED Notes (Signed)
Pt c/o sob with cough with increasing sob, R thigh pain that increases with movement, black stools and weakness.  Was being tx by a Naturalist, but last week went to see an Md.  Was prescribed lasix, antibiotics, potassium and metformin with no improvement.  Family states that pt, however, has not been taking metformin b/c her friend told her it was what was making her sick.

## 2015-04-10 NOTE — ED Notes (Signed)
Hospitalist at the bedside 

## 2015-04-10 NOTE — ED Notes (Signed)
Report attempted, RN not available.

## 2015-04-10 NOTE — Progress Notes (Signed)
Received report from [place], RN in ED for transfer of pt to [phone].

## 2015-04-10 NOTE — ED Provider Notes (Signed)
CSN: [id number]     Arrival date & time 04/10/15  1552 History   First MD Initiated Contact with Patient 04/10/15 1631     Chief Complaint  Patient presents with  . Cough  . Weakness  . Hip Pain     (Consider location/radiation/quality/duration/timing/severity/associated sxs/prior Treatment) The history is provided by the patient.  Cassandra Walker is a 81 y.o. female hx of HL, GERD, DVT off anticoagulation, presenting with persistent cough, shortness of breath, black stools. Patient has been coughing for several months. She was seen at the [place] office a week ago and finished a course of Levaquin, prednisone with minimal relief. Patient was also prescribed some Lasix with potassium for diuresis as well as metformin for elevated hemoglobin A1c. Patient has stopped taking metformin due to persistent nausea. She has a nonproductive cough. Also has some pain in the right thigh with movement but is able to walk. She had some melena 2 days ago that resolved. Denies any fevers or chills. Of note, patient has a known right lung mass but she does not want any treatment for it. She was a former smoker and has COPD.    Past Medical History  Diagnosis Date  . Osteoarthritis   . Hyperlipidemia   . GERD (gastroesophageal reflux disease)   . Peripheral neuropathy   . Osteopenia   . DDD (degenerative disc disease)   . Shingles   . Colon cancer 1977  . Thyroid cancer 1966  . Peripheral vascular disease   . Deep vein blood clot of left lower extremity 05/06/13   Past Surgical History  Procedure Laterality Date  . Thyroidectomy    . Total abdominal hysterectomy w/ bilateral salpingoophorectomy  1968  . Colon resection  1977  . Cervical laminectomy  1995  . Cataract extraction      bilateral  . Breast enhancement surgery      silicone  . Breast surgery    . Posterior cervical fusion/foraminotomy N/A 03/07/2013    Procedure: Cervical four -seven POSTERIOR CERVICAL FUSION;  Surgeon: Elaina Hoops, MD;   Location: [place] [facility];  Service: Neurosurgery;  Laterality: N/A;   Family History  Problem Relation Age of Onset  . Lung cancer Father    History  Substance Use Topics  . Smoking status: Former Smoker -- 1.00 packs/day for 20 years    Quit date: 11/05/1972  . Smokeless tobacco: Never Used  . Alcohol Use: No   OB History    No data available     Review of Systems  Respiratory: Positive for cough.   Neurological: Positive for weakness.  All other systems reviewed and are negative.     Allergies  Codeine and Oxybutynin  Home Medications   Prior to Admission medications   Medication Sig Start Date End Date Taking? Authorizing Provider  atenolol (TENORMIN) 50 MG tablet Take 1 tablet (50 mg total) by mouth daily. 01/07/15 01/07/16 Yes Unk Pinto, MD  atorvastatin (LIPITOR) 10 MG tablet TAKE 1 TABLET BY MOUTH EVERY EVENING 01/14/15  Yes Unk Pinto, MD  benzonatate (TESSALON PERLES) 100 MG capsule Take 1 capsule (100 mg total) by mouth every 6 (six) hours as needed for cough. 03/31/15 03/30/16 Yes Courtney Forcucci, PA-C  Cholecalciferol (VITAMIN D3 HIGH POTENCY PO) Take 5,000 Units by mouth daily.   Yes Historical Provider, MD  fish oil-omega-3 fatty acids 1000 MG capsule Take 1 g by mouth daily.    Yes Historical Provider, MD  furosemide (LASIX) 20 MG tablet Take  1 tablet (20 mg total) by mouth daily. 04/04/15 04/03/16 Yes Courtney Forcucci, PA-C  gabapentin (NEURONTIN) 100 MG capsule Take 200 mg by mouth 3 (three) times daily.   Yes Historical Provider, MD  levothyroxine (SYNTHROID, LEVOTHROID) 75 MCG tablet Take 1 tablet (75 mcg total) by mouth daily. 10/11/14  Yes Unk Pinto, MD  lisinopril (PRINIVIL,ZESTRIL) 20 MG tablet Take 20 mg by mouth daily.   Yes Historical Provider, MD  metFORMIN (GLUCOPHAGE XR) 500 MG 24 hr tablet Take 1 tablet (500 mg total) by mouth daily with breakfast. 04/04/15  Yes Courtney Forcucci, PA-C  potassium chloride SA (K-DUR,KLOR-CON) 20 MEQ tablet  TAKE 1 TABLET BY MOUTH EVERY DAY WITH YOUR FLUID PILL 04/05/15  Yes Vicie Mutters, PA-C  predniSONE (DELTASONE) 20 MG tablet 3 tabs po day one, then 2 tabs daily x 4 days 03/31/15  Yes Courtney Forcucci, PA-C  PRESCRIPTION MEDICATION Take 0.5 drops by mouth 3 (three) times daily. Medication:Bladder/Spinal Detox liquid. Take at least 5 minutes away from food or other remedies   Yes Historical Provider, MD  PRESCRIPTION MEDICATION Take 0.5 drops by mouth 3 (three) times daily. Medication: Large Intestine detox. Take at least 5 minutes away from food or other remedies   Yes Historical Provider, MD  PRESCRIPTION MEDICATION Take 0.5 drops by mouth 3 (three) times daily. Medication: Calendula Support. Take at least 5 minutes away from food or other remedies   Yes Historical Provider, MD  rivaroxaban (XARELTO) 20 MG TABS tablet Take 1 tablet (20 mg total) by mouth daily with supper. 11/16/14  Yes Vicie Mutters, PA-C  traMADol (ULTRAM) 50 MG tablet TAKE 1/2-1 TABLET BY MOUTH THREE TIMES DAILY AS NEEDED FOR PAIN Patient taking differently: Take 50 mg by mouth daily.  03/31/15  Yes Courtney Forcucci, PA-C  levofloxacin (LEVAQUIN) 750 MG tablet Take 1 tablet (750 mg total) by mouth daily. X 7 days Patient not taking: Reported on 04/10/2015 03/31/15   Loma Sousa Forcucci, PA-C  meloxicam (MOBIC) 15 MG tablet Take 1 tablet (15 mg total) by mouth daily. For Pain and inflammation Patient not taking: Reported on 04/10/2015 11/26/13   Unk Pinto, MD   BP 144/97 mmHg  Pulse 100  Temp(Src) 100.5 F (38.1 C) (Oral)  Resp 24  Ht '5\' 5"'$  (1.651 m)  Wt 134 lb 12.8 oz (61.145 kg)  BMI 22.43 kg/m2  SpO2 100% Physical Exam  Constitutional:  Tired   HENT:  Head: Normocephalic.  Eyes: Conjunctivae are normal. Pupils are equal, round, and reactive to light.  Neck: Normal range of motion. Neck supple.  Cardiovascular: Normal rate, regular rhythm and normal heart sounds.   Pulmonary/Chest:  Diminished throughout. Decreased  bilateral bases. No obvious wheezing   Abdominal: Soft. Bowel sounds are normal. She exhibits no distension. There is no tenderness. There is no rebound and no guarding.  Musculoskeletal: Normal range of motion. She exhibits no edema or tenderness.  Dec ROM R hip but no obvious deformity.   Neurological: She is alert.  Skin: Skin is warm and dry.  Psychiatric: She has a normal mood and affect. Her behavior is normal. Judgment and thought content normal.  Nursing note and vitals reviewed.   ED Course  Procedures (including critical care time) Labs Review Labs Reviewed  CBC - Abnormal; Notable for the following:    WBC 13.4 (*)    All other components within normal limits  COMPREHENSIVE METABOLIC PANEL - Abnormal; Notable for the following:    Chloride 97 (*)    Glucose,  Bld 103 (*)    BUN 40 (*)    Creatinine, Ser 1.78 (*)    Calcium 8.8 (*)    Albumin 2.7 (*)    GFR calc non Af Amer 26 (*)    GFR calc Af Amer 30 (*)    All other components within normal limits  BRAIN NATRIURETIC PEPTIDE - Abnormal; Notable for the following:    B Natriuretic Peptide 110.6 (*)    All other components within normal limits  URINE CULTURE  CULTURE, BLOOD (ROUTINE X 2)  CULTURE, BLOOD (ROUTINE X 2)  URINALYSIS, ROUTINE W REFLEX MICROSCOPIC (NOT AT [facility])  I-STAT TROPOININ, ED  POC OCCULT BLOOD, ED  CBG MONITORING, ED  I-STAT CG4 LACTIC ACID, ED    Imaging Review Dg Chest 2 View  04/10/2015   CLINICAL DATA:  Dry cough for 2 months, weakness, RIGHT hip pain, history of colon cancer, thyroid cancer, lung mass  EXAM: CHEST  2 VIEW  COMPARISON:  03/31/2015  FINDINGS: Capsular calcification at BILATERAL breast prostheses.  Minimal enlargement of cardiac silhouette.  Mediastinal contours and pulmonary vascularity normal.  RIGHT perihilar mass again identified 6.2 x 4.2 cm in size.  Emphysematous and bronchitic changes consistent with COPD.  Questionable RIGHT upper lobe infiltrate.  Remaining lungs clear.   No pleural effusion or pneumothorax.  Bones demineralized with degenerative disc disease changes thoracic spine and prior cervical spine fusion.  IMPRESSION: COPD changes with RIGHT lower lobe mass likely representing malignancy.  Question subtle RIGHT upper lobe infiltrate.  Minimal enlargement of cardiac silhouette.   Electronically Signed   By: Lavonia Dana M.D.   On: 04/10/2015 17:14   Dg Hip Unilat With Pelvis 2-3 Views Right  04/10/2015   CLINICAL DATA:  Forty-five year history of right hip pain  EXAM: RIGHT HIP (WITH PELVIS) 2-3 VIEWS  COMPARISON:  None.  FINDINGS: Severe right hip joint degenerative changes with joint space narrowing and osteophytic spurring. Moderate degenerative changes are noted on the left. No acute hip fracture or plain film evidence of avascular necrosis. The pubic symphysis and SI joints demonstrate degenerative changes but they are intact. No definite pelvic fractures.  IMPRESSION: Severe right hip joint degenerative changes but no acute bony abnormality.   Electronically Signed   By: Marijo Sanes M.D.   On: 04/10/2015 18:46     EKG Interpretation   Date/Time:  Sunday April 10 2015 16:21:59 EDT Ventricular Rate:  92 PR Interval:  144 QRS Duration: 86 QT Interval:  360 QTC Calculation: 445 R Axis:   -72 Text Interpretation:  Normal sinus rhythm with sinus arrhythmia Left axis  deviation Possible Anterior infarct , age undetermined Abnormal ECG No  significant change since last tracing Confirmed by Loralyn Rachel  MD, Latriece Anstine ([id number])  on 04/10/2015 4:44:33 PM      MDM   Final diagnoses:  Hip pain  Shortness of breath  Cough   Cassandra Walker is a 81 y.o. female here with cough, R hip pain. I think likely bronchitis vs pneumonia. Will get R hip xray but likely MSK. Will check labs, CXR, R hip xray.   7:25 PM CXR showed RUL pneumonia with persistent mass. Patient doesn't want treatment for the mass. Given IV abx. Also has AKI as well. Given IVF. Will admit for worsening  pneumonia, AKI.     Wandra Arthurs, MD 04/10/15 [phone]

## 2015-04-10 NOTE — H&P (Signed)
[facility] History and Physical  [name] OXB:[id number] DOB: August 07, 1932 DOA: 04/10/2015  Referring physician: EDP PCP: Alesia Richards, MD   Chief Complaint: Cough   HPI: Cassandra Walker is a 81 y.o. female h/o RLL lung mass, incomplete quadraplegia, DVT on Xarelto.  Patient presents to the ED with c/o persistent cough, SOB.  Symptoms have been ongoing for several months.  She was seen at [place] office last week and finished a course of levaquin and prednisone with minimal relief.  Patient also prescribed some lasix and potassium for diuresis as well as metformin for elevated A1C.  Cough is non-productive.  Review of Systems: Systems reviewed.  As above, otherwise negative  Past Medical History  Diagnosis Date  . Osteoarthritis   . Hyperlipidemia   . GERD (gastroesophageal reflux disease)   . Peripheral neuropathy   . Osteopenia   . DDD (degenerative disc disease)   . Shingles   . Colon cancer 1977  . Thyroid cancer 1966  . Peripheral vascular disease   . Deep vein blood clot of left lower extremity 05/06/13   Past Surgical History  Procedure Laterality Date  . Thyroidectomy    . Total abdominal hysterectomy w/ bilateral salpingoophorectomy  1968  . Colon resection  1977  . Cervical laminectomy  1995  . Cataract extraction      bilateral  . Breast enhancement surgery      silicone  . Breast surgery    . Posterior cervical fusion/foraminotomy N/A 03/07/2013    Procedure: Cervical four -seven POSTERIOR CERVICAL FUSION;  Surgeon: Elaina Hoops, MD;  Location: [place] [facility];  Service: Neurosurgery;  Laterality: N/A;   Social History:  reports that she quit smoking about 42 years ago. She has never used smokeless tobacco. She reports that she does not drink alcohol or use illicit drugs.  Allergies  Allergen Reactions  . Codeine Other (See Comments)    Jittery, nervous & hallucinations  . Oxybutynin     Tired    Family History  Problem Relation Age of Onset   . Lung cancer Father      Prior to Admission medications   Medication Sig Start Date End Date Taking? Authorizing Provider  atenolol (TENORMIN) 50 MG tablet Take 1 tablet (50 mg total) by mouth daily. 01/07/15 01/07/16 Yes Unk Pinto, MD  atorvastatin (LIPITOR) 10 MG tablet TAKE 1 TABLET BY MOUTH EVERY EVENING 01/14/15  Yes Unk Pinto, MD  benzonatate (TESSALON PERLES) 100 MG capsule Take 1 capsule (100 mg total) by mouth every 6 (six) hours as needed for cough. 03/31/15 03/30/16 Yes Courtney Forcucci, PA-C  Cholecalciferol (VITAMIN D3 HIGH POTENCY PO) Take 5,000 Units by mouth daily.   Yes Historical Provider, MD  fish oil-omega-3 fatty acids 1000 MG capsule Take 1 g by mouth daily.    Yes Historical Provider, MD  furosemide (LASIX) 20 MG tablet Take 1 tablet (20 mg total) by mouth daily. 04/04/15 04/03/16 Yes Courtney Forcucci, PA-C  gabapentin (NEURONTIN) 100 MG capsule Take 200 mg by mouth 3 (three) times daily.   Yes Historical Provider, MD  levothyroxine (SYNTHROID, LEVOTHROID) 75 MCG tablet Take 1 tablet (75 mcg total) by mouth daily. 10/11/14  Yes Unk Pinto, MD  lisinopril (PRINIVIL,ZESTRIL) 20 MG tablet Take 20 mg by mouth daily.   Yes Historical Provider, MD  metFORMIN (GLUCOPHAGE XR) 500 MG 24 hr tablet Take 1 tablet (500 mg total) by mouth daily with breakfast. 04/04/15  Yes Courtney Forcucci, PA-C  potassium chloride SA (  K-DUR,KLOR-CON) 20 MEQ tablet TAKE 1 TABLET BY MOUTH EVERY DAY WITH YOUR FLUID PILL 04/05/15  Yes Vicie Mutters, PA-C  predniSONE (DELTASONE) 20 MG tablet 3 tabs po day one, then 2 tabs daily x 4 days 03/31/15  Yes Courtney Forcucci, PA-C  PRESCRIPTION MEDICATION Take 0.5 drops by mouth 3 (three) times daily. Medication:Bladder/Spinal Detox liquid. Take at least 5 minutes away from food or other remedies   Yes Historical Provider, MD  PRESCRIPTION MEDICATION Take 0.5 drops by mouth 3 (three) times daily. Medication: Large Intestine detox. Take at least 5 minutes  away from food or other remedies   Yes Historical Provider, MD  PRESCRIPTION MEDICATION Take 0.5 drops by mouth 3 (three) times daily. Medication: Calendula Support. Take at least 5 minutes away from food or other remedies   Yes Historical Provider, MD  rivaroxaban (XARELTO) 20 MG TABS tablet Take 1 tablet (20 mg total) by mouth daily with supper. 11/16/14  Yes Vicie Mutters, PA-C  traMADol (ULTRAM) 50 MG tablet TAKE 1/2-1 TABLET BY MOUTH THREE TIMES DAILY AS NEEDED FOR PAIN Patient taking differently: Take 50 mg by mouth daily.  03/31/15  Yes Starlyn Skeans, PA-C   Physical Exam: Filed Vitals:   04/10/15 1917  BP: 144/97  Pulse: 100  Temp: 100.5 F (38.1 C)  Resp: 24    BP 144/97 mmHg  Pulse 100  Temp(Src) 100.5 F (38.1 C) (Oral)  Resp 24  Ht '5\' 5"'$  (1.651 m)  Wt 61.145 kg (134 lb 12.8 oz)  BMI 22.43 kg/m2  SpO2 100%  General Appearance:    Alert, oriented, no distress, appears stated age  Head:    Normocephalic, atraumatic  Eyes:    PERRL, EOMI, sclera non-icteric        Nose:   Nares without drainage or epistaxis. Mucosa, turbinates normal  Throat:   Moist mucous membranes. Oropharynx without erythema or exudate.  Neck:   Supple. No carotid bruits.  No thyromegaly.  No lymphadenopathy.   Back:     No CVA tenderness, no spinal tenderness  Lungs:     Diminished R base  Chest wall:    No tenderness to palpitation  Heart:    Regular rate and rhythm without murmurs, gallops, rubs  Abdomen:     Soft, non-tender, nondistended, normal bowel sounds, no organomegaly  Genitalia:    deferred  Rectal:    deferred  Extremities:   No clubbing, cyanosis or edema.  Pulses:   2+ and symmetric all extremities  Skin:   Skin color, texture, turgor normal, no rashes or lesions  Lymph nodes:   Cervical, supraclavicular, and axillary nodes normal  Neurologic:   CNII-XII intact. Normal strength, sensation and reflexes      throughout    Labs on Admission:  Basic Metabolic  Panel:  Recent Labs Lab 04/10/15 1717  NA 136  K 4.3  CL 97*  CO2 28  GLUCOSE 103*  BUN 40*  CREATININE 1.78*  CALCIUM 8.8*   Liver Function Tests:  Recent Labs Lab 04/10/15 1717  AST 36  ALT 29  ALKPHOS 53  BILITOT 0.9  PROT 6.5  ALBUMIN 2.7*   No results for input(s): LIPASE, AMYLASE in the last 168 hours. No results for input(s): AMMONIA in the last 168 hours. CBC:  Recent Labs Lab 04/10/15 1717  WBC 13.4*  HGB 12.6  HCT 37.6  MCV 82.3  PLT 317   Cardiac Enzymes: No results for input(s): CKTOTAL, CKMB, CKMBINDEX, TROPONINI in the last 168  hours.  BNP (last 3 results)  Recent Labs  03/31/15 1534  PROBNP 1872.00*   CBG:  Recent Labs Lab 04/10/15 1803  GLUCAP 80    Radiological Exams on Admission: Dg Chest 2 View  04/10/2015   CLINICAL DATA:  Dry cough for 2 months, weakness, RIGHT hip pain, history of colon cancer, thyroid cancer, lung mass  EXAM: CHEST  2 VIEW  COMPARISON:  03/31/2015  FINDINGS: Capsular calcification at BILATERAL breast prostheses.  Minimal enlargement of cardiac silhouette.  Mediastinal contours and pulmonary vascularity normal.  RIGHT perihilar mass again identified 6.2 x 4.2 cm in size.  Emphysematous and bronchitic changes consistent with COPD.  Questionable RIGHT upper lobe infiltrate.  Remaining lungs clear.  No pleural effusion or pneumothorax.  Bones demineralized with degenerative disc disease changes thoracic spine and prior cervical spine fusion.  IMPRESSION: COPD changes with RIGHT lower lobe mass likely representing malignancy.  Question subtle RIGHT upper lobe infiltrate.  Minimal enlargement of cardiac silhouette.   Electronically Signed   By: Lavonia Dana M.D.   On: 04/10/2015 17:14   Dg Hip Unilat With Pelvis 2-3 Views Right  04/10/2015   CLINICAL DATA:  Forty-five year history of right hip pain  EXAM: RIGHT HIP (WITH PELVIS) 2-3 VIEWS  COMPARISON:  None.  FINDINGS: Severe right hip joint degenerative changes with joint  space narrowing and osteophytic spurring. Moderate degenerative changes are noted on the left. No acute hip fracture or plain film evidence of avascular necrosis. The pubic symphysis and SI joints demonstrate degenerative changes but they are intact. No definite pelvic fractures.  IMPRESSION: Severe right hip joint degenerative changes but no acute bony abnormality.   Electronically Signed   By: Marijo Sanes M.D.   On: 04/10/2015 18:46    EKG: Independently reviewed.  Assessment/Plan Active Problems:   Lung mass   Incomplete quadriplegia at C5-6 level   CAP (community acquired pneumonia)   AKI (acute kidney injury)   1. CAP - given the new RUL infiltrate on X ray as well as Tm 100.5 will go ahead and treat for CAP 1. PNA pathway 2. Rocephin and azithromycin 2. Lung mass - I am far more concerned however that the majority of her pulmonary symptoms, and resultant failure of outpatient therapy for the past week, may be related to this lung mass which almost certianly represents lung cancer 1. Patient states clearly she "dosent want anything done" for the lung cancer at this time. 2. She has been told risks by numerous providers (see documentation in history). 3. I emphasized that the large lung mass may be causing her more of her problems than the CAP. 3. AKI - 1. Hold lasix and potassium 2. Gentle hydration 3. Hold lisinopril 4. DM2 - hold metformin due to AKI 5. Incomplete quadraplegia - chronic and baseline.    Code Status: Full Code  Family Communication: Family at bedside Disposition Plan: Admit to inpatient   Time spent: 70 min  Onisha Cedeno M. [facility] Pager [phone]  If 7AM-7PM, please contact the day team taking care of the patient [email or web address] Password [facility] 04/10/2015, 7:31 PM

## 2015-04-11 LAB — URINE CULTURE
Colony Count: NO GROWTH
Culture: NO GROWTH

## 2015-04-11 LAB — STREP PNEUMONIAE URINARY ANTIGEN: Strep Pneumo Urinary Antigen: NEGATIVE

## 2015-04-11 LAB — HIV ANTIBODY (ROUTINE TESTING W REFLEX): HIV Screen 4th Generation wRfx: NONREACTIVE

## 2015-04-11 MED ORDER — PHENOL 1.4 % MT LIQD
1.0000 | OROMUCOSAL | Status: DC | PRN
  Filled 2015-04-11: qty 177

## 2015-04-11 MED ORDER — HYDROCOD POLST-CPM POLST ER 10-8 MG/5ML PO SUER
5.0000 mL | Freq: Two times a day (BID) | ORAL | Status: DC | PRN
  Administered 2015-04-11: 5 mL via ORAL
  Filled 2015-04-11: qty 5

## 2015-04-11 MED ORDER — MENTHOL 3 MG MT LOZG
1.0000 | LOZENGE | OROMUCOSAL | Status: DC | PRN
  Filled 2015-04-11: qty 9

## 2015-04-11 NOTE — Discharge Instructions (Signed)
Information on my medicine - ELIQUIS (apixaban)  This medication education was reviewed with me or my healthcare representative as part of my discharge preparation.    Why was Eliquis prescribed for you? Eliquis was prescribed to treat blood clots that may have been found in the veins of your legs (deep vein thrombosis) or in your lungs (pulmonary embolism) and to reduce the risk of them occurring again.  What do You need to know about Eliquis ? The dose is one 2.5 mg tablet taken TWICE daily.  Eliquis may be taken with or without food.   Try to take the dose about the same time in the morning and in the evening. If you have difficulty swallowing the tablet whole please discuss with your pharmacist how to take the medication safely.  Take Eliquis exactly as prescribed and DO NOT stop taking Eliquis without talking to the doctor who prescribed the medication.  Stopping may increase your risk of developing a new blood clot.  Refill your prescription before you run out.  After discharge, you should have regular check-up appointments with your healthcare provider that is prescribing your Eliquis.    What do you do if you miss a dose? If a dose of ELIQUIS is not taken at the scheduled time, take it as soon as possible on the same day and twice-daily administration should be resumed. The dose should not be doubled to make up for a missed dose.  Important Safety Information A possible side effect of Eliquis is bleeding. You should call your healthcare provider right away if you experience any of the following: ? Bleeding from an injury or your nose that does not stop. ? Unusual colored urine (red or dark brown) or unusual colored stools (red or black). ? Unusual bruising for unknown reasons. ? A serious fall or if you hit your head (even if there is no bleeding).  Some medicines may interact with Eliquis and might increase your risk of bleeding or clotting while on Eliquis. To help  avoid this, consult your healthcare provider or pharmacist prior to using any new prescription or non-prescription medications, including herbals, vitamins, non-steroidal anti-inflammatory drugs (NSAIDs) and supplements.  This website has more information on Eliquis (apixaban): http://www.eliquis.com/eliquis/home

## 2015-04-11 NOTE — Progress Notes (Signed)
[facility]                                                                              Patient Demographics  Cassandra Walker, is a 81 y.o. female, DOB - 05/18/1932, [id number]  Admit date - 04/10/2015   Admitting Physician Etta Quill, DO  Outpatient Primary MD for the patient is Alesia Richards, MD  LOS - 1   Chief Complaint  Patient presents with  . Cough  . Weakness  . Hip Pain      HPI on 04/10/2015 by Dr. Jennette Kettle Cassandra Walker is a 81 y.o. female h/o RLL lung mass, incomplete quadraplegia, DVT on Xarelto. Patient presents to the ED with c/o persistent cough, SOB. Symptoms have been ongoing for several months. She was seen at [place] office last week and finished a course of levaquin and prednisone with minimal relief. Patient also prescribed some lasix and potassium for diuresis as well as metformin for elevated A1C. Cough is non-productive.  Assessment & Plan   Sepsis secondary to Community acquired pneumonia -Upon admission, patient did have fever of 100.59F, Proleukin cytosis of tachycardia -Vital signs appear to be stable -Chest x-ray: COPD changes with right lower lobe mass likely representing malignancy, question subtle right upper lobe infiltrate -Continue azithromycin and ceftriaxone -Patient has had cough for approximately 1 month or more, will add on Tussionex -Blood cultures pending -Strep pneumonia urine antigen negative -Pending legionella urine antigen  Lung mass -Seen on chest x-ray -Patient states that his bed there for a while and does not wish to have any intervention -Possibly complicating pneumonia  Acute on Chronic kidney disease, stage III  -Creatinine 1.78 -Baseline creatinine 1.3 -Will continue to monitor BMP  Diabetes mellitus, type II -Hold metformin -Will place patient with CVG monitoring and insulin sliding scale  Incomplete quadriplegia -Chronic, at baseline -PT consulted  History of left lower extremity  DVT -Continue Eliquis  Code Status: Full  Family Communication: Family at bedside  Disposition Plan: Admitted. Continue to treat pneumonia.    Time Spent in minutes   30 minutes  Procedures  None  Consults   None  DVT Prophylaxis  Elqius  Lab Results  Component Value Date   PLT 317 04/10/2015    Medications  Scheduled Meds: . apixaban  2.5 mg Oral BID  . atenolol  50 mg Oral Daily  . atorvastatin  10 mg Oral QPM  . azithromycin (ZITHROMAX) 500 MG IVPB  500 mg Intravenous Once  . azithromycin  500 mg Oral Q24H  . cefTRIAXone (ROCEPHIN)  IV  1 g Intravenous Q24H  . gabapentin  200 mg Oral TID  . levothyroxine  75 mcg Oral QAC breakfast  . sodium chloride  1,000 mL Intravenous Once  . traMADol  50 mg Oral Daily   Continuous Infusions: . sodium chloride 100 mL/hr at 04/11/15 0005   PRN Meds:.benzonatate, chlorpheniramine-HYDROcodone  Antibiotics    Anti-infectives    Start     Dose/Rate Route Frequency Ordered Stop   04/11/15 1900  cefTRIAXone (ROCEPHIN) 1 g in dextrose 5 % 50 mL IVPB     1 g 100 mL/hr over 30 Minutes Intravenous Every 24 hours  04/10/15 1930 04/18/15 1859   04/11/15 1900  azithromycin (ZITHROMAX) tablet 500 mg     500 mg Oral Every 24 hours 04/10/15 1930 04/18/15 1859   04/10/15 1730  cefTRIAXone (ROCEPHIN) 1 g in dextrose 5 % 50 mL IVPB     1 g 100 mL/hr over 30 Minutes Intravenous  Once 04/10/15 1727 04/10/15 1950   04/10/15 1730  azithromycin (ZITHROMAX) 500 mg in dextrose 5 % 250 mL IVPB     500 mg 250 mL/hr over 60 Minutes Intravenous  Once 04/10/15 1727          Subjective:   Cassandra Walker seen and examined today.  Patient continues to complain of cough. She states she is unable to cough anything up. She states she feels that she has something stuck in her throat. This is been ongoing for more than a month. Patient knows about her lung mass but does not want anything done about it. She denies any chest pain, abdominal pain, nausea  diarrhea constipation, dizziness or headache.   Objective:   Filed Vitals:   04/10/15 2001 04/10/15 2041 04/10/15 2153 04/11/15 0557  BP:   124/48 145/59  Pulse:   82 83  Temp: 98.8 F (37.1 C)  99.7 F (37.6 C) 98.9 F (37.2 C)  TempSrc: Oral  Oral Oral  Resp:   18 18  Height:  '5\' 5"'$  (1.651 m)    Weight:  62 kg (136 lb 11 oz)    SpO2:   94% 95%    Wt Readings from Last 3 Encounters:  04/10/15 62 kg (136 lb 11 oz)  03/31/15 63.05 kg (139 lb)  11/16/14 59.421 kg (131 lb)     Intake/Output Summary (Last 24 hours) at 04/11/15 1317 Last data filed at 04/11/15 0929  Gross per 24 hour  Intake 1436.67 ml  Output    325 ml  Net 1111.67 ml    Exam  General: Well developed, well nourished, NAD, appears stated age  8: NCAT, mucous membranes moist.   Cardiovascular: S1 S2 auscultated, RRR, no murmurs  Respiratory: diminished breath sounds in R base  Abdomen: Soft, nontender, nondistended, + bowel sounds  Extremities: warm dry without cyanosis clubbing or edema  Neuro: AAOx3, nonfocal  Skin: Without rashes exudates or nodules  Psych: Normal affect and demeanor with intact judgement and insight  Data Review   Micro Results No results found for this or any previous visit (from the past 240 hour(s)).  Radiology Reports Dg Chest 2 View  04/10/2015   CLINICAL DATA:  Dry cough for 2 months, weakness, RIGHT hip pain, history of colon cancer, thyroid cancer, lung mass  EXAM: CHEST  2 VIEW  COMPARISON:  03/31/2015  FINDINGS: Capsular calcification at BILATERAL breast prostheses.  Minimal enlargement of cardiac silhouette.  Mediastinal contours and pulmonary vascularity normal.  RIGHT perihilar mass again identified 6.2 x 4.2 cm in size.  Emphysematous and bronchitic changes consistent with COPD.  Questionable RIGHT upper lobe infiltrate.  Remaining lungs clear.  No pleural effusion or pneumothorax.  Bones demineralized with degenerative disc disease changes thoracic spine  and prior cervical spine fusion.  IMPRESSION: COPD changes with RIGHT lower lobe mass likely representing malignancy.  Question subtle RIGHT upper lobe infiltrate.  Minimal enlargement of cardiac silhouette.   Electronically Signed   By: Lavonia Dana M.D.   On: 04/10/2015 17:14   Dg Chest 2 View  03/31/2015   CLINICAL DATA:  One month of shortness of breath and productive cough ;  known right lung mass with probable intrapulmonary metastases.  EXAM: CHEST  2 VIEW  COMPARISON:  PA and lateral chest x-ray of May 27, 2014 and CT scan of the chest of the same date.  FINDINGS: The lungs remain hyperinflated. The mass in the superior segment of the right lower lobe is again demonstrated. Its margins are better visualized today. It measures approximately 4.5 cm transversely by 5.8 cm in superior to inferior dimension by approximately 5 cm AP. No other masses are demonstrated. The heart and pulmonary vascularity are normal. The trachea is midline. There is no pleural effusion. The thoracic vertebral bodies are preserved in height. There are calcified breast implants bilaterally.  IMPRESSION: COPD with persistent superior segment right lower lobe mass. There is no acute pneumonia nor evidence of CHF.   Electronically Signed   By: David  Martinique M.D.   On: 03/31/2015 16:57   Dg Hip Unilat With Pelvis 2-3 Views Right  04/10/2015   CLINICAL DATA:  Forty-five year history of right hip pain  EXAM: RIGHT HIP (WITH PELVIS) 2-3 VIEWS  COMPARISON:  None.  FINDINGS: Severe right hip joint degenerative changes with joint space narrowing and osteophytic spurring. Moderate degenerative changes are noted on the left. No acute hip fracture or plain film evidence of avascular necrosis. The pubic symphysis and SI joints demonstrate degenerative changes but they are intact. No definite pelvic fractures.  IMPRESSION: Severe right hip joint degenerative changes but no acute bony abnormality.   Electronically Signed   By: Marijo Sanes M.D.    On: 04/10/2015 18:46    CBC  Recent Labs Lab 04/10/15 1717  WBC 13.4*  HGB 12.6  HCT 37.6  PLT 317  MCV 82.3  MCH 27.6  MCHC 33.5  RDW 13.9    Chemistries   Recent Labs Lab 04/10/15 1717  NA 136  K 4.3  CL 97*  CO2 28  GLUCOSE 103*  BUN 40*  CREATININE 1.78*  CALCIUM 8.8*  AST 36  ALT 29  ALKPHOS 53  BILITOT 0.9   ------------------------------------------------------------------------------------------------------------------ estimated creatinine clearance is 22.7 mL/min (by C-G formula based on Cr of 1.78). ------------------------------------------------------------------------------------------------------------------ No results for input(s): HGBA1C in the last 72 hours. ------------------------------------------------------------------------------------------------------------------ No results for input(s): CHOL, HDL, LDLCALC, TRIG, CHOLHDL, LDLDIRECT in the last 72 hours. ------------------------------------------------------------------------------------------------------------------ No results for input(s): TSH, T4TOTAL, T3FREE, THYROIDAB in the last 72 hours.  Invalid input(s): FREET3 ------------------------------------------------------------------------------------------------------------------ No results for input(s): VITAMINB12, FOLATE, FERRITIN, TIBC, IRON, RETICCTPCT in the last 72 hours.  Coagulation profile No results for input(s): INR, PROTIME in the last 168 hours.  No results for input(s): DDIMER in the last 72 hours.  Cardiac Enzymes No results for input(s): CKMB, TROPONINI, MYOGLOBIN in the last 168 hours.  Invalid input(s): CK ------------------------------------------------------------------------------------------------------------------ Invalid input(s): POCBNP    Kaydyn Sayas D.O. on 04/11/2015 at 1:17 PM  Between 7am to 7pm - Pager - [phone]  After 7pm go to [email or web address] - password [email or web address]  And look for the night  coverage person covering for me after hours  [facility] Office  [phone]

## 2015-04-11 NOTE — Care Management Note (Signed)
Case Management Note  Patient Details  Name: Cassandra Walker MRN: [id number] Date of Birth: Nov 14, 1931  Subjective/Objective:     Patient lives at home alone, await pt eval, NCM will cont to follow for dc needs.                Action/Plan:   Expected Discharge Date:                  Expected Discharge Plan:  [place]  In-House Referral:     Discharge planning Services  CM Consult  Post Acute Care Choice:    Choice offered to:     DME Arranged:    DME Agency:     HH Arranged:    [place] Agency:     Status of Service:  In process, will continue to follow  Medicare Important Message Given:  Yes Date Medicare IM Given:  04/11/15 Medicare IM give by:  Tomi Bamberger RN Date Additional Medicare IM Given:    Additional Medicare Important Message give by:     If discussed at [place] of Stay Meetings, dates discussed:    Additional Comments:  Zenon Mayo, RN 04/11/2015, 11:24 AM

## 2015-04-11 NOTE — Progress Notes (Signed)
Utilization review completed.  

## 2015-04-12 LAB — BASIC METABOLIC PANEL
ANION GAP: 8 (ref 5–15)
BUN: 14 mg/dL (ref 6–20)
CALCIUM: 8.9 mg/dL (ref 8.9–10.3)
CO2: 29 mmol/L (ref 22–32)
CREATININE: 1.07 mg/dL — AB (ref 0.44–1.00)
Chloride: 103 mmol/L (ref 101–111)
GFR calc non Af Amer: 48 mL/min — ABNORMAL LOW (ref >60)
GFR, EST AFRICAN AMERICAN: 55 mL/min — AB (ref >60)
Glucose, Bld: 96 mg/dL (ref 65–99)
Potassium: 4.3 mmol/L (ref 3.5–5.1)
SODIUM: 140 mmol/L (ref 135–145)

## 2015-04-12 LAB — CBC
HEMATOCRIT: 34.7 % — AB (ref 36.0–46.0)
Hemoglobin: 11.1 g/dL — ABNORMAL LOW (ref 12.0–15.0)
MCH: 26.8 pg (ref 26.0–34.0)
MCHC: 32 g/dL (ref 30.0–36.0)
MCV: 83.8 fL (ref 78.0–100.0)
PLATELETS: 273 10*3/uL (ref 150–400)
RBC: 4.14 MIL/uL (ref 3.87–5.11)
RDW: 14 % (ref 11.5–15.5)
WBC: 10.7 10*3/uL — ABNORMAL HIGH (ref 4.0–10.5)

## 2015-04-12 LAB — LEGIONELLA ANTIGEN, URINE

## 2015-04-12 LAB — GLUCOSE, CAPILLARY
Glucose-Capillary: 107 mg/dL — ABNORMAL HIGH (ref 65–99)
Glucose-Capillary: 108 mg/dL — ABNORMAL HIGH (ref 65–99)
Glucose-Capillary: 103 mg/dL — ABNORMAL HIGH (ref 65–99)

## 2015-04-12 MED ORDER — BENZONATATE 100 MG PO CAPS
100.0000 mg | ORAL_CAPSULE | Freq: Three times a day (TID) | ORAL | Status: DC
  Administered 2015-04-12 – 2015-04-14 (×9): 100 mg via ORAL
  Filled 2015-04-12 (×12): qty 1

## 2015-04-12 MED ORDER — INSULIN ASPART 100 UNIT/ML ~~LOC~~ SOLN: 0.0000 [IU] | Freq: Three times a day (TID) | SUBCUTANEOUS | Status: DC

## 2015-04-12 NOTE — Progress Notes (Signed)
[facility]                                                                              Patient Demographics  Cassandra Walker, is a 81 y.o. female, DOB - 1931/12/10, [id number]  Admit date - 04/10/2015   Admitting Physician Etta Quill, DO  Outpatient Primary MD for the patient is Alesia Richards, MD  LOS - 2   Chief Complaint  Patient presents with  . Cough  . Weakness  . Hip Pain      HPI on 04/10/2015 by Dr. Jennette Kettle Cassandra Walker is a 81 y.o. female h/o RLL lung mass, incomplete quadraplegia, DVT on Xarelto. Patient presents to the ED with c/o persistent cough, SOB. Symptoms have been ongoing for several months. She was seen at [place] office last week and finished a course of levaquin and prednisone with minimal relief. Patient also prescribed some lasix and potassium for diuresis as well as metformin for elevated A1C. Cough is non-productive.  Assessment & Plan   Sepsis secondary to Community acquired pneumonia -Upon admission, patient did have fever of 100.42F, Proleukin cytosis of tachycardia -Vital signs appear to be stable -Chest x-ray: COPD changes with right lower lobe mass likely representing malignancy, question subtle right upper lobe infiltrate -Continue azithromycin and ceftriaxone -Patient has had cough for approximately 1 month or more -Continue Tussionex and Tessalon Perles -Blood cultures show no growth to date -Strep pneumonia and legionella urine antigens negative  Chronic cough -Wonder if related to lisinopril, however, that has been held during admission. -Continue Tussionex PRN and Scheduled Tessalon Perles  Lung mass -Seen on chest x-ray -Patient states that his bed there for a while and does not wish to have any intervention -Possibly complicating pneumonia  Acute on Chronic kidney disease, stage III  -Creatinine 1.07 -Baseline creatinine 1.3 -Will continue to monitor BMP  Diabetes mellitus, type II -Metformin  held -Continue insulin sliding scale a CBG monitoring  Incomplete quadriplegia -Chronic, at baseline -PT consulted and pending  History of left lower extremity DVT -Continue Eliquis  Code Status: Full  Family Communication: Family at bedside  Disposition Plan: Admitted. Expect discharge in the next 24-48 hours, what's cough has improved.  Time Spent in minutes   30 minutes  Procedures  None  Consults   None  DVT Prophylaxis  Elqius  Lab Results  Component Value Date   PLT 273 04/12/2015    Medications  Scheduled Meds: . apixaban  2.5 mg Oral BID  . atenolol  50 mg Oral Daily  . atorvastatin  10 mg Oral QPM  . azithromycin (ZITHROMAX) 500 MG IVPB  500 mg Intravenous Once  . azithromycin  500 mg Oral Q24H  . benzonatate  100 mg Oral TID PC & HS  . cefTRIAXone (ROCEPHIN)  IV  1 g Intravenous Q24H  . gabapentin  200 mg Oral TID  . levothyroxine  75 mcg Oral QAC breakfast  . sodium chloride  1,000 mL Intravenous Once  . traMADol  50 mg Oral Daily   Continuous Infusions: . sodium chloride 100 mL/hr at 04/11/15 2328   PRN Meds:.chlorpheniramine-HYDROcodone, menthol-cetylpyridinium, phenol  Antibiotics    Anti-infectives  Start     Dose/Rate Route Frequency Ordered Stop   04/11/15 1900  cefTRIAXone (ROCEPHIN) 1 g in dextrose 5 % 50 mL IVPB     1 g 100 mL/hr over 30 Minutes Intravenous Every 24 hours 04/10/15 1930 04/18/15 1859   04/11/15 1900  azithromycin (ZITHROMAX) tablet 500 mg     500 mg Oral Every 24 hours 04/10/15 1930 04/18/15 1859   04/10/15 1730  cefTRIAXone (ROCEPHIN) 1 g in dextrose 5 % 50 mL IVPB     1 g 100 mL/hr over 30 Minutes Intravenous  Once 04/10/15 1727 04/10/15 1950   04/10/15 1730  azithromycin (ZITHROMAX) 500 mg in dextrose 5 % 250 mL IVPB     500 mg 250 mL/hr over 60 Minutes Intravenous  Once 04/10/15 1727          Subjective:   Cassandra Walker seen and examined today.  Patient continues to complain of cough, but feels that the  Tussionex helps her. She denies having any productive cough. Does complain of some soreness in her throat from the cough.  Currently denies chest pain, abdominal pain, nausea diarrhea constipation, dizziness or headache.   Objective:   Filed Vitals:   04/11/15 1414 04/11/15 2127 04/12/15 0524 04/12/15 1035  BP: 130/56 144/56 143/57 123/56  Pulse: 64 81 62 60  Temp: 99.3 F (37.4 C) 98.8 F (37.1 C) 98.4 F (36.9 C)   TempSrc: Oral Oral Oral   Resp:  18 18   Height:      Weight:      SpO2: 93%  92%     Wt Readings from Last 3 Encounters:  04/10/15 62 kg (136 lb 11 oz)  03/31/15 63.05 kg (139 lb)  11/16/14 59.421 kg (131 lb)     Intake/Output Summary (Last 24 hours) at 04/12/15 1258 Last data filed at 04/12/15 0900  Gross per 24 hour  Intake    480 ml  Output      0 ml  Net    480 ml    Exam  General: Well developed, well nourished, no distress  HEENT: NCAT, mucous membranes moist.   Cardiovascular: S1 S2 auscultated, RRR, no murmurs  Respiratory: diminished breath sounds in R base, no wheezing.  Good air movement. Dry cough.  Abdomen: Soft, nontender, nondistended, + bowel sounds  Extremities: warm dry without cyanosis clubbing or edema  Neuro: AAOx3, nonfocal  Psych: Normal affect and demeanor, pleasant  Data Review   Micro Results Recent Results (from the past 240 hour(s))  Urine culture     Status: None   Collection Time: 04/10/15  5:47 PM  Result Value Ref Range Status   Specimen Description URINE, CATHETERIZED  Final   Special Requests NONE  Final   Colony Count NO GROWTH Performed at [facility]   Final   Culture NO GROWTH Performed at [facility]   Final   Report Status 04/11/2015 FINAL  Final  Blood culture (routine x 2)     Status: None (Preliminary result)   Collection Time: 04/10/15  6:58 PM  Result Value Ref Range Status   Specimen Description BLOOD LEFT ARM  Final   Special Requests BOTTLES DRAWN AEROBIC AND  ANAEROBIC 10CC  Final   Culture   Final           BLOOD CULTURE RECEIVED NO GROWTH TO DATE CULTURE WILL BE HELD FOR 5 DAYS BEFORE ISSUING A FINAL NEGATIVE REPORT Performed at [facility]    Report  Status PENDING  Incomplete  Blood culture (routine x 2)     Status: None (Preliminary result)   Collection Time: 04/10/15  7:05 PM  Result Value Ref Range Status   Specimen Description BLOOD LEFT HAND  Final   Special Requests BOTTLES DRAWN AEROBIC ONLY 10CC  Final   Culture   Final           BLOOD CULTURE RECEIVED NO GROWTH TO DATE CULTURE WILL BE HELD FOR 5 DAYS BEFORE ISSUING A FINAL NEGATIVE REPORT Performed at [facility]    Report Status PENDING  Incomplete    Radiology Reports Dg Chest 2 View  04/10/2015   CLINICAL DATA:  Dry cough for 2 months, weakness, RIGHT hip pain, history of colon cancer, thyroid cancer, lung mass  EXAM: CHEST  2 VIEW  COMPARISON:  03/31/2015  FINDINGS: Capsular calcification at BILATERAL breast prostheses.  Minimal enlargement of cardiac silhouette.  Mediastinal contours and pulmonary vascularity normal.  RIGHT perihilar mass again identified 6.2 x 4.2 cm in size.  Emphysematous and bronchitic changes consistent with COPD.  Questionable RIGHT upper lobe infiltrate.  Remaining lungs clear.  No pleural effusion or pneumothorax.  Bones demineralized with degenerative disc disease changes thoracic spine and prior cervical spine fusion.  IMPRESSION: COPD changes with RIGHT lower lobe mass likely representing malignancy.  Question subtle RIGHT upper lobe infiltrate.  Minimal enlargement of cardiac silhouette.   Electronically Signed   By: Lavonia Dana M.D.   On: 04/10/2015 17:14   Dg Chest 2 View  03/31/2015   CLINICAL DATA:  One month of shortness of breath and productive cough ; known right lung mass with probable intrapulmonary metastases.  EXAM: CHEST  2 VIEW  COMPARISON:  PA and lateral chest x-ray of May 27, 2014 and CT scan of the chest of the same  date.  FINDINGS: The lungs remain hyperinflated. The mass in the superior segment of the right lower lobe is again demonstrated. Its margins are better visualized today. It measures approximately 4.5 cm transversely by 5.8 cm in superior to inferior dimension by approximately 5 cm AP. No other masses are demonstrated. The heart and pulmonary vascularity are normal. The trachea is midline. There is no pleural effusion. The thoracic vertebral bodies are preserved in height. There are calcified breast implants bilaterally.  IMPRESSION: COPD with persistent superior segment right lower lobe mass. There is no acute pneumonia nor evidence of CHF.   Electronically Signed   By: David  Martinique M.D.   On: 03/31/2015 16:57   Dg Hip Unilat With Pelvis 2-3 Views Right  04/10/2015   CLINICAL DATA:  Forty-five year history of right hip pain  EXAM: RIGHT HIP (WITH PELVIS) 2-3 VIEWS  COMPARISON:  None.  FINDINGS: Severe right hip joint degenerative changes with joint space narrowing and osteophytic spurring. Moderate degenerative changes are noted on the left. No acute hip fracture or plain film evidence of avascular necrosis. The pubic symphysis and SI joints demonstrate degenerative changes but they are intact. No definite pelvic fractures.  IMPRESSION: Severe right hip joint degenerative changes but no acute bony abnormality.   Electronically Signed   By: Marijo Sanes M.D.   On: 04/10/2015 18:46    CBC  Recent Labs Lab 04/10/15 1717 04/12/15 0605  WBC 13.4* 10.7*  HGB 12.6 11.1*  HCT 37.6 34.7*  PLT 317 273  MCV 82.3 83.8  MCH 27.6 26.8  MCHC 33.5 32.0  RDW 13.9 14.0    Chemistries   Recent  Labs Lab 04/10/15 1717 04/12/15 0605  NA 136 140  K 4.3 4.3  CL 97* 103  CO2 28 29  GLUCOSE 103* 96  BUN 40* 14  CREATININE 1.78* 1.07*  CALCIUM 8.8* 8.9  AST 36  --   ALT 29  --   ALKPHOS 53  --   BILITOT 0.9  --     ------------------------------------------------------------------------------------------------------------------ estimated creatinine clearance is 37.7 mL/min (by C-G formula based on Cr of 1.07). ------------------------------------------------------------------------------------------------------------------ No results for input(s): HGBA1C in the last 72 hours. ------------------------------------------------------------------------------------------------------------------ No results for input(s): CHOL, HDL, LDLCALC, TRIG, CHOLHDL, LDLDIRECT in the last 72 hours. ------------------------------------------------------------------------------------------------------------------ No results for input(s): TSH, T4TOTAL, T3FREE, THYROIDAB in the last 72 hours.  Invalid input(s): FREET3 ------------------------------------------------------------------------------------------------------------------ No results for input(s): VITAMINB12, FOLATE, FERRITIN, TIBC, IRON, RETICCTPCT in the last 72 hours.  Coagulation profile No results for input(s): INR, PROTIME in the last 168 hours.  No results for input(s): DDIMER in the last 72 hours.  Cardiac Enzymes No results for input(s): CKMB, TROPONINI, MYOGLOBIN in the last 168 hours.  Invalid input(s): CK ------------------------------------------------------------------------------------------------------------------ Invalid input(s): POCBNP    Mary Secord D.O. on 04/12/2015 at 12:58 PM  Between 7am to 7pm - Pager - [phone]  After 7pm go to [email or web address] - password [email or web address]  And look for the night coverage person covering for me after hours  [facility] Office  [phone]

## 2015-04-12 NOTE — Evaluation (Signed)
Physical Therapy Evaluation Patient Details Name: Cassandra Walker MRN: [id number] DOB: 1932-06-13 Today's Date: 04/12/2015   History of Present Illness  Pt adm with PNA. 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 Pt needs skilled PT to maximize independence and safety to allow discharge to home with intermittent assist of family.      Follow Up Recommendations Outpatient PT (possibly)    Equipment Recommendations  None recommended by PT    Recommendations for Other Services       Precautions / Restrictions Precautions Precautions: Fall      Mobility  Bed Mobility Overal bed mobility: Needs Assistance Bed Mobility: Supine to Sit     Supine to sit: Min assist     General bed mobility comments: Assist to bring trunk up  Transfers Overall transfer level: Needs assistance Equipment used: 4-wheeled walker Transfers: Sit to/from Stand Sit to Stand: Min guard         General transfer comment: Assist for safety  Ambulation/Gait Ambulation/Gait assistance: Min guard Ambulation Distance (Feet): 170 Feet Assistive device: 4-wheeled walker Gait Pattern/deviations: Step-through pattern;Decreased stride length     General Gait Details: Pt reports rt hip is feeling okay today  Stairs            Wheelchair Mobility    Modified Rankin (Stroke Patients Only)       Balance Overall balance assessment: Needs assistance Sitting-balance support: No upper extremity supported;Feet supported Sitting balance-Leahy Scale: Good     Standing balance support: Single extremity supported Standing balance-Leahy Scale: Poor Standing balance comment: support of walker                             Pertinent Vitals/Pain Pain Assessment: No/denies pain    Home  Living Family/patient expects to be discharged to:: Private residence Living Arrangements: Alone Available Help at Discharge: Family;Available PRN/intermittently Type of Home: House Home Access: Ramped entrance     Home Layout: One level Home Equipment: Wheelchair - manual;Shower seat;Grab bars - toilet;Walker - 4 wheels      Prior Function Level of Independence: Independent with assistive device(s)         Comments: Used rollator. Able to amb without rollator at times indoors.     Hand Dominance   Dominant Hand: Right    Extremity/Trunk Assessment   Upper Extremity Assessment: RUE deficits/detail;LUE deficits/detail RUE Deficits / Details: decr fine motor and distal strength     LUE Deficits / Details: decr fine motor and distal strength   Lower Extremity Assessment: Generalized weakness         Communication   Communication: HOH  Cognition Arousal/Alertness: Awake/alert Behavior During Therapy: WFL for tasks assessed/performed Overall Cognitive Status: Within Functional Limits for tasks assessed                      General Comments      Exercises        Assessment/Plan    PT Assessment Patient needs continued PT services  PT Diagnosis Difficulty walking;Generalized weakness   PT Problem List Decreased strength;Decreased balance;Decreased mobility  PT Treatment Interventions DME instruction;Gait training;Functional mobility training;Therapeutic activities;Therapeutic exercise;Balance training;Patient/family education   PT Goals (Current goals can be found in the Care Plan section) Acute Rehab  PT Goals Patient Stated Goal: return home alone PT Goal Formulation: With patient Time For Goal Achievement: 04/19/15 Potential to Achieve Goals: Good    Frequency Min 3X/week   Barriers to discharge        Co-evaluation               End of Session Equipment Utilized During Treatment: Gait belt Activity Tolerance: Patient tolerated  treatment well Patient left: in chair;with call bell/phone within reach;with family/visitor present Nurse Communication: Mobility status         Time: 4136-4383 PT Time Calculation (min) (ACUTE ONLY): 29 min   Charges:   PT Evaluation $Initial PT Evaluation Tier I: 1 Procedure PT Treatments $Gait Training: 8-22 mins   PT G Codes:        Cassandra Walker Apr 15, 2015, 1:39 PM  [facility] PT [phone]

## 2015-04-13 ENCOUNTER — Ambulatory Visit: Payer: Self-pay | Admitting: Internal Medicine

## 2015-04-13 DIAGNOSIS — R918 Other nonspecific abnormal finding of lung field: Secondary | ICD-10-CM

## 2015-04-13 DIAGNOSIS — J189 Pneumonia, unspecified organism: Secondary | ICD-10-CM

## 2015-04-13 DIAGNOSIS — G8254 Quadriplegia, C5-C7 incomplete: Secondary | ICD-10-CM

## 2015-04-13 DIAGNOSIS — N179 Acute kidney failure, unspecified: Secondary | ICD-10-CM

## 2015-04-13 LAB — GLUCOSE, CAPILLARY
GLUCOSE-CAPILLARY: 106 mg/dL — AB (ref 65–99)
GLUCOSE-CAPILLARY: 97 mg/dL (ref 65–99)
GLUCOSE-CAPILLARY: 108 mg/dL — AB (ref 65–99)
Glucose-Capillary: 105 mg/dL — ABNORMAL HIGH (ref 65–99)

## 2015-04-13 MED ORDER — CAPSAICIN 0.075 % EX CREA
TOPICAL_CREAM | Freq: Three times a day (TID) | CUTANEOUS | Status: DC
  Administered 2015-04-13 – 2015-04-14 (×3): via TOPICAL
  Filled 2015-04-13: qty 60

## 2015-04-13 MED ORDER — MELOXICAM 15 MG PO TABS
15.0000 mg | ORAL_TABLET | Freq: Every day | ORAL | Status: DC
  Administered 2015-04-13 – 2015-04-14 (×2): 15 mg via ORAL
  Filled 2015-04-13 (×2): qty 1

## 2015-04-13 MED ORDER — LEVOFLOXACIN 500 MG PO TABS
500.0000 mg | ORAL_TABLET | Freq: Every day | ORAL | Status: DC
  Administered 2015-04-13 – 2015-04-14 (×2): 500 mg via ORAL
  Filled 2015-04-13 (×2): qty 1

## 2015-04-13 NOTE — Care Management Note (Signed)
Case Management Note  Patient Details  Name: ORRA NOLDE MRN: [id number] Date of Birth: [birth year]  Subjective/Objective:    outp pt physical therapy referral made.                Action/Plan:   Expected Discharge Date:                  Expected Discharge Plan:  [place]  In-House Referral:     Discharge planning Services  CM Consult  Post Acute Care Choice:    Choice offered to:     DME Arranged:    DME Agency:     HH Arranged:    [place]:     Status of Service:  Completed, signed off  Medicare Important Message Given:  Yes Date Medicare IM Given:  04/11/15 Medicare IM give by:  Tomi Bamberger RN Date Additional Medicare IM Given:  04/13/15 Additional Medicare Important Message give by:  Tomi Bamberger RN  If discussed at Long Length of Stay Meetings, dates discussed:    Additional Comments:  Zenon Mayo, RN 04/13/2015, 2:21 PM

## 2015-04-13 NOTE — Progress Notes (Signed)
Physical Therapy Treatment Patient Details Name: Cassandra Walker MRN: [id number] DOB: 1931-12-22 Today's Date: 05/07/2015    History of Present Illness Pt adm with PNA. 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 Family feels biggest limitation is her hip arthritis. Told them they may want to see an orthopedist after dc for treatment options for this.  Follow Up Recommendations  Outpatient PT (if pt desires)     Equipment Recommendations  None recommended by PT    Recommendations for Other Services  (Consult with orthopedist as an outpatient)     Precautions / Restrictions Precautions Precautions: Fall Restrictions Weight Bearing Restrictions: No    Mobility  Bed Mobility               General bed mobility comments: family assisted to EOB  Transfers Overall transfer level: Needs assistance Equipment used: 4-wheeled walker Transfers: Sit to/from Stand Sit to Stand: Supervision         General transfer comment: Assist for safety  Ambulation/Gait Ambulation/Gait assistance: Supervision Ambulation Distance (Feet): 170 Feet Assistive device: 4-wheeled walker Gait Pattern/deviations: Step-through pattern;Decreased step length - right;Decreased step length - left   Gait velocity interpretation: Below normal speed for age/gender General Gait Details: Pt reports rt hip is feeling okay today. No loss of balance using rollator.   Stairs            Wheelchair Mobility    Modified Rankin (Stroke Patients Only)       Balance     Sitting balance-Leahy Scale: Good       Standing balance-Leahy Scale: Poor                      Cognition Arousal/Alertness: Awake/alert Behavior During Therapy: WFL for tasks assessed/performed Overall Cognitive Status: Within Functional Limits for tasks assessed                      Exercises       General Comments        Pertinent Vitals/Pain Pain Assessment: No/denies pain    Home Living                      Prior Function            PT Goals (current goals can now be found in the care plan section) Acute Rehab PT Goals Patient Stated Goal: return home alone PT Goal Formulation: With patient Time For Goal Achievement: 04/19/15 Potential to Achieve Goals: Good    Frequency  Min 3X/week    PT Plan      Co-evaluation             End of Session Equipment Utilized During Treatment: Gait belt Activity Tolerance: Patient tolerated treatment well Patient left: in chair;with call bell/phone within reach;with family/visitor present     Time: 1202-1216 PT Time Calculation (min) (ACUTE ONLY): 14 min  Charges:  $Gait Training: 8-22 mins                    G Codes:      Trashawn Oquendo 05/07/15, 1:20 PM  [facility] PT [phone]

## 2015-04-13 NOTE — Care Management Note (Signed)
Case Management Note  Patient Details  Name: Cassandra Walker MRN: [id number] Date of Birth: Mar 12, 1932  Subjective/Objective:    Per physical therapy eval recs outpatient pt, NCM sent referral for outpt pt to neuro rehabilitation on [address] through epic.  [place] daughter states patient has had outpt pt before there.                 Action/Plan:   Expected Discharge Date:                  Expected Discharge Plan:  [place]  In-House Referral:     Discharge planning Services  CM Consult  Post Acute Care Choice:    Choice offered to:     DME Arranged:    DME Agency:     HH Arranged:    [place]:     Status of Service:  Completed, signed off  Medicare Important Message Given:  Yes Date Medicare IM Given:  04/11/15 Medicare IM give by:  Tomi Bamberger RN Date Additional Medicare IM Given:    Additional Medicare Important Message give by:     If discussed at [place] of Stay Meetings, dates discussed:    Additional Comments:  Zenon Mayo, RN 04/13/2015, 2:06 PM

## 2015-04-13 NOTE — Progress Notes (Signed)
[facility] Complaint  Patient presents with  . Cough  . Weakness  . Hip Pain      81 y.o. female known h/o RLL lung mass for the past 2 years not wishing any further workup, prior colon cancer, prior breast cancer, prior other malignancy, incomplete quadraplegia, DVT on Xarelto. Patient presents to the ED with c/o persistent cough, SOB. Symptoms have been ongoing for several months. She was seen at [facility] office last week and finished a course of levaquin and prednisone with minimal relief. Patient also prescribed some lasix and potassium for diuresis as well as metformin for elevated A1C. Cough is non-productive.  Assessment & Plan   Sepsis secondary to probable postobstructive pneumonia -Upon admission, patient did have fever of 100.1F, other signs of sepsis including tachycardiae -Chest x-ray: COPD changes with right lower lobe mass likely representing malignancy, question subtle right upper lobe infiltrate -transitioned from azithromycin and ceftriaxone-->monotherapy Levaquin 04/13/15 -Patient has had cough for approximately 1 month or more -Continue Tussionex and Tessalon Perles -Blood cultures show no growth to date -Strep pneumonia and legionella urine antigens negative -I had a long and detailed discussion with patient's health care power of attorney and granddaughter Lysbeth Galas and explained to her that patient's pneumonia is likely secondary to postobstructive causes and may recur. I think that 10-14 days of therapy with antiemetics would be reasonable however this will probably recur if we do not intervene on the lung mass which the patient does not want to do. This will need to be just moving forward with primary care physician as an outpatient. Patient may be able to be discharged home tomorrow.    Hip pain right-sided  Hip x-rays performed this admission shows severe osteoarthritis with  spurring on the right side. There is no evidence of metastatic spread from any of her multiple cancers. Patient should continue on Mobic despite the risk of bleeding as this is the only medication that has given her any durable benefit. We will trial capsaicin cream over the right hip to see if this helps topically. Outpatient follow-up may be required. She is a poor candidate from my standpoint for any type of intervention at this stage   Chronic cough -Wonder if related to lisinopril, however, that has been held during admission. -Continue Tussionex PRN and Scheduled Tessalon Perles  Lung mass -Seen on chest x-ray -Patient states that his bed there for a while and does not wish to have any intervention  Acute on Chronic kidney disease, stage III  -Creatinine 1.07 -Baseline creatinine 1.3 -Will continue to monitor BMP  Diabetes mellitus, type II -Metformin held -Continue insulin sliding scale a CBG monitoring -Resume metformin on discharge   Incomplete quadriplegia -Chronic, at baseline -PT consulted and pending  History of left lower extremity DVT -Continue Eliquis  Code Status: Full  Family Communication: Family at bedside  Disposition likely can discharge home a.m. on oral anti-biotics  Procedures  None  Consults   None  DVT Prophylaxis  [place]  Lab Results  Component Value Date   PLT  273 04/12/2015    Medications  Scheduled Meds: . apixaban  2.5 mg Oral BID  . atenolol  50 mg Oral Daily  . atorvastatin  10 mg Oral QPM  . benzonatate  100 mg Oral TID PC & HS  . capsicum   Topical TID  . insulin aspart  0-9 Units Subcutaneous TID WC  . levofloxacin  500 mg Oral Daily  . levothyroxine  75 mcg Oral QAC breakfast  . meloxicam  15 mg Oral Daily  . sodium chloride  1,000 mL Intravenous Once   Continuous Infusions: . sodium chloride 1,000 mL (04/13/15 1217)   PRN Meds:.chlorpheniramine-HYDROcodone, menthol-cetylpyridinium, phenol  Antibiotics     Anti-infectives    Start     Dose/Rate Route Frequency Ordered Stop   04/13/15 1530  levofloxacin (LEVAQUIN) tablet 500 mg     500 mg Oral Daily 04/13/15 1502     04/11/15 1900  cefTRIAXone (ROCEPHIN) 1 g in dextrose 5 % 50 mL IVPB  Status:  Discontinued     1 g 100 mL/hr over 30 Minutes Intravenous Every 24 hours 04/10/15 1930 04/13/15 1502   04/11/15 1900  azithromycin (ZITHROMAX) tablet 500 mg  Status:  Discontinued     500 mg Oral Every 24 hours 04/10/15 1930 04/13/15 1502   04/10/15 1730  cefTRIAXone (ROCEPHIN) 1 g in dextrose 5 % 50 mL IVPB     1 g 100 mL/hr over 30 Minutes Intravenous  Once 04/10/15 1727 04/10/15 1950   04/10/15 1730  azithromycin (ZITHROMAX) 500 mg in dextrose 5 % 250 mL IVPB  Status:  Discontinued     500 mg 250 mL/hr over 60 Minutes Intravenous  Once 04/10/15 1727 04/13/15 1502        Subjective:    Doing fair. Some moments of lucidity however does have some baseline confusion Family better No chest pain No nausea No vomiting No coughing No blurred vision No double vision. Does have some hip pain on the right side.   Objective:   Filed Vitals:   04/12/15 2144 04/13/15 0434 04/13/15 1019 04/13/15 1545  BP: 156/66 155/71 133/58 156/57  Pulse: 72 72 71 61  Temp: 98.6 F (37 C) 99.1 F (37.3 C)  99.7 F (37.6 C)  TempSrc: Oral Oral  Oral  Resp: '18 18  18  '$ Height:      Weight:      SpO2: 94% 93%  93%    Wt Readings from Last 3 Encounters:  04/10/15 62 kg (136 lb 11 oz)  03/31/15 63.05 kg (139 lb)  11/16/14 59.421 kg (131 lb)     Intake/Output Summary (Last 24 hours) at 04/13/15 1636 Last data filed at 04/13/15 0900  Gross per 24 hour  Intake   1730 ml  Output    500 ml  Net   1230 ml    Exam  General: Well developed, well nourished, no distress  HEENT: NCAT, mucous membranes moist.   Cardiovascular: S1 S2 auscultated, RRR, no murmurs  Respuiratory- clinically clear and no fremitus no resonance  Abdomen: Soft,  nontender, nondistended, + bowel sounds  Extremities: warm dry without cyanosis clubbing or edema  Data Review   Micro Results Recent Results (from the past 240 hour(s))  Urine culture     Status: None   Collection Time: 04/10/15  5:47 PM  Result Value Ref Range Status   Specimen Description URINE, CATHETERIZED  Final   Special Requests NONE  Final   Colony Count NO GROWTH Performed at  [facility]   Final   Culture NO GROWTH Performed at [facility]   Final   Report Status 04/11/2015 FINAL  Final  Blood culture (routine x 2)     Status: None (Preliminary result)   Collection Time: 04/10/15  6:58 PM  Result Value Ref Range Status   Specimen Description BLOOD LEFT ARM  Final   Special Requests BOTTLES DRAWN AEROBIC AND ANAEROBIC 10CC  Final   Culture   Final           BLOOD CULTURE RECEIVED NO GROWTH TO DATE CULTURE WILL BE HELD FOR 5 DAYS BEFORE ISSUING A FINAL NEGATIVE REPORT Performed at [facility]    Report Status PENDING  Incomplete  Blood culture (routine x 2)     Status: None (Preliminary result)   Collection Time: 04/10/15  7:05 PM  Result Value Ref Range Status   Specimen Description BLOOD LEFT HAND  Final   Special Requests BOTTLES DRAWN AEROBIC ONLY 10CC  Final   Culture   Final           BLOOD CULTURE RECEIVED NO GROWTH TO DATE CULTURE WILL BE HELD FOR 5 DAYS BEFORE ISSUING A FINAL NEGATIVE REPORT Performed at [facility]    Report Status PENDING  Incomplete    Radiology Reports Dg Chest 2 View  04/10/2015   CLINICAL DATA:  Dry cough for 2 months, weakness, RIGHT hip pain, history of colon cancer, thyroid cancer, lung mass  EXAM: CHEST  2 VIEW  COMPARISON:  03/31/2015  FINDINGS: Capsular calcification at BILATERAL breast prostheses.  Minimal enlargement of cardiac silhouette.  Mediastinal contours and pulmonary vascularity normal.  RIGHT perihilar mass again identified 6.2 x 4.2 cm in size.  Emphysematous and bronchitic changes  consistent with COPD.  Questionable RIGHT upper lobe infiltrate.  Remaining lungs clear.  No pleural effusion or pneumothorax.  Bones demineralized with degenerative disc disease changes thoracic spine and prior cervical spine fusion.  IMPRESSION: COPD changes with RIGHT lower lobe mass likely representing malignancy.  Question subtle RIGHT upper lobe infiltrate.  Minimal enlargement of cardiac silhouette.   Electronically Signed   By: Lavonia Dana M.D.   On: 04/10/2015 17:14   Dg Chest 2 View  03/31/2015   CLINICAL DATA:  One month of shortness of breath and productive cough ; known right lung mass with probable intrapulmonary metastases.  EXAM: CHEST  2 VIEW  COMPARISON:  PA and lateral chest x-ray of May 27, 2014 and CT scan of the chest of the same date.  FINDINGS: The lungs remain hyperinflated. The mass in the superior segment of the right lower lobe is again demonstrated. Its margins are better visualized today. It measures approximately 4.5 cm transversely by 5.8 cm in superior to inferior dimension by approximately 5 cm AP. No other masses are demonstrated. The heart and pulmonary vascularity are normal. The trachea is midline. There is no pleural effusion. The thoracic vertebral bodies are preserved in height. There are calcified breast implants bilaterally.  IMPRESSION: COPD with persistent superior segment right lower lobe mass. There is no acute pneumonia nor evidence of CHF.   Electronically Signed   By: David  Martinique M.D.   On: 03/31/2015 16:57   Dg Hip Unilat With Pelvis 2-3 Views Right  04/10/2015   CLINICAL DATA:  Forty-five year history of right hip pain  EXAM: RIGHT HIP (WITH PELVIS) 2-3 VIEWS  COMPARISON:  None.  FINDINGS: Severe right hip joint degenerative changes with joint  space narrowing and osteophytic spurring. Moderate degenerative changes are noted on the left. No acute hip fracture or plain film evidence of avascular necrosis. The pubic symphysis and SI joints demonstrate  degenerative changes but they are intact. No definite pelvic fractures.  IMPRESSION: Severe right hip joint degenerative changes but no acute bony abnormality.   Electronically Signed   By: Marijo Sanes M.D.   On: 04/10/2015 18:46    CBC  Recent Labs Lab 04/10/15 1717 04/12/15 0605  WBC 13.4* 10.7*  HGB 12.6 11.1*  HCT 37.6 34.7*  PLT 317 273  MCV 82.3 83.8  MCH 27.6 26.8  MCHC 33.5 32.0  RDW 13.9 14.0    Chemistries   Recent Labs Lab 04/10/15 1717 04/12/15 0605  NA 136 140  K 4.3 4.3  CL 97* 103  CO2 28 29  GLUCOSE 103* 96  BUN 40* 14  CREATININE 1.78* 1.07*  CALCIUM 8.8* 8.9  AST 36  --   ALT 29  --   ALKPHOS 53  --   BILITOT 0.9  --    ------------------------------------------------------------------------------------------------------------------ estimated creatinine clearance is 37.7 mL/min (by C-G formula based on Cr of 1.07). ------------------------------------------------------------------------------------------------------------------ No results for input(s): HGBA1C in the last 72 hours. ------------------------------------------------------------------------------------------------------------------ No results for input(s): CHOL, HDL, LDLCALC, TRIG, CHOLHDL, LDLDIRECT in the last 72 hours. ------------------------------------------------------------------------------------------------------------------ No results for input(s): TSH, T4TOTAL, T3FREE, THYROIDAB in the last 72 hours.  Invalid input(s): FREET3 ------------------------------------------------------------------------------------------------------------------ No results for input(s): VITAMINB12, FOLATE, FERRITIN, TIBC, IRON, RETICCTPCT in the last 72 hours.  Coagulation profile No results for input(s): INR, PROTIME in the last 168 hours.  No results for input(s): DDIMER in the last 72 hours.  Cardiac Enzymes No results for input(s): CKMB, TROPONINI, MYOGLOBIN in the last 168  hours.  Invalid input(s): CK ------------------------------------------------------------------------------------------------------------------ Invalid input(s): POCBNP   Verneita Griffes, MD [facility] [phone]

## 2015-04-14 ENCOUNTER — Other Ambulatory Visit: Payer: Self-pay | Admitting: Internal Medicine

## 2015-04-14 DIAGNOSIS — E0842 Diabetes mellitus due to underlying condition with diabetic polyneuropathy: Secondary | ICD-10-CM

## 2015-04-14 LAB — GLUCOSE, CAPILLARY
GLUCOSE-CAPILLARY: 106 mg/dL — AB (ref 65–99)
Glucose-Capillary: 99 mg/dL (ref 65–99)
Glucose-Capillary: 96 mg/dL (ref 65–99)

## 2015-04-14 MED ORDER — SORBITOL 70 % SOLN
20.0000 mL | Freq: Once | Status: AC
  Administered 2015-04-14: 20 mL via ORAL
  Filled 2015-04-14: qty 30

## 2015-04-14 MED ORDER — AMLODIPINE BESYLATE 5 MG PO TABS
5.0000 mg | ORAL_TABLET | Freq: Every day | ORAL | Status: DC
  Administered 2015-04-14: 5 mg via ORAL
  Filled 2015-04-14: qty 1

## 2015-04-14 MED ORDER — LEVOFLOXACIN 500 MG PO TABS: 500.0000 mg | ORAL_TABLET | Freq: Every day | ORAL | Status: DC

## 2015-04-14 MED ORDER — AMLODIPINE BESYLATE 5 MG PO TABS: 5.0000 mg | ORAL_TABLET | Freq: Every day | ORAL | Status: DC

## 2015-04-14 MED ORDER — CAPSAICIN 0.075 % EX CREA: TOPICAL_CREAM | Freq: Three times a day (TID) | CUTANEOUS | Status: DC

## 2015-04-14 MED ORDER — MELOXICAM 15 MG PO TABS: 15.0000 mg | ORAL_TABLET | Freq: Every day | ORAL | Status: DC

## 2015-04-14 MED ORDER — POLYETHYLENE GLYCOL 3350 17 GM/SCOOP PO POWD: 1.0000 | Freq: Once | ORAL | Status: AC

## 2015-04-14 NOTE — Discharge Summary (Signed)
Physician Discharge Summary  Cassandra Walker OBS:[id number] DOB: 1932-02-24 DOA: 04/10/2015  PCP: Alesia Richards, MD  Admit date: 04/10/2015 Discharge date: 04/14/2015  Time spent: 35 minutes  Recommendations for Outpatient Follow-up:  1. CBC + BMET in 1 week  to meds as below    please continue permanent discontinuation of the following medication   Lasix 20 daily  Gabapentin   Tramadol  RC's for meds  To complete 5 more days Levaquin 500 daily  Amodipine 5 mg daily  Added capsaicin topical ointment 0.075    continue meloxicam    Discharge Diagnoses:  Active Problems:   Lung mass   Incomplete quadriplegia at C5-6 level   CAP (community acquired pneumonia)   AKI (acute kidney injury)   Discharge Condition: Fair  Diet recommendation: hh low salt  Filed Weights   04/10/15 1627 04/10/15 2041  Weight: 61.145 kg (134 lb 12.8 oz) 62 kg (136 lb 11 oz)    History of present illness:  81 y.o. female known h/o RLL lung mass for the past 2 years not wishing any further workup, prior colon cancer, prior breast cancer, prior other malignancy, incomplete quadraplegia, DVT on Xarelto.  Patient presents to the ED with c/o persistent cough, SOB.  Symptoms have been ongoing for several months.  She was seen at [place] office last week and finished a course of levaquin and prednisone with minimal relief.  Patient also prescribed some lasix and potassium for diuresis as well as metformin for elevated A1C. Cough is non-productive.    Assessment & Plan    Sepsis secondary to probable postobstructive pneumonia -Upon admission, patient did have fever of 100.62F, other signs of sepsis including tachycardiae -Chest x-ray: COPD changes with right lower lobe mass likely representing malignancy, question subtle right upper lobe infiltrate -transitioned from azithromycin and ceftriaxone-->monotherapy Levaquin 04/13/15 -Patient has had cough for approximately 1 month or more -Continue  Tussionex and Tessalon Perles -Blood cultures show no growth to date -Strep pneumonia and legionella urine antigens negative -I had a long and detailed discussion with patient's health care power of attorney and granddaughter Asencion Partridge and explained to her that patient's pneumonia is likely secondary to postobstructive causes and may recur. I think that 10-14 days of therapy with antiemetics would be reasonable however this will probably recur if we do not intervene on the lung mass which the patient does not want to do. This will need to be just moving forward with primary care physician as an outpatient. Patient will need OP f/U Dr. Melvyn Novas   Hip pain right-sided   Hip x-rays performed this admission shows severe osteoarthritis with spurring on the right side. There is no evidence of metastatic spread from any of her multiple cancers. Patient should continue on Mobic despite the risk of bleeding as this is the only medication that has given her any durable benefit. We will trial capsaicin cream over the right hip to see if this helps topically. Outpatient follow-up may be required. She is a poor candidate from my standpoint for any type of intervention at this stage  Chronic cough -Wonder if related to lisinopril, however, that has been held during admission. -Continue Tussionex PRN and Scheduled Tessalon Perles  Lung mass -Seen on chest x-ray -Patient states that his bed there for a while and does not wish to have any intervention  Acute on Chronic kidney disease, stage III   -Creatinine 1.07 -Baseline creatinine 1.3 -Will continue to monitor BMP  Diabetes mellitus, type II -Metformin held,  resumed on d/c home -was on  insulin sliding scale a CBG monitoring  Incomplete quadriplegia -Chronic, at baseline -PT consulted and pending  History of left lower extremity DVT -Continue Eliquis  Constipation Miraalx added on d/c home    Discharge Exam: Filed Vitals:   04/14/15 0532  BP:  172/68  Pulse: 67  Temp: 98.6 F (37 C)  Resp: 18   Well, no new issues  General: eomi, ncat Cardiovascular:  s1 s 2no m/r/g Respiratory: clear  Discharge Instructions    Current Discharge Medication List    CONTINUE these medications which have NOT CHANGED   Details  atenolol (TENORMIN) 50 MG tablet Take 1 tablet (50 mg total) by mouth daily. Qty: 30 tablet, Refills: 11    atorvastatin (LIPITOR) 10 MG tablet TAKE 1 TABLET BY MOUTH EVERY EVENING Qty: 90 tablet, Refills: 0    benzonatate (TESSALON PERLES) 100 MG capsule Take 1 capsule (100 mg total) by mouth every 6 (six) hours as needed for cough. Qty: 30 capsule, Refills: 1   Associated Diagnoses: Acute URI    Cholecalciferol (VITAMIN D3 HIGH POTENCY PO) Take 5,000 Units by mouth daily.    fish oil-omega-3 fatty acids 1000 MG capsule Take 1 g by mouth daily.     furosemide (LASIX) 20 MG tablet Take 1 tablet (20 mg total) by mouth daily. Qty: 60 tablet, Refills: 11    gabapentin (NEURONTIN) 100 MG capsule Take 200 mg by mouth 3 (three) times daily.    levothyroxine (SYNTHROID, LEVOTHROID) 75 MCG tablet Take 1 tablet (75 mcg total) by mouth daily. Qty: 90 tablet, Refills: 2    lisinopril (PRINIVIL,ZESTRIL) 20 MG tablet Take 20 mg by mouth daily.    metFORMIN (GLUCOPHAGE XR) 500 MG 24 hr tablet Take 1 tablet (500 mg total) by mouth daily with breakfast. Qty: 30 tablet, Refills: 11    potassium chloride SA (K-DUR,KLOR-CON) 20 MEQ tablet TAKE 1 TABLET BY MOUTH EVERY DAY WITH YOUR FLUID PILL Qty: 90 tablet, Refills: 0    predniSONE (DELTASONE) 20 MG tablet 3 tabs po day one, then 2 tabs daily x 4 days Qty: 11 tablet, Refills: 0   Associated Diagnoses: Acute URI    !! PRESCRIPTION MEDICATION Take 0.5 drops by mouth 3 (three) times daily. Medication:Bladder/Spinal Detox liquid. Take at least 5 minutes away from food or other remedies    !! PRESCRIPTION MEDICATION Take 0.5 drops by mouth 3 (three) times daily.  Medication: Large Intestine detox. Take at least 5 minutes away from food or other remedies    !! PRESCRIPTION MEDICATION Take 0.5 drops by mouth 3 (three) times daily. Medication: Calendula Support. Take at least 5 minutes away from food or other remedies    rivaroxaban (XARELTO) 20 MG TABS tablet Take 1 tablet (20 mg total) by mouth daily with supper. Qty: 30 tablet, Refills: 0   Associated Diagnoses: Left leg DVT    traMADol (ULTRAM) 50 MG tablet TAKE 1/2-1 TABLET BY MOUTH THREE TIMES DAILY AS NEEDED FOR PAIN Qty: 90 tablet, Refills: 0     !! - Potential duplicate medications found. Please discuss with provider.     Allergies  Allergen Reactions  . Codeine Other (See Comments)    Jittery, nervous & hallucinations  . Oxybutynin     Tired      The results of significant diagnostics from this hospitalization (including imaging, microbiology, ancillary and laboratory) are listed below for reference.    Significant Diagnostic Studies: Dg Chest 2 View  04/10/2015  CLINICAL DATA:  Dry cough for 2 months, weakness, RIGHT hip pain, history of colon cancer, thyroid cancer, lung mass  EXAM: CHEST  2 VIEW  COMPARISON:  03/31/2015  FINDINGS: Capsular calcification at BILATERAL breast prostheses.  Minimal enlargement of cardiac silhouette.  Mediastinal contours and pulmonary vascularity normal.  RIGHT perihilar mass again identified 6.2 x 4.2 cm in size.  Emphysematous and bronchitic changes consistent with COPD.  Questionable RIGHT upper lobe infiltrate.  Remaining lungs clear.  No pleural effusion or pneumothorax.  Bones demineralized with degenerative disc disease changes thoracic spine and prior cervical spine fusion.  IMPRESSION: COPD changes with RIGHT lower lobe mass likely representing malignancy.  Question subtle RIGHT upper lobe infiltrate.  Minimal enlargement of cardiac silhouette.   Electronically Signed   By: Lavonia Dana M.D.   On: 04/10/2015 17:14   Dg Chest 2 View  03/31/2015    CLINICAL DATA:  One month of shortness of breath and productive cough ; known right lung mass with probable intrapulmonary metastases.  EXAM: CHEST  2 VIEW  COMPARISON:  PA and lateral chest x-ray of May 27, 2014 and CT scan of the chest of the same date.  FINDINGS: The lungs remain hyperinflated. The mass in the superior segment of the right lower lobe is again demonstrated. Its margins are better visualized today. It measures approximately 4.5 cm transversely by 5.8 cm in superior to inferior dimension by approximately 5 cm AP. No other masses are demonstrated. The heart and pulmonary vascularity are normal. The trachea is midline. There is no pleural effusion. The thoracic vertebral bodies are preserved in height. There are calcified breast implants bilaterally.  IMPRESSION: COPD with persistent superior segment right lower lobe mass. There is no acute pneumonia nor evidence of CHF.   Electronically Signed   By: David  Martinique M.D.   On: 03/31/2015 16:57   Dg Hip Unilat With Pelvis 2-3 Views Right  04/10/2015   CLINICAL DATA:  Forty-five year history of right hip pain  EXAM: RIGHT HIP (WITH PELVIS) 2-3 VIEWS  COMPARISON:  None.  FINDINGS: Severe right hip joint degenerative changes with joint space narrowing and osteophytic spurring. Moderate degenerative changes are noted on the left. No acute hip fracture or plain film evidence of avascular necrosis. The pubic symphysis and SI joints demonstrate degenerative changes but they are intact. No definite pelvic fractures.  IMPRESSION: Severe right hip joint degenerative changes but no acute bony abnormality.   Electronically Signed   By: Marijo Sanes M.D.   On: 04/10/2015 18:46    Microbiology: Recent Results (from the past 240 hour(s))  Urine culture     Status: None   Collection Time: 04/10/15  5:47 PM  Result Value Ref Range Status   Specimen Description URINE, CATHETERIZED  Final   Special Requests NONE  Final   Colony Count NO GROWTH Performed at  [facility]   Final   Culture NO GROWTH Performed at [facility]   Final   Report Status 04/11/2015 FINAL  Final  Blood culture (routine x 2)     Status: None (Preliminary result)   Collection Time: 04/10/15  6:58 PM  Result Value Ref Range Status   Specimen Description BLOOD LEFT ARM  Final   Special Requests BOTTLES DRAWN AEROBIC AND ANAEROBIC 10CC  Final   Culture   Final           BLOOD CULTURE RECEIVED NO GROWTH TO DATE CULTURE WILL BE HELD FOR 5 DAYS BEFORE  ISSUING A FINAL NEGATIVE REPORT Performed at [facility]    Report Status PENDING  Incomplete  Blood culture (routine x 2)     Status: None (Preliminary result)   Collection Time: 04/10/15  7:05 PM  Result Value Ref Range Status   Specimen Description BLOOD LEFT HAND  Final   Special Requests BOTTLES DRAWN AEROBIC ONLY 10CC  Final   Culture   Final           BLOOD CULTURE RECEIVED NO GROWTH TO DATE CULTURE WILL BE HELD FOR 5 DAYS BEFORE ISSUING A FINAL NEGATIVE REPORT Performed at [facility]    Report Status PENDING  Incomplete     Labs: Basic Metabolic Panel:  Recent Labs Lab 04/10/15 1717 04/12/15 0605  NA 136 140  K 4.3 4.3  CL 97* 103  CO2 28 29  GLUCOSE 103* 96  BUN 40* 14  CREATININE 1.78* 1.07*  CALCIUM 8.8* 8.9   Liver Function Tests:  Recent Labs Lab 04/10/15 1717  AST 36  ALT 29  ALKPHOS 53  BILITOT 0.9  PROT 6.5  ALBUMIN 2.7*   No results for input(s): LIPASE, AMYLASE in the last 168 hours. No results for input(s): AMMONIA in the last 168 hours. CBC:  Recent Labs Lab 04/10/15 1717 04/12/15 0605  WBC 13.4* 10.7*  HGB 12.6 11.1*  HCT 37.6 34.7*  MCV 82.3 83.8  PLT 317 273   Cardiac Enzymes: No results for input(s): CKTOTAL, CKMB, CKMBINDEX, TROPONINI in the last 168 hours. BNP: BNP (last 3 results)  Recent Labs  04/10/15 1717  BNP 110.6*    ProBNP (last 3 results)  Recent Labs  03/31/15 1534  PROBNP 1872.00*     CBG:  Recent Labs Lab 04/13/15 0739 04/13/15 1220 04/13/15 1703 04/13/15 2104 04/14/15 0805  GLUCAP 106* 105* 97 108* 99       Signed:  Emilyn Ruble, JAI-GURMUKH  [facility] 04/14/2015, 10:21 AM

## 2015-04-14 NOTE — Progress Notes (Signed)
Discharged home, self care. Daughter a the bedside at the time of D/C and all questions answered. Pt to continue xarelto as anticoagulant per Dr Verlon Au who explained rationale to daughter over the phone.

## 2015-04-15 ENCOUNTER — Other Ambulatory Visit: Payer: Self-pay | Admitting: Internal Medicine

## 2015-04-17 LAB — CULTURE, BLOOD (ROUTINE X 2)
CULTURE: NO GROWTH
Culture: NO GROWTH

## 2015-04-18 ENCOUNTER — Other Ambulatory Visit: Payer: Self-pay | Admitting: Internal Medicine

## 2015-04-19 ENCOUNTER — Telehealth: Payer: Self-pay | Admitting: Internal Medicine

## 2015-04-19 NOTE — Telephone Encounter (Signed)
This many months late it may or may not be approp to do the PET so let's get her in for re-consult first

## 2015-04-19 NOTE — Telephone Encounter (Signed)
Spoke with patient's grand daughter.  She wants to proceed with the PET scan per [facility] recommendations at [place] on 07/21/2014.  Grand daughter wants to know if patient needs to come in to be seen by Dr. Melvyn Novas first or can she get PET scan and then schedule follow up with Dr. Melvyn Novas after?  MW - please advise.

## 2015-04-19 NOTE — Telephone Encounter (Signed)
Patients grand daughter notified and scheduled consult with Dr. Melvyn Novas for next Tuesday. Nothing further needed.

## 2015-04-19 NOTE — Telephone Encounter (Signed)
Myrtie Cruise returned call for grandmother - [phone]

## 2015-04-19 NOTE — Telephone Encounter (Signed)
lmtcb

## 2015-04-26 ENCOUNTER — Encounter: Payer: Self-pay | Admitting: Physician Assistant

## 2015-04-26 ENCOUNTER — Ambulatory Visit (INDEPENDENT_AMBULATORY_CARE_PROVIDER_SITE_OTHER): Payer: Medicare HMO | Admitting: Internal Medicine

## 2015-04-26 ENCOUNTER — Ambulatory Visit (INDEPENDENT_AMBULATORY_CARE_PROVIDER_SITE_OTHER): Payer: Medicare HMO | Admitting: Physician Assistant

## 2015-04-26 ENCOUNTER — Encounter: Payer: Self-pay | Admitting: Internal Medicine

## 2015-04-26 VITALS — BP 132/70 | HR 82 | Ht 65.0 in | Wt 133.8 lb

## 2015-04-26 VITALS — BP 132/72 | HR 80 | Temp 98.2°F | Resp 16 | Ht 65.0 in | Wt 132.0 lb

## 2015-04-26 DIAGNOSIS — R918 Other nonspecific abnormal finding of lung field: Secondary | ICD-10-CM | POA: Diagnosis not present

## 2015-04-26 DIAGNOSIS — Z79899 Other long term (current) drug therapy: Secondary | ICD-10-CM

## 2015-04-26 DIAGNOSIS — I10 Essential (primary) hypertension: Secondary | ICD-10-CM

## 2015-04-26 DIAGNOSIS — N189 Chronic kidney disease, unspecified: Secondary | ICD-10-CM

## 2015-04-26 DIAGNOSIS — J984 Other disorders of lung: Secondary | ICD-10-CM

## 2015-04-26 DIAGNOSIS — M25551 Pain in right hip: Secondary | ICD-10-CM

## 2015-04-26 LAB — HEPATIC FUNCTION PANEL
ALBUMIN: 3.5 g/dL (ref 3.5–5.2)
ALT: 11 U/L (ref 0–35)
AST: 17 U/L (ref 0–37)
Alkaline Phosphatase: 69 U/L (ref 39–117)
BILIRUBIN DIRECT: 0.1 mg/dL (ref 0.0–0.3)
Indirect Bilirubin: 0.2 mg/dL (ref 0.2–1.2)
TOTAL PROTEIN: 7 g/dL (ref 6.0–8.3)
Total Bilirubin: 0.3 mg/dL (ref 0.2–1.2)

## 2015-04-26 LAB — BASIC METABOLIC PANEL WITH GFR
BUN: 28 mg/dL — ABNORMAL HIGH (ref 6–23)
CALCIUM: 10.9 mg/dL — AB (ref 8.4–10.5)
CO2: 31 mEq/L (ref 19–32)
CREATININE: 1.15 mg/dL — AB (ref 0.50–1.10)
Chloride: 102 mEq/L (ref 96–112)
GFR, EST NON AFRICAN AMERICAN: 45 mL/min — AB
GFR, Est African American: 52 mL/min — ABNORMAL LOW
Glucose, Bld: 84 mg/dL (ref 70–99)
Potassium: 4.6 mEq/L (ref 3.5–5.3)
Sodium: 145 mEq/L (ref 135–145)

## 2015-04-26 LAB — CBC WITH DIFFERENTIAL/PLATELET
BASOS ABS: 0 10*3/uL (ref 0.0–0.1)
Basophils Relative: 0 % (ref 0–1)
Eosinophils Absolute: 0.4 10*3/uL (ref 0.0–0.7)
Eosinophils Relative: 4 % (ref 0–5)
HCT: 35.5 % — ABNORMAL LOW (ref 36.0–46.0)
Hemoglobin: 11.6 g/dL — ABNORMAL LOW (ref 12.0–15.0)
LYMPHS PCT: 18 % (ref 12–46)
Lymphs Abs: 1.7 10*3/uL (ref 0.7–4.0)
MCH: 27.4 pg (ref 26.0–34.0)
MCHC: 32.7 g/dL (ref 30.0–36.0)
MCV: 83.9 fL (ref 78.0–100.0)
MPV: 9.7 fL (ref 8.6–12.4)
Monocytes Absolute: 0.8 10*3/uL (ref 0.1–1.0)
Monocytes Relative: 8 % (ref 3–12)
NEUTROS ABS: 6.8 10*3/uL (ref 1.7–7.7)
Neutrophils Relative %: 70 % (ref 43–77)
PLATELETS: 352 10*3/uL (ref 150–400)
RBC: 4.23 MIL/uL (ref 3.87–5.11)
RDW: 14.6 % (ref 11.5–15.5)
WBC: 9.7 10*3/uL (ref 4.0–10.5)

## 2015-04-26 LAB — MAGNESIUM: Magnesium: 2.1 mg/dL (ref 1.5–2.5)

## 2015-04-26 MED ORDER — DEXAMETHASONE SODIUM PHOSPHATE 100 MG/10ML IJ SOLN
10.0000 mg | Freq: Once | INTRAMUSCULAR | Status: AC
  Administered 2015-04-26: 10 mg via INTRAMUSCULAR

## 2015-04-26 MED ORDER — HYDROCODONE-ACETAMINOPHEN 5-325 MG PO TABS: 1.0000 | ORAL_TABLET | Freq: Two times a day (BID) | ORAL | Status: DC | PRN

## 2015-04-26 NOTE — Assessment & Plan Note (Signed)
ACE inhibitors are problematic in  pts with airway complaints because  even experienced pulmonologists can't always distinguish ace effects from copd/asthma/pnds/ allergies etc.  By themselves they don't actually cause a problem, much like oxygen can't by itself start a fire, but they certainly serve as a powerful catalyst or enhancer for any "fire"  or inflammatory process in the upper airway, be it caused by an ET  tube or more commonly reflux (especially in the obese or pts with known GERD or who are on biphoshonates) or URI's, due to interference with bradykinin clearance.  The effects of acei on bradykinin levels occurs in 100% of pt's on acei (unless they surreptitiously stop the med!) but the classic cough is only reported in 5%.  This leaves 95% of pts on acei's  with a variety of syndromes including no identifiable symptom in most  vs non-specific symptoms that wax and wane depending on what other insult is occuring at the level of the upper airway  ( a relatively bening bronchopna resulting in admit would be an example)  rec avoid acei here in future but defer final choice to primary care.

## 2015-04-26 NOTE — Progress Notes (Signed)
Subjective:    Patient ID: Cassandra Walker, female    DOB: 08-Dec-1931,     MRN: [id number]  HPI  52 yowm quit smoking in 1974 with R sup seg lung mass for which she has so far refused further w/u.   Admit date: 04/10/2015 Discharge date: 04/14/2015   Discharge Diagnoses:  Active Problems:  Lung mass  Incomplete quadriplegia at C5-6 level  CAP (community acquired pneumonia)  AKI (acute kidney injury)   Discharge Condition: Fair  Diet recommendation: hh low salt  Filed Weights   04/10/15 1627 04/10/15 2041  Weight: 61.145 kg (134 lb 12.8 oz) 62 kg (136 lb 11 oz)    History of present illness:  81 y.o. female known h/o RLL lung mass for the past 2 years not wishing any further workup, prior colon cancer, prior breast cancer, prior other malignancy, incomplete quadraplegia, DVT on Xarelto. Patient presents to the ED with c/o persistent cough, SOB. Symptoms have been ongoing for several months. She was seen at [place] office last week and finished a course of levaquin and prednisone with minimal relief. Patient also prescribed some lasix and potassium for diuresis as well as metformin for elevated A1C. Cough is non-productive.    Assessment & Plan   Sepsis secondary to probable postobstructive pneumonia -Upon admission, patient did have fever of 100.42F, other signs of sepsis including tachycardiae -Chest x-ray: COPD changes with right lower lobe mass likely representing malignancy, question subtle right upper lobe infiltrate -transitioned from azithromycin and ceftriaxone-->monotherapy Levaquin 04/13/15 -Patient has had cough for approximately 1 month or more -Continue Tussionex and Tessalon Perles -Blood cultures show no growth to date -Strep pneumonia and legionella urine antigens negative -I had a long and detailed discussion with patient's health care power of attorney and granddaughter Cassandra Walker and explained to her that patient's pneumonia is likely secondary  to postobstructive causes and may recur. I think that 10-14 days of therapy with antiemetics would be reasonable however this will probably recur if we do not intervene on the lung mass which the patient does not want to do. This will need to be just moving forward with primary care physician as an outpatient. Patient will need OP f/U Dr. Melvyn Novas   Hip pain right-sided  Hip x-rays performed this admission shows severe osteoarthritis with spurring on the right side. There is no evidence of metastatic spread from any of her multiple cancers. Patient should continue on Mobic despite the risk of bleeding as this is the only medication that has given her any durable benefit. We will trial capsaicin cream over the right hip to see if this helps topically. Outpatient follow-up may be required. She is a poor candidate from my standpoint for any type of intervention at this stage  Chronic cough -Wonder if related to lisinopril, however, that has been held during admission. -Continue Tussionex PRN and Scheduled Tessalon Perles  Lung mass -Seen on chest x-ray -Patient states that his bed there for a while and does not wish to have any intervention  Acute on Chronic kidney disease, stage III  -Creatinine 1.07 -Baseline creatinine 1.3 -Will continue to monitor BMP  Diabetes mellitus, type II -Metformin held, resumed on d/c home -was on insulin sliding scale a CBG monitoring  Incomplete quadriplegia -Chronic, at baseline -PT consulted and pending  History of left lower extremity DVT -Continue Eliquis  Constipation Miraalx added on d/c home       04/26/2015  Consultatoin/ Dr Melford Aase re Lung mass/ prev saw [facility]  Chief Complaint  Patient presents with  . Pulmonary Consult    Former Dr Gwenette Greet pt. She was recently hospitalized with PNA. She c/o feeling weak. She had declined PET scan in the past to eval lung mass but now interested in scheduling this.   fatigue for months then admit  Slowed  down by R hip pain  Not sob = her baseline / not limited by sob on no pulmonary meds  Cough has resolved but note failed to respond to pred/levaquin as outpt resulting in admit for "pna" which was really not impressive at all on ACEi  No ongoing  chronic cough or cp or chest tightness, subjective wheeze overt sinus or hb symptoms. No unusual exp hx or h/o childhood pna/ asthma or knowledge of premature birth.  Sleeping ok without nocturnal  or early am exacerbation  of respiratory  c/o's or need for noct saba. Also denies any obvious fluctuation of symptoms with weather or environmental changes or other aggravating or alleviating factors except as outlined above   Current Medications, Allergies, Complete Past Medical History, Past Surgical History, Family History, and Social History were reviewed in [employer] record.                Review of Systems  Constitutional: Negative for fever, chills and unexpected weight change.  HENT: Negative for congestion, dental problem, ear pain, nosebleeds, postnasal drip, rhinorrhea, sinus pressure, sneezing, sore throat, trouble swallowing and voice change.   Eyes: Negative for visual disturbance.  Respiratory: Negative for cough, choking and shortness of breath.   Cardiovascular: Negative for chest pain and leg swelling.  Gastrointestinal: Negative for vomiting, abdominal pain and diarrhea.  Genitourinary: Negative for difficulty urinating.  Musculoskeletal: Negative for arthralgias.  Skin: Negative for rash.  Neurological: Negative for tremors, syncope and headaches.  Hematological: Does not bruise/bleed easily.       Objective:   Physical Exam  Very frail elderly wf can only get on exam table with one person full assist/ walks with rolling walker with brakes  Wt Readings from Last 3 Encounters:  04/26/15 133 lb 12.8 oz (60.691 kg)  04/10/15 136 lb 11 oz (62 kg)  03/31/15 139 lb (63.05 kg)    Vital signs  reviewed  HEENT: nl dentition, turbinates, and orophanx. Nl external ear canals without cough reflex   NECK :  without JVD/Nodes/TM/ nl carotid upstrokes bilaterally   LUNGS: no acc muscle use, clear to A and P bilaterally without cough on insp or exp maneuvers   CV:  RRR  no s3 or murmur or increase in P2, no edema   ABD:  soft and nontender with nl excursion in the supine position. No bruits or organomegaly, bowel sounds nl  MS:  warm without deformities, calf tenderness, cyanosis or clubbing  SKIN: warm and dry without lesions    NEURO:  alert, approp, no deficits     I personally reviewed images and agree with radiology impression as follows:  CXR:  04/10/15 COPD changes with RIGHT lower lobe mass likely representing malignancy.  Question subtle RIGHT upper lobe infiltrate.  Minimal enlargement of cardiac silhouette.      Assessment & Plan:

## 2015-04-26 NOTE — Patient Instructions (Signed)
Call me when/ if you want [place] to do a PET Scan - if you start having symptoms like shortness of breath, bad cough, pain when you breath we would need a CT first

## 2015-04-26 NOTE — Progress Notes (Signed)
Assessment and Plan: Right hip pain + bursitis tenderness but pain with internal rotation of right hip Will give infection here and refer to ortho [facility] (has been there before)  for possible injection Right Hip Bursitis-  Injection- area cleaned with alcohol, Dexamethasone '10mg'$  and 1 CC lidocaine injected into greater trochanteric, tolerated welll with immediate relief.  At this time patient and granddaughter are much more concerned with quality of life versus quantity norco 5/325 BID PRN  Will try to get her off some medications Stop the fluid pill and HCTZ, check BMP and Mag ( she is on 3 a day) Get off lipitor for now Can stop tramadol, gabapentin, and mobic    HPI 82 y.o.female with history of RLL lung mass, colon cancer, breast cancer, incomplete quadraplegia, DVT on Xarelto presents for follow up from the hospital. Admit date to the hospital was 04/10/15, patient was discharged from the hospital on 04/14/15, patient was admitted for: CAP failing out patient treatment. She has had a follow up with Dr. Melvyn Novas regarding the RLL mass and the granddaughter Asencion Partridge and Mrs. Curiale are leaning toward palative care. Requesting CBC/BMP from hospital.   She states the cough is better but her biggest complaint is right hip pain. It is worse with walking, it will catch on her,  it is right thigh pain that radiates to right inguinal area. Tramadol did not help and gabapentin did not help. Has been on mobic since the hospital without nay help.    Discussed need for xarelto, due to immobility and potential lung cancer, will continue the xarelto. Granddaughter and patient understands fall risk/risk of bleeds. She is off the metformin due to hypoglycemia.  Images while in the hospital: Dg Chest 2 View  04/10/2015   CLINICAL DATA:  Dry cough for 2 months, weakness, RIGHT hip pain, history of colon cancer, thyroid cancer, lung mass  EXAM: CHEST  2 VIEW  COMPARISON:  03/31/2015  FINDINGS: Capsular  calcification at BILATERAL breast prostheses.  Minimal enlargement of cardiac silhouette.  Mediastinal contours and pulmonary vascularity normal.  RIGHT perihilar mass again identified 6.2 x 4.2 cm in size.  Emphysematous and bronchitic changes consistent with COPD.  Questionable RIGHT upper lobe infiltrate.  Remaining lungs clear.  No pleural effusion or pneumothorax.  Bones demineralized with degenerative disc disease changes thoracic spine and prior cervical spine fusion.  IMPRESSION: COPD changes with RIGHT lower lobe mass likely representing malignancy.  Question subtle RIGHT upper lobe infiltrate.  Minimal enlargement of cardiac silhouette.   Electronically Signed   By: Lavonia Dana M.D.   On: 04/10/2015 17:14   Dg Hip Unilat With Pelvis 2-3 Views Right  04/10/2015   CLINICAL DATA:  Forty-five year history of right hip pain  EXAM: RIGHT HIP (WITH PELVIS) 2-3 VIEWS  COMPARISON:  None.  FINDINGS: Severe right hip joint degenerative changes with joint space narrowing and osteophytic spurring. Moderate degenerative changes are noted on the left. No acute hip fracture or plain film evidence of avascular necrosis. The pubic symphysis and SI joints demonstrate degenerative changes but they are intact. No definite pelvic fractures.  IMPRESSION: Severe right hip joint degenerative changes but no acute bony abnormality.   Electronically Signed   By: Marijo Sanes M.D.   On: 04/10/2015 18:46    Past Medical History  Diagnosis Date  . Osteoarthritis   . Hyperlipidemia   . GERD (gastroesophageal reflux disease)   . Peripheral neuropathy   . Osteopenia   . DDD (degenerative  disc disease)   . Shingles   . Colon cancer 1977  . Thyroid cancer 1966  . Peripheral vascular disease   . Deep vein blood clot of left lower extremity 05/06/13     Allergies  Allergen Reactions  . Codeine Other (See Comments)    Jittery, nervous & hallucinations  . Oxybutynin     Tired      Current Outpatient Prescriptions on  File Prior to Visit  Medication Sig Dispense Refill  . atenolol (TENORMIN) 50 MG tablet Take 1 tablet (50 mg total) by mouth daily. 30 tablet 11  . atorvastatin (LIPITOR) 10 MG tablet TAKE 1 TABLET BY MOUTH EVERY EVENING 90 tablet 0  . capsicum (ZOSTRIX) 0.075 % topical cream Apply topically 3 (three) times daily. 28.3 g 0  . Cholecalciferol (VITAMIN D3 HIGH POTENCY PO) Take 5,000 Units by mouth daily.    . fish oil-omega-3 fatty acids 1000 MG capsule Take 1 g by mouth daily.     Marland Kitchen gabapentin (NEURONTIN) 100 MG capsule TAKE 2 CAPSULES BY MOUTH THREE TIMES DAILY FOR PAIN 540 capsule 0  . levothyroxine (SYNTHROID, LEVOTHROID) 75 MCG tablet Take 1 tablet (75 mcg total) by mouth daily. 90 tablet 2  . lisinopril (PRINIVIL,ZESTRIL) 20 MG tablet Take 20 mg by mouth daily.    . meloxicam (MOBIC) 15 MG tablet Take 1 tablet (15 mg total) by mouth daily. 20 tablet 0  . polyethylene glycol powder (MIRALAX) powder Take 255 g by mouth once. (Patient taking differently: Take 1 Container by mouth daily as needed. ) 255 g 0  . potassium chloride SA (K-DUR,KLOR-CON) 20 MEQ tablet TAKE 1 TABLET BY MOUTH EVERY DAY WITH YOUR FLUID PILL 90 tablet 0  . rivaroxaban (XARELTO) 20 MG TABS tablet Take 1 tablet (20 mg total) by mouth daily with supper. 30 tablet 0  . amLODipine (NORVASC) 5 MG tablet Take 1 tablet (5 mg total) by mouth daily. (Patient not taking: Reported on 04/26/2015) 30 tablet 0   No current facility-administered medications on file prior to visit.    ROS: all negative except above.   Physical Exam: Filed Weights   04/26/15 1123  Weight: 132 lb (59.875 kg)   BP 132/72 mmHg  Pulse 80  Temp(Src) 98.2 F (36.8 C)  Resp 16  Ht '5\' 5"'$  (1.651 m)  Wt 132 lb (59.875 kg)  BMI 21.97 kg/m2 HEENT: normocephalic, sclerae anicteric, TMs pearly, nares patent, no discharge or erythema, pharynx normal Oral cavity: MMM, no lesions Neck: supple, no lymphadenopathy, no thyromegaly, no masses Heart: RRR, normal  S1, S2, no murmurs Lungs: CTAB, no wheezes, rhonchi, or rales Abdomen: +bs, soft, non tender, non distended, no masses, no hepatomegaly, no splenomegaly Musculoskeletal: Patient is not able to ambulate well.  Gait is  antalgic.Right hip: positives: decreased abduction, tenderness over greater trochanter and pain with internal rotation and negatives: no pain with heel impact pulses full without SI pain.Good distal sensations and pulses bilaterally. no swelling, no obvious deformity Extremities: no edema, no cyanosis, no clubbing Pulses: 2+ symmetric, upper and lower extremities Neurological: alert, oriented x 3, CN2-12 intact, strength 4/5 upper extremities and lower extremities,  DTRs 2+ throughout, no cerebellar signs, gait antalgic with walker Psychiatric: normal affect, behavior normal, pleasant     Vicie Mutters, PA-C 11:32 AM [facility]

## 2015-04-26 NOTE — Patient Instructions (Signed)
Medications Discontinued During This Encounter  Medication Reason  . amLODipine (NORVASC) 5 MG tablet   . atorvastatin (LIPITOR) 10 MG tablet   . gabapentin (NEURONTIN) 100 MG capsule   . potassium chloride SA (K-DUR,KLOR-CON) 20 MEQ tablet     Hip Pain Your hip is the joint between your upper legs and your lower pelvis. The bones, cartilage, tendons, and muscles of your hip joint perform a lot of work each day supporting your body weight and allowing you to move around. Hip pain can range from a minor ache to severe pain in one or both of your hips. Pain may be felt on the inside of the hip joint near the groin, or the outside near the buttocks and upper thigh. You may have swelling or stiffness as well.  HOME CARE INSTRUCTIONS   Take medicines only as directed by your health care provider.  Apply ice to the injured area:  Put ice in a plastic bag.  Place a towel between your skin and the bag.  Leave the ice on for 15-20 minutes at a time, 3-4 times a day.  Keep your leg raised (elevated) when possible to lessen swelling.  Avoid activities that cause pain.  Follow specific exercises as directed by your health care provider.  Sleep with a pillow between your legs on your most comfortable side.  Record how often you have hip pain, the location of the pain, and what it feels like. SEEK MEDICAL CARE IF:   You are unable to put weight on your leg.  Your hip is red or swollen or very tender to touch.  Your pain or swelling continues or worsens after 1 week.  You have increasing difficulty walking.  You have a fever. SEEK IMMEDIATE MEDICAL CARE IF:   You have fallen.  You have a sudden increase in pain and swelling in your hip. MAKE SURE YOU:   Understand these instructions.  Will watch your condition.  Will get help right away if you are not doing well or get worse. Document Released: 04/11/2010 Document Revised: 03/08/2014 Document Reviewed: 06/18/2013 [facility]  Patient Information 2015 [place], [place]. This information is not intended to replace advice given to you by your health care provider. Make sure you discuss any questions you have with your health care provider.  Hip Bursitis Bursitis is a swelling and soreness (inflammation) of a fluid-filled sac (bursa). This sac overlies and protects the joints.  CAUSES   Injury.  Overuse of the muscles surrounding the joint.  Arthritis.  Gout.  Infection.  Cold weather.  Inadequate warm-up and conditioning prior to activities. The cause may not be known.  SYMPTOMS   Mild to severe irritation.  Tenderness and swelling over the outside of the hip.  Pain with motion of the hip.  If the bursa becomes infected, a fever may be present. Redness, tenderness, and warmth will develop over the hip. Symptoms usually lessen in 3 to 4 weeks with treatment, but can come back. TREATMENT If conservative treatment does not work, your caregiver may advise draining the bursa and injecting cortisone into the area. This may speed up the healing process. This may also be used as an initial treatment of choice. HOME CARE INSTRUCTIONS   Apply ice to the affected area for 15-20 minutes every 3 to 4 hours while awake for the first 2 days. Put the ice in a plastic bag and place a towel between the bag of ice and your skin.  Rest the painful joint  as much as possible, but continue to put the joint through a normal range of motion at least 4 times per day. When the pain lessens, begin normal, slow movements and usual activities to help prevent stiffness of the hip.  Only take over-the-counter or prescription medicines for pain, discomfort, or fever as directed by your caregiver.  Use crutches to limit weight bearing on the hip joint, if advised.  Elevate your painful hip to reduce swelling. Use pillows for propping and cushioning your legs and hips.  Gentle massage may provide comfort and decrease swelling. SEEK  IMMEDIATE MEDICAL CARE IF:   Your pain increases even during treatment, or you are not improving.  You have a fever.  You have heat and inflammation over the involved bursa.  You have any other questions or concerns. MAKE SURE YOU:   Understand these instructions.  Will watch your condition.  Will get help right away if you are not doing well or get worse. Document Released: 04/13/2002 Document Revised: 01/14/2012 Document Reviewed: 11/10/2008 [facility] Patient Information 2015 [place], [place]. This information is not intended to replace advice given to you by your health care provider. Make sure you discuss any questions you have with your health care provider.

## 2015-04-26 NOTE — Assessment & Plan Note (Signed)
CT chest 2012:  RLL mass PET 2012: only mild uptake in RLL mass F/u ct arranged  >> No show  CT chest 2015:  Enlarging RLL mass, + mediastinal LN, ?satellite mets.   No evidence that this was a post obst pna and the fact she had go to ER and be admitted for what amts to a bronchopna suggests ACEi effects which have resolved (see hbp)  I had an extended discussion with the patient with Granddaughter Cassandra Walker reviewing all relevant studies completed to date and  Lasting 65m- Discussed in detail all the  indications, usual  risks and alternatives  relative to the benefits with patient who is leaning toward palliative care vs PET for tissue dx  .- Each maintenance medication was reviewed in detail including most importantly the difference between maintenance and as needed and under what circumstances the prns are to be used.  Please see instructions for details which were reviewed in writing and the patient given a copy.

## 2015-05-17 ENCOUNTER — Other Ambulatory Visit: Payer: Self-pay | Admitting: Internal Medicine

## 2015-05-17 ENCOUNTER — Other Ambulatory Visit (HOSPITAL_COMMUNITY): Payer: Self-pay | Admitting: Orthopaedic Surgery

## 2015-05-17 DIAGNOSIS — R05 Cough: Secondary | ICD-10-CM

## 2015-05-17 DIAGNOSIS — R059 Cough, unspecified: Secondary | ICD-10-CM

## 2015-05-17 DIAGNOSIS — R918 Other nonspecific abnormal finding of lung field: Secondary | ICD-10-CM

## 2015-05-17 MED ORDER — BENZONATATE 200 MG PO CAPS: ORAL_CAPSULE | ORAL | Status: DC

## 2015-05-23 ENCOUNTER — Ambulatory Visit (HOSPITAL_COMMUNITY)
Admission: RE | Admit: 2015-05-23 | Discharge: 2015-05-23 | Disposition: A | Discharge status: Home / Self Care | Payer: Medicare HMO | Source: Ambulatory Visit | Attending: Internal Medicine | Admitting: Internal Medicine

## 2015-05-23 ENCOUNTER — Other Ambulatory Visit: Payer: Self-pay | Admitting: Internal Medicine

## 2015-05-23 DIAGNOSIS — J929 Pleural plaque without asbestos: Secondary | ICD-10-CM | POA: Diagnosis not present

## 2015-05-23 DIAGNOSIS — J90 Pleural effusion, not elsewhere classified: Secondary | ICD-10-CM | POA: Insufficient documentation

## 2015-05-23 DIAGNOSIS — J439 Emphysema, unspecified: Secondary | ICD-10-CM | POA: Insufficient documentation

## 2015-05-23 DIAGNOSIS — R05 Cough: Secondary | ICD-10-CM | POA: Insufficient documentation

## 2015-05-23 DIAGNOSIS — R059 Cough, unspecified: Secondary | ICD-10-CM

## 2015-05-23 DIAGNOSIS — R918 Other nonspecific abnormal finding of lung field: Secondary | ICD-10-CM | POA: Insufficient documentation

## 2015-05-24 ENCOUNTER — Encounter: Payer: Self-pay | Admitting: Internal Medicine

## 2015-05-24 ENCOUNTER — Ambulatory Visit (INDEPENDENT_AMBULATORY_CARE_PROVIDER_SITE_OTHER): Payer: Medicare HMO | Admitting: Internal Medicine

## 2015-05-24 VITALS — BP 138/66 | HR 64 | Temp 97.7°F | Resp 16 | Ht 65.0 in | Wt 132.0 lb

## 2015-05-24 DIAGNOSIS — R05 Cough: Secondary | ICD-10-CM

## 2015-05-24 DIAGNOSIS — E119 Type 2 diabetes mellitus without complications: Secondary | ICD-10-CM

## 2015-05-24 DIAGNOSIS — Z6821 Body mass index (BMI) 21.0-21.9, adult: Secondary | ICD-10-CM

## 2015-05-24 DIAGNOSIS — R059 Cough, unspecified: Secondary | ICD-10-CM

## 2015-05-24 MED ORDER — BENZONATATE 100 MG PO CAPS: ORAL_CAPSULE | ORAL | Status: AC

## 2015-05-24 NOTE — Progress Notes (Signed)
   Subjective:    Patient ID: Cassandra Walker, female    DOB: 01-26-1932, 81 y.o.   MRN: [id number]  HPI  Very nice 81 yo WWF felt to have a RLL primary lung cancer predating discovery to 2012 and who has declined w/u & treatment has also been followed by Dr Melvyn Novas. Patient is brought in today by her GrandDaughter to review recent CXR done to r/o treatable cause of a persistent cough. CXR showed no significant change and no signs of PNA. Her GD expressed concerns over cost of Tessalon perles of over $100 at [facility] as  the Tessalon seemsto help suppress her persistent cough.   Meds, all, PMHx reviewed  Review of Systems 10 point systems review negative except as above.     Objective:   Physical Exam BP 138/66 mmHg  Pulse 64  Temp(Src) 97.7 F (36.5 C)  Resp 16  Ht '5\' 5"'$  (1.651 m)  Wt 132 lb (59.875 kg)  BMI 21.97 kg/m2  In No Distress & No formal exam done.    Assessment & Plan:   1. Cough  - benzonatate (TESSALON PERLES) 100 MG capsule; Take 1 to 2 perles 3 x day if needed for cough  Dispense: 90 capsule; Refill: 29 - reviewed with GD & found via "GoodRx" that Tessalon can be purchased much chealer at  [facility] with a coupon.

## 2015-05-25 ENCOUNTER — Other Ambulatory Visit: Payer: Self-pay | Admitting: Internal Medicine

## 2015-05-27 ENCOUNTER — Other Ambulatory Visit: Payer: Self-pay | Admitting: Internal Medicine

## 2015-06-05 ENCOUNTER — Emergency Department (HOSPITAL_COMMUNITY): Payer: Medicare HMO

## 2015-06-05 ENCOUNTER — Emergency Department (HOSPITAL_COMMUNITY)
Admission: EM | Admit: 2015-06-05 | Discharge: 2015-06-06 | Disposition: A | Discharge status: Home / Self Care | Payer: Medicare HMO | Attending: Emergency Medicine | Admitting: Emergency Medicine

## 2015-06-05 ENCOUNTER — Encounter (HOSPITAL_COMMUNITY): Payer: Self-pay

## 2015-06-05 DIAGNOSIS — K5900 Constipation, unspecified: Secondary | ICD-10-CM | POA: Insufficient documentation

## 2015-06-05 DIAGNOSIS — Z8639 Personal history of other endocrine, nutritional and metabolic disease: Secondary | ICD-10-CM | POA: Insufficient documentation

## 2015-06-05 DIAGNOSIS — Z86718 Personal history of other venous thrombosis and embolism: Secondary | ICD-10-CM | POA: Insufficient documentation

## 2015-06-05 DIAGNOSIS — Z85850 Personal history of malignant neoplasm of thyroid: Secondary | ICD-10-CM | POA: Insufficient documentation

## 2015-06-05 DIAGNOSIS — M545 Low back pain: Secondary | ICD-10-CM | POA: Diagnosis not present

## 2015-06-05 DIAGNOSIS — Z87891 Personal history of nicotine dependence: Secondary | ICD-10-CM | POA: Diagnosis not present

## 2015-06-05 DIAGNOSIS — R05 Cough: Secondary | ICD-10-CM | POA: Insufficient documentation

## 2015-06-05 DIAGNOSIS — Z85038 Personal history of other malignant neoplasm of large intestine: Secondary | ICD-10-CM | POA: Diagnosis not present

## 2015-06-05 DIAGNOSIS — M549 Dorsalgia, unspecified: Secondary | ICD-10-CM

## 2015-06-05 DIAGNOSIS — Z8701 Personal history of pneumonia (recurrent): Secondary | ICD-10-CM | POA: Diagnosis not present

## 2015-06-05 DIAGNOSIS — Z87828 Personal history of other (healed) physical injury and trauma: Secondary | ICD-10-CM | POA: Diagnosis not present

## 2015-06-05 DIAGNOSIS — I10 Essential (primary) hypertension: Secondary | ICD-10-CM | POA: Insufficient documentation

## 2015-06-05 DIAGNOSIS — Z853 Personal history of malignant neoplasm of breast: Secondary | ICD-10-CM | POA: Diagnosis not present

## 2015-06-05 DIAGNOSIS — Z8619 Personal history of other infectious and parasitic diseases: Secondary | ICD-10-CM | POA: Insufficient documentation

## 2015-06-05 DIAGNOSIS — G8929 Other chronic pain: Secondary | ICD-10-CM | POA: Diagnosis not present

## 2015-06-05 DIAGNOSIS — R053 Chronic cough: Secondary | ICD-10-CM

## 2015-06-05 DIAGNOSIS — Z8709 Personal history of other diseases of the respiratory system: Secondary | ICD-10-CM | POA: Insufficient documentation

## 2015-06-05 LAB — CBC WITH DIFFERENTIAL/PLATELET
Basophils Absolute: 0 10*3/uL (ref 0.0–0.1)
Basophils Relative: 0 % (ref 0–1)
EOS PCT: 1 % (ref 0–5)
Eosinophils Absolute: 0.2 10*3/uL (ref 0.0–0.7)
HCT: 32.3 % — ABNORMAL LOW (ref 36.0–46.0)
Hemoglobin: 10.2 g/dL — ABNORMAL LOW (ref 12.0–15.0)
LYMPHS ABS: 1.9 10*3/uL (ref 0.7–4.0)
LYMPHS PCT: 13 % (ref 12–46)
MCH: 26 pg (ref 26.0–34.0)
MCHC: 31.6 g/dL (ref 30.0–36.0)
MCV: 82.4 fL (ref 78.0–100.0)
MONOS PCT: 8 % (ref 3–12)
Monocytes Absolute: 1.2 10*3/uL — ABNORMAL HIGH (ref 0.1–1.0)
Neutro Abs: 11.4 10*3/uL — ABNORMAL HIGH (ref 1.7–7.7)
Neutrophils Relative %: 78 % — ABNORMAL HIGH (ref 43–77)
PLATELETS: 410 10*3/uL — AB (ref 150–400)
RBC: 3.92 MIL/uL (ref 3.87–5.11)
RDW: 14.8 % (ref 11.5–15.5)
WBC: 14.7 10*3/uL — AB (ref 4.0–10.5)

## 2015-06-05 LAB — I-STAT CHEM 8, ED
BUN: 27 mg/dL — ABNORMAL HIGH (ref 6–20)
CREATININE: 1.4 mg/dL — AB (ref 0.44–1.00)
Calcium, Ion: 1.51 mmol/L — ABNORMAL HIGH (ref 1.13–1.30)
Chloride: 99 mmol/L — ABNORMAL LOW (ref 101–111)
GLUCOSE: 125 mg/dL — AB (ref 65–99)
HCT: 34 % — ABNORMAL LOW (ref 36.0–46.0)
Hemoglobin: 11.6 g/dL — ABNORMAL LOW (ref 12.0–15.0)
POTASSIUM: 3.7 mmol/L (ref 3.5–5.1)
Sodium: 139 mmol/L (ref 135–145)
TCO2: 28 mmol/L (ref 0–100)

## 2015-06-05 MED ORDER — SODIUM CHLORIDE 0.9 % IV BOLUS (SEPSIS)
1000.0000 mL | Freq: Once | INTRAVENOUS | Status: AC
  Administered 2015-06-05: 1000 mL via INTRAVENOUS

## 2015-06-05 MED ORDER — OXYCODONE-ACETAMINOPHEN 5-325 MG PO TABS
1.0000 | ORAL_TABLET | Freq: Once | ORAL | Status: AC
Start: 2015-06-05 — End: 2015-06-05
  Administered 2015-06-05: 1 via ORAL
  Filled 2015-06-05: qty 1

## 2015-06-05 MED ORDER — AZITHROMYCIN 250 MG PO TABS: 250.0000 mg | ORAL_TABLET | Freq: Every day | ORAL | Status: DC

## 2015-06-05 NOTE — ED Notes (Signed)
Pt here for evaluation of ongoing cough x 1 month, coughing up white phlegm. Pt also reporting lower back pain, scheduled hip surgery on Friday Aug 12 with Dr. Rush Farmer. Pt denies CP, SOB, V/D but endorses nausea.

## 2015-06-05 NOTE — ED Provider Notes (Signed)
The pt is an elderly female with hx of mass in the lungs - she has refused further evaluation of this - she has a chronic cough - slightly worse recently - also has increased back pain over the last week and has not had a BM in same amount of time.    Has soft abd with minimal lower abd ttp, no tachycardia, no shorntess of breath - frequent mild coughing - no sputum.  No wheezing.  No edema, normal strength in bil LE's.  Has ttp over teh lower back  I had a long d/w family and pt about use of pain meds and constipation - she has onoly had miralax 1once in last week.  She may have further pathology in her back but she refuses further evaluation of that at this time - interestingly she is gettting a hip replacement in next couple of weeks.    She will be d/c and expresses understsanding that she may return for further evaluation at any time.  I saw and evaluated the patient, reviewed the resident's note and I agree with the findings and plan.    Final diagnoses:  Chronic cough  Chronic back pain  Constipation, unspecified constipation type       Noemi Chapel, MD 06/06/15 [phone]

## 2015-06-05 NOTE — Discharge Instructions (Signed)
Back Pain, Adult Back pain is very common. The pain often gets better over time. The cause of back pain is usually not dangerous. Most people can learn to manage their back pain on their own.  HOME CARE   Stay active. Start with short walks on flat ground if you can. Try to walk farther each day.  Do not sit, drive, or stand in one place for more than 30 minutes. Do not stay in bed.  Do not avoid exercise or work. Activity can help your back heal faster.  Be careful when you bend or lift an object. Bend at your knees, keep the object close to you, and do not twist.  Sleep on a firm mattress. Lie on your side, and bend your knees. If you lie on your back, put a pillow under your knees.  Only take medicines as told by your doctor.  Put ice on the injured area.  Put ice in a plastic bag.  Place a towel between your skin and the bag.  Leave the ice on for 15-20 minutes, 03-04 times a day for the first 2 to 3 days. After that, you can switch between ice and heat packs.  Ask your doctor about back exercises or massage.  Avoid feeling anxious or stressed. Find good ways to deal with stress, such as exercise. GET HELP RIGHT AWAY IF:   Your pain does not go away with rest or medicine.  Your pain does not go away in 1 week.  You have new problems.  You do not feel well.  The pain spreads into your legs.  You cannot control when you poop (bowel movement) or pee (urinate).  Your arms or legs feel weak or lose feeling (numbness).  You feel sick to your stomach (nauseous) or throw up (vomit).  You have belly (abdominal) pain.  You feel like you may pass out (faint). MAKE SURE YOU:   Understand these instructions.  Will watch your condition.  Will get help right away if you are not doing well or get worse. Document Released: 04/09/2008 Document Revised: 01/14/2012 Document Reviewed: 02/23/2014 [facility] Patient Information 2015 [place], [place]. This information is not intended  to replace advice given to you by your health care provider. Make sure you discuss any questions you have with your health care provider.  Cough, Adult  A cough is a reflex that helps clear your throat and airways. It can help heal the body or may be a reaction to an irritated airway. A cough may only last 2 or 3 weeks (acute) or may last more than 8 weeks (chronic).  CAUSES Acute cough:  Viral or bacterial infections. Chronic cough:  Infections.  Allergies.  Asthma.  Post-nasal drip.  Smoking.  Heartburn or acid reflux.  Some medicines.  Chronic lung problems (COPD).  Cancer. SYMPTOMS   Cough.  Fever.  Chest pain.  Increased breathing rate.  High-pitched whistling sound when breathing (wheezing).  Colored mucus that you cough up (sputum). TREATMENT   A bacterial cough may be treated with antibiotic medicine.  A viral cough must run its course and will not respond to antibiotics.  Your caregiver may recommend other treatments if you have a chronic cough. HOME CARE INSTRUCTIONS   Only take over-the-counter or prescription medicines for pain, discomfort, or fever as directed by your caregiver. Use cough suppressants only as directed by your caregiver.  Use a cold steam vaporizer or humidifier in your bedroom or home to help loosen secretions.  Sleep  in a semi-upright position if your cough is worse at night.  Rest as needed.  Stop smoking if you smoke. SEEK IMMEDIATE MEDICAL CARE IF:   You have pus in your sputum.  Your cough starts to worsen.  You cannot control your cough with suppressants and are losing sleep.  You begin coughing up blood.  You have difficulty breathing.  You develop pain which is getting worse or is uncontrolled with medicine.  You have a fever. MAKE SURE YOU:   Understand these instructions.  Will watch your condition.  Will get help right away if you are not doing well or get worse. Document Released: 04/20/2011  Document Revised: 01/14/2012 Document Reviewed: 04/20/2011 [facility] Patient Information 2015 [place], [place]. This information is not intended to replace advice given to you by your health care provider. Make sure you discuss any questions you have with your health care provider.

## 2015-06-05 NOTE — ED Notes (Signed)
MD at bedside. 

## 2015-06-05 NOTE — ED Provider Notes (Signed)
History   Chief Complaint  Patient presents with  . Cough    HPI 81 year old female past history as below notable for prior remote colon and thyroid cancer, peripheral vascular disease, remote DVT hx, known lung mass who presents ED accompanied by her granddaughter who is providing most of the history today for evaluation of acute on chronic back pain as well as chronic cough (years). Patient has had back pain for the past one month which has been evaluated by her PCP. Back pain is located in the middle of her lower back over her sacrum. Patient denies any trauma. PCP has prescribed Norco which is not helping. Patient reports significant worsening of pain with any movement or palpation. Patient denies any urinary incontinence or retention, weakness, numbness, tingling. She does report constipation since starting Norco however. Usually has BM every 3 days and now it has been 1 wk.  Additionally, patient has had cough for the last couple of years which is been getting worse over the last several weeks to the point where she will have fits and nearly vomit. Patient is on [employer] She has a known lung mass which the patient declined wanting to have further evaluated 3 years ago. Patient has not had any fevers, chills, abdominal pain, nausea, vomiting or other symptoms. No other complaints at this time.  Past medical/surgical history, social history, medications, allergies and FH have been reviewed with patient and/or in documentation. Furthermore, if pt family or friend(s) present, additional historical information was obtained from them.  Past Medical History  Diagnosis Date  . Osteoarthritis   . Hyperlipidemia   . GERD (gastroesophageal reflux disease)   . Peripheral neuropathy   . Osteopenia   . DDD (degenerative disc disease)   . Shingles   . Colon cancer 1977  . Thyroid cancer 1966  . Peripheral vascular disease   . Deep vein blood clot of left lower extremity 05/06/13   Past  Surgical History  Procedure Laterality Date  . Thyroidectomy    . Total abdominal hysterectomy w/ bilateral salpingoophorectomy  1968  . Colon resection  1977  . Cervical laminectomy  1995  . Cataract extraction      bilateral  . Breast enhancement surgery      silicone  . Breast surgery    . Posterior cervical fusion/foraminotomy N/A 03/07/2013    Procedure: Cervical four -seven POSTERIOR CERVICAL FUSION;  Surgeon: Elaina Hoops, MD;  Location: [facility];  Service: Neurosurgery;  Laterality: N/A;   Family History  Problem Relation Age of Onset  . Lung cancer Father    History  Substance Use Topics  . Smoking status: Former Smoker -- 1.00 packs/day for 20 years    Quit date: 11/05/1972  . Smokeless tobacco: Never Used  . Alcohol Use: No     Review of Systems Constitutional: - F/C, -fatigue.  HENT: - congestion, -rhinorrhea, -sore throat.   Eyes: - eye pain, -visual disturbance.  Respiratory: + cough, -SOB, -hemoptysis.   Cardiovascular: - CP, -palps.  Gastrointestinal: - N/V/D, -abd pain  Genitourinary: - flank pain, -dysuria, -frequency.  Musculoskeletal: - myalgia/arthritis, -joint swelling, -gait abnormality, +back pain, -neck pain/stiffness, -leg pain/swelling.  Skin: - rash/lesion.  Neurological: - focal weakness, -lightheadedness, -dizziness, -numbness, -HA.  All other systems reviewed and are negative.   Physical Exam  Physical Exam  ED Triage Vitals  Enc Vitals Group     BP 06/05/15 1931 151/75 mmHg     Pulse Rate 06/05/15 1931 92  Resp 06/05/15 1931 18     Temp 06/05/15 1931 98.3 F (36.8 C)     Temp Source 06/05/15 1931 Oral     SpO2 06/05/15 1931 96 %     Weight --      Height --      Head Cir --      Peak Flow --      Pain Score 06/05/15 1932 9     Pain Loc --      Pain Edu? --      Excl. in [place]? --    Constitutional: Patient is well appearing and in no acute distress Head: Normocephalic and atraumatic.  Eyes: Extraocular motion intact, no  scleral icterus Mouth: MMM, OP clear Neck: Supple without meningismus, mass, or overt JVD Respiratory: No respiratory distress. Normal WOB. No w/r/g. CV: RRR, no obvious murmurs.  Pulses +2 and symmetric. Euvolemic Abdomen: Soft, NT, ND, no r/g. No mass.  MSK: Back exam notable for mild TTP over sacrum and no TTP/step-off/deformity over entire T&L spine. Extremities are atraumatic without deformity, ROM intact Skin: Warm, dry, intact without rash Neuro: HDS, AAOx4. PERRL, EOMI, TML, face sym. CN 2-12 grossly intact. 5/5 sym, no drift, SILT, normal gait and coordination.   ED Course  Procedures   Labs Reviewed - No data to display I personally reviewed and interpreted all labs.  DG Chest 2 View  Final Result     I personally viewed above image(s) which were used in my medical decision making. Formal interpretations by Radiology.  MDM: ITHA KROEKER is a 81 y.o. female with H&P as above who p/w CC: Chronic cough and back pain.  Patient is hemodynamically stable and has a benign exam as above in ED today. Pt received screening labs and CXR for eval. Screening labs and x-rays of her back were ordered and all are unremarkable. CXR showed unchanged lung mass and no signs of PNA. Given rhonchi on exam and worsening cough, I offered CT scan to further evaluate for possible worsening mass/cancer.  Lengthy discussion with patient and her granddaughter regarding goals of care. Patient states she does not wish to have her chronic cough further evaluated for possible cancer or other acute abnormality today with CT scan. Patient states she just wants something for her cough and back pain. Patient had mild elevation in her creatinine and patient received a liter of normal saline in ED.    In regard to pt constipation, pt has a benign abdominal exam and there is low concern for impaction/SBO at this point based on her reassuring clinical picture. We had a lengthy discussion regarding proper MiraLAX  use. Patient was instructed to use MiraLAX daily until she had a normal bowel movement. Advised her to follow closely with her PCP regarding her back pain as she will likely need more advanced imaging. Additionally patient was given a prescription for a Z-Pak to cover for possible bronchitis versus other atypical infection.   Pt and granddaughter agree with plan and will f/u closely with PCP.  Old records reviewed (if available). Labs and imaging reviewed personally by myself and considered in medical decision making if ordered.  Clinical Impression: 1. Chronic cough   2. Chronic back pain   3. Constipation, unspecified constipation type     Disposition: Discharge  Condition: Good  I have discussed the results, Dx and Tx plan with the pt(& family if present). He/she/they expressed understanding and agree(s) with the plan. Discharge instructions discussed at great length. Strict  return precautions discussed and pt &/or family have verbalized understanding of the instructions. No further questions at time of discharge.    New Prescriptions   AZITHROMYCIN (ZITHROMAX) 250 MG TABLET    Take 1 tablet (250 mg total) by mouth daily. Take first 2 tablets together, then 1 every day until finished.    Follow Up: Unk Pinto, MD [address] [place] [address] [phone]  Schedule an appointment as soon as possible for a visit in 3 days   [place] [address] [id number] [place] [phone]  If symptoms worsen   Pt seen in conjunction with Dr. Noemi Chapel, MD   Kirstie Peri, [facility] Resident - PGY-3      Kirstie Peri, MD 06/06/15 0000  Noemi Chapel, MD 06/06/15 [phone]

## 2015-06-07 ENCOUNTER — Encounter (HOSPITAL_COMMUNITY): Payer: Self-pay

## 2015-06-07 ENCOUNTER — Encounter (HOSPITAL_COMMUNITY)
Admission: RE | Admit: 2015-06-07 | Discharge: 2015-06-07 | Disposition: A | Discharge status: Home / Self Care | Payer: Medicare HMO | Source: Ambulatory Visit | Attending: Orthopaedic Surgery | Admitting: Orthopaedic Surgery

## 2015-06-07 ENCOUNTER — Telehealth: Payer: Self-pay | Admitting: Internal Medicine

## 2015-06-07 DIAGNOSIS — Z01812 Encounter for preprocedural laboratory examination: Secondary | ICD-10-CM | POA: Insufficient documentation

## 2015-06-07 DIAGNOSIS — R918 Other nonspecific abnormal finding of lung field: Secondary | ICD-10-CM | POA: Diagnosis not present

## 2015-06-07 HISTORY — DX: Malignant neoplasm of unspecified site of unspecified female breast: C50.919

## 2015-06-07 HISTORY — DX: Nocturia: R35.1

## 2015-06-07 HISTORY — DX: Essential (primary) hypertension: I10

## 2015-06-07 HISTORY — DX: Fracture of neck, unspecified, initial encounter: S12.9XXA

## 2015-06-07 HISTORY — DX: Frequency of micturition: R35.0

## 2015-06-07 HISTORY — DX: Other disorders of lung: J98.4

## 2015-06-07 HISTORY — DX: Pneumonia, unspecified organism: J18.9

## 2015-06-07 HISTORY — DX: Hypothyroidism, unspecified: E03.9

## 2015-06-07 HISTORY — DX: Unspecified urinary incontinence: R32

## 2015-06-07 LAB — CBC
HCT: 33.5 % — ABNORMAL LOW (ref 36.0–46.0)
HEMOGLOBIN: 10.4 g/dL — AB (ref 12.0–15.0)
MCH: 25.8 pg — ABNORMAL LOW (ref 26.0–34.0)
MCHC: 31 g/dL (ref 30.0–36.0)
MCV: 83.1 fL (ref 78.0–100.0)
PLATELETS: 437 10*3/uL — AB (ref 150–400)
RBC: 4.03 MIL/uL (ref 3.87–5.11)
RDW: 14.8 % (ref 11.5–15.5)
WBC: 13.8 10*3/uL — AB (ref 4.0–10.5)

## 2015-06-07 LAB — BASIC METABOLIC PANEL
Anion gap: 7 (ref 5–15)
BUN: 19 mg/dL (ref 6–20)
CO2: 31 mmol/L (ref 22–32)
CREATININE: 1.02 mg/dL — AB (ref 0.44–1.00)
Calcium: 11.7 mg/dL — ABNORMAL HIGH (ref 8.9–10.3)
Chloride: 102 mmol/L (ref 101–111)
GFR calc non Af Amer: 51 mL/min — ABNORMAL LOW (ref >60)
GFR, EST AFRICAN AMERICAN: 59 mL/min — AB (ref >60)
GLUCOSE: 119 mg/dL — AB (ref 65–99)
POTASSIUM: 5.3 mmol/L — AB (ref 3.5–5.1)
SODIUM: 140 mmol/L (ref 135–145)

## 2015-06-07 LAB — PROTIME-INR
INR: 1.7 — ABNORMAL HIGH (ref 0.00–1.49)
Prothrombin Time: 20 seconds — ABNORMAL HIGH (ref 11.6–15.2)

## 2015-06-07 LAB — ABO/RH: ABO/RH(D): A POS

## 2015-06-07 LAB — SURGICAL PCR SCREEN
MRSA, PCR: NEGATIVE
Staphylococcus aureus: NEGATIVE

## 2015-06-07 LAB — APTT: aPTT: 43 seconds — ABNORMAL HIGH (ref 24–37)

## 2015-06-07 NOTE — Progress Notes (Addendum)
CBC PT/PTT and BMP results in epic per PAT visit 06/07/2015 sent to Dr Kathrynn Speed

## 2015-06-07 NOTE — Progress Notes (Signed)
Spoke with Dr Stevphen Rochester / anesthesia in regards to pts H&P, chest x-ray results, recent ER visit and EKG. Dr Oletta Lamas requesting clearance per Dr Minna Merritts and also for pt to have CXR upon arrival to Short Stay day of surgery. Order placed in epic. This nurse contacted Dr Gustavus Bryant office requesting clearance note. Spoke with [place].

## 2015-06-07 NOTE — Telephone Encounter (Signed)
Called made tonya aware. I called pt to scheduled appt. Spoke with pt brother Gershon Mussel. Appt was scheduled to see SN on Friday at 10:30AM 06/10/15 Per Tom he can bring pt to this appt. Tom wanted me to call pt Healthcare Power of attorney Asencion Partridge to let her know we scheduled pt appt at [phone] Called and [facility] x1

## 2015-06-07 NOTE — Telephone Encounter (Signed)
She is actually a clance pt and will need re-eval in office prior to being cleared - see if someone else can see her for this and if not ok to see as add on when I return

## 2015-06-07 NOTE — Telephone Encounter (Signed)
ATC Tonya, line busy x 2 WCB

## 2015-06-07 NOTE — Telephone Encounter (Signed)
Spoke with Cassandra Walker at [facility] presurgical testing  Pt having total hip replacement on 06/17/15  They are needing ok from Dr. Max Fickle aware that he is out of the office this wk and may not answer until then  Please advise thanks!

## 2015-06-07 NOTE — Telephone Encounter (Signed)
Asencion Partridge returned call. i informed her of this patient's appointment.  Nothing further was needed at this time.

## 2015-06-07 NOTE — Patient Instructions (Signed)
EVIE CROSTON  06/07/2015   Your procedure is scheduled on: Friday June 17, 2015   Report to [facility] Main  Entrance take [place]  elevators to 3rd floor to  [place] at 7:45 AM.  Call this number if you have problems the morning of surgery [phone]   Remember: ONLY 1 PERSON MAY GO WITH YOU TO SHORT STAY TO GET  READY MORNING OF [employer].  Do not eat food or drink liquids :After Midnight.     Take these medicines the morning of surgery with A SIP OF WATER: Atenolol (Tenormin); hydrocodone-acetaminophen if needed; Levothyroxine                                You may not have any metal on your body including hair pins and              piercings  Do not wear jewelry, make-up, lotions, powders or perfumes, deodorant             Do not wear nail polish.  Do not shave  48 hours prior to surgery.              Do not bring valuables to the hospital. [place].  Contacts, dentures or bridgework may not be worn into surgery.  Leave suitcase in the car. After surgery it may be brought to your room.      Please read over the following fact sheets you were given:MRSA INFORMATION SHEET  _____________________________________________________________________             [facility] - Preparing for Surgery Before surgery, you can play an important role.  Because skin is not sterile, your skin needs to be as free of germs as possible.  You can reduce the number of germs on your skin by washing with CHG (chlorahexidine gluconate) soap before surgery.  CHG is an antiseptic cleaner which kills germs and bonds with the skin to continue killing germs even after washing. Please DO NOT use if you have an allergy to CHG or antibacterial soaps.  If your skin becomes reddened/irritated stop using the CHG and inform your nurse when you arrive at Short Stay. Do not shave (including legs and underarms) for at least 48 hours  prior to the first CHG shower.  You may shave your face/neck. Please follow these instructions carefully:  1.  Shower with CHG Soap the night before surgery and the  morning of Surgery.  2.  If you choose to wash your hair, wash your hair first as usual with your  normal  shampoo.  3.  After you shampoo, rinse your hair and body thoroughly to remove the  shampoo.                           4.  Use CHG as you would any other liquid soap.  You can apply chg directly  to the skin and wash                       Gently with a scrungie or clean washcloth.  5.  Apply the CHG Soap to your body ONLY FROM THE NECK DOWN.  Do not use on face/ open                           Wound or open sores. Avoid contact with eyes, ears mouth and genitals (private parts).                       Wash face,  Genitals (private parts) with your normal soap.             6.  Wash thoroughly, paying special attention to the area where your surgery  will be performed.  7.  Thoroughly rinse your body with warm water from the neck down.  8.  DO NOT shower/wash with your normal soap after using and rinsing off  the CHG Soap.                9.  Pat yourself dry with a clean towel.            10.  Wear clean pajamas.            11.  Place clean sheets on your bed the night of your first shower and do not  sleep with pets. Day of Surgery : Do not apply any lotions/deodorants the morning of surgery.  Please wear clean clothes to the hospital/surgery center.  FAILURE TO FOLLOW THESE INSTRUCTIONS MAY RESULT IN THE CANCELLATION OF YOUR SURGERY PATIENT SIGNATURE_________________________________  NURSE SIGNATURE__________________________________  ________________________________________________________________________

## 2015-06-10 ENCOUNTER — Encounter: Payer: Self-pay | Admitting: Pulmonary Disease

## 2015-06-10 ENCOUNTER — Other Ambulatory Visit (INDEPENDENT_AMBULATORY_CARE_PROVIDER_SITE_OTHER): Payer: Medicare HMO

## 2015-06-10 ENCOUNTER — Ambulatory Visit (INDEPENDENT_AMBULATORY_CARE_PROVIDER_SITE_OTHER): Payer: Medicare HMO | Admitting: Pulmonary Disease

## 2015-06-10 VITALS — BP 132/74 | HR 83 | Temp 97.5°F | Wt 127.2 lb

## 2015-06-10 DIAGNOSIS — Z01818 Encounter for other preprocedural examination: Secondary | ICD-10-CM | POA: Diagnosis not present

## 2015-06-10 DIAGNOSIS — M545 Low back pain, unspecified: Secondary | ICD-10-CM

## 2015-06-10 DIAGNOSIS — M1612 Unilateral primary osteoarthritis, left hip: Secondary | ICD-10-CM | POA: Insufficient documentation

## 2015-06-10 DIAGNOSIS — M1990 Unspecified osteoarthritis, unspecified site: Secondary | ICD-10-CM

## 2015-06-10 DIAGNOSIS — R918 Other nonspecific abnormal finding of lung field: Secondary | ICD-10-CM | POA: Diagnosis not present

## 2015-06-10 DIAGNOSIS — M549 Dorsalgia, unspecified: Secondary | ICD-10-CM | POA: Insufficient documentation

## 2015-06-10 LAB — CANCER ANTIGEN 19-9: CA 19-9: 19 U/mL (ref <35.0)

## 2015-06-10 LAB — FERRITIN: FERRITIN: 255 ng/mL (ref 10.0–291.0)

## 2015-06-10 LAB — CBC WITH DIFFERENTIAL/PLATELET
Basophils Absolute: 0.1 10*3/uL (ref 0.0–0.1)
Basophils Relative: 0.4 % (ref 0.0–3.0)
EOS PCT: 0.1 % (ref 0.0–5.0)
Eosinophils Absolute: 0 10*3/uL (ref 0.0–0.7)
HCT: 32.1 % — ABNORMAL LOW (ref 36.0–46.0)
HEMOGLOBIN: 10.6 g/dL — AB (ref 12.0–15.0)
Lymphocytes Relative: 10 % — ABNORMAL LOW (ref 12.0–46.0)
Lymphs Abs: 1.5 10*3/uL (ref 0.7–4.0)
MCHC: 32.9 g/dL (ref 30.0–36.0)
MCV: 81.3 fl (ref 78.0–100.0)
Monocytes Absolute: 0.7 10*3/uL (ref 0.1–1.0)
Monocytes Relative: 4.8 % (ref 3.0–12.0)
Neutro Abs: 12.6 10*3/uL — ABNORMAL HIGH (ref 1.4–7.7)
Neutrophils Relative %: 84.7 % — ABNORMAL HIGH (ref 43.0–77.0)
PLATELETS: 465 10*3/uL — AB (ref 150.0–400.0)
RBC: 3.94 Mil/uL (ref 3.87–5.11)
RDW: 15.5 % (ref 11.5–15.5)
WBC: 14.8 10*3/uL — AB (ref 4.0–10.5)

## 2015-06-10 LAB — IBC PANEL
Iron: 24 ug/dL — ABNORMAL LOW (ref 42–145)
SATURATION RATIOS: 8.9 % — AB (ref 20.0–50.0)
Transferrin: 192 mg/dL — ABNORMAL LOW (ref 212.0–360.0)

## 2015-06-10 LAB — CEA: CEA: 25.3 ng/mL — ABNORMAL HIGH (ref 0.0–5.0)

## 2015-06-10 NOTE — Patient Instructions (Signed)
Today we updated your med list in our EPIC system...    Continue your current medications the same...  We discussed checking a BONE SCAN to evaluate your back pain & left hi[p arthritis...    We also checked some follow up blood work today...       We will contact you w/ the results when available...   I will call DrBlackman & discuss your situation w/ him before your planned surg next week...  Check w/ DrMcKeowan about your pain meds...  For your constipation try this>>    Start w/ MIRALAX one capful in water daily (around noon each day), and 2 SENAKOT-S tabs at bedtime...    If no result then increase the MIRALAX to twice daily & continue the SENAKOT-S...    If needed> add-in DULCOLX (you may take up to 4 tabs at bedtime) & use a DULCOLAX suppository in the AM...  Call for any questions.Marland KitchenMarland Kitchen

## 2015-06-11 ENCOUNTER — Encounter: Payer: Self-pay | Admitting: Pulmonary Disease

## 2015-06-11 NOTE — Progress Notes (Addendum)
Subjective:     Patient ID: Cassandra Walker, female   DOB: Sep 24, 1932, 81 y.o.   MRN: [id number]  HPI 81 y/o WF followed for pulmonary by DrWert & DrClance as outlined below>> her PCP= Dr. Unk Pinto... She is referred here today for a pre-operative pulm eval prior to planned right total hip surgery next week per DrBlackman>>      Right lung mass> review of prior CXRs and Scans shows this lesion was 1st evaluated in 2012 by DrClance, Bronch & bx was neg (nondiagnostic); she has regularly refused further eval & serial XRays and scans has shown progression w/ most recent CXR 06/05/15 showing right perihilar mass= 5.6cm; and last CTChest 05/27/14 showed mass in sup segm RLL ~5.4cm plus several other sm right lung nodules and hilar & mediastinal adenopathy (all progressed since prev scan 2012);  Note- she was a min smoker, 1ppd x20 yrs, and quit 1974...     Chronic cough> she is still on ACE inhibitor! Review in Epic shows she's been on ACE1 since before 2010; States cough was sl better w/ Tussionex & Tessalon...    Confusing hx in Epic, hard to sort out- hx colon cancer, ?hx breast lesion, ?x thyroid cancer> details unknown, several notes indicate that no one thinks she has any metastatic dis; Epic shows colonoscopy 03/2001 by DrEdwards which was NEG- hx Dukes B1 colon ca w/ distal sigmoid/ rectal border resection & anastamosis...    Chronic constipation> ?if this is due to her narcotics for severe back pain, or due to colon pathology? She hasn't seen GI in many yrs...    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    Severe low back pain> this is her CC today (it is not clear to me how long this has been going on); she points over the sacrum, & lower lumbar spine area; she says that prev injection in her back helped a little; currently taking Vicodin & OTC analgesics prn...     Hip arthritis> she is sched for right hip arthroplasty by  DrBlackman next week...    Hx DVT on Xarelto>   Note> Hosp 6/5- 04/14/15 by [facility] w/ cough & SOB, just finished Levaquin & Pred from PCP (min improvement); she was felt to have CAP- treated w/ Zithro/ Rocephin=> DC on Levaquin again; given Tussionex & Tessalon for cough, they held her Lisinopril but she restarted at DC; Hip XRays showed severe osteoarthritis w/ spurring on right Note> last OV w/ DrMcKeown 05/24/15 reviewed in Epic> in to check her due to cough, Tessalon was too $$ but helped some, he did not stop her Lisinopril as suggested by DrWert during his last visit 04/26/15; suggested she get the Tessalon at [facility] which was cheaper...  Note> she went to the ER 06/05/15 c/o acute on chronic back pain, chronic cough, and constipation; ER doc noted that she denied trauma and her PCP had evaluated the back pain & prescribed Norco but no relief- pain worse w/ movement & palpation; she declined f/u CTChest in the ER (given ZPak, has Tessalon), felt to have benign abd exam w/o concern for impaction (rec to use Miralax & f/u w/ her PCP for further eval- including advanced imaging of her back pain)... She has not yet had f/u w/ DrMcKeown as directed...   LABS 8/16:  CBC- anemic w/ Hg=10.1, MCV=81, Fe=24 (9%sat), CEA=25 (nl<5), Ca19-9= 19 (nl<35)...   ADDENDUM>>  Nuclear Bone  Scan done 06/15/15>> There are multiple small foci of increased activity throughout the skeleton; small foci are noted within the calvarium, shoulders, left-greater-than-right as well as the proximal left humerus, the thoracolumbar spine, multiple ribs, and the bones of the pelvis as well as the femurs right greater than left; these foci are consistent with diffuse bone metastases => hip surg cancelled by Larinda Buttery, PCP DrMcKeown made aware, pt/family notified & ASAP referral to Oncology being arranged...     Past Medical History  Diagnosis Date  . Osteoarthritis   . Hyperlipidemia   . Peripheral neuropathy   . Osteopenia   . DDD  (degenerative disc disease)   . Shingles   . Peripheral vascular disease   . Deep vein blood clot of left lower extremity 05/06/13  . Hypertension   . Pneumonia     hx of   . Hypothyroidism   . Urinary incontinence   . Urinary frequency   . Nocturia   . Broken neck     2 years from [place] accident   . Colon cancer 1977  . Thyroid cancer 1966  . Breast cancer     left   . Cavitating mass in right lower lung lobe     Past Surgical History  Procedure Laterality Date  . Thyroidectomy    . Total abdominal hysterectomy w/ bilateral salpingoophorectomy  1968  . Colon resection  1977  . Cervical laminectomy  1995  . Cataract extraction      bilateral  . Breast enhancement surgery      silicone  . Breast surgery    . Posterior cervical fusion/foraminotomy N/A 03/07/2013    Procedure: Cervical four -seven POSTERIOR CERVICAL FUSION;  Surgeon: Elaina Hoops, MD;  Location: [facility];  Service: Neurosurgery;  Laterality: N/A;  . Hand sugery       right hand secondary to lawnmower accident     Outpatient Encounter Prescriptions as of 06/10/2015  Medication Sig  . atenolol (TENORMIN) 25 MG tablet Take 25 mg by mouth daily.  Marland Kitchen azithromycin (ZITHROMAX) 250 MG tablet Take 1 tablet (250 mg total) by mouth daily. Take first 2 tablets together, then 1 every day until finished.  . benzonatate (TESSALON PERLES) 100 MG capsule Take 1 to 2 perles 3 x day if needed for cough  . Cholecalciferol (VITAMIN D3 HIGH POTENCY PO) Take 5,000 Units by mouth daily.  . Cyanocobalamin (B-12 PO) Take 1 tablet by mouth daily.  . ferrous sulfate 325 (65 FE) MG tablet Take 325 mg by mouth daily with breakfast.  . fish oil-omega-3 fatty acids 1000 MG capsule Take 1 g by mouth daily.   Marland Kitchen HYDROcodone-acetaminophen (NORCO) 5-325 MG per tablet Take 1 tablet by mouth 2 (two) times daily as needed for severe pain (for cough).  Marland Kitchen levothyroxine (SYNTHROID, LEVOTHROID) 75 MCG tablet Take 1 tablet (75 mcg total) by mouth  daily.  Marland Kitchen lisinopril (PRINIVIL,ZESTRIL) 20 MG tablet Take 20 mg by mouth daily.  . Magnesium 250 MG TABS Take 250 mg by mouth daily.  . polyethylene glycol powder (MIRALAX) powder Take 255 g by mouth once. (Patient taking differently: Take 17 g by mouth daily as needed for moderate constipation. )  . traMADol (ULTRAM) 50 MG tablet TAKE 1/2 TO 1 TABLET BY MOUTH THREE TIMES DAILY AS NEEDED FOR PAIN  . XARELTO 20 MG TABS tablet TAKE 1 TABLET BY MOUTH EVERY DAY FOR DVT   No facility-administered encounter medications on file as of 06/10/2015.  Allergies  Allergen Reactions  . Codeine Other (See Comments)    Jittery, nervous & hallucinations  . Oxybutynin     Tired    Current Medications, Allergies, Past Medical History, Past Surgical History, Family History, and Social History were reviewed in [employer] record.   Review of Systems           All symptoms NEG except where BOLDED >>  Constitutional:  F/C/S, fatigue, anorexia, unexpected weight change. HEENT:  HA, visual changes, hearing loss, earache, nasal symptoms, sore throat, mouth sores, hoarseness. Resp:  cough, sputum, hemoptysis; SOB, tightness, wheezing. Cardio:  CP, palpit, DOE, orthopnea, edema. GI:  N/V/D/C, blood in stool; reflux, abd pain, distention, gas. GU:  dysuria, freq, urgency, hematuria, flank pain, voiding difficulty. MS:  joint pain, swelling, tenderness, decr ROM; neck pain, back pain, etc. Neuro:  HA, tremors, seizures, dizziness, syncope, weakness, numbness, gait abn. Skin:  suspicious lesions or skin rash. Heme:  adenopathy, bruising, bleeding. Psyche:  confusion, agitation, sleep disturbance, hallucinations, anxiety, depression suicidal.   Objective:   Physical Exam      Vital Signs:  Reviewed...  General:  WD, thin, 81 y/o WF in NAD, chr ill appearing; alert & oriented; pleasant & cooperative... HEENT:  [place]/AT; Conjunctiva- pink, Sclera- nonicteric, EOM-wnl, PERRLA, EACs-clear,  TMs-wnl; NOSE-clear; THROAT-clear & wnl. Neck:  S/p surg w/ decrROM; no JVD; normal carotid impulses w/o bruits; no thyromegaly or nodules palpated; no lymphadenopathy. Chest:  decrBS bilat w/ few basilar rhonchi; without wheezes, rales, or signs of consolidation... Heart:  Regular Rhythm; gr1/6 SEM w/o rubs or gallops detected. Abdomen:  Soft & nontender- no guarding or rebound; decr bowel sounds; no organomegaly or masses palpated. Ext:  decrROM; +arthritic changes; no varicose veins, venous insuffic, or edema;  Pulses intact w/o bruits. Neuro:  She is quadraparetic from neck injury & subseq surg; able to stand w/ walker, manuevers around the house, uses wheelchair for any distances... Derm:  No lesions noted; no rash etc. Lymph:  No cervical, supraclavicular, axillary, or inguinal adenopathy palpated.   Assessment:      IMP >>      Pre-op eval -- she is sched for right THR next wk w/ DrBlackman, if indeed this needs to be done for pain & mobility issues then she should hold Xarelto x5d prior & the Ortho team should consult [facility] & poss Pulm/CCM in the post op period for peri-operative management... My major concern is that her CC today is clearly LBP=> over the sacrum & lower lumbar area and not in her hips;  In addition- she is quite limited in mobility due to her Cspine dis & surg, she is quadraparetic, able to stand & walk a few steps w/ walker...     Right lung mass -- unclear if this is lung primary or metastatic tumor, there are several other right lung nodules and adenopathy seen on CT scan 05/2014; she has repeatedly refused further eval but she will not be able to continue refusing care if she develops hemoptysis, chest wall invasion, etc... This lesion is slow growing (it's been followed for 4 yrs now)... If she will allow- needs repeat CT Chest...    Chronic cough -- likely due to the lung lesion but she is STILL on ACE1; rec to stop the Lisinopril, watch BPs & avoid ACE in future...     Confusing hx in Epic, hard to sort out- hx colon cancer, ?hx breast lesion, ?x thyroid cancer -- DrMcKeown will have to  sort this out...    Chronic constipation -- CT Abd & Pelvis would be helpful, note CEA level= 25! (?colon, lung?)... We reviewed Miralax, Senakot-S, Dulcolax OTC meds...    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    Hip arthritis -- she is sched for right THR next week...    Hx DVT on Xarelto -- she will need to HOLD the Xarelto 5d prior to the surgery  PLAN >>     As above -- she has agreed to nuclear bone scan to check for any metastatic dis in bones; she could benefit from CT CHEST/ ABD/ PELVIS, consider needle bx of lung mass to target therapy via Oncology but she declines all this; otherw needs end-of-life discussion and consideration of hospice for comfort care;  I discussed all this w/ pt & her brother in the office today- they indicated that grand daughter is the one in charge but she is not here today, they indicated that she absolutely wanted to be sure the pt got her surgery next week...     Plan:     Patient's Medications  New Prescriptions   No medications on file  Previous Medications   ATENOLOL (TENORMIN) 25 MG TABLET    Take 25 mg by mouth daily.   AZITHROMYCIN (ZITHROMAX) 250 MG TABLET    Take 1 tablet (250 mg total) by mouth daily. Take first 2 tablets together, then 1 every day until finished.   BENZONATATE (TESSALON PERLES) 100 MG CAPSULE    Take 1 to 2 perles 3 x day if needed for cough   CHOLECALCIFEROL (VITAMIN D3 HIGH POTENCY PO)    Take 5,000 Units by mouth daily.   CYANOCOBALAMIN (B-12 PO)    Take 1 tablet by mouth daily.   FERROUS SULFATE 325 (65 FE) MG TABLET    Take 325 mg by mouth daily with breakfast.   FISH OIL-OMEGA-3 FATTY ACIDS 1000 MG CAPSULE    Take 1 g by mouth daily.    HYDROCODONE-ACETAMINOPHEN (NORCO) 5-325 MG  PER TABLET    Take 1 tablet by mouth 2 (two) times daily as needed for severe pain (for cough).   LEVOTHYROXINE (SYNTHROID, LEVOTHROID) 75 MCG TABLET    Take 1 tablet (75 mcg total) by mouth daily.   LISINOPRIL (PRINIVIL,ZESTRIL) 20 MG TABLET              << STOP THE ACE INHIBITOR >>     MAGNESIUM 250 MG TABS    Take 250 mg by mouth daily.   POLYETHYLENE GLYCOL POWDER (MIRALAX) POWDER    Take 255 g by mouth once.   TRAMADOL (ULTRAM) 50 MG TABLET    TAKE 1/2 TO 1 TABLET BY MOUTH THREE TIMES DAILY AS NEEDED FOR PAIN   XARELTO 20 MG TABS TABLET    TAKE 1 TABLET BY MOUTH EVERY DAY FOR DVT   (HOLD FOR 5d prior to the SURG)  Modified Medications   No medications on file  Discontinued Medications   No medications on file

## 2015-06-15 ENCOUNTER — Ambulatory Visit (HOSPITAL_COMMUNITY)
Admission: RE | Admit: 2015-06-15 | Discharge: 2015-06-15 | Disposition: A | Discharge status: Home / Self Care | Payer: Medicare HMO | Source: Ambulatory Visit | Attending: Pulmonary Disease | Admitting: Pulmonary Disease

## 2015-06-15 ENCOUNTER — Encounter (HOSPITAL_COMMUNITY)
Admission: RE | Admit: 2015-06-15 | Discharge: 2015-06-15 | Disposition: A | Discharge status: Home / Self Care | Payer: Medicare HMO | Source: Ambulatory Visit | Attending: Pulmonary Disease | Admitting: Pulmonary Disease

## 2015-06-15 DIAGNOSIS — M1612 Unilateral primary osteoarthritis, left hip: Secondary | ICD-10-CM

## 2015-06-15 DIAGNOSIS — M545 Low back pain, unspecified: Secondary | ICD-10-CM

## 2015-06-15 DIAGNOSIS — Z85118 Personal history of other malignant neoplasm of bronchus and lung: Secondary | ICD-10-CM | POA: Diagnosis not present

## 2015-06-15 DIAGNOSIS — R938 Abnormal findings on diagnostic imaging of other specified body structures: Secondary | ICD-10-CM | POA: Diagnosis not present

## 2015-06-15 MED ORDER — TECHNETIUM TC 99M MEDRONATE IV KIT
26.9000 | PACK | Freq: Once | INTRAVENOUS | Status: AC | PRN
  Administered 2015-06-15: 26.9 via INTRAVENOUS

## 2015-06-16 ENCOUNTER — Telehealth: Payer: Self-pay | Admitting: Pulmonary Disease

## 2015-06-16 NOTE — Telephone Encounter (Signed)
Spoke with pt's granddaughter Asencion Partridge, states that pt had a bone density scan yesterday to see if she could proceed with her back surgery scheduled for tomorrow.  Pt's surgeon called pt's granddaughter today saying that they could not proceed with sx, but for results and reasoning behind it they should contact SN.   Results are in epic.    SN please advise on results/recs.  Thanks!

## 2015-06-17 ENCOUNTER — Inpatient Hospital Stay (HOSPITAL_COMMUNITY): Admission: RE | Admit: 2015-06-17 | Payer: Medicare HMO | Source: Ambulatory Visit | Admitting: Orthopaedic Surgery

## 2015-06-17 ENCOUNTER — Telehealth: Payer: Self-pay | Admitting: Pulmonary Disease

## 2015-06-17 ENCOUNTER — Other Ambulatory Visit: Payer: Self-pay | Admitting: Pulmonary Disease

## 2015-06-17 ENCOUNTER — Encounter (HOSPITAL_COMMUNITY): Admission: RE | Payer: Self-pay | Source: Ambulatory Visit

## 2015-06-17 DIAGNOSIS — R918 Other nonspecific abnormal finding of lung field: Secondary | ICD-10-CM

## 2015-06-17 LAB — TYPE AND SCREEN
ABO/RH(D): A POS
ANTIBODY SCREEN: NEGATIVE

## 2015-06-17 SURGERY — ARTHROPLASTY, HIP, TOTAL, ANTERIOR APPROACH
Anesthesia: Choice | Laterality: Right

## 2015-06-17 MED ORDER — HYDROCODONE-ACETAMINOPHEN 5-325 MG PO TABS: 1.0000 | ORAL_TABLET | Freq: Two times a day (BID) | ORAL | Status: DC | PRN

## 2015-06-17 NOTE — Telephone Encounter (Signed)
SN spoke with pt's granddaughter this AM about results  Per SN, Place urgent referral order for Oncology and contact granddaughter with appt date and time  Order has been placed

## 2015-06-17 NOTE — Telephone Encounter (Signed)
Patients granddaughter calling to see if she can have a prescription for Hydrocodone to last until she can get in to see the oncologist. Per SN - ok to give 30 tablets. RX printed and left at front to pick up Granddaughter will pick up this afternoon before closing. Nothing further needed.

## 2015-06-17 NOTE — Telephone Encounter (Signed)
Called & spoke with pt's granddaughter, Asencion Partridge.  I explained that we have to send these Referrals to [facility].  This was sent as an Urgent Referral this morning & they are working on it per a note in the Referral.  Granddaughter is aware someoen from [facility] will contact her with the appt.

## 2015-06-17 NOTE — Telephone Encounter (Signed)
Cassandra Walker calling to find out information regarding high priority referral to [facility] for [employer].  She has not heard anything from anyone and wants to know if an appointment has been scheduled yet.  I do not see that appointment has been scheduled, but I do see where [facility] has faxed information on referral.  [facility] - can you follow up on this?  Granddaughter is worried.  She said that she is going to call our office back around 4pm if she has not heard back before then.  She said that Dr. Lenna Gilford told her she would know something today.

## 2015-06-20 ENCOUNTER — Ambulatory Visit (HOSPITAL_BASED_OUTPATIENT_CLINIC_OR_DEPARTMENT_OTHER): Payer: Medicare HMO | Admitting: Internal Medicine

## 2015-06-20 ENCOUNTER — Encounter: Payer: Self-pay | Admitting: Internal Medicine

## 2015-06-20 ENCOUNTER — Ambulatory Visit: Payer: Medicare HMO

## 2015-06-20 ENCOUNTER — Telehealth: Payer: Self-pay | Admitting: Internal Medicine

## 2015-06-20 ENCOUNTER — Other Ambulatory Visit: Payer: Self-pay | Admitting: Internal Medicine

## 2015-06-20 ENCOUNTER — Other Ambulatory Visit (HOSPITAL_BASED_OUTPATIENT_CLINIC_OR_DEPARTMENT_OTHER): Payer: Medicare HMO

## 2015-06-20 ENCOUNTER — Ambulatory Visit (HOSPITAL_BASED_OUTPATIENT_CLINIC_OR_DEPARTMENT_OTHER): Payer: Medicare HMO

## 2015-06-20 VITALS — BP 139/70 | HR 108 | Temp 98.8°F | Resp 16 | Ht 65.5 in | Wt 132.7 lb

## 2015-06-20 DIAGNOSIS — R630 Anorexia: Secondary | ICD-10-CM

## 2015-06-20 DIAGNOSIS — E039 Hypothyroidism, unspecified: Secondary | ICD-10-CM

## 2015-06-20 DIAGNOSIS — R918 Other nonspecific abnormal finding of lung field: Secondary | ICD-10-CM

## 2015-06-20 DIAGNOSIS — C7951 Secondary malignant neoplasm of bone: Secondary | ICD-10-CM

## 2015-06-20 DIAGNOSIS — Z85038 Personal history of other malignant neoplasm of large intestine: Secondary | ICD-10-CM

## 2015-06-20 DIAGNOSIS — Z853 Personal history of malignant neoplasm of breast: Secondary | ICD-10-CM | POA: Diagnosis not present

## 2015-06-20 DIAGNOSIS — M549 Dorsalgia, unspecified: Secondary | ICD-10-CM | POA: Diagnosis not present

## 2015-06-20 DIAGNOSIS — R937 Abnormal findings on diagnostic imaging of other parts of musculoskeletal system: Secondary | ICD-10-CM | POA: Diagnosis not present

## 2015-06-20 DIAGNOSIS — Z86718 Personal history of other venous thrombosis and embolism: Secondary | ICD-10-CM

## 2015-06-20 LAB — COMPREHENSIVE METABOLIC PANEL (CC13)
ALBUMIN: 2.2 g/dL — AB (ref 3.5–5.0)
ALK PHOS: 112 U/L (ref 40–150)
ALT: 14 U/L (ref 0–55)
AST: 17 U/L (ref 5–34)
Anion Gap: 8 mEq/L (ref 3–11)
BUN: 22.5 mg/dL (ref 7.0–26.0)
CHLORIDE: 99 meq/L (ref 98–109)
CO2: 32 mEq/L — ABNORMAL HIGH (ref 22–29)
Calcium: 13.2 mg/dL (ref 8.4–10.4)
Creatinine: 1.1 mg/dL (ref 0.6–1.1)
EGFR: 49 mL/min/{1.73_m2} — ABNORMAL LOW (ref >90)
Glucose: 129 mg/dl (ref 70–140)
Potassium: 4.3 mEq/L (ref 3.5–5.1)
Sodium: 139 mEq/L (ref 136–145)
TOTAL PROTEIN: 6.5 g/dL (ref 6.4–8.3)
Total Bilirubin: 0.31 mg/dL (ref 0.20–1.20)

## 2015-06-20 LAB — CBC WITH DIFFERENTIAL/PLATELET
BASO%: 0.2 % (ref 0.0–2.0)
Basophils Absolute: 0 10*3/uL (ref 0.0–0.1)
EOS%: 0.4 % (ref 0.0–7.0)
Eosinophils Absolute: 0.1 10*3/uL (ref 0.0–0.5)
HEMATOCRIT: 30.6 % — AB (ref 34.8–46.6)
HEMOGLOBIN: 9.6 g/dL — AB (ref 11.6–15.9)
LYMPH#: 1.7 10*3/uL (ref 0.9–3.3)
LYMPH%: 8.9 % — ABNORMAL LOW (ref 14.0–49.7)
MCH: 26.3 pg (ref 25.1–34.0)
MCHC: 31.4 g/dL — ABNORMAL LOW (ref 31.5–36.0)
MCV: 83.8 fL (ref 79.5–101.0)
MONO#: 1.5 10*3/uL — AB (ref 0.1–0.9)
MONO%: 7.9 % (ref 0.0–14.0)
NEUT#: 15.9 10*3/uL — ABNORMAL HIGH (ref 1.5–6.5)
NEUT%: 82.6 % — ABNORMAL HIGH (ref 38.4–76.8)
Platelets: 495 10*3/uL — ABNORMAL HIGH (ref 145–400)
RBC: 3.65 10*6/uL — ABNORMAL LOW (ref 3.70–5.45)
RDW: 16.1 % — ABNORMAL HIGH (ref 11.2–14.5)
WBC: 19.3 10*3/uL — ABNORMAL HIGH (ref 3.9–10.3)

## 2015-06-20 MED ORDER — ZOLEDRONIC ACID 4 MG/100ML IV SOLN
4.0000 mg | Freq: Once | INTRAVENOUS | Status: AC
  Administered 2015-06-20: 4 mg via INTRAVENOUS
  Filled 2015-06-20: qty 100

## 2015-06-20 MED ORDER — METHYLPREDNISOLONE 4 MG PO TBPK: ORAL_TABLET | ORAL | Status: AC

## 2015-06-20 MED ORDER — OXYCODONE HCL ER 20 MG PO T12A: 20.0000 mg | EXTENDED_RELEASE_TABLET | Freq: Two times a day (BID) | ORAL | Status: AC

## 2015-06-20 NOTE — Telephone Encounter (Signed)
per pof to sch pt appt-gave pt copy of avs °

## 2015-06-20 NOTE — Telephone Encounter (Signed)
New patient appt-s/w patient granddtr Myrtie Cruise and gave np appt for 08/15 @ 1:45 w/Dr. Julien Nordmann.  Referring Dr. Teressa Lower Dx-Lung Mass

## 2015-06-20 NOTE — Progress Notes (Signed)
Checked in new pt with no financial concerns.  Pt has [facility]'s card for any billing questions, concerns or if financial assistance is needed.

## 2015-06-20 NOTE — Progress Notes (Signed)
[place] Telephone:([phone]) [phone]   [phone]  CONSULT NOTE  REFERRING PHYSICIAN: Dr. Teressa Lower  REASON FOR CONSULTATION:  81 years old white female with highly suspicious metastatic lung cancer.  HPI Cassandra Walker is a 81 y.o. 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 The patient also has a remote history of smoking but quit long time ago. The patient has a known right lower lobe lung mass seventh 2012 and at that time it measured 4.3 x 2.7 x 2.4 cm. She was seen by Dr. Raliegh Ip and at that time and a PET scan was performed on 07/30/2011 and it showed low level FDG uptake in the ill-defined lesion in the superior segment of the right lower lobe suspicious for malignancy but there was no mediastinal or hilar lymphadenopathy. It was recommended for the patient to proceed with bronchoscopy but she declined any further workup at that time. CT scan of the chest on 05/27/2014 showed enlargement of the superior segment right lower lobe mass which measured 5.4 x 3.5 cm compatible with carcinoma. There was also right lower lobe paratracheal lymph nodes measuring 1.2 cm and had enlarged in the interval. There was also the 0.9 x 2.0 cm subcarinal lymph node that has progressed slightly in addition to several small lung nodules on the right had progressed. There was also nodular opacity in the right medial lung base measuring 1.1 cm that was new and suspicious for metastatic disease. Again the patient refused any further investigation for her condition. Recently she has been complaining of worsening back pain as well as right hip pain she was scheduled to have right hip replacement. She was  referred to Dr. Lenna Gilford for clearance of her surgery. He ordered a whole body bone scan for evaluation of her back pain and it showed multiple small foci of increased activity throughout the skeleton. Small foci are noted within the calvarium,shoulders, left-greater-than-right as well as the proximal left humerus, the thoracolumbar spine, multiple ribs, and the bones of the pelvis as well as the femurs right greater than left. These foci are consistent with diffuse bone metastases. Dr. Lenna Gilford kindly referred the patient to me today for evaluation and recommendation regarding her condition. When seen today the patient continues to have right hip pain as well as low back pain. She has incontinence 2 urine and stool for the last 6 months or longer. She also continues to have cough productive of whitish sputum but no hemoptysis. She has shortness breath with exertion. The patient denied having any significant weight loss or night sweats. She has no nausea or vomiting. She denied having any headache or visual changes. Family history significant for mother with breast cancer and father with prostate cancer. The patient is a widow and has 4 children. She used to work for [employer]. She was accompanied today by her brother time and her granddaughter Asencion Partridge. The patient has remote history of smoking but quit long time ago. She has no history of alcohol or drug abuse. HPI  Past Medical History  Diagnosis Date  . Osteoarthritis   . Hyperlipidemia   . Peripheral neuropathy   . Osteopenia   . DDD (degenerative disc disease)   .  Shingles   . Peripheral vascular disease   . Deep vein blood clot of left lower extremity 05/06/13  . Hypertension   . Pneumonia     hx of   . Hypothyroidism   . Urinary incontinence   . Urinary frequency   . Nocturia   . Broken neck     2 years from [place] accident   . Colon cancer 1977  . Thyroid cancer 1966  . Breast cancer     left   . Cavitating mass in  right lower lung lobe     Past Surgical History  Procedure Laterality Date  . Thyroidectomy    . Total abdominal hysterectomy w/ bilateral salpingoophorectomy  1968  . Colon resection  1977  . Cervical laminectomy  1995  . Cataract extraction      bilateral  . Breast enhancement surgery      silicone  . Breast surgery    . Posterior cervical fusion/foraminotomy N/A 03/07/2013    Procedure: Cervical four -seven POSTERIOR CERVICAL FUSION;  Surgeon: Elaina Hoops, MD;  Location: [facility];  Service: Neurosurgery;  Laterality: N/A;  . Hand sugery       right hand secondary to lawnmower accident     Family History  Problem Relation Age of Onset  . Lung cancer Father     Social History Social History  Substance Use Topics  . Smoking status: Former Smoker -- 0.25 packs/day for 12 years    Types: Cigarettes    Quit date: 11/05/1994  . Smokeless tobacco: Never Used  . Alcohol Use: No    Allergies  Allergen Reactions  . Codeine Other (See Comments)    Jittery, nervous & hallucinations  . Oxybutynin     Tired    Current Outpatient Prescriptions  Medication Sig Dispense Refill  . atenolol (TENORMIN) 25 MG tablet Take 25 mg by mouth daily.  2  . benzonatate (TESSALON PERLES) 100 MG capsule Take 1 to 2 perles 3 x day if needed for cough 90 capsule 99  . calcium carbonate (OS-CAL) 600 MG TABS tablet Take 600 mg by mouth daily with breakfast.    . Cholecalciferol (VITAMIN D3 HIGH POTENCY PO) Take 5,000 Units by mouth daily.    . Cyanocobalamin (B-12 PO) Take 1 tablet by mouth daily.    . ferrous sulfate 325 (65 FE) MG tablet Take 325 mg by mouth daily with breakfast.    . fish oil-omega-3 fatty acids 1000 MG capsule Take 1 g by mouth daily.     Marland Kitchen HYDROcodone-acetaminophen (NORCO) 5-325 MG per tablet Take 1 tablet by mouth 2 (two) times daily as needed for severe pain (for cough). 30 tablet 0  . levothyroxine (SYNTHROID, LEVOTHROID) 75 MCG tablet Take 1 tablet (75 mcg total) by  mouth daily. 90 tablet 2  . lisinopril (PRINIVIL,ZESTRIL) 20 MG tablet Take 20 mg by mouth daily.    . Magnesium 250 MG TABS Take 250 mg by mouth daily.    . polyethylene glycol powder (MIRALAX) powder Take 255 g by mouth once. (Patient taking differently: Take 17 g by mouth daily as needed for moderate constipation. ) 255 g 0  . XARELTO 20 MG TABS tablet TAKE 1 TABLET BY MOUTH EVERY DAY FOR DVT 30 tablet 5  . methylPREDNISolone (MEDROL DOSEPAK) 4 MG TBPK tablet Use as instructed 21 tablet 0  . OxyCODONE (OXYCONTIN) 20 mg T12A 12 hr tablet Take 1 tablet (20 mg total) by mouth every 12 (twelve) hours. Cosmos  tablet 0  . traMADol (ULTRAM) 50 MG tablet TAKE 1/2 TO 1 TABLET BY MOUTH THREE TIMES DAILY AS NEEDED FOR PAIN (Patient not taking: Reported on 06/20/2015) 90 tablet 5   Current Facility-Administered Medications  Medication Dose Route Frequency Provider Last Rate Last Dose  . zolendronic acid (ZOMETA) 4 mg in sodium chloride 0.9 % 100 mL IVPB  4 mg Intravenous Once Curt Bears, MD        Review of Systems  Constitutional: positive for anorexia, fatigue and weight loss Eyes: negative Ears, nose, mouth, throat, and face: negative Respiratory: positive for cough, dyspnea on exertion and sputum Cardiovascular: negative Gastrointestinal: negative Genitourinary:positive for urinary incontinence Integument/breast: negative Hematologic/lymphatic: negative Musculoskeletal:positive for back pain, bone pain and muscle weakness Neurological: negative Behavioral/Psych: negative Endocrine: negative Allergic/Immunologic: negative  Physical Exam  ZTI:WPYKD, healthy, no distress, well nourished, well developed and ill looking SKIN: skin color, texture, turgor are normal, no rashes or significant lesions HEAD: Normocephalic, No masses, lesions, tenderness or abnormalities EYES: normal, PERRLA, Conjunctiva are pink and non-injected EARS: External ears normal, Canals clear OROPHARYNX:no exudate,  no erythema and lips, buccal mucosa, and tongue normal  NECK: supple, no adenopathy, no JVD LYMPH:  no palpable lymphadenopathy, no hepatosplenomegaly BREAST:not examined LUNGS: expiratory wheezes bilaterally, scattered rales bilaterally HEART: regular rate & rhythm, no murmurs and no gallops ABDOMEN:abdomen soft, non-tender, normal bowel sounds and no masses or organomegaly BACK: Back symmetric, no curvature., No CVA tenderness EXTREMITIES:no joint deformities, effusion, or inflammation, no skin discoloration  NEURO: alert & oriented x 3 with fluent speech, no focal motor/sensory deficits  PERFORMANCE STATUS: ECOG 2  LABORATORY DATA: Lab Results  Component Value Date   WBC 19.3* 06/20/2015   HGB 9.6* 06/20/2015   HCT 30.6* 06/20/2015   MCV 83.8 06/20/2015   PLT 495* 06/20/2015      Chemistry      Component Value Date/Time   NA 139 06/20/2015 1356   NA 140 06/07/2015 1030   K 4.3 06/20/2015 1356   K 5.3* 06/07/2015 1030   CL 102 06/07/2015 1030   CO2 32* 06/20/2015 1356   CO2 31 06/07/2015 1030   BUN 22.5 06/20/2015 1356   BUN 19 06/07/2015 1030   CREATININE 1.1 06/20/2015 1356   CREATININE 1.02* 06/07/2015 1030   CREATININE 1.15* 04/26/2015 1214      Component Value Date/Time   CALCIUM 13.2 Repeated and Verified* 06/20/2015 1356   CALCIUM 11.7* 06/07/2015 1030   ALKPHOS 112 06/20/2015 1356   ALKPHOS 69 04/26/2015 1214   AST 17 06/20/2015 1356   AST 17 04/26/2015 1214   ALT 14 06/20/2015 1356   ALT 11 04/26/2015 1214   BILITOT 0.31 06/20/2015 1356   BILITOT 0.3 04/26/2015 1214       RADIOGRAPHIC STUDIES: Dg Chest 2 View  06/05/2015   CLINICAL DATA:  Cough.  Short of breath.  Cough and wheezing.  EXAM: CHEST  2 VIEW  COMPARISON:  05/23/2015.  05/27/2014.  FINDINGS: Calcified bilateral breast implants degrade evaluation of the chest on the frontal view. The RIGHT hilar mass remains present and appears to measure about 5.6 cm x 3.1 cm. No definite interval change  from 05/23/2015. Chronic scarring with volume loss at the RIGHT lung base, likely secondary to cicatricial changes from the lung mass.  The cardiopericardial silhouette and mediastinal contours are within normal limits. RIGHT-greater-than-LEFT apical scarring. Posterior cervical spine fixation. Severe RIGHT AC joint osteoarthritis. Likely chronic proximal LEFT humerus fracture.  IMPRESSION: 1. No acute abnormality  or interval change. 2. RIGHT hilar mass appears similar to prior exams.   Electronically Signed   By: Dereck Ligas M.D.   On: 06/05/2015 20:08   Dg Chest 2 View  05/23/2015   CLINICAL DATA:  Chronic cough.  Emphysema.  Right lung mass.  EXAM: CHEST  2 VIEW  COMPARISON:  04/10/2015  FINDINGS: Hyperinflation again seen, consistent with COPD.  Spiculated mass in the central right lower lobe measuring approximately 4 x 6 cm shows no significant change. Mild bibasilar pleural thickening versus tiny bilateral pleural effusions noted. Bilateral breast implants with diffuse capsular calcification again noted.  IMPRESSION: No significant change in approximately 6 cm spiculated mass in the central right lower lobe, highly suspicious for bronchogenic carcinoma.  Emphysema.  Mild bibasilar pleural thickening versus tiny pleural effusions.   Electronically Signed   By: Earle Gell M.D.   On: 05/23/2015 17:24   Dg Pelvis 1-2 Views  06/05/2015   CLINICAL DATA:  Pelvic/sacral pain for 3 weeks.  EXAM: PELVIS - 1-2 VIEW  COMPARISON:  Pelvis radiograph 04/10/2015  FINDINGS: The cortical margins of the bony pelvis are intact. No displaced fracture. Severe degenerative change of the right hip, stable from prior. Pubic symphysis and sacroiliac joints are congruent. Mild degenerative change of both sacroiliac joints. Both femoral heads are well-seated in the respective acetabula.  IMPRESSION: 1. No evidence of acute pelvic fracture. 2. Severe degenerative change of the right hip.   Electronically Signed   By: Jeb Levering M.D.   On: 06/05/2015 22:19   Dg Sacrum/coccyx  06/05/2015   CLINICAL DATA:  Sacral pain for 3 weeks.  EXAM: SACRUM AND COCCYX - 2+ VIEW  COMPARISON:  Pelvis radiograph 04/10/2015  FINDINGS: No displaced sacrococcygeal fracture. The sacroiliac joints are congruent with mild degenerative change. Evaluation on the AP views is limited due to overlying stool and bowel gas. The bones are under mineralized.  IMPRESSION: No displaced sacrococcygeal fracture.   Electronically Signed   By: Jeb Levering M.D.   On: 06/05/2015 22:17   Nm Bone Scan Whole Body  06/15/2015   CLINICAL DATA:  History of lung carcinoma, now with low back pain for 2 months  EXAM: NUCLEAR MEDICINE WHOLE BODY BONE SCAN  TECHNIQUE: Whole body anterior and posterior images were obtained approximately 3 hours after intravenous injection of radiopharmaceutical.  RADIOPHARMACEUTICALS:  26.9 mCi Technetium-51mMDP IV  COMPARISON:  PET-CT of 07/30/2011  FINDINGS: There are multiple small foci of increased activity throughout the skeleton. Small foci are noted within the calvarium, shoulders, left-greater-than-right as well as the proximal left humerus, the thoracolumbar spine, multiple ribs, and the bones of the pelvis as well as the femurs right greater than left. These foci are consistent with diffuse bone metastases.  IMPRESSION: Multiple foci of increased activity consistent with diffuse bone metastases.   Electronically Signed   By: PIvar DrapeM.D.   On: 06/15/2015 15:27    ASSESSMENT: This is a very pleasant 81years old white female with highly suspicious metastatic non-small cell lung cancer probably adenocarcinoma based on the radiologic features. She has multiple metastatic bone lesions and her last CT scan of the chest was performed in July 2015.   PLAN: I had a lengthy discussion with the patient and her family today about her current disease status and treatment options. The patient is not interested in systemic  chemotherapy for her condition but she agreed to proceed with some investigation to confirm a diagnosis of her cancer  and also may consider palliative radiotherapy to the back based on her final pathology. I will arrange for the patient to have a PET scan performed within one week followed by CT guided core biopsy of the lung lesion or the most accessible soft tissue lesion seen on the PET scan. If the final diagnosis is consistent with adenocarcinoma, I may send the tissue block to be tested for molecular biomarker for consideration of targeted therapy. For the lack of appetite and back pain, I started the patient on Medrol Dosepak. For the back pain, I started the patient on OxyContin 20 mg by mouth every 12 hours and she will continue with Norco for breakthrough pain. I will refer the patient to radiation oncology for evaluation and discussion of palliative radiotherapy to her back. The patient would come back for follow-up visit in 2 weeks for reevaluation and discussion of her treatment options based on the final staging workup and pathology. For the hypercalcemia of malignancy, I will start the patient on Zometa 4 mg IV today and the patient was asked to discontinue her current calcium supplements. She was advised to call immediately if she has any concerning symptoms in the interval.  The patient voices understanding of current disease status and treatment options and is in agreement with the current care plan.  All questions were answered. The patient knows to call the clinic with any problems, questions or concerns. We can certainly see the patient much sooner if necessary.  Thank you so much for allowing me to participate in the care of Cassandra Walker. I will continue to follow up the patient with you and assist in her care.  I spent 40 minutes counseling the patient face to face. The total time spent in the appointment was 60 minutes.  Disclaimer: This note was dictated with voice  recognition software. Similar sounding words can inadvertently be transcribed and may not be corrected upon review.   Maurisio Ruddy K. June 20, 2015, 3:18 PM

## 2015-06-20 NOTE — Patient Instructions (Signed)

## 2015-06-20 NOTE — Progress Notes (Signed)
Faxed oxycontin pa form to [employer]

## 2015-06-21 ENCOUNTER — Encounter: Payer: Self-pay | Admitting: Internal Medicine

## 2015-06-21 NOTE — Progress Notes (Signed)
[employer] approved oxycontin from 11/03/14-11/05/15

## 2015-06-23 ENCOUNTER — Telehealth: Payer: Self-pay | Admitting: Medical Oncology

## 2015-06-23 NOTE — Telephone Encounter (Addendum)
Granddaughter, Cassandra Walker ,  reports pt having a lot of pain on oxycontin q 12 hours ( just started)  and hydrocodone 1 q 4 prn. Steroids also started. Pt took oxyconitn at 0600 . Granddaughter does report pt pain is better today than it was yesterday so she hopes her pain is getting better. Per Julien Nordmann I instructed [name] that she can  increase her oxycontin to every 8  hours if needed . Cassandra Walker preferred to leave pain med the same and wait to see how she does through the night . She will call me back in am with pain management update. She will need refill of hydrocodone for the weekend.

## 2015-06-24 ENCOUNTER — Other Ambulatory Visit: Payer: Self-pay | Admitting: Internal Medicine

## 2015-06-24 ENCOUNTER — Telehealth: Payer: Self-pay | Admitting: Medical Oncology

## 2015-06-24 ENCOUNTER — Other Ambulatory Visit: Payer: Self-pay | Admitting: Medical Oncology

## 2015-06-24 ENCOUNTER — Ambulatory Visit (HOSPITAL_COMMUNITY)
Admission: RE | Admit: 2015-06-24 | Discharge: 2015-06-24 | Disposition: A | Discharge status: Home / Self Care | Payer: Medicare HMO | Source: Ambulatory Visit | Attending: Internal Medicine | Admitting: Internal Medicine

## 2015-06-24 DIAGNOSIS — M545 Low back pain, unspecified: Secondary | ICD-10-CM

## 2015-06-24 DIAGNOSIS — R590 Localized enlarged lymph nodes: Secondary | ICD-10-CM | POA: Insufficient documentation

## 2015-06-24 DIAGNOSIS — Z853 Personal history of malignant neoplasm of breast: Secondary | ICD-10-CM | POA: Insufficient documentation

## 2015-06-24 DIAGNOSIS — Z981 Arthrodesis status: Secondary | ICD-10-CM | POA: Diagnosis not present

## 2015-06-24 DIAGNOSIS — Z85038 Personal history of other malignant neoplasm of large intestine: Secondary | ICD-10-CM | POA: Diagnosis not present

## 2015-06-24 DIAGNOSIS — N319 Neuromuscular dysfunction of bladder, unspecified: Secondary | ICD-10-CM

## 2015-06-24 DIAGNOSIS — R918 Other nonspecific abnormal finding of lung field: Secondary | ICD-10-CM

## 2015-06-24 DIAGNOSIS — G9589 Other specified diseases of spinal cord: Secondary | ICD-10-CM | POA: Diagnosis not present

## 2015-06-24 DIAGNOSIS — M4806 Spinal stenosis, lumbar region: Secondary | ICD-10-CM | POA: Insufficient documentation

## 2015-06-24 DIAGNOSIS — C7951 Secondary malignant neoplasm of bone: Secondary | ICD-10-CM | POA: Diagnosis not present

## 2015-06-24 DIAGNOSIS — M5489 Other dorsalgia: Secondary | ICD-10-CM

## 2015-06-24 DIAGNOSIS — J90 Pleural effusion, not elsewhere classified: Secondary | ICD-10-CM | POA: Diagnosis not present

## 2015-06-24 MED ORDER — HYDROCODONE-ACETAMINOPHEN 5-325 MG PO TABS: 1.0000 | ORAL_TABLET | ORAL | Status: AC | PRN

## 2015-06-24 MED ORDER — GADOBENATE DIMEGLUMINE 529 MG/ML IV SOLN
15.0000 mL | Freq: Once | INTRAVENOUS | Status: AC | PRN
  Administered 2015-06-24: 12 mL via INTRAVENOUS

## 2015-06-24 NOTE — Telephone Encounter (Addendum)
Cassandra Walker called and reported pt woke up in pain again and had a lot of  back pain through out the night . " Just take me away".  (  last OxyContin at 0600 and hydrocodone at 0900 today . [name]  did not give oxycontin q 8 hours yesterday ) I called for palliative care referral and will discuss pain management with [facility].

## 2015-06-24 NOTE — Telephone Encounter (Addendum)
Per Julien Nordmann , [facility] MRI ordered and Carmen instructed to give pt tramadol now.

## 2015-06-24 NOTE — Telephone Encounter (Addendum)
Pt getting MRI now . Carmen requests hydrocodone refill and I left message  to pick up rx now.  rx locked in injection.

## 2015-06-27 ENCOUNTER — Telehealth: Payer: Self-pay | Admitting: *Deleted

## 2015-06-27 ENCOUNTER — Telehealth: Payer: Self-pay | Admitting: Hematology

## 2015-06-27 NOTE — Telephone Encounter (Signed)
I received a phone call from the answering service that patient needs hospice referral. The recommendation was made by a nurse practitioner who saw her today. Chart review, appearing she was not seen by [place] today. I will defer the hospice referral to Dr. Julien Nordmann by tomorrow.  Truitt Merle  06/27/2015

## 2015-06-27 NOTE — Telephone Encounter (Signed)
Patient's grandaughter Myrtie Cruise called "to speak with Diane.  Would like to know if Hospice referral has been made and what do I need to do because I have not heard from Hospice yet.  I also have other things I'd like to discuss with her."  Aware that Diane is not at the desk today but ask to leave message.  Call transferred to collaborative.

## 2015-06-27 NOTE — Telephone Encounter (Signed)
Called Cassandra Walker to discuss her concerns. PER MD, MRI did not show Cord compression, proceed with PET and Biospy for a final diagnosis. Desk RN called in a Palliative Care referral Friday, Cassandra Walker was contacted by them today for an appt this afternoon at [facility] referra concern: per MD, PCP will need to make Hospice referral. Cassandra Walker advised she does not think pt is ready for Hospice at the moment, would like to proceed with PET, has concerns about biopsy and if pt will be able to tolerate it, will meet with Palliative today. No further concerns.

## 2015-06-28 ENCOUNTER — Telehealth: Payer: Self-pay | Admitting: *Deleted

## 2015-06-28 ENCOUNTER — Ambulatory Visit (HOSPITAL_COMMUNITY): Payer: Medicare HMO

## 2015-06-28 ENCOUNTER — Telehealth: Payer: Self-pay | Admitting: Internal Medicine

## 2015-06-28 NOTE — Telephone Encounter (Signed)
Call From Jocelyn Lamer at [facility], pt had visit from [place], NP with Palliative care 8/22 and has decided to go on Hospice.  Requesting order for hospice from MD. Reviewed with MD, and advised Jocelyn Lamer this will need to come from pt's PCP. POF to scheduling to cancel upcoming PET scan, Lab & MD visit. Jocelyn Lamer advised she will contact pt PCP for order. No further concerns.

## 2015-06-28 NOTE — Telephone Encounter (Signed)
Per pof pt appt cx due to pt going on hospice....done

## 2015-06-29 ENCOUNTER — Telehealth: Payer: Self-pay | Admitting: Internal Medicine

## 2015-06-29 NOTE — Telephone Encounter (Signed)
Cassandra Walker called to advise that patient was transferring from [facility] to Hospice, Dr Melford Aase to continue be attending provider.

## 2015-07-01 ENCOUNTER — Other Ambulatory Visit: Payer: Self-pay | Admitting: *Deleted

## 2015-07-01 MED ORDER — ALBUTEROL SULFATE HFA 108 (90 BASE) MCG/ACT IN AERS: INHALATION_SPRAY | RESPIRATORY_TRACT | Status: AC

## 2015-07-04 ENCOUNTER — Telehealth: Payer: Self-pay | Admitting: *Deleted

## 2015-07-04 MED ORDER — LORAZEPAM 0.5 MG PO TABS: 0.5000 mg | ORAL_TABLET | ORAL | Status: AC | PRN

## 2015-07-04 NOTE — Telephone Encounter (Signed)
Renee from Hospice called and reported patient's BP is 80/50 and she is taking Atenolol 25 mg twice a day.  OK to stop Atenolol and check patient's BP.  Also, Renee reported that the patient is very anxious and asked if Dr Melford Aase will send in an RX for Ativan.  OK to send in and Hospice aware.

## 2015-07-05 ENCOUNTER — Telehealth: Payer: Self-pay | Admitting: Internal Medicine

## 2015-07-05 ENCOUNTER — Other Ambulatory Visit: Payer: Self-pay

## 2015-07-05 ENCOUNTER — Ambulatory Visit: Payer: Self-pay | Admitting: Internal Medicine

## 2015-07-05 NOTE — Telephone Encounter (Signed)
returned call and s.w. pt and r/s missed appt....pt ok and aware

## 2015-07-10 ENCOUNTER — Other Ambulatory Visit: Payer: Self-pay | Admitting: Internal Medicine

## 2015-07-12 ENCOUNTER — Other Ambulatory Visit: Payer: Self-pay | Admitting: Internal Medicine

## 2015-08-02 ENCOUNTER — Other Ambulatory Visit: Payer: Self-pay | Admitting: *Deleted

## 2015-08-02 DIAGNOSIS — R918 Other nonspecific abnormal finding of lung field: Secondary | ICD-10-CM

## 2015-08-03 ENCOUNTER — Telehealth: Payer: Self-pay | Admitting: *Deleted

## 2015-08-03 ENCOUNTER — Ambulatory Visit: Payer: Self-pay | Admitting: Internal Medicine

## 2015-08-03 ENCOUNTER — Telehealth: Payer: Self-pay | Admitting: Internal Medicine

## 2015-08-03 ENCOUNTER — Other Ambulatory Visit: Payer: Self-pay

## 2015-08-03 NOTE — Telephone Encounter (Signed)
Pt's granddaughter called to cancel all apt's due to pt passed away 2015/07/12.Marland Kitchen... KJ

## 2015-08-03 NOTE — Telephone Encounter (Signed)
Kim from Scheduling advised pt's daughter called lmovm pt passed on 07-19-2023 pof to scheduling to cancel all appts

## 2015-08-04 ENCOUNTER — Ambulatory Visit: Payer: Self-pay | Admitting: Internal Medicine

## 2015-08-06 DEATH — deceased

## 2015-08-09 ENCOUNTER — Other Ambulatory Visit: Payer: Self-pay | Admitting: Internal Medicine

## 2015-11-23 ENCOUNTER — Encounter: Payer: Self-pay | Admitting: Physician Assistant

## 2016-05-11 IMAGING — NM NM BONE WHOLE BODY
2 series · 2 of 2 positions shown · non-contrast
Comparison: PET-CT of 07/30/2011

CLINICAL DATA: History of lung carcinoma, now with low back pain
for 2 months

EXAM:
NUCLEAR MEDICINE WHOLE BODY BONE SCAN
TECHNIQUE: Whole body anterior and posterior images were obtained approximately
3 hours after intravenous injection of radiopharmaceutical.
RADIOPHARMACEUTICALS:  26.9 mCi Bechnetium-VVm MDP IV

[Series 1: wbr_bone_40 whole body · 2.66mm/px · 1 of 1 slices shown (1 of 2)]
[im 1/1]
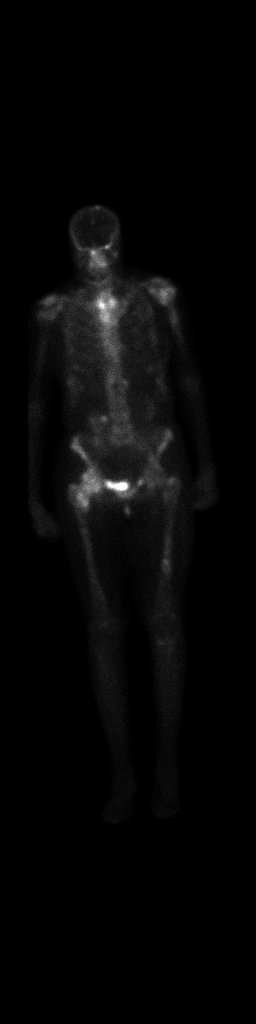

[Series 1: wbr_bone_40 whole body · 2.66mm/px · 1 of 1 slices shown (2 of 2)]
[im 1/1]
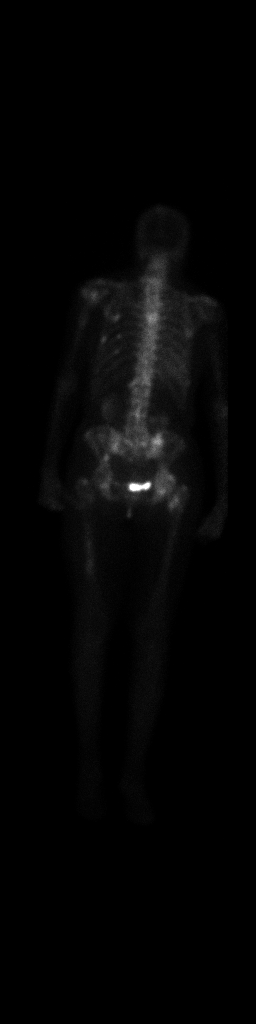

[2 of 2 positions shown; findings below may reference images not displayed]

FINDINGS: There are multiple small foci of increased activity throughout the
skeleton. Small foci are noted within the calvarium, shoulders,
left-greater-than-right as well as the proximal left humerus, the
thoracolumbar spine, multiple ribs, and the bones of the pelvis as
well as the femurs right greater than left. These foci are
consistent with diffuse bone metastases.
IMPRESSION: Multiple foci of increased activity consistent with diffuse bone
metastases.

## 2016-05-20 IMAGING — MR MR THORACIC SPINE WO/W CM
6 of 15 series · 17 of 48 positions shown · IV contrast (multihance)
Comparison: PET-CT 07/30/2011, chest radiographs 06/05/2015 and
bone scan 06/15/2015.

CLINICAL DATA: Mid to low back pain for 2 months. History of breast
and colon cancer. Right lung mass with osseous metastatic disease on
recent bone scan.

EXAM:
MRI THORACIC AND LUMBAR SPINE WITHOUT AND WITH CONTRAST
TECHNIQUE: Multiplanar and multiecho pulse sequences of the thoracic and lumbar
spine were obtained without and with intravenous contrast.
CONTRAST:  12mL MULTIHANCE GADOBENATE DIMEGLUMINE 529 MG/ML IV SOLN

[Series 6: T1 · sagittal · 3.0mm · 0.62mm/px · 1 of 14 slices shown (1 of 2)]
[im 1/14]
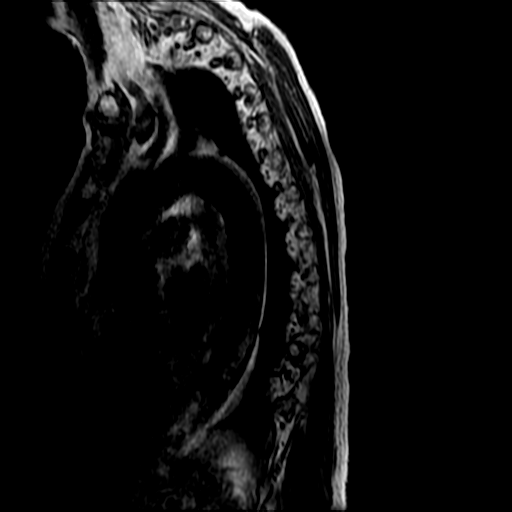

[Series 8: T2 · axial · 4.0mm · 0.39mm/px · z∈[-212,-22]mm · 5 of 36 slices shown (1 of 2)]
[im 1/36]
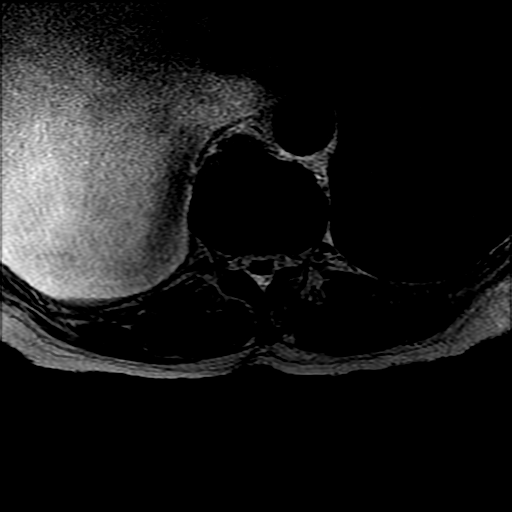
[im 9/36]
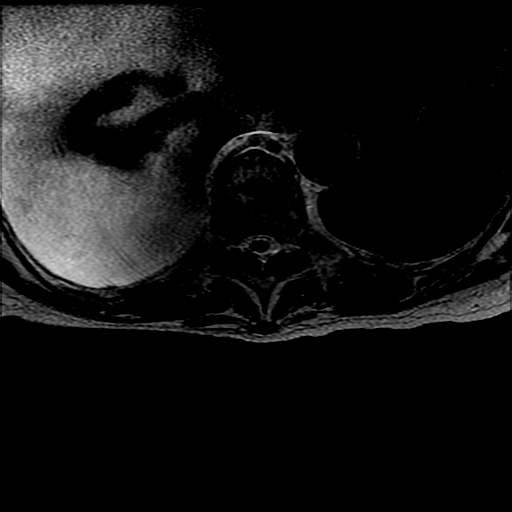
[im 18/36]
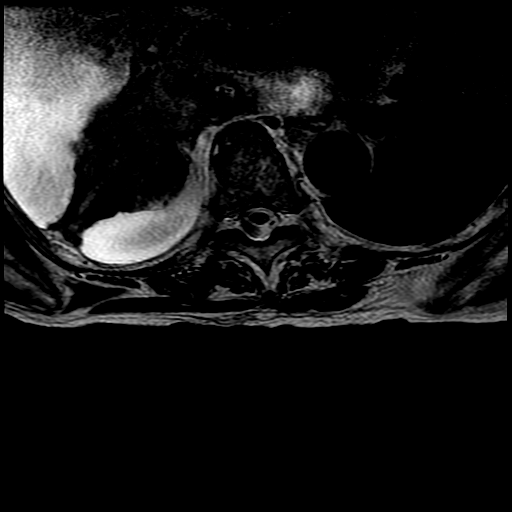
[im 27/36]
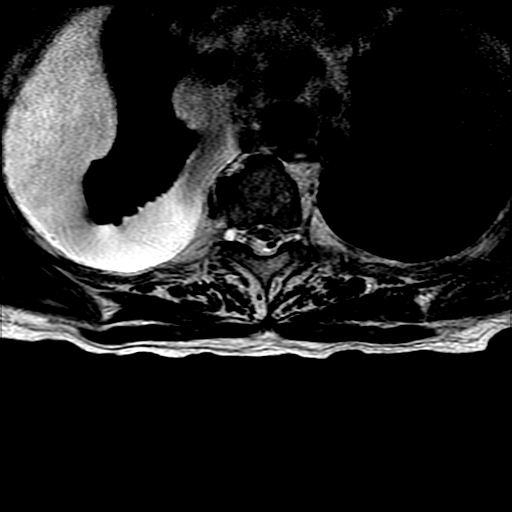
[im 36/36]
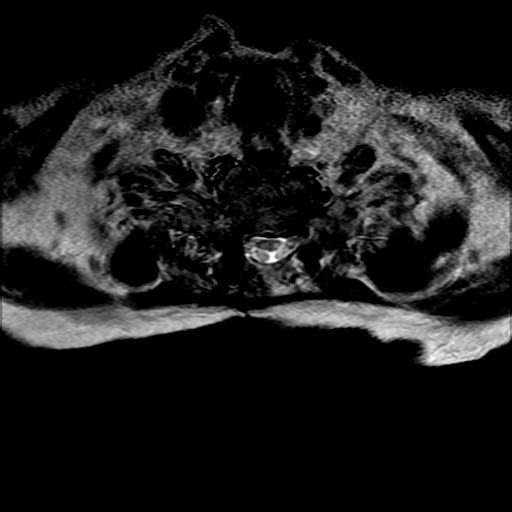

[Series 9: T1 · axial · non-contrast · 4.0mm · 0.39mm/px · 1 of 36 slices shown (2 of 2)]
[im 1/36]
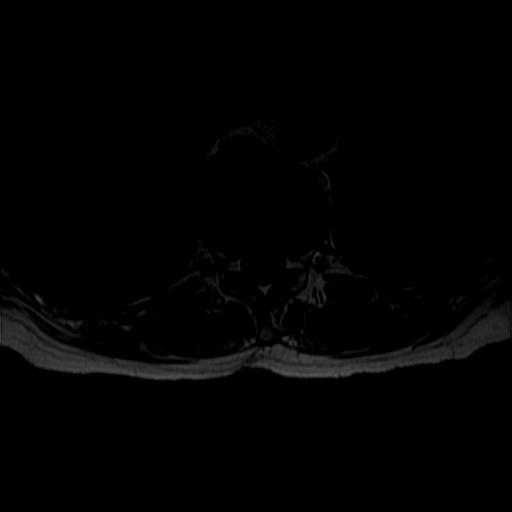

[Series 14: T2 · axial · 4.0mm · 0.39mm/px · z∈[-428,-211]mm · 6 of 40 slices shown (2 of 2)]
[im 1/40]
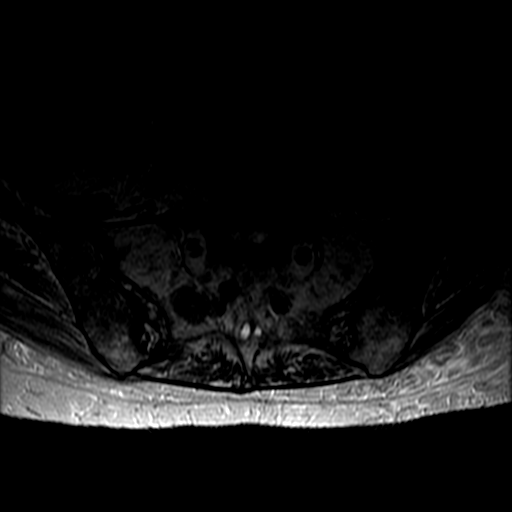
[im 8/40]
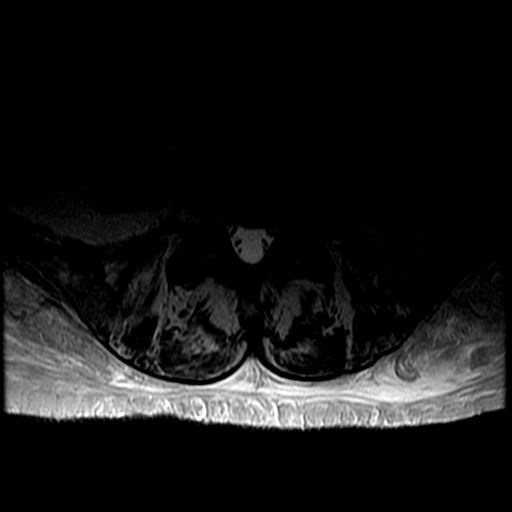
[im 16/40]
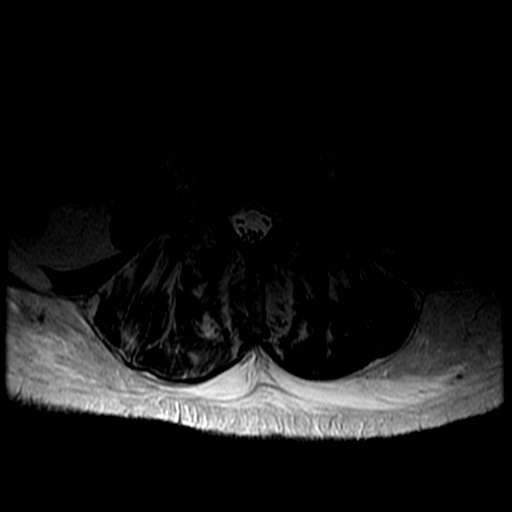
[im 24/40]
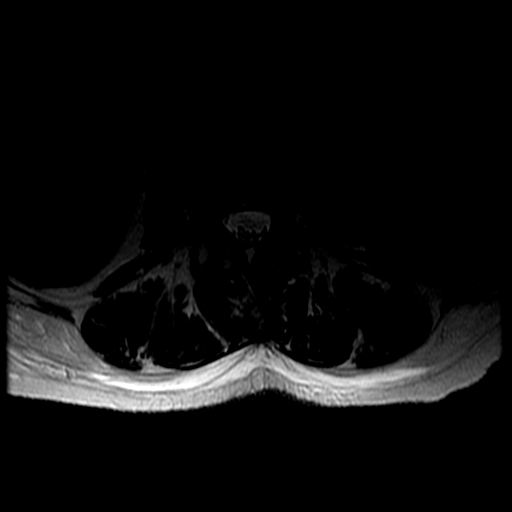
[im 32/40]
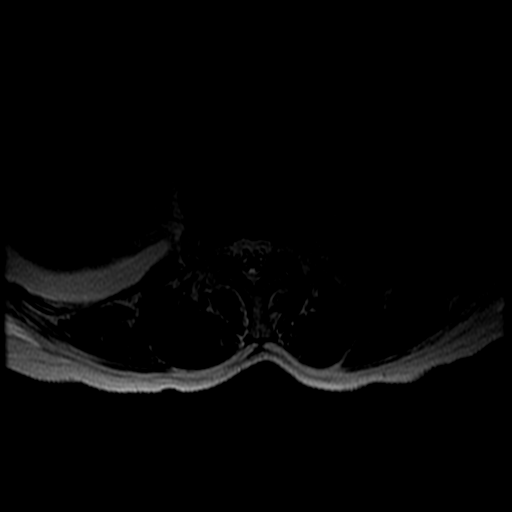
[im 40/40]
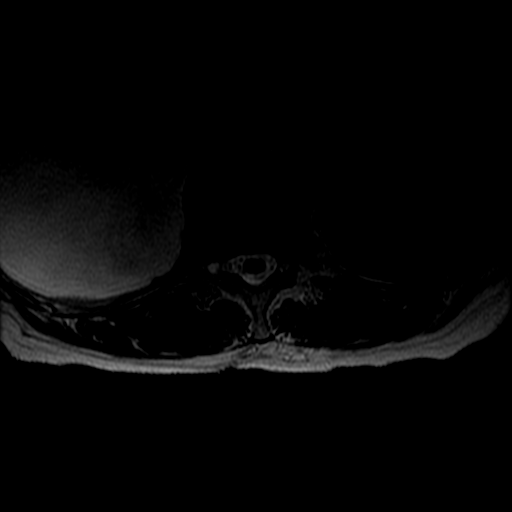

[Series 16: T2 post-contrast · sagittal · 4.0mm · 0.55mm/px · 2 of 12 slices shown (1 of 2)]
[im 1/12]
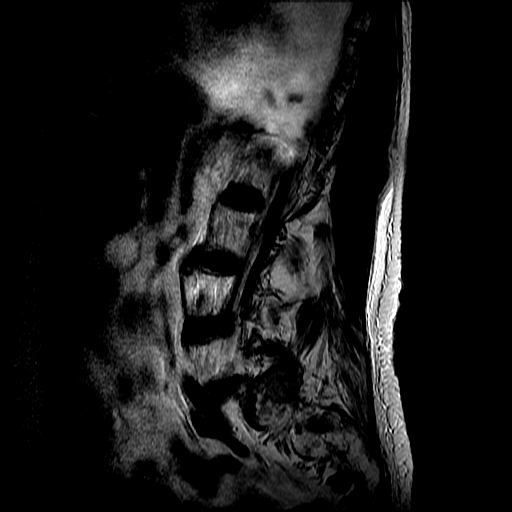
[im 12/12]
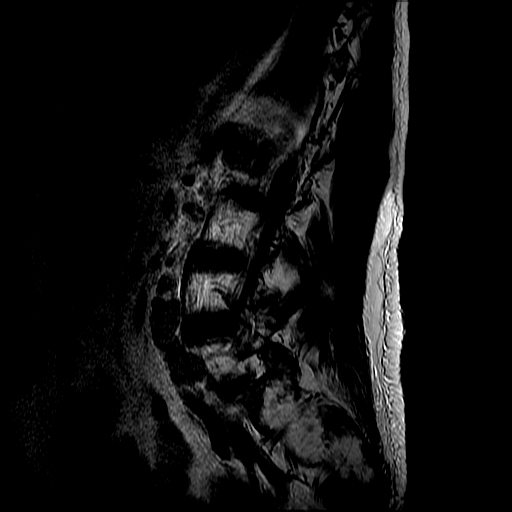

[Series 19: T2 post-contrast · sagittal · 3.0mm · 0.62mm/px · 2 of 14 slices shown (2 of 2)]
[im 1/14]
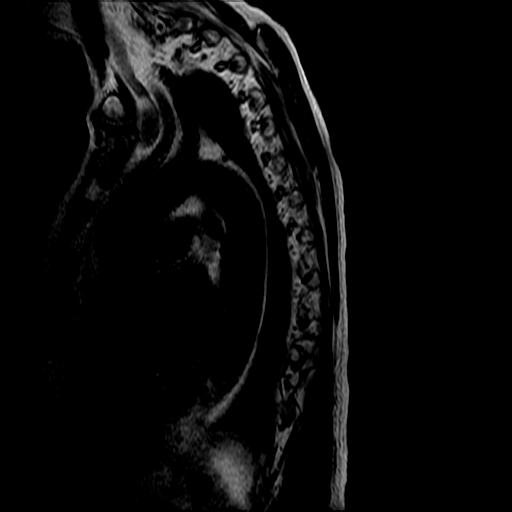
[im 14/14]
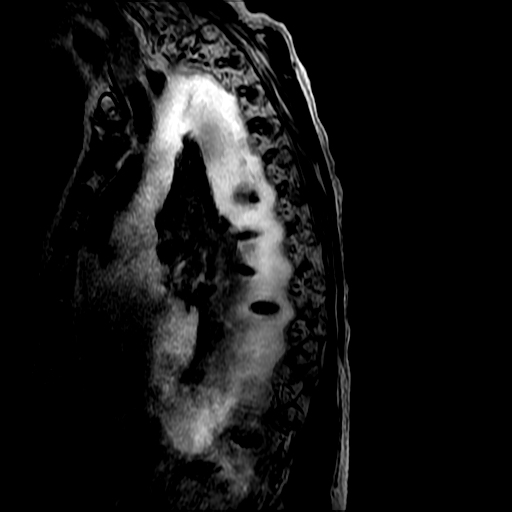

[17 of 48 positions shown; findings below may reference images not displayed]

FINDINGS: MR THORACIC SPINE FINDINGS

Sagittal localizing images of the cervical spine are limited by
artifact from lower cervical fusion appearing to extend from C4
through C7. Numbering of the thoracolumbar spine is based on this
assumption.

There is widespread enhancing metastatic disease throughout the
thoracic spine and posteromedial ribs. The largest lesions are
within the T7, T10 and T12 vertebral bodies. No evidence of
pathologic fracture or epidural tumor.

The thoracic alignment is normal. The thoracic spinal canal is
adequately patent throughout.

The thoracic cord is normal in signal and caliber. The conus
medullaris extends to the T12-L1 level. There is myelomalacia at
C6-7.

There is mild spondylosis throughout the thoracic spine with
anterior osteophytes. No cord deformity or foraminal compromise
identified.

There is a large complex partially loculated right pleural effusion,
likely malignant. There is near complete collapse of the right lung
with a central right hilar mass and multiple enlarged mediastinal
lymph nodes.

MR LUMBAR SPINE FINDINGS

As numbered, there are only 4 lumbar type vertebral bodies. Multiple
osseous metastases are present with near complete replacement of the
normal marrow signal in the L1 vertebral body. Lesions are also
present within the upper sacrum and iliac bones, worse on the right.
There is a mild superior endplate compression deformity at L1 which
may be pathologic. There is a small amount of anterior epidural
tumor at L1. There is no abnormal intradural enhancement.

There are multiple enlarged retroperitoneal lymph nodes, asymmetric
to the left. There is a small left adrenal nodule which may reflect
a metastasis.

L2-3: Mild disc bulging, facet and ligamentous hypertrophy
contribute to mild spinal stenosis.

L3-4: There is mild spinal stenosis secondary to annular disc
bulging, facet and ligamentous hypertrophy. Mild foraminal narrowing
is present bilaterally.

L4-S1: Mild bilateral facet hypertrophy. No spinal stenosis or nerve
root encroachment.
IMPRESSION: 1. Multiple osseous metastases throughout the visualized
thoracolumbar spine, ribs and upper pelvis. Possible mild superior
endplate pathologic fracture at L1 with minimal anterior epidural
tumor but no osseous retropulsion.
2. No evidence of cord compression or nerve root encroachment.
3. Previous lower cervical fusion with myelomalacia at C6-7.
4. Large right hilar mass, extensive mediastinal and retroperitoneal
lymphadenopathy on large complex right pleural effusion. Findings
are most consistent with metastatic lung cancer.
